# Patient Record
Sex: Female | Born: 1940
Health system: Southern US, Community
[De-identification: ages and names within clinical notes are randomized; demographics above are authoritative.]

## PROBLEM LIST (undated history)

## (undated) DIAGNOSIS — H269 Unspecified cataract: Secondary | ICD-10-CM

## (undated) DIAGNOSIS — N189 Chronic kidney disease, unspecified: Secondary | ICD-10-CM

## (undated) DIAGNOSIS — I10 Essential (primary) hypertension: Secondary | ICD-10-CM

## (undated) DIAGNOSIS — E785 Hyperlipidemia, unspecified: Secondary | ICD-10-CM

## (undated) DIAGNOSIS — T7840XA Allergy, unspecified, initial encounter: Secondary | ICD-10-CM

## (undated) DIAGNOSIS — D0590 Unspecified type of carcinoma in situ of unspecified breast: Secondary | ICD-10-CM

## (undated) DIAGNOSIS — J302 Other seasonal allergic rhinitis: Secondary | ICD-10-CM

## (undated) DIAGNOSIS — C50919 Malignant neoplasm of unspecified site of unspecified female breast: Secondary | ICD-10-CM

## (undated) DIAGNOSIS — N649 Disorder of breast, unspecified: Secondary | ICD-10-CM

## (undated) DIAGNOSIS — Z85819 Personal history of malignant neoplasm of unspecified site of lip, oral cavity, and pharynx: Secondary | ICD-10-CM

## (undated) DIAGNOSIS — Z9221 Personal history of antineoplastic chemotherapy: Secondary | ICD-10-CM

## (undated) DIAGNOSIS — Z9189 Other specified personal risk factors, not elsewhere classified: Secondary | ICD-10-CM

## (undated) DIAGNOSIS — C4490 Unspecified malignant neoplasm of skin, unspecified: Secondary | ICD-10-CM

## (undated) DIAGNOSIS — M1990 Unspecified osteoarthritis, unspecified site: Secondary | ICD-10-CM

## (undated) DIAGNOSIS — Z923 Personal history of irradiation: Secondary | ICD-10-CM

## (undated) HISTORY — PX: MOHS SURGERY: SHX181

## (undated) HISTORY — PX: TONSILLECTOMY AND ADENOIDECTOMY: SUR1326

## (undated) HISTORY — DX: Unspecified osteoarthritis, unspecified site: M19.90

## (undated) HISTORY — DX: Essential (primary) hypertension: I10

## (undated) HISTORY — DX: Personal history of malignant neoplasm of unspecified site of lip, oral cavity, and pharynx: Z85.819

## (undated) HISTORY — PX: TUBAL LIGATION: SHX77

## (undated) HISTORY — DX: Allergy, unspecified, initial encounter: T78.40XA

## (undated) HISTORY — PX: OTHER SURGICAL HISTORY: SHX169

## (undated) HISTORY — PX: BREAST EXCISIONAL BIOPSY: SUR124

## (undated) HISTORY — DX: Unspecified type of carcinoma in situ of unspecified breast: D05.90

## (undated) HISTORY — DX: Other specified personal risk factors, not elsewhere classified: Z91.89

## (undated) HISTORY — DX: Chronic kidney disease, unspecified: N18.9

## (undated) HISTORY — DX: Unspecified cataract: H26.9

## (undated) HISTORY — PX: DILATION AND CURETTAGE OF UTERUS: SHX78

## (undated) HISTORY — DX: Other seasonal allergic rhinitis: J30.2

## (undated) HISTORY — DX: Disorder of breast, unspecified: N64.9

## (undated) HISTORY — PX: BREAST SURGERY: SHX581

## (undated) HISTORY — DX: Personal history of antineoplastic chemotherapy: Z92.21

## (undated) HISTORY — DX: Unspecified malignant neoplasm of skin, unspecified: C44.90

## (undated) HISTORY — PX: ABDOMINAL HYSTERECTOMY: SHX81

---

## 1997-12-05 ENCOUNTER — Ambulatory Visit (HOSPITAL_COMMUNITY): Admission: RE | Admit: 1997-12-05 | Discharge: 1997-12-05 | Payer: Self-pay | Admitting: Obstetrics and Gynecology

## 1998-02-25 ENCOUNTER — Other Ambulatory Visit: Admission: RE | Admit: 1998-02-25 | Discharge: 1998-02-25 | Payer: Self-pay | Admitting: Obstetrics and Gynecology

## 1998-12-11 ENCOUNTER — Ambulatory Visit (HOSPITAL_COMMUNITY): Admission: RE | Admit: 1998-12-11 | Discharge: 1998-12-11 | Payer: Self-pay | Admitting: Internal Medicine

## 1998-12-17 ENCOUNTER — Encounter: Payer: Self-pay | Admitting: Internal Medicine

## 1998-12-17 ENCOUNTER — Ambulatory Visit (HOSPITAL_COMMUNITY): Admission: RE | Admit: 1998-12-17 | Discharge: 1998-12-17 | Payer: Self-pay | Admitting: Internal Medicine

## 1999-02-23 ENCOUNTER — Other Ambulatory Visit: Admission: RE | Admit: 1999-02-23 | Discharge: 1999-02-23 | Payer: Self-pay | Admitting: Obstetrics and Gynecology

## 1999-07-21 ENCOUNTER — Inpatient Hospital Stay (HOSPITAL_COMMUNITY): Admission: RE | Admit: 1999-07-21 | Discharge: 1999-07-22 | Payer: Self-pay | Admitting: Obstetrics and Gynecology

## 1999-07-21 ENCOUNTER — Encounter (INDEPENDENT_AMBULATORY_CARE_PROVIDER_SITE_OTHER): Payer: Self-pay

## 1999-12-23 ENCOUNTER — Ambulatory Visit (HOSPITAL_COMMUNITY): Admission: RE | Admit: 1999-12-23 | Discharge: 1999-12-23 | Payer: Self-pay | Admitting: Internal Medicine

## 1999-12-23 ENCOUNTER — Encounter: Payer: Self-pay | Admitting: Internal Medicine

## 2000-05-24 DIAGNOSIS — Z9221 Personal history of antineoplastic chemotherapy: Secondary | ICD-10-CM

## 2000-05-24 DIAGNOSIS — Z923 Personal history of irradiation: Secondary | ICD-10-CM

## 2000-05-24 DIAGNOSIS — D0590 Unspecified type of carcinoma in situ of unspecified breast: Secondary | ICD-10-CM

## 2000-05-24 HISTORY — PX: BREAST LUMPECTOMY: SHX2

## 2000-05-24 HISTORY — DX: Personal history of irradiation: Z92.3

## 2000-05-24 HISTORY — DX: Unspecified type of carcinoma in situ of unspecified breast: D05.90

## 2000-05-24 HISTORY — PX: BREAST BIOPSY: SHX20

## 2000-05-24 HISTORY — DX: Personal history of antineoplastic chemotherapy: Z92.21

## 2000-09-19 ENCOUNTER — Encounter: Payer: Self-pay | Admitting: Obstetrics and Gynecology

## 2000-09-19 ENCOUNTER — Encounter: Admission: RE | Admit: 2000-09-19 | Discharge: 2000-09-19 | Payer: Self-pay | Admitting: Obstetrics and Gynecology

## 2000-09-19 ENCOUNTER — Other Ambulatory Visit: Admission: RE | Admit: 2000-09-19 | Discharge: 2000-09-19 | Payer: Self-pay | Admitting: Obstetrics and Gynecology

## 2000-09-19 ENCOUNTER — Encounter (INDEPENDENT_AMBULATORY_CARE_PROVIDER_SITE_OTHER): Payer: Self-pay | Admitting: *Deleted

## 2000-09-23 ENCOUNTER — Encounter: Admission: RE | Admit: 2000-09-23 | Discharge: 2000-09-23 | Payer: Self-pay | Admitting: General Surgery

## 2000-09-23 ENCOUNTER — Encounter: Payer: Self-pay | Admitting: General Surgery

## 2000-09-26 ENCOUNTER — Encounter: Payer: Self-pay | Admitting: General Surgery

## 2000-09-26 ENCOUNTER — Ambulatory Visit (HOSPITAL_BASED_OUTPATIENT_CLINIC_OR_DEPARTMENT_OTHER): Admission: RE | Admit: 2000-09-26 | Discharge: 2000-09-26 | Payer: Self-pay | Admitting: General Surgery

## 2000-09-26 ENCOUNTER — Encounter (INDEPENDENT_AMBULATORY_CARE_PROVIDER_SITE_OTHER): Payer: Self-pay | Admitting: *Deleted

## 2000-10-05 ENCOUNTER — Ambulatory Visit: Admission: RE | Admit: 2000-10-05 | Discharge: 2000-12-21 | Payer: Self-pay | Admitting: Radiation Oncology

## 2000-11-02 ENCOUNTER — Encounter: Payer: Self-pay | Admitting: Oncology

## 2000-11-02 ENCOUNTER — Ambulatory Visit (HOSPITAL_COMMUNITY): Admission: RE | Admit: 2000-11-02 | Discharge: 2000-11-02 | Payer: Self-pay | Admitting: Oncology

## 2000-12-22 ENCOUNTER — Ambulatory Visit: Admission: RE | Admit: 2000-12-22 | Discharge: 2001-03-22 | Payer: Self-pay | Admitting: Radiation Oncology

## 2001-02-15 ENCOUNTER — Encounter: Admission: RE | Admit: 2001-02-15 | Discharge: 2001-02-15 | Payer: Self-pay | Admitting: Radiation Oncology

## 2001-02-16 ENCOUNTER — Encounter: Admission: RE | Admit: 2001-02-16 | Discharge: 2001-02-16 | Payer: Self-pay | Admitting: Radiation Oncology

## 2001-03-23 ENCOUNTER — Ambulatory Visit: Admission: RE | Admit: 2001-03-23 | Discharge: 2001-06-21 | Payer: Self-pay | Admitting: Radiation Oncology

## 2001-04-28 ENCOUNTER — Ambulatory Visit (HOSPITAL_COMMUNITY): Admission: RE | Admit: 2001-04-28 | Discharge: 2001-04-28 | Payer: Self-pay | Admitting: Oncology

## 2001-04-28 ENCOUNTER — Encounter: Payer: Self-pay | Admitting: Oncology

## 2001-10-19 LAB — HM COLONOSCOPY: HM Colonoscopy: NORMAL

## 2002-02-19 ENCOUNTER — Encounter: Admission: RE | Admit: 2002-02-19 | Discharge: 2002-02-19 | Payer: Self-pay | Admitting: Oncology

## 2002-02-19 ENCOUNTER — Encounter: Payer: Self-pay | Admitting: Oncology

## 2003-02-22 ENCOUNTER — Encounter: Admission: RE | Admit: 2003-02-22 | Discharge: 2003-02-22 | Payer: Self-pay | Admitting: Oncology

## 2003-02-22 ENCOUNTER — Encounter: Payer: Self-pay | Admitting: Oncology

## 2003-04-24 ENCOUNTER — Ambulatory Visit (HOSPITAL_COMMUNITY): Admission: RE | Admit: 2003-04-24 | Discharge: 2003-04-24 | Payer: Self-pay | Admitting: Oncology

## 2004-03-06 ENCOUNTER — Encounter: Admission: RE | Admit: 2004-03-06 | Discharge: 2004-03-06 | Payer: Self-pay | Admitting: General Surgery

## 2004-04-14 ENCOUNTER — Ambulatory Visit: Payer: Self-pay | Admitting: Internal Medicine

## 2004-06-10 ENCOUNTER — Ambulatory Visit: Payer: Self-pay | Admitting: Internal Medicine

## 2004-09-03 ENCOUNTER — Ambulatory Visit: Payer: Self-pay | Admitting: Internal Medicine

## 2004-09-15 ENCOUNTER — Ambulatory Visit: Payer: Self-pay | Admitting: Internal Medicine

## 2004-11-04 ENCOUNTER — Ambulatory Visit: Payer: Self-pay | Admitting: Internal Medicine

## 2004-12-14 ENCOUNTER — Ambulatory Visit: Payer: Self-pay | Admitting: Internal Medicine

## 2004-12-16 ENCOUNTER — Ambulatory Visit: Payer: Self-pay | Admitting: Internal Medicine

## 2004-12-18 ENCOUNTER — Ambulatory Visit: Payer: Self-pay | Admitting: Internal Medicine

## 2005-03-02 ENCOUNTER — Ambulatory Visit: Payer: Self-pay | Admitting: Oncology

## 2005-03-08 ENCOUNTER — Encounter: Admission: RE | Admit: 2005-03-08 | Discharge: 2005-03-08 | Payer: Self-pay | Admitting: Oncology

## 2005-03-18 ENCOUNTER — Encounter: Admission: RE | Admit: 2005-03-18 | Discharge: 2005-03-18 | Payer: Self-pay | Admitting: Oncology

## 2005-09-13 ENCOUNTER — Ambulatory Visit: Payer: Self-pay | Admitting: Internal Medicine

## 2005-09-16 ENCOUNTER — Ambulatory Visit: Payer: Self-pay | Admitting: Internal Medicine

## 2006-03-09 ENCOUNTER — Encounter: Admission: RE | Admit: 2006-03-09 | Discharge: 2006-03-09 | Payer: Self-pay | Admitting: General Surgery

## 2006-03-10 ENCOUNTER — Ambulatory Visit: Payer: Self-pay | Admitting: Internal Medicine

## 2006-03-10 ENCOUNTER — Ambulatory Visit: Payer: Self-pay | Admitting: Oncology

## 2006-03-14 ENCOUNTER — Ambulatory Visit (HOSPITAL_COMMUNITY): Admission: RE | Admit: 2006-03-14 | Discharge: 2006-03-14 | Payer: Self-pay | Admitting: Oncology

## 2006-03-14 ENCOUNTER — Ambulatory Visit: Payer: Self-pay | Admitting: Internal Medicine

## 2006-06-03 ENCOUNTER — Ambulatory Visit: Payer: Self-pay | Admitting: Oncology

## 2006-06-08 LAB — CBC WITH DIFFERENTIAL/PLATELET
BASO%: 0.5 % (ref 0.0–2.0)
Basophils Absolute: 0 10*3/uL (ref 0.0–0.1)
EOS%: 1.6 % (ref 0.0–7.0)
Eosinophils Absolute: 0.1 10*3/uL (ref 0.0–0.5)
HCT: 36.3 % (ref 34.8–46.6)
HGB: 12.4 g/dL (ref 11.6–15.9)
LYMPH%: 28.3 % (ref 14.0–48.0)
MCH: 31.3 pg (ref 26.0–34.0)
MCHC: 34.2 g/dL (ref 32.0–36.0)
MCV: 91.5 fL (ref 81.0–101.0)
MONO#: 0.5 10*3/uL (ref 0.1–0.9)
MONO%: 6.3 % (ref 0.0–13.0)
NEUT#: 4.9 10*3/uL (ref 1.5–6.5)
NEUT%: 63.3 % (ref 39.6–76.8)
Platelets: 274 10*3/uL (ref 145–400)
RBC: 3.97 10*6/uL (ref 3.70–5.32)
RDW: 13.2 % (ref 11.3–14.5)
WBC: 7.8 10*3/uL (ref 3.9–10.0)
lymph#: 2.2 10*3/uL (ref 0.9–3.3)

## 2006-06-09 LAB — COMPREHENSIVE METABOLIC PANEL
ALT: 10 U/L (ref 0–35)
AST: 14 U/L (ref 0–37)
Albumin: 4.4 g/dL (ref 3.5–5.2)
Alkaline Phosphatase: 49 U/L (ref 39–117)
BUN: 22 mg/dL (ref 6–23)
CO2: 25 mEq/L (ref 19–32)
Calcium: 9.5 mg/dL (ref 8.4–10.5)
Chloride: 108 mEq/L (ref 96–112)
Creatinine, Ser: 1.03 mg/dL (ref 0.40–1.20)
Glucose, Bld: 91 mg/dL (ref 70–99)
Potassium: 4 mEq/L (ref 3.5–5.3)
Sodium: 144 mEq/L (ref 135–145)
Total Bilirubin: 0.3 mg/dL (ref 0.3–1.2)
Total Protein: 6.9 g/dL (ref 6.0–8.3)

## 2006-06-09 LAB — CANCER ANTIGEN 27.29: CA 27.29: 37 U/mL (ref 0–39)

## 2006-07-14 ENCOUNTER — Ambulatory Visit: Payer: Self-pay | Admitting: Internal Medicine

## 2006-10-20 ENCOUNTER — Ambulatory Visit: Payer: Self-pay | Admitting: Internal Medicine

## 2007-02-14 ENCOUNTER — Ambulatory Visit: Payer: Self-pay | Admitting: Oncology

## 2007-02-16 LAB — CBC WITH DIFFERENTIAL/PLATELET
BASO%: 0.6 % (ref 0.0–2.0)
Basophils Absolute: 0 10*3/uL (ref 0.0–0.1)
EOS%: 2.6 % (ref 0.0–7.0)
Eosinophils Absolute: 0.2 10*3/uL (ref 0.0–0.5)
HCT: 36.5 % (ref 34.8–46.6)
HGB: 12.9 g/dL (ref 11.6–15.9)
LYMPH%: 29.7 % (ref 14.0–48.0)
MCH: 31.7 pg (ref 26.0–34.0)
MCHC: 35.4 g/dL (ref 32.0–36.0)
MCV: 89.5 fL (ref 81.0–101.0)
MONO#: 0.6 10*3/uL (ref 0.1–0.9)
MONO%: 8.7 % (ref 0.0–13.0)
NEUT#: 3.8 10*3/uL (ref 1.5–6.5)
NEUT%: 58.4 % (ref 39.6–76.8)
Platelets: 256 10*3/uL (ref 145–400)
RBC: 4.07 10*6/uL (ref 3.70–5.32)
RDW: 13.3 % (ref 11.3–14.5)
WBC: 6.5 10*3/uL (ref 3.9–10.0)
lymph#: 1.9 10*3/uL (ref 0.9–3.3)

## 2007-02-16 LAB — COMPREHENSIVE METABOLIC PANEL
ALT: 9 U/L (ref 0–35)
AST: 13 U/L (ref 0–37)
Albumin: 4.1 g/dL (ref 3.5–5.2)
Alkaline Phosphatase: 59 U/L (ref 39–117)
BUN: 17 mg/dL (ref 6–23)
CO2: 25 mEq/L (ref 19–32)
Calcium: 9.6 mg/dL (ref 8.4–10.5)
Chloride: 106 mEq/L (ref 96–112)
Creatinine, Ser: 0.96 mg/dL (ref 0.40–1.20)
Glucose, Bld: 80 mg/dL (ref 70–99)
Potassium: 4.2 mEq/L (ref 3.5–5.3)
Sodium: 140 mEq/L (ref 135–145)
Total Bilirubin: 0.5 mg/dL (ref 0.3–1.2)
Total Protein: 6.7 g/dL (ref 6.0–8.3)

## 2007-02-16 LAB — CANCER ANTIGEN 27.29: CA 27.29: 31 U/mL (ref 0–39)

## 2007-02-22 ENCOUNTER — Encounter: Payer: Self-pay | Admitting: Internal Medicine

## 2007-02-22 ENCOUNTER — Ambulatory Visit: Payer: Self-pay | Admitting: Internal Medicine

## 2007-02-22 DIAGNOSIS — B3784 Candidal otitis externa: Secondary | ICD-10-CM | POA: Insufficient documentation

## 2007-03-13 ENCOUNTER — Encounter: Admission: RE | Admit: 2007-03-13 | Discharge: 2007-03-13 | Payer: Self-pay | Admitting: Oncology

## 2007-03-17 ENCOUNTER — Encounter: Admission: RE | Admit: 2007-03-17 | Discharge: 2007-03-17 | Payer: Self-pay | Admitting: Oncology

## 2007-06-05 ENCOUNTER — Encounter: Payer: Self-pay | Admitting: *Deleted

## 2007-06-05 DIAGNOSIS — N840 Polyp of corpus uteri: Secondary | ICD-10-CM | POA: Insufficient documentation

## 2007-06-05 DIAGNOSIS — D0590 Unspecified type of carcinoma in situ of unspecified breast: Secondary | ICD-10-CM | POA: Insufficient documentation

## 2007-06-05 DIAGNOSIS — Z9089 Acquired absence of other organs: Secondary | ICD-10-CM | POA: Insufficient documentation

## 2007-06-05 DIAGNOSIS — Z85819 Personal history of malignant neoplasm of unspecified site of lip, oral cavity, and pharynx: Secondary | ICD-10-CM

## 2007-06-05 DIAGNOSIS — Z9889 Other specified postprocedural states: Secondary | ICD-10-CM | POA: Insufficient documentation

## 2007-06-05 DIAGNOSIS — Z862 Personal history of diseases of the blood and blood-forming organs and certain disorders involving the immune mechanism: Secondary | ICD-10-CM | POA: Insufficient documentation

## 2007-06-05 DIAGNOSIS — N809 Endometriosis, unspecified: Secondary | ICD-10-CM | POA: Insufficient documentation

## 2007-06-05 DIAGNOSIS — N3289 Other specified disorders of bladder: Secondary | ICD-10-CM | POA: Insufficient documentation

## 2007-06-05 DIAGNOSIS — Z9189 Other specified personal risk factors, not elsewhere classified: Secondary | ICD-10-CM | POA: Insufficient documentation

## 2007-06-05 DIAGNOSIS — Z8639 Personal history of other endocrine, nutritional and metabolic disease: Secondary | ICD-10-CM

## 2007-06-05 HISTORY — DX: Personal history of malignant neoplasm of unspecified site of lip, oral cavity, and pharynx: Z85.819

## 2007-12-18 ENCOUNTER — Ambulatory Visit: Payer: Self-pay | Admitting: Internal Medicine

## 2007-12-18 DIAGNOSIS — R234 Changes in skin texture: Secondary | ICD-10-CM | POA: Insufficient documentation

## 2008-03-14 ENCOUNTER — Encounter: Admission: RE | Admit: 2008-03-14 | Discharge: 2008-03-14 | Payer: Self-pay | Admitting: Oncology

## 2008-03-19 ENCOUNTER — Ambulatory Visit: Payer: Self-pay | Admitting: Oncology

## 2008-03-21 LAB — CBC WITH DIFFERENTIAL/PLATELET
BASO%: 0.4 % (ref 0.0–2.0)
Basophils Absolute: 0 10*3/uL (ref 0.0–0.1)
EOS%: 1.8 % (ref 0.0–7.0)
Eosinophils Absolute: 0.2 10*3/uL (ref 0.0–0.5)
HCT: 37.8 % (ref 34.8–46.6)
HGB: 13.2 g/dL (ref 11.6–15.9)
LYMPH%: 21.2 % (ref 14.0–48.0)
MCH: 31.5 pg (ref 26.0–34.0)
MCHC: 34.8 g/dL (ref 32.0–36.0)
MCV: 90.7 fL (ref 81.0–101.0)
MONO#: 0.6 10*3/uL (ref 0.1–0.9)
MONO%: 7.2 % (ref 0.0–13.0)
NEUT#: 6 10*3/uL (ref 1.5–6.5)
NEUT%: 69.4 % (ref 39.6–76.8)
Platelets: 272 10*3/uL (ref 145–400)
RBC: 4.17 10*6/uL (ref 3.70–5.32)
RDW: 13.6 % (ref 11.3–14.5)
WBC: 8.7 10*3/uL (ref 3.9–10.0)
lymph#: 1.8 10*3/uL (ref 0.9–3.3)

## 2008-03-21 LAB — COMPREHENSIVE METABOLIC PANEL
ALT: 10 U/L (ref 0–35)
AST: 15 U/L (ref 0–37)
Albumin: 4.1 g/dL (ref 3.5–5.2)
Alkaline Phosphatase: 62 U/L (ref 39–117)
BUN: 17 mg/dL (ref 6–23)
CO2: 25 mEq/L (ref 19–32)
Calcium: 9.8 mg/dL (ref 8.4–10.5)
Chloride: 105 mEq/L (ref 96–112)
Creatinine, Ser: 0.89 mg/dL (ref 0.40–1.20)
Glucose, Bld: 83 mg/dL (ref 70–99)
Potassium: 4.4 mEq/L (ref 3.5–5.3)
Sodium: 139 mEq/L (ref 135–145)
Total Bilirubin: 0.4 mg/dL (ref 0.3–1.2)
Total Protein: 6.6 g/dL (ref 6.0–8.3)

## 2008-03-21 LAB — CANCER ANTIGEN 27.29: CA 27.29: 43 U/mL — ABNORMAL HIGH (ref 0–39)

## 2008-04-23 ENCOUNTER — Encounter: Payer: Self-pay | Admitting: Internal Medicine

## 2008-07-26 ENCOUNTER — Encounter: Payer: Self-pay | Admitting: Internal Medicine

## 2008-08-27 ENCOUNTER — Encounter: Payer: Self-pay | Admitting: Internal Medicine

## 2008-08-27 ENCOUNTER — Ambulatory Visit: Payer: Self-pay | Admitting: Internal Medicine

## 2008-09-16 ENCOUNTER — Ambulatory Visit: Payer: Self-pay | Admitting: Endocrinology

## 2008-09-16 DIAGNOSIS — R05 Cough: Secondary | ICD-10-CM | POA: Insufficient documentation

## 2008-12-19 ENCOUNTER — Ambulatory Visit: Payer: Self-pay | Admitting: Internal Medicine

## 2009-01-14 ENCOUNTER — Telehealth: Payer: Self-pay | Admitting: Internal Medicine

## 2009-02-06 ENCOUNTER — Ambulatory Visit: Payer: Self-pay | Admitting: Internal Medicine

## 2009-02-06 LAB — CONVERTED CEMR LAB
BUN: 19 mg/dL (ref 6–23)
CO2: 29 meq/L (ref 19–32)
Calcium: 9.5 mg/dL (ref 8.4–10.5)
Chloride: 108 meq/L (ref 96–112)
Creatinine, Ser: 1 mg/dL (ref 0.4–1.2)
GFR calc non Af Amer: 58.67 mL/min (ref >60)
Glucose, Bld: 93 mg/dL (ref 70–99)
Potassium: 4.4 meq/L (ref 3.5–5.1)
Sodium: 145 meq/L (ref 135–145)

## 2009-02-11 ENCOUNTER — Telehealth: Payer: Self-pay | Admitting: Internal Medicine

## 2009-03-17 ENCOUNTER — Encounter: Admission: RE | Admit: 2009-03-17 | Discharge: 2009-03-17 | Payer: Self-pay | Admitting: Oncology

## 2009-03-28 ENCOUNTER — Ambulatory Visit: Payer: Self-pay | Admitting: Oncology

## 2009-04-01 ENCOUNTER — Encounter: Payer: Self-pay | Admitting: Internal Medicine

## 2009-04-01 LAB — CBC WITH DIFFERENTIAL/PLATELET
BASO%: 0.5 % (ref 0.0–2.0)
Basophils Absolute: 0 10*3/uL (ref 0.0–0.1)
EOS%: 1 % (ref 0.0–7.0)
Eosinophils Absolute: 0.1 10*3/uL (ref 0.0–0.5)
HCT: 39.1 % (ref 34.8–46.6)
HGB: 13.2 g/dL (ref 11.6–15.9)
LYMPH%: 26.2 % (ref 14.0–49.7)
MCH: 31.6 pg (ref 25.1–34.0)
MCHC: 33.8 g/dL (ref 31.5–36.0)
MCV: 93.5 fL (ref 79.5–101.0)
MONO#: 0.7 10*3/uL (ref 0.1–0.9)
MONO%: 8.4 % (ref 0.0–14.0)
NEUT#: 5.2 10*3/uL (ref 1.5–6.5)
NEUT%: 63.9 % (ref 38.4–76.8)
Platelets: 300 10*3/uL (ref 145–400)
RBC: 4.18 10*6/uL (ref 3.70–5.45)
RDW: 13.6 % (ref 11.2–14.5)
WBC: 8.2 10*3/uL (ref 3.9–10.3)
lymph#: 2.1 10*3/uL (ref 0.9–3.3)

## 2009-04-01 LAB — COMPREHENSIVE METABOLIC PANEL
ALT: 9 U/L (ref 0–35)
AST: 16 U/L (ref 0–37)
Albumin: 4.5 g/dL (ref 3.5–5.2)
Alkaline Phosphatase: 57 U/L (ref 39–117)
BUN: 22 mg/dL (ref 6–23)
CO2: 26 mEq/L (ref 19–32)
Calcium: 10 mg/dL (ref 8.4–10.5)
Chloride: 106 mEq/L (ref 96–112)
Creatinine, Ser: 1.08 mg/dL (ref 0.40–1.20)
Glucose, Bld: 85 mg/dL (ref 70–99)
Potassium: 4 mEq/L (ref 3.5–5.3)
Sodium: 141 mEq/L (ref 135–145)
Total Bilirubin: 0.5 mg/dL (ref 0.3–1.2)
Total Protein: 6.8 g/dL (ref 6.0–8.3)

## 2009-04-28 ENCOUNTER — Telehealth: Payer: Self-pay | Admitting: Internal Medicine

## 2009-12-23 ENCOUNTER — Ambulatory Visit: Payer: Self-pay | Admitting: Internal Medicine

## 2009-12-23 LAB — CONVERTED CEMR LAB
ALT: 12 units/L (ref 0–35)
AST: 18 units/L (ref 0–37)
Albumin: 4 g/dL (ref 3.5–5.2)
Alkaline Phosphatase: 55 units/L (ref 39–117)
BUN: 23 mg/dL (ref 6–23)
Basophils Absolute: 0.1 10*3/uL (ref 0.0–0.1)
Basophils Relative: 0.7 % (ref 0.0–3.0)
Bilirubin, Direct: 0.1 mg/dL (ref 0.0–0.3)
CO2: 27 meq/L (ref 19–32)
Calcium: 10.2 mg/dL (ref 8.4–10.5)
Chloride: 111 meq/L (ref 96–112)
Cholesterol: 243 mg/dL — ABNORMAL HIGH (ref 0–200)
Creatinine, Ser: 0.9 mg/dL (ref 0.4–1.2)
Direct LDL: 143.5 mg/dL
Eosinophils Absolute: 0.1 10*3/uL (ref 0.0–0.7)
Eosinophils Relative: 1.2 % (ref 0.0–5.0)
GFR calc non Af Amer: 70.59 mL/min (ref >60)
Glucose, Bld: 90 mg/dL (ref 70–99)
HCT: 37.6 % (ref 36.0–46.0)
HDL: 77 mg/dL (ref >39.00)
Hemoglobin: 12.8 g/dL (ref 12.0–15.0)
Hgb A1c MFr Bld: 5.6 % (ref 4.6–6.5)
Lymphocytes Relative: 23.6 % (ref 12.0–46.0)
Lymphs Abs: 2.3 10*3/uL (ref 0.7–4.0)
MCHC: 34 g/dL (ref 30.0–36.0)
MCV: 92.1 fL (ref 78.0–100.0)
Monocytes Absolute: 0.7 10*3/uL (ref 0.1–1.0)
Monocytes Relative: 7.3 % (ref 3.0–12.0)
Neutro Abs: 6.5 10*3/uL (ref 1.4–7.7)
Neutrophils Relative %: 67.2 % (ref 43.0–77.0)
Platelets: 281 10*3/uL (ref 150.0–400.0)
Potassium: 5 meq/L (ref 3.5–5.1)
RBC: 4.08 M/uL (ref 3.87–5.11)
RDW: 13.5 % (ref 11.5–14.6)
Sodium: 145 meq/L (ref 135–145)
TSH: 2.3 microintl units/mL (ref 0.35–5.50)
Total Bilirubin: 0.4 mg/dL (ref 0.3–1.2)
Total CHOL/HDL Ratio: 3
Total Protein: 6.7 g/dL (ref 6.0–8.3)
Triglycerides: 144 mg/dL (ref 0.0–149.0)
VLDL: 28.8 mg/dL (ref 0.0–40.0)
WBC: 9.6 10*3/uL (ref 4.5–10.5)

## 2010-01-07 ENCOUNTER — Ambulatory Visit: Payer: Self-pay | Admitting: Internal Medicine

## 2010-03-17 ENCOUNTER — Encounter: Admission: RE | Admit: 2010-03-17 | Discharge: 2010-03-17 | Payer: Self-pay | Admitting: Oncology

## 2010-03-17 LAB — HM MAMMOGRAPHY: HM Mammogram: NEGATIVE

## 2010-03-18 ENCOUNTER — Telehealth: Payer: Self-pay | Admitting: Internal Medicine

## 2010-04-06 ENCOUNTER — Ambulatory Visit: Payer: Self-pay | Admitting: Oncology

## 2010-05-06 ENCOUNTER — Ambulatory Visit: Payer: Self-pay | Admitting: Oncology

## 2010-05-07 ENCOUNTER — Encounter: Payer: Self-pay | Admitting: Internal Medicine

## 2010-05-07 LAB — CBC WITH DIFFERENTIAL/PLATELET
BASO%: 0.5 % (ref 0.0–2.0)
Basophils Absolute: 0 10*3/uL (ref 0.0–0.1)
EOS%: 1.4 % (ref 0.0–7.0)
Eosinophils Absolute: 0.1 10*3/uL (ref 0.0–0.5)
HCT: 37.8 % (ref 34.8–46.6)
HGB: 12.9 g/dL (ref 11.6–15.9)
LYMPH%: 29.3 % (ref 14.0–49.7)
MCH: 31.4 pg (ref 25.1–34.0)
MCHC: 34.2 g/dL (ref 31.5–36.0)
MCV: 91.8 fL (ref 79.5–101.0)
MONO#: 0.5 10*3/uL (ref 0.1–0.9)
MONO%: 8.1 % (ref 0.0–14.0)
NEUT#: 4 10*3/uL (ref 1.5–6.5)
NEUT%: 60.7 % (ref 38.4–76.8)
Platelets: 261 10*3/uL (ref 145–400)
RBC: 4.11 10*6/uL (ref 3.70–5.45)
RDW: 13.5 % (ref 11.2–14.5)
WBC: 6.7 10*3/uL (ref 3.9–10.3)
lymph#: 1.9 10*3/uL (ref 0.9–3.3)

## 2010-05-07 LAB — COMPREHENSIVE METABOLIC PANEL
ALT: 12 U/L (ref 0–35)
AST: 19 U/L (ref 0–37)
Albumin: 4.1 g/dL (ref 3.5–5.2)
Alkaline Phosphatase: 59 U/L (ref 39–117)
BUN: 21 mg/dL (ref 6–23)
CO2: 27 mEq/L (ref 19–32)
Calcium: 9.7 mg/dL (ref 8.4–10.5)
Chloride: 107 mEq/L (ref 96–112)
Creatinine, Ser: 0.9 mg/dL (ref 0.40–1.20)
Glucose, Bld: 87 mg/dL (ref 70–99)
Potassium: 4.2 mEq/L (ref 3.5–5.3)
Sodium: 142 mEq/L (ref 135–145)
Total Bilirubin: 0.4 mg/dL (ref 0.3–1.2)
Total Protein: 6.7 g/dL (ref 6.0–8.3)

## 2010-05-07 LAB — CANCER ANTIGEN 27.29: CA 27.29: 42 U/mL — ABNORMAL HIGH (ref 0–39)

## 2010-06-23 NOTE — Progress Notes (Signed)
Summary: Diltiazem rx for mail order  Phone Note Call from Patient   Summary of Call: Pt takes Diltiazem HCL 180mg  and has been getting it locally but would now like to use [facility]. Please give pt 90 day supply with 4 refills for [facility] Mail order. Pt can be reached at [phone]. Initial call taken by: Lucious Groves CMA,  April 28, 2009 9:56 AM  Follow-up for Phone Call        OK for 90 days supply to [facility] Follow-up by: Jacques Navy MD,  April 28, 2009 1:10 PM    Prescriptions: CARTIA XT 180 MG XR24H-CAP (DILTIAZEM HCL COATED BEADS) 1 once daily  #90 x 3   Entered by:   Ami Bullins CMA   Authorized by:   Jacques Navy MD   Signed by:   Bill Salinas CMA on 04/28/2009   Method used:   Electronically to        [facility] (mail-order)             ,          Ph: [phone]       Fax: [phone]   RxID:   [id number]

## 2010-06-23 NOTE — Progress Notes (Signed)
    Immunization History:  Influenza Immunization History:    Influenza:  .5ml right deltoid mfg. sanofi pasteur lot # [id number] (03/05/2010)

## 2010-06-23 NOTE — Assessment & Plan Note (Signed)
Summary: cpx / medicare / labs after ov/cd   Vital Signs:  Patient profile:   70 year old female Height:      64 inches Weight:      153.50 pounds BMI:     26.44 O2 Sat:      97 % on Room air Temp:     97.8 degrees F oral Pulse rate:   61 / minute BP sitting:   162 / 80  (left arm) Cuff size:   regular  Vitals Entered By: Beola Cord, CMA (December 19, 2008 1:33 PM)  O2 Flow:  Room air CC: cpe Comments D/C Keflex D/C Benzonatate   Primary Care Provider:  Adreena Willits  CC:  cpe.  History of Present Illness: Patient presents for medical follow-up. I last saw her in July '09. Only seen once during the year - April '10 saw Dr. Sherrilyn Rist for URI  - with good results with antibiotic and tessalon perles.  Feels good. BP up today but chart review shows that the SBP has been 122-132 on the last two visits.   Saw Dr. Tresa Res in March '10 - normal exam, lipid panel with LDL 142, HDL 68.  She saw Dr. Darnelle Catalan in Nov '09 and had a normal exam. Normal Mammogram in [facility] '09.  Preventive Screening-Counseling & Management  Alcohol-Tobacco     Alcohol drinks/day: 0     Smoking Status: quit > 6 months     Year Quit: 1970  Caffeine-Diet-Exercise     Caffeine use/day: 2     Diet Comments: heart healthy     Diet Counseling: not indicated; diet is assessed to be healthy     Does Patient Exercise: yes     Type of exercise: walking      Exercise (avg: min/session): 30-60     Times/week: 3  Current Medications (verified): 1)  Aspirin 81 Mg  Tabs (Aspirin) .... Take One Tablet Once Daily 2)  Vesicare 5 Mg  Tabs (Solifenacin Succinate) .... Take One Tablet Once Daily 3)  Evista 60 Mg  Tabs (Raloxifene Hcl) .... Take One Tablet Once Daily 4)  Glucosamine-Chondroitin 1500-1200 Mg/61ml  Liqd (Glucosamine-Chondroitin) .... Take Two Tablet Once Daily 5)  Caltrate 600+d 600-400 Mg-Unit  Tabs (Calcium Carbonate-Vitamin D) .... Take Two Tablet Once Daily 6)  Loratadine 10 Mg  Tabs (Loratadine) .... Take  One Tablet Once Daily As Needed 7)  Vitamin D 16109 Unit  Caps (Ergocalciferol) .... Take One Tab By Mouth Every Other Week 8)  Keflex 250 Mg Caps (Cephalexin) .Marland Kitchen.. 1 Tid 9)  Benzonatate 100 Mg Caps (Benzonatate) .Marland Kitchen.. 1 Three Times A Day As Needed Cough  Allergies (verified): 1)  ! Sulfa 2)  ! * Armidex  Past History:  Past Medical History: Last updated: 12/18/2007 HYPOGLYCEMIA, HX OF (ICD-V12.2) Hx of DUCTAL CARCINOMA IN SITU, LEFT BREAST (ICD-233.0) Hx of ENDOMETRIAL POLYP (ICD-621.0) IRRITABLE BLADDER (ICD-596.8) RADIATION THERAPY, HX OF (ICD-V15.9) Hx of ENDOMETRIOSIS (ICD-617.9) CARCINOMA, SQUAMOUS CELL, HX OF (ICD-V10.9) CANDIDAL OTITIS EXTERNA (UEA-540.98)     Physician roster:                 gyn - Dr. Vida Rigger                 onc - Dr. Darnelle Catalan                 GS- Dr. Maple Hudson (retired)                 opthal - Dr.  Nile Riggs  Past Surgical History: Last updated: 06/05/2007 DILATION AND CURETTAGE, HX OF (ICD-V45.89) ARTHROSCOPY, LEFT KNEE, HX OF (ICD-V45.89) HYSTERECTOMY, HX OF AND RIGHT SALPINGOOOPHORECTOMY (ICD-V45.77) * LEFT BREAST LUMPECTOMY WITH LEFT AXILLARY SENTINAL NODE * RIGHT BREAST LUMPECTOMY TUBAL LIGATION, HX OF (ICD-V26.51) * SKIN CANCER REMOVED FROM RIGHT ARM (SQUAMOUS CELL) TONSILLECTOMY AND ADENOIDECTOMY, HX OF (ICD-V45.79)     Family History: Last updated: 12/18/2007 father- 65; macular degeneration, CAD/CABG @ 64, AODM mother- 32; CAD/CABG @ 58,  Neg- Breast or colon cancer  Social History: Last updated: 12/18/2007 HSG, [employer] community college - 2 year degree married '60 2 boys - '62, '73; 11 grandchildren; 1 great-grandchildren retired, marriage in good health.  Risk Factors: Alcohol Use: 0 (12/19/2008) Caffeine Use: 2 (12/19/2008) Diet: heart healthy (12/19/2008) Exercise: yes (12/19/2008)  Risk Factors: Smoking Status: quit > 6 months (12/19/2008)  Social History: Smoking Status:  quit > 6 months Caffeine use/day:  2   Review of Systems  The patient denies anorexia, fever, weight loss, weight gain, vision loss, decreased hearing, hoarseness, chest pain, syncope, dyspnea on exertion, peripheral edema, prolonged cough, headaches, hemoptysis, abdominal pain, melena, hematochezia, severe indigestion/heartburn, hematuria, incontinence, genital sores, muscle weakness, suspicious skin lesions, transient blindness, difficulty walking, depression, unusual weight change, enlarged lymph nodes, angioedema, and breast masses.    Physical Exam  General:  alert, well-developed, well-nourished, well-hydrated, and normal appearance.   Head:  normocephalic, atraumatic, and no abnormalities observed.   Eyes:  vision grossly intact, pupils equal, pupils round, corneas and lenses clear, no injection, and no optic disk abnormalities.   Ears:  External ear exam shows no significant lesions or deformities.  Otoscopic examination reveals clear canals, tympanic membranes are intact bilaterally without bulging, retraction, inflammation or discharge. Hearing is grossly normal bilaterally. Nose:  no external deformity and no external erythema.   Mouth:  Oral mucosa and oropharynx without lesions or exudates.  Teeth in good repair. Neck:  full ROM, no thyromegaly, and no carotid bruits.   Breasts:  deferred Lungs:  normal respiratory effort, no intercostal retractions, normal breath sounds, and no wheezes.   Heart:  normal rate, regular rhythm, no JVD, no HJR, and physiological split S2.   Abdomen:  soft, non-tender, normal bowel sounds, no masses, no rigidity, and no hepatomegaly.   Rectal:  deferred Genitalia:  deferred Msk:  normal ROM, no joint tenderness, no joint swelling, no joint deformities, and no joint instability.   Pulses:  2+ radial, 2+ DP Extremities:  No clubbing, cyanosis, edema, or deformity noted with normal full range of motion of all joints.   Neurologic:  alert & oriented X3, cranial nerves II-XII intact, strength  normal in all extremities, gait normal, and DTRs symmetrical and normal.   Skin:  turgor normal, color normal, no rashes, no suspicious lesions, no ecchymoses, no petechiae, and no ulcerations.   Cervical Nodes:  no anterior cervical adenopathy and no posterior cervical adenopathy.   Psych:  Oriented X3, memory intact for recent and remote, normally interactive, and good eye contact.     Impression & Recommendations:  Problem # 1:  Hx of DUCTAL CARCINOMA IN SITU, LEFT BREAST (ICD-233.0) Stable and doing well.   Problem # 2:  IRRITABLE BLADDER (ICD-596.8) Reports that she is doing OK. Continues on vesicare.  Problem # 3:  Preventive Health Care (ICD-V70.0) Current with gyn; current with breast exams and mammography; normal limited exam. Outside labs reviewed with no need for additional labs today.  In summary: a very nice woman  who is medically stable and doing well.  Complete Medication List: 1)  Aspirin 81 Mg Tabs (Aspirin) .... Take one tablet once daily 2)  Vesicare 5 Mg Tabs (Solifenacin succinate) .... Take one tablet once daily 3)  Evista 60 Mg Tabs (Raloxifene hcl) .... Take one tablet once daily 4)  Glucosamine-chondroitin 1500-1200 Mg/85ml Liqd (Glucosamine-chondroitin) .... Take two tablet once daily 5)  Caltrate 600+d 600-400 Mg-unit Tabs (Calcium carbonate-vitamin d) .... Take two tablet once daily 6)  Loratadine 10 Mg Tabs (Loratadine) .... Take one tablet once daily as needed 7)  Vitamin D 91478 Unit Caps (Ergocalciferol) .... Take one tab by mouth every other week 8)  Keflex 250 Mg Caps (Cephalexin) .Marland Kitchen.. 1 tid 9)  Benzonatate 100 Mg Caps (Benzonatate) .Marland Kitchen.. 1 three times a day as needed cough   Preventive Care Screening  Mammogram:    Date:  03/07/2008    Results:  normal

## 2010-06-23 NOTE — Assessment & Plan Note (Signed)
   Vital Signs:  Patient Profile:   70 Years Old Female Height:     63 inches Weight:      155 pounds Temp:     99.3 degrees F oral Pulse rate:   76 / minute BP sitting:   117 / 77  (left arm)                 PCP:  Aza Dantes   History of Present Illness: pt is well known to me, a cancer survivor.  She reports that her right ear is itching and uncomfortable. She has tried normal home remedies, but continues to have discomfort. No fever, chills, no loss of hearing , no tinnitus.  Current Allergies: ! SULFA ! * ARMIDEX      Physical Exam  General:     Well-developed,well-nourished,in no acute distress; alert,appropriate and cooperative throughout examination Ears:     EAC's and TM's normal  bilaterally. In the right eac just inside the ear the skin is irritated with a small amount of scale    Impression & Recommendations:  Problem # 1:  CANDIDAL OTITIS EXTERNA (ICD-112.82) no evidence of severe infection. The skin gives the appearance of a minor fungal infection.  Plan: lotrisone cream to the affected area three times a day.   Complete Medication List: 1)  Bufferin Low Dose 81 Mg Tbec (Aspirin) .... Once daily 2)  Vesicare 5 Mg Tabs (Solifenacin succinate) .... Once daily 3)  Evista 60 Mg Tabs (Raloxifene hcl) .... Once daily 4)  Glucosamine-chondroitin 1500-1200 Mg/36ml Liqd (Glucosamine-chondroitin) .... Once daily 5)  Caltrate 600+d 600-400 Mg-unit Tabs (Calcium carbonate-vitamin d) .... Once daily 6)  Loratadine 10 Mg Tabs (Loratadine) .... Once daily as needed     ]

## 2010-06-23 NOTE — Progress Notes (Signed)
Summary: Enalapril side effect  Phone Note Call from Patient Call back at Work Phone [phone]   Summary of Call: Pt has been on enalapril and has 2 pills left. She has developed a hacking cough. Her pharmacist told pt that this was a side effect of the medication. She wanted to know what Dr Debby Bud thinks about this.  Initial call taken by: Lamar Sprinkles, CMA,  February 11, 2009 10:22 AM  Follow-up for Phone Call        Yep, will try ditia XT 180 mg by mouth once daily # 30, refill as needed. Follow-up by: Jacques Navy MD,  February 11, 2009 3:34 PM  Additional Follow-up for Phone Call Additional follow up Details #1::        Pt informed  Additional Follow-up by: Lamar Sprinkles, CMA,  February 11, 2009 3:52 PM    New/Updated Medications: CARTIA XT 180 MG XR24H-CAP (DILTIAZEM HCL COATED BEADS) 1 once daily Prescriptions: CARTIA XT 180 MG XR24H-CAP (DILTIAZEM HCL COATED BEADS) 1 once daily  #30 x 12   Entered by:   Lamar Sprinkles, CMA   Authorized by:   Jacques Navy MD   Signed by:   Lamar Sprinkles, CMA on 02/11/2009   Method used:   Electronically to        [facility]* (retail)       [address]       [place], [place]  [address]       Ph: [phone] or [phone]       Fax: [phone]   RxID:   [id number]

## 2010-06-23 NOTE — Assessment & Plan Note (Signed)
Summary: Jessica Woodward /[facility]   Vital Signs:  Patient profile:   70 year old female Height:      64 inches Weight:      154 pounds BMI:     26.53 Temp:     97.7 degrees F oral Pulse rate:   88 / minute BP sitting:   132 / 78  (right arm) Cuff size:   regular  Vitals Entered By: Bill Salinas CMA (September 16, 2008 10:16 AM) CC: Woodward c/o coughing, runny nose and sore throat x 2 weeks with no fever/ab   Primary Provider:  Norins  CC:  Woodward c/o coughing and runny nose and sore throat x 2 weeks with no fever/ab.  History of Present Illness: 1 month of nasal congestion, and associated slight pain at the right side of the nose.  also myalgias, painful dry cough, and earache.  Current Medications (verified): 1)  Aspirin 81 Mg  Tabs (Aspirin) .... Take One Tablet Once Daily 2)  Vesicare 5 Mg  Tabs (Solifenacin Succinate) .... Take One Tablet Once Daily 3)  Evista 60 Mg  Tabs (Raloxifene Hcl) .... Take One Tablet Once Daily 4)  Glucosamine-Chondroitin 1500-1200 Mg/37ml  Liqd (Glucosamine-Chondroitin) .... Take Two Tablet Once Daily 5)  Caltrate 600+d 600-400 Mg-Unit  Tabs (Calcium Carbonate-Vitamin D) .... Take Two Tablet Once Daily 6)  Loratadine 10 Mg  Tabs (Loratadine) .... Take One Tablet Once Daily As Needed 7)  Vitamin D 04540 Unit  Caps (Ergocalciferol) .... Take One Tab By Mouth Every Other Week  Allergies (verified): 1)  ! Sulfa 2)  ! * Armidex  Past History:  Past Medical History:    HYPOGLYCEMIA, HX OF (ICD-V12.2)    Hx of DUCTAL CARCINOMA IN SITU, LEFT BREAST (ICD-233.0)    Hx of ENDOMETRIAL POLYP (ICD-621.0)    IRRITABLE BLADDER (ICD-596.8)    RADIATION THERAPY, HX OF (ICD-V15.9)    Hx of ENDOMETRIOSIS (ICD-617.9)    CARCINOMA, SQUAMOUS CELL, HX OF (ICD-V10.9)    CANDIDAL OTITIS EXTERNA (JWJ-191.47)         Physician roster:                    gyn - Dr. Vida Rigger                    onc - Dr. Darnelle Catalan                    GS- Dr. Maple Hudson (retired)                    opthal -  Dr. Nile Riggs (12/18/2007)  Review of Systems  The patient denies fever and dyspnea on exertion.    Physical Exam  General:  normal appearance.   Head:  head: no deformity eyes: no periorbital swelling, no proptosis external nose and ears are normal mouth: no lesion seen  Nose:  slight tendernessa t the left side of the nose, but no erythema or swelling Neck:  Supple without thyroid enlargement or tenderness. No cervical lymphadenopathy, neck masses or tracheal deviation.  Lungs:  Clear to auscultation bilaterally. Normal respiratory effort.  Additional Exam:  cxr: nad dexa: normal   Impression & Recommendations:  Problem # 1:  COUGH (ICD-786.2) prob due to #3, or uri, or both  Problem # 2:  menopause but no evidence of osteoporosis  Problem # 3:  allergic rhinitis  Medications Added to Medication List This Visit: 1)  Keflex 250 Mg Caps (  Cephalexin) .Marland Kitchen.. 1 tid 2)  Benzonatate 100 Mg Caps (Benzonatate) .Marland Kitchen.. 1 three times a day as needed cough  Other Orders: T-2 View CXR, Same Day (71020.5TC) Est. Patient Level IV (16109)  Patient Instructions: 1)  i told Woodward her bone density is normal 2)  continue the claritin 3)  cephalexin 250 mg two times a day 4)  benzonatate 100 mg three times a day as needed cough 5)  nasonex 2 sprays each side once daily Prescriptions: BENZONATATE 100 MG CAPS (BENZONATATE) 1 three times a day as needed cough  #21 x 0   Entered and Authorized by:   Minus Breeding MD   Signed by:   Minus Breeding MD on 09/16/2008   Method used:   Electronically to        [facility] (retail)       [address]       [place], [place]  [address]       Ph: [phone] or [phone]       Fax: [phone]   RxID:   [id number] KEFLEX 250 MG CAPS (CEPHALEXIN) 1 tid  #21 x 0   Entered and Authorized by:   Minus Breeding MD   Signed by:   Minus Breeding MD on 09/16/2008   Method used:   Electronically to        [facility]* (retail)       [address]       [place], [place]  [address]       Ph: [phone] or [phone]       Fax: [phone]   RxID:   [id number]

## 2010-06-23 NOTE — Assessment & Plan Note (Signed)
Summary: tetanus booster/peumonia shot-lb  Nurse Visit   Allergies: 1)  ! Sulfa 2)  ! * Armidex  Immunizations Administered:  Tetanus Vaccine:    Vaccine Type: Td    Site: left deltoid    Mfr: Sanofi Pasteur    Dose: 0.5 ml    Route: IM    Given by: Lamar Sprinkles, CMA    Exp. Date: 06/25/2011    Lot #: [id number]    VIS given: 04/11/07 version given January 07, 2010.  Pneumonia Vaccine:    Vaccine Type: Pneumovax    Site: right deltoid    Mfr: Merck    Dose: 0.5 ml    Route: IM    Given by: Lamar Sprinkles, CMA    Exp. Date: 06/14/2011    Lot #: [id number]    VIS given: 12/20/95 version given January 07, 2010.  Orders Added: 1)  TD Toxoids IM 7 YR + [90714] 2)  Pneumococcal Vaccine [90732] 3)  Admin 1st Vaccine [90471] 4)  Admin of Any Addtl Vaccine [11914]

## 2010-06-23 NOTE — Miscellaneous (Signed)
Summary: BONE DENSITY  Clinical Lists Changes  Orders: Added new Test order of T-Bone Densitometry (77080) - Signed Added new Test order of T-Lumbar Vertebral Assessment (77082) - Signed 

## 2010-06-23 NOTE — Assessment & Plan Note (Signed)
Summary: YEARLY FU/ MEDICARE/ LABS LATER/NWS   Vital Signs:  Patient profile:   70 year old female Height:      64 inches Weight:      158 pounds BMI:     27.22 O2 Sat:      96 % on Room air Temp:     97.8 degrees F oral Pulse rate:   67 / minute BP sitting:   128 / 82  (left arm) Cuff size:   regular  Vitals Entered By: Bill Salinas CMA 01-11-2010 1:17 PM)  O2 Flow:  Room air CC: cpx/ab Is Patient Diabetic? No Comments pt not taking Vesicare or Vit D/ ab  Vision Screening:      Vision Comments: Pt's last eye exam was Sept 2010 with Dr Nile Riggs. Normal eye exam   Primary Care Provider:  Cahlil Sattar  CC:  cpx/ab.  History of Present Illness: Presents for routine follow-up. She has had a good year with no new medical problems. She has stopped Vesicare due to dryness. She stopped high dose Vit D. She is feeling well. She is participating in an exercise challenge and doing well.  Preventive Screening-Counseling & Management  Alcohol-Tobacco     Alcohol drinks/day: 0     Smoking Status: never  Caffeine-Diet-Exercise     Diet Comments: healthy diet     Diet Counseling: no indicated     Does Patient Exercise: yes     Type of exercise: walking, exercise     Exercise (avg: min/session): 30-60     Times/week: 5     Exercise Counseling: not indicated; exercise is adequate     [facility] Depression Score: no evidence of depression  Current Medications (verified): 1)  Aspirin 81 Mg  Tabs (Aspirin) .... Take One Tablet Once Daily 2)  Vesicare 5 Mg  Tabs (Solifenacin Succinate) .... Take One Tablet Once Daily 3)  Evista 60 Mg  Tabs (Raloxifene Hcl) .... Take One Tablet Once Daily 4)  Glucosamine-Chondroitin 1500-1200 Mg/39ml  Liqd (Glucosamine-Chondroitin) .... Take Two Tablet Once Daily 5)  Caltrate 600+d 600-400 Mg-Unit  Tabs (Calcium Carbonate-Vitamin D) .... Take Two Tablet Once Daily 6)  Loratadine 10 Mg  Tabs (Loratadine) .... Take One Tablet Once Daily As Needed 7)  Vitamin  D 50000 Unit  Caps (Ergocalciferol) .... Take One Tab By Mouth Every Other Week 8)  Keflex 250 Mg Caps (Cephalexin) .Marland Kitchen.. 1 Tid 9)  Benzonatate 100 Mg Caps (Benzonatate) .Marland Kitchen.. 1 Three Times A Day As Needed Cough 10)  Cartia Xt 180 Mg Xr24h-Cap (Diltiazem Hcl Coated Beads) .Marland Kitchen.. 1 Once Daily  Allergies (verified): 1)  ! Sulfa 2)  ! 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  Risk Factors: Alcohol Use: 0 (2010-01-11) Caffeine Use: 2 (12/19/2008) Diet: healthy diet (11-Jan-2010) Exercise: yes (01-11-10)  Risk Factors: Smoking Status: never (11-Jan-2010)  Past Medical History: HYPOGLYCEMIA, HX OF (ICD-V12.2) Hx of DUCTAL CARCINOMA IN SITU, LEFT BREAST (ICD-233.0) Hx of ENDOMETRIAL POLYP (ICD-621.0) IRRITABLE BLADDER (ICD-596.8) RADIATION THERAPY, HX OF (ICD-V15.9) Hx of ENDOMETRIOSIS (ICD-617.9) CARCINOMA, SQUAMOUS CELL, HX OF (ICD-V10.9)      Physician roster:  gyn - Dr. Vida Rigger                 onc - Dr. Darnelle Catalan                 GS- Dr. Maple Hudson (retired)                 opthal - Dr. Nile Riggs  Past Surgical History: DILATION AND CURETTAGE, HX OF (ICD-V45.89) ARTHROSCOPY, LEFT KNEE, HX OF (ICD-V45.89) HYSTERECTOMY, HX OF AND RIGHT SALPINGOOOPHORECTOMY (ICD-V45.77) * LEFT BREAST LUMPECTOMY WITH LEFT AXILLARY SENTINAL NODE * RIGHT BREAST LUMPECTOMY-rremote TUBAL LIGATION, HX OF (ICD-V26.51) * SKIN CANCER REMOVED FROM RIGHT ARM (SQUAMOUS CELL) TONSILLECTOMY AND ADENOIDECTOMY, HX OF (ICD-V45.79)     Family History: father- deceased @ 60; macular degeneration, CAD/CABG @ 11, AODM mother- 51; CAD/CABG @ 53,  Neg-  Breast or colon cancer  Social History: HSG, [employer] community [employer] - 2 year degree married '60 2 boys - '62, '73; 11 grandchildren; 3 great-grandchildren retired, marriage in good health. Smoking Status:  never  Review of Systems       The patient complains of weight gain.  The patient denies anorexia, fever, weight loss, vision loss, decreased hearing, hoarseness, chest pain, syncope, dyspnea on exertion, peripheral edema, headaches, abdominal pain, severe indigestion/heartburn, incontinence, muscle weakness, transient blindness, difficulty walking, abnormal bleeding, enlarged lymph nodes, and angioedema.         no bladder porlbems off vesicare. She is exercising at least 5/7 days.   Physical Exam  General:  WNWD white female in no distress who appears healthy Head:  Normocephalic and atraumatic without obvious abnormalities. No apparent alopecia or balding. Eyes:  vision grossly intact, pupils equal, pupils round, corneas and lenses clear, and no injection.   Ears:  External ear exam shows no significant lesions or deformities.  Otoscopic examination reveals clear canals, tympanic membranes are intact bilaterally without bulging, retraction, inflammation or discharge. Hearing is grossly normal bilaterally. Nose:  no external deformity and no external erythema.   Mouth:  Oral mucosa and oropharynx without lesions or exudates.  Teeth in good repair. Neck:  supple, full ROM, no thyromegaly, and no carotid bruits.   Chest Wall:  No deformities, masses, or tenderness noted. Breasts:  deferred Lungs:  Normal respiratory effort, chest expands symmetrically. Lungs are clear to auscultation, no crackles or wheezes. Heart:  Normal rate and regular rhythm. S1 and S2 normal without gallop, murmur, click, rub or other extra sounds. Abdomen:  soft, non-tender, normal bowel sounds, no distention, no guarding, and no hepatomegaly.   Genitalia:  deferred to Gyn Msk:  normal ROM, no  joint tenderness, no joint swelling, no joint warmth, and no redness over joints.   Pulses:  2+ radial and DP Extremities:  No clubbing, cyanosis, edema, or deformity noted with normal full range of motion of all joints.   Neurologic:  alert & oriented X3, cranial nerves II-XII intact, strength normal in all extremities, gait normal, and DTRs symmetrical and normal.   Skin:  turgor normal, color normal, no rashes, no petechiae, and no ulcerations.   Cervical Nodes:  no anterior cervical adenopathy and no posterior cervical adenopathy.   Psych:  Oriented X3, memory intact for recent and remote, normally interactive, and good eye contact.     Impression & Recommendations:  Problem # 1:  Hx of DUCTAL CARCINOMA IN SITU, LEFT BREAST (ICD-233.0) Stable and doing very well. Oncology notes reviewed. Last mammogram reviewed.  Plan - routine mammogram in October  Orders: TLB-Lipid Panel (80061-LIPID) TLB-Hepatic/Liver Function Pnl (80076-HEPATIC) TLB-TSH (Thyroid Stimulating Hormone) (84443-TSH)  Problem # 2:  IRRITABLE BLADDER (ICD-596.8) Patient has stopped medication due to "dryness." She is getting along OK without any medication.  Problem # 3:  Preventive Health Care (ICD-V70.0) Uneventful recent history. Exam is normal. Lab work is normal, including normal A1C. HDL cholesterol is excellent; LDL is above goal of 130 but below NCEP recommended treatment threshold. Her LDL/HDL ratio is protective. She is current with colorectal cancer screening. Her immunizations are up to date.  Patient is active and engaged with no signs of depression. She is fully independent in all activities of daily living. She has no fall or injury risk. She has a robust exercise program.  In summary - a delightful woman who is medically stable. She will return as needed or 1 year.   Complete Medication List: 1)  Aspirin 81 Mg Tabs (Aspirin) .... Take one tablet once daily 2)  Vesicare 5 Mg Tabs (Solifenacin  succinate) .... Take one tablet once daily 3)  Evista 60 Mg Tabs (Raloxifene hcl) .... Take one tablet once daily 4)  Glucosamine-chondroitin 1500-1200 Mg/34ml Liqd (Glucosamine-chondroitin) .... Take two tablet once daily 5)  Caltrate 600+d 600-400 Mg-unit Tabs (Calcium carbonate-vitamin d) .... Take two tablet once daily 6)  Loratadine 10 Mg Tabs (Loratadine) .... Take one tablet once daily as needed 7)  Vitamin D 64332 Unit Caps (Ergocalciferol) .... Take one tab by mouth every other week 8)  Keflex 250 Mg Caps (Cephalexin) .Marland Kitchen.. 1 tid 9)  Benzonatate 100 Mg Caps (Benzonatate) .Marland Kitchen.. 1 three times a day as needed cough 10)  Cartia Xt 180 Mg Xr24h-cap (Diltiazem hcl coated beads) .Marland Kitchen.. 1 once daily  Other Orders: TLB-BMP (Basic Metabolic Panel-BMET) (80048-METABOL) TLB-A1C / Hgb A1C (Glycohemoglobin) (83036-A1C) TLB-CBC Platelet - w/Differential (85025-CBCD)   t: Keelan Dyar Note: All result statuses are Final unless otherwise noted.  Tests: (1) BMP (METABOL)   Sodium                    145 mEq/L                   135-145   Potassium                 5.0 mEq/L                   3.5-5.1   Chloride                  111 mEq/L                   96-112   Carbon Dioxide            27 mEq/L                    19-32   Glucose                   90 mg/dL                    95-18   BUN                       23 mg/dL                    8-41   Creatinine  0.9 mg/dL                   4.0-9.8   Calcium                   10.2 mg/dL                  1.1-91.4   GFR                       70.59 mL/min                >60  Tests: (2) Hemoglobin A1C (A1C)   Hemoglobin A1C            5.6 %                       4.6-6.5     Glycemic Control Guidelines for People with Diabetes:     Non Diabetic:  <6%     Goal of Therapy: <7%     Additional Action Suggested:  >8%   Tests: (3) Lipid Panel (LIPID)   Cholesterol          [H]  243 mg/dL                   7-829     ATP III Classification             Desirable:  < 200 mg/dL                    Borderline High:  200 - 239 mg/dL               High:  > = 240 mg/dL   Triglycerides             144.0 mg/dL                 5.6-213.0     Normal:  <150 mg/dL     Borderline High:  865 - 199 mg/dL   HDL                       78.46 mg/dL                 >96.29   VLDL Cholesterol          28.8 mg/dL                  5.2-84.1  CHO/HDL Ratio:  CHD Risk                             3                    Men          Women     1/2 Average Risk     3.4          3.3     Average Risk          5.0          4.4     2X Average Risk          9.6          7.1     3X Average Risk          15.0          11.0  Tests: (4) Hepatic/Liver Function Panel (HEPATIC)   Total Bilirubin           0.4 mg/dL                   1.6-1.0   Direct Bilirubin          0.1 mg/dL                   9.6-0.4   Alkaline Phosphatase      55 U/L                      39-117   AST                       18 U/L                      0-37   ALT                       12 U/L                      0-35   Total Protein             6.7 g/dL                    5.4-0.9   Albumin                   4.0 g/dL                    8.1-1.9  Tests: (5) CBC Platelet w/Diff (CBCD)   White Cell Count          9.6 K/uL                    4.5-10.5   Red Cell Count            4.08 Mil/uL                 3.87-5.11   Hemoglobin                12.8 g/dL                   14.7-82.9   Hematocrit                37.6 %                      36.0-46.0   MCV                       92.1 fl                     78.0-100.0   MCHC                      34.0 g/dL                   56.2-13.0   RDW                       13.5 %                      11.5-14.6   Platelet Count  281.0 K/uL                  150.0-400.0   Neutrophil %              67.2 %                      43.0-77.0   Lymphocyte %              23.6 %                      12.0-46.0   Monocyte %                7.3 %                        3.0-12.0   Eosinophils%              1.2 %                       0.0-5.0   Basophils %               0.7 %                       0.0-3.0   Neutrophill Absolute      6.5 K/uL                    1.4-7.7   Lymphocyte Absolute       2.3 K/uL                    0.7-4.0   Monocyte Absolute         0.7 K/uL                    0.1-1.0  Eosinophils, Absolute                             0.1 K/uL                    0.0-0.7   Basophils Absolute        0.1 K/uL                    0.0-0.1  Tests: (6) TSH (TSH)   FastTSH                   2.30 uIU/mL                 0.35-5.50  Tests: (7) Cholesterol LDL - Direct (DIRLDL)  Cholesterol LDL - Direct                             143.5 mg/dL     Optimal:  <161 mg/dL     Near or Above Optimal:  100-129 mg/dL     Borderline High:  096-045 mg/dL     High:  409-811 mg/dL     Very High:  >914 mg/dL  Preventive Care Screening  Last Flu Shot:    Date:  03/17/2009    Results:  given   Bone Density:    Date:  09/16/2008    Results:  normal std dev  Last Pneumovax:    Date:  08/23/2004    Results:  given  Last Tetanus Booster:    Date:  07/08/1998    Results:  Historical      Prevention & Chronic Care Immunizations   Influenza vaccine: given  (03/17/2009)   Influenza vaccine due: 01/22/2010    Tetanus booster: 07/08/1998: Historical    Pneumococcal vaccine: given  (08/23/2004)    H. zoster vaccine: 03/14/2006: Zostavax  Colorectal Screening   Hemoccult: Not documented    Colonoscopy: Normal  (10/19/2001)   Colonoscopy due: 10/20/2011  Other Screening   Pap smear: Not documented    Mammogram: ASSESSMENT: Negative - BI-RADS 1^MM DIGITAL SCREENING  (03/17/2009)    DXA bone density scan: normal  (09/16/2008)   Smoking status: never  (12/23/2009)

## 2010-06-23 NOTE — Letter (Signed)
Summary: [facility]  [facility]   Imported By: Lester Orogrande 04/22/2009 08:27:16  _____________________________________________________________________  External Attachment:    Type:   Image     Comment:   External Document

## 2010-06-23 NOTE — Assessment & Plan Note (Signed)
Summary: YEARLY FU/LABS AFTER/AWARE OF FEE/NML   Vital Signs:  Patient Profile:   70 Years Old Female Height:     63 inches Weight:      157 pounds Temp:     98.5 degrees F oral Pulse rate:   64 / minute BP sitting:   122 / 82  (right arm) Cuff size:   regular  Vitals Entered By: Zackery Barefoot CMA (December 18, 2007 1:24 PM)                 PCP:  Jettie Lazare  Chief Complaint:  Annual visit for disease management.  History of Present Illness: For routine followp-up. She has been followed By Dr. Maple Hudson, now retired, and Dr. Darnelle Catalan with last visit in Nov '08. She is on an annual schedule at this time.  Weird spots on the arm- in the axilla.      Updated Prior Medication List: ASPIRIN 81 MG  TABS (ASPIRIN) Take one tablet once daily VESICARE 5 MG  TABS (SOLIFENACIN SUCCINATE) Take one tablet once daily EVISTA 60 MG  TABS (RALOXIFENE HCL) Take one tablet once daily GLUCOSAMINE-CHONDROITIN 1500-1200 MG/30ML  LIQD (GLUCOSAMINE-CHONDROITIN) Take two tablet once daily CALTRATE 600+D 600-400 MG-UNIT  TABS (CALCIUM CARBONATE-VITAMIN D) Take two tablet once daily LORATADINE 10 MG  TABS (LORATADINE) Take one tablet once daily as needed VITAMIN D 16109 UNIT  CAPS (ERGOCALCIFEROL) Take one tab by mouth every other week  Current Allergies: ! SULFA ! * ARMIDEX  Past Medical History:    Reviewed history from 06/05/2007 and no changes required:       HYPOGLYCEMIA, HX OF (ICD-V12.2)       Hx of DUCTAL CARCINOMA IN SITU, LEFT BREAST (ICD-233.0)       Hx of ENDOMETRIAL POLYP (ICD-621.0)       IRRITABLE BLADDER (ICD-596.8)       RADIATION THERAPY, HX OF (ICD-V15.9)       Hx of ENDOMETRIOSIS (ICD-617.9)       CARCINOMA, SQUAMOUS CELL, HX OF (ICD-V10.9)       CANDIDAL OTITIS EXTERNA (UEA-540.98)                             Physician roster:                       gyn - Dr. Vida Rigger                       onc - Dr. Darnelle Catalan                       GS- Dr. Maple Hudson (retired)    opthal - Dr. 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   Risk Factors:  HIV high-risk behavior:  no Alcohol use:  yes    Type:  very rarely    Counseled to quit/cut down alcohol use:  no Exercise:  yes    Times per week:  7    Type:  low impact aerobics, walkking Seatbelt use:  100 %  Mammogram History:     Date of Last Mammogram:  03/13/2007    Results:  Normal Bilateral   Colonoscopy History:     Date of Last Colonoscopy:  10/19/2001    Results:  Normal    Review of Systems  The patient denies anorexia, fever, weight loss, weight gain, vision loss, decreased hearing, hoarseness, chest pain, syncope, dyspnea on exertion, peripheral edema, prolonged cough, headaches, hemoptysis, abdominal pain, melena, hematochezia, severe indigestion/heartburn, hematuria, incontinence, genital sores, muscle weakness, suspicious skin lesions, transient blindness, difficulty walking, depression, unusual weight change, abnormal bleeding, enlarged lymph nodes, angioedema, and breast masses.     Physical Exam  General:     Well-developed,well-nourished,in no acute distress; alert,appropriate and cooperative throughout examination Head:     Normocephalic and atraumatic without obvious abnormalities. No apparent alopecia or balding. Eyes:     No corneal or conjunctival inflammation noted. EOMI. Perrla. Funduscopic exam benign, without hemorrhages, exudates or papilledema. Vision grossly normal. Ears:     External ear exam shows no significant lesions or deformities.  Otoscopic examination reveals clear canals, tympanic membranes are intact bilaterally without bulging, retraction, inflammation or  discharge. Hearing is grossly normal bilaterally. Nose:     no external deformity.   Mouth:     Oral mucosa and oropharynx without lesions or exudates.  Teeth in good repair. Neck:     No deformities, masses, or tenderness noted. Breasts:     deferred Lungs:     normal respiratory effort, normal breath sounds, and no wheezes.   Heart:     normal rate, regular rhythm, and no murmur.   Abdomen:     soft, non-tender, and normal bowel sounds.   Rectal:     deferred to gyn Genitalia:     deferred to gyn Msk:     normal ROM, no joint tenderness, no joint swelling, no joint warmth, no redness over joints, and no crepitation.   Pulses:     R and L carotid,radial,femoral,dorsalis pedis and posterior tibial pulses are full and equal bilaterally Neurologic:     alert & oriented X3, cranial nerves II-XII intact, strength normal in all extremities, gait normal, and DTRs symmetrical and normal.   Skin:       turgor normal.  Change in pigmentation in both axillar without appearance of bruising, No itch or discomfort.  Cervical Nodes:     No lymphadenopathy noted Axillary Nodes:     No palpable lymphadenopathy Psych:     memory intact for recent and remote, normally interactive, good eye contact, and not anxious appearing.      Impression & Recommendations:  Problem # 1:  Hx of DUCTAL CARCINOMA IN SITU, LEFT BREAST (ICD-233.0) stable and doing well. She has had breast exam by Dr. Tresa Res and Dr. Darnelle Catalan. She has had mammo and breast MRI. No evidence of recurrent disease.  Problem # 2:  IRRITABLE BLADDER (ICD-596.8) excellent response to Vesicare. She has mild xerostomia.  Plan: continue present meds.  Problem # 3:  CHANGES IN SKIN TEXTURE (ICD-782.8) patient with increase skin pigmentation in both axilla-right greater than left. The coloration is brown. There is a smooth edge.   Plan: watchful waiting: patient to notifiy me if there is progressive change. she is also to bring this to  the attention of her dermatologist at her next visit.  Problem # 4:  Preventive Health Care (ICD-V70.0) Current for Gyn and GI screening.  She was found by Dr. Tresa Res to be Vit D insufficient and had  12(?) weeks of Vit D at 50,000 units wkly and is now taking 50,000 units biweekly for maintenance with follow-up scheduled in about 6 months. She also takes calcium with vit D.  In summary  a lovely woman who appears healthy. Her labs from '07 were reviewed: LDL was 124. No need for repeat lab at this time.  She will return as needed or 1 year.  Complete Medication List: 1)  Aspirin 81 Mg Tabs (Aspirin) .... Take one tablet once daily 2)  Vesicare 5 Mg Tabs (Solifenacin succinate) .... Take one tablet once daily 3)  Evista 60 Mg Tabs (Raloxifene hcl) .... Take one tablet once daily 4)  Glucosamine-chondroitin 1500-1200 Mg/72ml Liqd (Glucosamine-chondroitin) .... Take two tablet once daily 5)  Caltrate 600+d 600-400 Mg-unit Tabs (Calcium carbonate-vitamin d) .... Take two tablet once daily 6)  Loratadine 10 Mg Tabs (Loratadine) .... Take one tablet once daily as needed 7)  Vitamin D 14782 Unit Caps (Ergocalciferol) .... Take one tab by mouth every other week    Prescriptions: VESICARE 5 MG  TABS (SOLIFENACIN SUCCINATE) Take one tablet once daily  #90 x 3   Entered and Authorized by:   Jacques Navy MD   Signed by:   Jacques Navy MD on 12/18/2007   Method used:   Faxed to ...       [facility]             ,          Ph:        Fax: [phone]   RxID:   [id number]  ]  Appended Document: YEARLY FU/LABS AFTER/AWARE OF FEE/NML Mailed to pt per Dr. Debby Bud.

## 2010-06-23 NOTE — Progress Notes (Signed)
Summary: BP  Phone Note Call from Patient   Summary of Call: At last office visit pt's bp was elevated. After checking her bp over the last 3 wks she feels that it is still elevated. Patient is requesting to know if dr would prescribe med or if she needs to come in for office visit?  Initial call taken by: Lamar Sprinkles, CMA,  January 14, 2009 12:07 PM  Follow-up for Phone Call        Rx for enalapril 5mg  once a day sent to her pharmacy. She will need to have metabolic panel 995.20/401.9 in 3 weeks. Follow-up by: Jacques Navy MD,  January 15, 2009 6:49 AM  Additional Follow-up for Phone Call Additional follow up Details #1::        called pt and made aware of info and pt states will come in to lab sept 16 or 17th... info in idx Additional Follow-up by: Windell Norfolk,  January 15, 2009 11:19 AM    New/Updated Medications: ENALAPRIL MALEATE 5 MG TABS (ENALAPRIL MALEATE) 1 by mouth once daily for BP Prescriptions: ENALAPRIL MALEATE 5 MG TABS (ENALAPRIL MALEATE) 1 by mouth once daily for BP  #30 x 12   Entered and Authorized by:   Jacques Navy MD   Signed by:   Jacques Navy MD on 01/15/2009   Method used:   Electronically to        [facility] (retail)       [address]       [place], [place]  [address]       Ph: [phone] or [phone]       Fax: [phone]   RxID:   [id number]

## 2010-06-23 NOTE — Letter (Signed)
Summary: [facility]  [facility]   Imported By: Esmeralda Links D'jimraou 05/16/2008 14:42:05  _____________________________________________________________________  External Attachment:    Type:   Image     Comment:   External Document

## 2010-06-25 NOTE — Letter (Signed)
Summary: [facility]  [facility]   Imported By: Sherian Rein 05/19/2010 15:33:13  _____________________________________________________________________  External Attachment:    Type:   Image     Comment:   External Document

## 2010-10-06 NOTE — Assessment & Plan Note (Signed)
[facility]                           PRIMARY CARE OFFICE NOTE   Jessica Woodward                       MRN:          [id number]  DATE:10/20/2006                            DOB:          Jun 12, 1940    HISTORY OF PRESENT ILLNESS:  Jessica Woodward is a delightful 70 year old woman  well known to me followed for multiple medical problems.  She is a  breast cancer survivor and doing well.  She has been followed by Dr.  Darnelle Catalan and presently has been taken off of Tamoxifen, is on Evista  only.   The patient was last seen in the office September 16, 2005, and in the  interval has done well with no new medical complaints of problems.   PAST MEDICAL HISTORY:  Well documented in my note of September 15, 2004,  with no significant changes or additions to her medical history.   CURRENT MEDICATIONS:  1. Aspirin 81 mg daily.  2. Calcium daily.  3. Multivitamin daily.  4. Vesicare 5 mg daily.  5. Glucosamine daily.  6. Evista 60 mg daily.   REVIEW OF SYSTEMS:  Negative for any constitutional, cardiovascular,  respiratory, GI or GU problems.  Her last eye exam was 18 months ago.   INTERVAL/SOCIAL HISTORY:  The patient had a son who got married, and  also she had a grandson who got married.  Her family is doing well.  She  does get to spend time with her local grandchild, and she goes out to  [place] to see her son and grandchildren and keep up with their  various activities including several of whom are in college.   PHYSICAL EXAMINATION:  VITAL SIGNS:  Temperature 98, blood pressure  147/93, pulse 65, weight 155.  GENERAL APPEARANCE:  This is a well-developed, well-nourished, well-  groomed woman who looks younger than her stated chronologic age in no  acute distress.  HEENT:  Normocephalic, atraumatic.  EACs and TMs were normal.  Oropharynx with native dentition in good repair.  No buccal or palatal  lesions were noted.  Posterior pharynx was clear.   Conjunctivae and  sclerae were clear.  PERRLA.  EOMI.  Funduscopic exam was unremarkable.  NECK:  Supple without thyromegaly.  NODES:  No adenopathy was noted in the cervical, supraclavicular  regions.  CHEST:  No CVA tenderness or deformity.  LUNGS:  Clear with no rales, wheezes or rhonchi.  BREAST:  Deferred to Gynecology/Oncology.  CARDIOVASCULAR:  Radial pulse 2+.  No JVD or carotid bruits.  She had a  quiet precordium with regular rate and rhythm without murmurs, rubs or  gallops.  ABDOMEN:  Soft.  No guarding or rebound.  No organosplenomegaly was  noted.  GENITALIA/RECTAL:  Deferred.  EXTREMITIES:  Without cyanosis, clubbing, edema or deformity.  NEUROLOGICAL:  Normal.   CHART REVIEW:  The patient's last colonoscopy was May 2003.  She is due  for follow up in 2013.  Last bone density study was July 14, 2006,  with a T score at the PA spine of -0.57, hip was normal at 0.287.  Left  femoral neck mildly negative at 0.41.   Last mammogram March 09, 2006, showed appropriate changes from  lumpectomy, but otherwise, unremarkable.  MRI was recommended in 2008.   LABORATORY DATA:  Last laboratory performed September 13, 2005, revealed a  normal CBC.  Cholesterol was normal at total of 194 and HDL 60.7 and LDL  of 121.  General chemistries were normal with a serum glucose of 99.  Liver functions were normal.  Renal function was normal.  TSH was  normal.  Given the patient's excellent health, no change in her medical  condition or diet, regular exercise, no indication for repeat laboratory  at this time.  She does have other laboratory follow up on a regular  basis by Dr. Darnelle Catalan.   ASSESSMENT/PLAN:  1. Oncology.  The patient is a breast cancer survivor and doing well.      She will continue on Evista and routine follow up with Dr.      Darnelle Catalan.  2. Irritable bladder.  The patient is doing very well with Vesicare,      and she will continue the same.  3. Health maintenance.   The patient is current, stable with no need      for further evaluation at this time.  Her surgeon, Francina Ames, is      retiring.  I have advised her that with Dr. Darnelle Catalan seeing her      routinely and Dr.  Tresa Res seeing her routinely, I doubt that she      needs to have regular surgical evaluation.   SUMMARY:  A wonderful woman who seems to be doing well at this time.  She will return to see me on an as needed basis.     Rosalyn Gess Norins, MD  Electronically Signed    MEN/MedQ  DD: 10/21/2006  DT: 10/21/2006  Job #: [id number]   cc:   Hinda Lenis P. Romine, M.D.  Valentino Hue. Magrinat, M.D.

## 2010-10-09 NOTE — Op Note (Signed)
[facility]  Patient:    Jessica Woodward, Jessica Woodward                 MRN: [id number] Proc. Date: 09/26/00 Adm. Date:  [id number] Attending:  Janalyn Rouse CC:         Rosalyn Gess. Norins, M.D. [facility]  Laqueta Linden, M.D.   Operative Report  PREOPERATIVE DIAGNOSIS:  Carcinoma of the left breast.  POSTOPERATIVE DIAGNOSIS:  Carcinoma of the left breast.  OPERATION: 1. Blue dye injection. 2. Left axillary sentinel lymph node biopsy. 3. Left partial mastectomy.  SURGEON:  Rose Phi. Maple Hudson, M.D.  ASSISTANT:  Andrey Spearman, M.D.  ANESTHESIA:  General.  DESCRIPTION OF PROCEDURE:  This patient had presented with a palpable mass at the 4 oclock position of the left breast.  Needle core biopsy had shown invasive ductal carcinoma.  Prior to coming to the operating room, the patient had 1 millicurie of technetium sulfur colloid injected intradermally in the periareolar area.  After suitable general anesthesia was induced, the patient was placed in the supine position with the left arm extended on the arm board; 5 cc of Lymphazurin blue was injected in the subareolar tissue and the breast massaged for 5 minutes.  The breast, shoulder, and axilla were then prepped and draped in the usual fashion.  Scanning with the NeoProbe revealed one hot spot in the left axilla. A short transverse left axillary incision was made with dissection through the subcutaneous tissue to the clavipectoral fascia.  Self-retaining retractors were inserted.  One could then see the blue dye underneath the clavipectoral fascia with blue afferent and efferent lymphatics.  This was mobilized out, and a fairly large lymph node that was both blue and hot was removed by clipping the lymphatics and cauterizing other tissue.  Upon removing it, there were no other hot or blue nodes and no palpable nodes.  Node was submitted to the pathologist for evaluation of touch preps.  While that  was being done, a curvilinear incision over the palpable mass at the 4 oclock position was made including an ellipse of skin.  We then widely excised the palpable tumor using the cautery.  Specimen was then oriented for the pathologist.  Grossly, it appeared to be completely rounded, although I felt like it was a little close in the 3 to 5 oclock position.  We then excised some more tissue there.  While that was being evaluated, we closed the incision with 4-0 Monocryl and Steri-Strips.  Axillary incision was also closed with 3-0 Vicryl and 4-0 Monocryl and Steri-Strips.  Touch prep of the lymph node was negative.  Touch prep of the margins was clean.  Dressings were applied and the patient transferred to the recovery room in satisfactory condition having tolerated the procedure well. DD:  09/26/00 TD:  09/26/00 Job: [id number]

## 2010-10-14 ENCOUNTER — Encounter: Payer: Self-pay | Admitting: Internal Medicine

## 2010-10-14 ENCOUNTER — Ambulatory Visit (INDEPENDENT_AMBULATORY_CARE_PROVIDER_SITE_OTHER)
Admission: RE | Admit: 2010-10-14 | Discharge: 2010-10-14 | Disposition: A | Discharge status: Home / Self Care | Payer: Medicare Other | Source: Ambulatory Visit | Attending: Internal Medicine | Admitting: Internal Medicine

## 2010-10-14 ENCOUNTER — Ambulatory Visit (INDEPENDENT_AMBULATORY_CARE_PROVIDER_SITE_OTHER): Payer: Medicare Other | Admitting: Internal Medicine

## 2010-10-14 VITALS — BP 132/80 | HR 70 | Temp 98.6°F | Ht 64.0 in | Wt 159.8 lb

## 2010-10-14 DIAGNOSIS — M25572 Pain in left ankle and joints of left foot: Secondary | ICD-10-CM

## 2010-10-14 DIAGNOSIS — M7752 Other enthesopathy of left foot: Secondary | ICD-10-CM

## 2010-10-14 DIAGNOSIS — M25579 Pain in unspecified ankle and joints of unspecified foot: Secondary | ICD-10-CM

## 2010-10-14 DIAGNOSIS — M659 Synovitis and tenosynovitis, unspecified: Secondary | ICD-10-CM

## 2010-10-14 MED ORDER — DICLOFENAC SODIUM 75 MG PO TBEC: 75.0000 mg | DELAYED_RELEASE_TABLET | Freq: Two times a day (BID) | ORAL | Status: DC

## 2010-10-14 NOTE — Progress Notes (Signed)
  Subjective:    Patient ID: Jessica Woodward, female    DOB: 17-Aug-1940, 70 y.o.   MRN: [id number]  HPI  Pt of my partner MEN, seeing me today for LLE swelling Describes as localized swelling of left ankle over lateral side Onset 1 week ago Precipitated by ?mild trauma playing freezbe Mild throbbing of distal LLE below knee but no selling or pain in calf Does not affect gait or ability to wear shoes No SOB, CP or hx clots  Past Medical History  Diagnosis Date  . DUCTAL CARCINOMA IN SITU, LEFT BREAST 2002    s/p left lumpectomy, xrt and tamoxifen  . CARCINOMA, SQUAMOUS CELL, HX OF 06/05/2007  . RADIATION THERAPY, HX OF      Review of Systems  Constitutional: Negative for fever.  Respiratory: Negative for cough and shortness of breath.   Cardiovascular: Negative for chest pain and palpitations.       Objective:   Physical Exam BP 132/80  Pulse 70  Temp(Src) 98.6 F (37 C) (Oral)  Ht 5\' 4"  (1.626 m)  Wt 159 lb 12.8 oz (72.485 kg)  BMI 27.43 kg/m2  SpO2 97% Physical Exam  Constitutional: She is oriented to person, place, and time. She appears well-developed and well-nourished. No distress. nontoxic Cardiovascular: Normal rate, regular rhythm and normal heart sounds.  No murmur heard. No BLE edema. Pulmonary/Chest: Effort normal and breath sounds normal. No respiratory distress. She has no wheezes.  Musculoskeletal: soft tissue swelling over lateral malleolus left ankle. Tender to palpation over ATF. Normal range of motion, no joint effusions. Skin: Skin is warm and dry. No rash noted. No erythema or bruising over ankle.  Psychiatric: She has a normal mood and affect. Her behavior is normal. Judgment and thought content normal.   Lab Results  Component Value Date   WBC 9.6 12/23/2009   HGB 12.9 05/07/2010   HCT 37.8 05/07/2010   PLT 261 05/07/2010   CHOL 243* 12/23/2009   TRIG 144.0 12/23/2009   HDL 77.00 12/23/2009   LDLDIRECT 143.5 12/23/2009   ALT 12 05/07/2010   AST 19  05/07/2010   NA 142 05/07/2010   K 4.2 05/07/2010   CL 107 05/07/2010   CREATININE 0.90 05/07/2010   BUN 21 05/07/2010   CO2 27 05/07/2010   TSH 2.30 12/23/2009   HGBA1C 5.6 12/23/2009        Assessment & Plan:  L ankle swelling, precipitated by overuse and possible injury/strain - suspect tendonitis vs sprain but check xray rule out bony abn. DDx could include DVT but no swelling of calf, pain or warmth - localized to ankle swelling only. tx NSAIDS bid x 7 days, ice and elevation for tendonitis/mild strain -call if unimproved, sooner if worse - pt agrees to same

## 2010-10-14 NOTE — Patient Instructions (Signed)
It was good to see you today. Xray test(s) ordered today. Your results will be called to you after review (within 48-72hours after test completion). If any changes need to be made, you will be notified at that time. Will use diclofenac for antiinflammatory and pain relief to treat your ankle tendonitis (causing the ache and swelling) - Your prescription(s) have been submitted to your pharmacy. Please take as directed and contact our office if you believe you are having problem(s) with the medication(s). Use ice and elevate ankle as much as possible next 7-10days or until symptoms improved, wear supportive shoes as discussed If swelling/pain worse, or unimproved with treatment as described here, call for other evaluation/treatment as needed

## 2010-10-15 ENCOUNTER — Encounter: Payer: Self-pay | Admitting: Internal Medicine

## 2010-12-17 ENCOUNTER — Other Ambulatory Visit: Payer: Self-pay | Admitting: Internal Medicine

## 2010-12-28 ENCOUNTER — Other Ambulatory Visit (INDEPENDENT_AMBULATORY_CARE_PROVIDER_SITE_OTHER): Payer: Medicare Other

## 2010-12-28 ENCOUNTER — Ambulatory Visit (INDEPENDENT_AMBULATORY_CARE_PROVIDER_SITE_OTHER): Payer: Medicare Other | Admitting: Internal Medicine

## 2010-12-28 ENCOUNTER — Encounter: Payer: Self-pay | Admitting: Internal Medicine

## 2010-12-28 VITALS — BP 120/80 | HR 66 | Temp 98.2°F | Wt 153.0 lb

## 2010-12-28 DIAGNOSIS — Z0000 Encounter for general adult medical examination without abnormal findings: Secondary | ICD-10-CM | POA: Insufficient documentation

## 2010-12-28 DIAGNOSIS — D0590 Unspecified type of carcinoma in situ of unspecified breast: Secondary | ICD-10-CM

## 2010-12-28 DIAGNOSIS — E559 Vitamin D deficiency, unspecified: Secondary | ICD-10-CM

## 2010-12-28 DIAGNOSIS — Z136 Encounter for screening for cardiovascular disorders: Secondary | ICD-10-CM

## 2010-12-28 DIAGNOSIS — Z9889 Other specified postprocedural states: Secondary | ICD-10-CM

## 2010-12-28 DIAGNOSIS — R7309 Other abnormal glucose: Secondary | ICD-10-CM

## 2010-12-28 DIAGNOSIS — Z862 Personal history of diseases of the blood and blood-forming organs and certain disorders involving the immune mechanism: Secondary | ICD-10-CM

## 2010-12-28 DIAGNOSIS — Z8639 Personal history of other endocrine, nutritional and metabolic disease: Secondary | ICD-10-CM

## 2010-12-28 LAB — COMPREHENSIVE METABOLIC PANEL
ALT: 12 U/L (ref 0–35)
AST: 17 U/L (ref 0–37)
Albumin: 4.3 g/dL (ref 3.5–5.2)
Alkaline Phosphatase: 54 U/L (ref 39–117)
BUN: 19 mg/dL (ref 6–23)
CO2: 25 mEq/L (ref 19–32)
Calcium: 9.3 mg/dL (ref 8.4–10.5)
Chloride: 104 mEq/L (ref 96–112)
Creatinine, Ser: 0.8 mg/dL (ref 0.4–1.2)
GFR: 74.4 mL/min (ref >60.00)
Glucose, Bld: 87 mg/dL (ref 70–99)
Potassium: 4 mEq/L (ref 3.5–5.1)
Sodium: 140 mEq/L (ref 135–145)
Total Bilirubin: 0.6 mg/dL (ref 0.3–1.2)
Total Protein: 7 g/dL (ref 6.0–8.3)

## 2010-12-28 LAB — HEMOGLOBIN A1C: Hgb A1c MFr Bld: 5.5 % (ref 4.6–6.5)

## 2010-12-28 LAB — TSH: TSH: 1.93 u[IU]/mL (ref 0.35–5.50)

## 2010-12-28 NOTE — Assessment & Plan Note (Signed)
Doing very well with no limitations in her activities. She walks daily. She has no c/o significant knee pain or decreased  Range of motion.

## 2010-12-28 NOTE — Assessment & Plan Note (Addendum)
Interval history is unremarkable. Physical exam, sans breast or pelvic exam, is normal. Lab results are within normal limits except for elevated LDL cholesterol at 143 with a goal of less than 130. Per NCEP ATP III guidelines this is below the treatment threshold of 160 or higher. Recommend contnued dietary vigilance with a low fat diet and continued exercise. Recommend follow-up lipid panel in 6 months. She is current with colorectal cancer screening with last study May '03 with follow-up study due May '13. Immunizations: tetanus August '11; pneumonia vaccine August '11; Singles vaccine October '10. 12 lead EKG is negative for ischemia or signs of injury. Patient was on high dose Vitamin D replacement but has been on routine replacement doses. Vit D level pending at this time.   In summary - a very nice woman who appears medically stable. She is counseled to follow a low fat diet and to continue to exercise in order to treat her mildly elevated LDL cholesterol. She is to return for lab in 6 months. She will otherwise return as needed or in one year.

## 2010-12-28 NOTE — Assessment & Plan Note (Signed)
NO episodes of hypoglycemia. A1C at 5.5% does not suggest any sustained episodes of low blood sugar. No further evaluation required at this time.

## 2010-12-28 NOTE — Progress Notes (Deleted)
  Subjective:    Patient ID: Jessica Woodward, female    DOB: 23-Jan-1941, 70 y.o.   MRN: [id number]  HPI    Review of Systems     Objective:   Physical Exam        Assessment & Plan:

## 2010-12-28 NOTE — Assessment & Plan Note (Addendum)
Very stable - now 10 years out. She continues to see oncology. She is current with mammography with no evidence of recurrence. Presently on Evista and she will continue the same.

## 2010-12-28 NOTE — Progress Notes (Signed)
Subjective:    Patient ID: Jessica Woodward, female    DOB: 07/01/1940, 70 y.o.   MRN: [id number]  HPI  The patient is here for annual Medicare wellness examination and management of other chronic and acute problems. No problems in the last year. Feeling well. She did see Dr. Felicity Coyer for tendonitis which has resolved.    The risk factors are reflected in the social history.  The roster of all physicians providing medical care to patient - is listed in the Snapshot section of the chart.  Activities of daily living:  The patient is 100% inedpendent in all ADLs: dressing, toileting, feeding as well as independent mobility  Home safety : The patient has smoke detectors in the home. They wear seatbelts. Firearms are present in the home, kept in a safe fashion. There is no violence in the home.   There is no risks for hepatitis, STDs or HIV. There is no history of blood transfusion. They have no travel history to infectious disease endemic areas of the world.  The patient has seen their dentist in the last six month. They have seen their eye doctor in the last year. They deny any hearing difficulty and have not had audiologic testing in the last year.  They do not  have excessive sun exposure. Discussed the need for sun protection: hats, long sleeves and use of sunscreen if there is significant sun exposure.   Diet: the importance of a healthy diet is discussed. They do have a healthy diet.  The patient has a regular exercise program: walk , 30-40 min duration, 3-5 times per week.  The benefits of regular aerobic exercise were discussed.  Depression screen: there are no signs or vegative symptoms of depression- irritability, change in appetite, anhedonia, sadness/tearfullness.  Cognitive assessment: the patient manages all their financial and personal affairs and is actively engaged. They could relate day,date,year and events; recalled 3/3 objects at 3 minutes; performed clock-face test  normally.  The following portions of the patient's history were reviewed and updated as appropriate: allergies, current medications, past family history, past medical history,  past surgical history, past social history  and problem list.  Vision, hearing, body mass index were assessed and reviewed.   During the course of the visit the patient was educated and counseled about appropriate screening and preventive services including : fall prevention , diabetes screening, nutrition counseling, colorectal cancer screening, and recommended immunizations.  Past Medical History  Diagnosis Date  . DUCTAL CARCINOMA IN SITU, LEFT BREAST 2002    s/p left lumpectomy, xrt and tamoxifen  . CARCINOMA, SQUAMOUS CELL, HX OF 06/05/2007  . RADIATION THERAPY, HX OF    Past Surgical History  Procedure Date  . Dilation and curettage of uterus   . Left knee surgery   . Abdominal hysterectomy   . Breast surgery     Left breast lumpectomy with axillary sential node  . Right breast lupectomy   . Tubal ligation   . Mohs surgery     (R) arm  . Tonsillectomy and adenoidectomy    Family History  Problem Relation Age of Onset  . Coronary artery disease Mother   . Macular degeneration Father   . Coronary artery disease Father    History   Social History  . Marital Status: Married    Spouse Name: N/A    Number of Children: N/A  . Years of Education: N/A   Occupational History  . Not on file.   Social History Main  Topics  . Smoking status: Never Smoker   . Smokeless tobacco: Not on file  . Alcohol Use: No  . Drug Use: No  . Sexually Active:    Other Topics Concern  . Not on file   Social History Narrative  . No narrative on file      Review of Systems Review of Systems  Constitutional:  Negative for fever, chills, activity change and unexpected weight change.  HEENT:  Negative for hearing loss, ear pain, congestion, neck stiffness and postnasal drip. Negative for sore throat or  swallowing problems. Negative for dental complaints.   Eyes: Negative for vision loss or change in visual acuity.  Respiratory: Negative for chest tightness and wheezing.   Cardiovascular: Negative for chest pain and palpitation. No decreased exercise tolerance Gastrointestinal: No change in bowel habit. No bloating or gas. No reflux or indigestion Genitourinary: Negative for urgency, frequency, flank pain and difficulty urinating.  Musculoskeletal: Negative for myalgias, back pain, arthralgias and gait problem. Heel pain with initial weight bearing. Neurological: Negative for dizziness, tremors, weakness and headaches.  Hematological: Negative for adenopathy.  Psychiatric/Behavioral: Negative for behavioral problems and dysphoric mood.       Objective:   Physical Exam Vitals reviewed. Gen'l: well nourished, well developed white woman in no distress HEENT - [place]/AT, EACs/TMs normal, oropharynx with native dentition in good condition, no buccal or palatal lesions, posterior pharynx clear, mucous membranes moist. C&S clear, PERRLA, fundi - normal Neck - supple, no thyromegaly Nodes- negative submental, cervical, supraclavicular regions Chest - no deformity, no CVAT Lungs - cleat without rales, wheezes. No increased work of breathing Breast - deferred to oncology and surgery Cardiovascular - regular rate and rhythm, quiet precordium, no murmurs, rubs or gallops, 2+ radial, DP and PT pulses Abdomen - BS+ x 4, no HSM, no guarding or rebound or tenderness Pelvic - deferred to gyn Rectal - deferred to gyn Extremities - no clubbing, cyanosis, edema or deformity.  Neuro - A&O x 3, CN II-XII normal, motor strength normal and equal, DTRs 2+ and symmetrical biceps, radial, and patellar tendons. Cerebellar - no tremor, no rigidity, fluid movement and normal gait. Derm - Head, neck, back, abdomen and extremities without suspicious lesions  Lab Results  Component Value Date   WBC 9.6 12/23/2009   HGB  12.9 05/07/2010   HCT 37.8 05/07/2010   PLT 261 05/07/2010   CHOL 243* 12/23/2009   TRIG 144.0 12/23/2009   HDL 77.00 12/23/2009   LDLDIRECT 143.5 12/23/2009   ALT 12 12/28/2010   AST 17 12/28/2010   NA 140 12/28/2010   K 4.0 12/28/2010   CL 104 12/28/2010   CREATININE 0.8 12/28/2010   BUN 19 12/28/2010   CO2 25 12/28/2010   TSH 1.93 12/28/2010   HGBA1C 5.5 12/28/2010           Assessment & Plan:

## 2010-12-31 LAB — VITAMIN D 1,25 DIHYDROXY
Vitamin D 1, 25 (OH)2 Total: 52 pg/mL (ref 18–72)
Vitamin D2 1, 25 (OH)2: 10 pg/mL
Vitamin D3 1, 25 (OH)2: 42 pg/mL

## 2011-01-15 ENCOUNTER — Other Ambulatory Visit: Payer: Self-pay | Admitting: Dermatology

## 2011-02-09 ENCOUNTER — Other Ambulatory Visit: Payer: Self-pay | Admitting: Oncology

## 2011-02-09 DIAGNOSIS — Z1231 Encounter for screening mammogram for malignant neoplasm of breast: Secondary | ICD-10-CM

## 2011-03-17 ENCOUNTER — Telehealth: Payer: Self-pay | Admitting: *Deleted

## 2011-03-19 NOTE — Telephone Encounter (Signed)
Refill encounter ?

## 2011-03-22 ENCOUNTER — Ambulatory Visit
Admission: RE | Admit: 2011-03-22 | Discharge: 2011-03-22 | Disposition: A | Discharge status: Home / Self Care | Payer: Medicare Other | Source: Ambulatory Visit | Attending: Oncology | Admitting: Oncology

## 2011-03-22 DIAGNOSIS — Z1231 Encounter for screening mammogram for malignant neoplasm of breast: Secondary | ICD-10-CM

## 2011-04-01 ENCOUNTER — Other Ambulatory Visit: Payer: Self-pay | Admitting: Physician Assistant

## 2011-04-01 ENCOUNTER — Telehealth: Payer: Self-pay | Admitting: *Deleted

## 2011-04-01 DIAGNOSIS — C50919 Malignant neoplasm of unspecified site of unspecified female breast: Secondary | ICD-10-CM

## 2011-04-01 NOTE — Telephone Encounter (Signed)
Per request from the nurse made patient appointment for 04-23-2011 to see dr.magrinat

## 2011-04-02 ENCOUNTER — Other Ambulatory Visit: Payer: Self-pay | Admitting: Oncology

## 2011-04-02 ENCOUNTER — Other Ambulatory Visit (HOSPITAL_BASED_OUTPATIENT_CLINIC_OR_DEPARTMENT_OTHER): Payer: Medicare Other | Admitting: Lab

## 2011-04-02 ENCOUNTER — Telehealth: Payer: Self-pay | Admitting: *Deleted

## 2011-04-02 DIAGNOSIS — C50519 Malignant neoplasm of lower-outer quadrant of unspecified female breast: Secondary | ICD-10-CM

## 2011-04-02 DIAGNOSIS — Z170 Estrogen receptor positive status [ER+]: Secondary | ICD-10-CM

## 2011-04-02 DIAGNOSIS — C50919 Malignant neoplasm of unspecified site of unspecified female breast: Secondary | ICD-10-CM

## 2011-04-02 LAB — CBC WITH DIFFERENTIAL/PLATELET
BASO%: 0.6 % (ref 0.0–2.0)
Basophils Absolute: 0 10*3/uL (ref 0.0–0.1)
EOS%: 2.5 % (ref 0.0–7.0)
Eosinophils Absolute: 0.1 10*3/uL (ref 0.0–0.5)
HCT: 38.5 % (ref 34.8–46.6)
HGB: 13.1 g/dL (ref 11.6–15.9)
LYMPH%: 31.3 % (ref 14.0–49.7)
MCH: 31.1 pg (ref 25.1–34.0)
MCHC: 34 g/dL (ref 31.5–36.0)
MCV: 91.4 fL (ref 79.5–101.0)
MONO#: 0.6 10*3/uL (ref 0.1–0.9)
MONO%: 10.2 % (ref 0.0–14.0)
NEUT#: 3.1 10*3/uL (ref 1.5–6.5)
NEUT%: 55.4 % (ref 38.4–76.8)
Platelets: 268 10*3/uL (ref 145–400)
RBC: 4.21 10*6/uL (ref 3.70–5.45)
RDW: 13.7 % (ref 11.2–14.5)
WBC: 5.6 10*3/uL (ref 3.9–10.3)
lymph#: 1.7 10*3/uL (ref 0.9–3.3)

## 2011-04-02 LAB — COMPREHENSIVE METABOLIC PANEL
ALT: 10 U/L (ref 0–35)
AST: 16 U/L (ref 0–37)
Albumin: 3.7 g/dL (ref 3.5–5.2)
Alkaline Phosphatase: 72 U/L (ref 39–117)
BUN: 18 mg/dL (ref 6–23)
CO2: 27 mEq/L (ref 19–32)
Calcium: 10 mg/dL (ref 8.4–10.5)
Chloride: 105 mEq/L (ref 96–112)
Creatinine, Ser: 0.79 mg/dL (ref 0.50–1.10)
Glucose, Bld: 85 mg/dL (ref 70–99)
Potassium: 3.9 mEq/L (ref 3.5–5.3)
Sodium: 141 mEq/L (ref 135–145)
Total Bilirubin: 0.2 mg/dL — ABNORMAL LOW (ref 0.3–1.2)
Total Protein: 6.9 g/dL (ref 6.0–8.3)

## 2011-04-02 LAB — CANCER ANTIGEN 27.29: CA 27.29: 46 U/mL — ABNORMAL HIGH (ref 0–39)

## 2011-04-02 NOTE — Telephone Encounter (Signed)
Notified pt that she has an appt with MD on Nov 30th at 1230

## 2011-04-07 ENCOUNTER — Ambulatory Visit (INDEPENDENT_AMBULATORY_CARE_PROVIDER_SITE_OTHER): Payer: Medicare Other | Admitting: Internal Medicine

## 2011-04-07 ENCOUNTER — Encounter: Payer: Self-pay | Admitting: Internal Medicine

## 2011-04-07 VITALS — BP 142/72 | HR 75 | Temp 98.9°F | Wt 153.9 lb

## 2011-04-07 DIAGNOSIS — M25569 Pain in unspecified knee: Secondary | ICD-10-CM

## 2011-04-07 DIAGNOSIS — M25561 Pain in right knee: Secondary | ICD-10-CM

## 2011-04-07 MED ORDER — DICLOFENAC SODIUM 75 MG PO TBEC: 75.0000 mg | DELAYED_RELEASE_TABLET | Freq: Two times a day (BID) | ORAL | Status: DC

## 2011-04-07 NOTE — Progress Notes (Signed)
  Subjective:    Patient ID: Jessica Woodward, female    DOB: 02-10-1941, 70 y.o.   MRN: [id number]  HPI  Complains of right knee pain Onset of symptoms 2 days ago Denies precipitating injury, specifically no twisting trauma or falls History of similar symptoms in left knee associated with meniscal tear Not associated with swelling or fever Pain symptoms worse upon flexion of knee  Past Medical History  Diagnosis Date  . DUCTAL CARCINOMA IN SITU, LEFT BREAST 2002    s/p left lumpectomy, xrt and tamoxifen  . CARCINOMA, SQUAMOUS CELL, HX OF 06/05/2007  . RADIATION THERAPY, HX OF     Review of Systems  Constitutional: Negative for chills and fatigue.  Musculoskeletal: Negative for myalgias, back pain and gait problem.       Objective:   Physical Exam BP 142/72  Pulse 75  Temp(Src) 98.9 F (37.2 C) (Oral)  Wt 153 lb 14.4 oz (69.809 kg)  SpO2 98%  Gen.: No acute distress, nontoxic Mskel - right knee with minimal effusion on medial side consistent with synovitis. No obvious deformity. Pop on flexion motion. Minimally tender over medial side of knee to palpation. No abnormal redness, no bruising. Ligamentous function intact, full range of motion intact with pain upon flexion. Neurovascular intact distally     Assessment & Plan:  Right knee pain, possible meniscal tear. History of same on left side requiring surgery. Ligamentous function intact, no history of osteoarthritis. Advised conservative care with neoprene sleeve, NSAIDs twice a day x one week and ice. Patient to avoid high impact activity or strain, call if symptoms worse or unimproved with conservative care

## 2011-04-07 NOTE — Patient Instructions (Signed)
It was good to see you today. Voltaren twice a day x1 week with food - Your prescription(s) have been submitted to your pharmacy. Please take as directed and contact our office if you believe you are having problem(s) with the medication(s). Ice to knee 3 times a day as discussed, avoid aerobic activity and wear a neoprene sleeve as discussed If symptoms worse or unimproved, call for further testing as discussed

## 2011-04-23 ENCOUNTER — Ambulatory Visit (HOSPITAL_BASED_OUTPATIENT_CLINIC_OR_DEPARTMENT_OTHER): Payer: Medicare Other | Admitting: Oncology

## 2011-04-23 VITALS — BP 130/83 | HR 79 | Temp 98.2°F | Ht 63.0 in | Wt 152.3 lb

## 2011-04-23 DIAGNOSIS — C50519 Malignant neoplasm of lower-outer quadrant of unspecified female breast: Secondary | ICD-10-CM

## 2011-04-23 DIAGNOSIS — C50919 Malignant neoplasm of unspecified site of unspecified female breast: Secondary | ICD-10-CM

## 2011-04-23 DIAGNOSIS — Z170 Estrogen receptor positive status [ER+]: Secondary | ICD-10-CM

## 2011-04-23 NOTE — Progress Notes (Signed)
ID: April Holding   Interval History: Jessica Woodward returns today for followup of her breast cancer. The interval history is generally unremarkable. Her son who used to be stationed abroad is now in [place] which is the only in the [place] but relatively close. She now has 3 great-grandchildren. Family in general is thriving and she continues to exercise regularly.  ROS:  She developed an unusual pain in the lateral aspect of the right calf only way up to the knee. She saw Jessica Woodward for this and was started on Voltaren. After week the pain got a lot better and while it's not completely resolved it's well on the day. She is certainly walking normally now. Aside from this a detailed review of systems was noncontributory.  Medications: I have reviewed the patient's current medications.  Current Outpatient Prescriptions  Medication Sig Dispense Refill  . aspirin 81 MG tablet Take 81 mg by mouth daily.        . Calcium Carbonate-Vitamin D (CALTRATE 600+D) 600-400 MG-UNIT per tablet Take 1 tablet by mouth 2 (two) times daily.        . diclofenac (VOLTAREN) 75 MG EC tablet Take 1 tablet (75 mg total) by mouth 2 (two) times daily with a meal. X 1 week, then as needed  20 tablet  0  . diltiazem (CARDIZEM CD) 180 MG 24 hr capsule TAKE 1 CAPSULE DAILY  90 capsule  1  . Glucosamine-Chondroit-Vit C-Mn (GLUCOSAMINE-CHONDROITIN) TABS Take by mouth daily.        Marland Kitchen loratadine (CLARITIN) 10 MG tablet Take 10 mg by mouth daily as needed.        . raloxifene (EVISTA) 60 MG tablet Take 60 mg by mouth daily.           Objective: Vital signs in last 24 hours: BP 130/83  Pulse 79  Temp(Src) 98.2 F (36.8 C) (Oral)  Ht 5\' 3"  (1.6 m)  Wt 152 lb 4.8 oz (69.083 kg)  BMI 26.98 kg/m2   Physical Exam:    Sclerae unicteric  Oropharynx clear  No peripheral adenopathy  Lungs clear -- no rales or rhonchi  Heart regular rate and rhythm  Abdomen benign  MSK no focal spinal tenderness, no peripheral  edema  Neuro nonfocal  Breast exam: Right breast no suspicious findings. Left breast no evidence of local recurrence.  Lab Results: The CA 27-29 rose from 31 to 43 in 2009, was 42 Dec 2011 and is 46 now. There is no trend.  CMP      Component Value Date/Time   NA 141 04/02/2011 0905   NA 141 04/02/2011 0905   K 3.9 04/02/2011 0905   K 3.9 04/02/2011 0905   CL 105 04/02/2011 0905   CL 105 04/02/2011 0905   CO2 27 04/02/2011 0905   CO2 27 04/02/2011 0905   GLUCOSE 85 04/02/2011 0905   GLUCOSE 85 04/02/2011 0905   BUN 18 04/02/2011 0905   BUN 18 04/02/2011 0905   CREATININE 0.79 04/02/2011 0905   CREATININE 0.79 04/02/2011 0905   CALCIUM 10.0 04/02/2011 0905   CALCIUM 10.0 04/02/2011 0905   PROT 6.9 04/02/2011 0905   PROT 6.9 04/02/2011 0905   ALBUMIN 3.7 04/02/2011 0905   ALBUMIN 3.7 04/02/2011 0905   AST 16 04/02/2011 0905   AST 16 04/02/2011 0905   ALT 10 04/02/2011 0905   ALT 10 04/02/2011 0905   ALKPHOS 72 04/02/2011 0905   ALKPHOS 72 04/02/2011 0905   BILITOT 0.2* 04/02/2011 4098  BILITOT 0.2* 04/02/2011 0905   GFRNONAA 70.59 12/23/2009 1342    CBC Lab Results  Component Value Date   WBC 5.6 04/02/2011   HGB 13.1 04/02/2011   HCT 38.5 04/02/2011   MCV 91.4 04/02/2011   PLT 268 04/02/2011    Studies/Results: Diagnostic mammography 03/23/2011 was entirely negative.   Assessment: 70 year old [name] woman status post left lumpectomy and sentinel lymph node biopsy May of 2002 for a T2 N0, Stage II, invasive ductal carcinoma, grade 1, estrogen and progesterone receptor positive, HER-2 not amplified, treated adjuvantly with cyclophosphamide and doxorubicin x4, then briefly on anastrozole with very poor tolerance, on tamoxifen for 5 years completed February of 2008 at which point she started raloxifene (Evista).   Plan:  At this point uncomfortable releasing her back to her primary care physician. She had questions regarding whether or not to continue either step. She is rarely taking it now  more for bone concerns then for breast cancer prevention. We do not have data that after 5 years of tamoxifen taking raloxifene is helpful or unhelpful as far as breast cancer recurrence is concerned. On the other hand we don't have a terminus at which we must discontinue the raloxifene is she is taking it because of concerns regarding bone density.  As it happens her bone densities have been fine. After much discussion was she decided to do is continue on the Evista another year and I went ahead and refill her prescription.  I have not made any further appointments for Jessica Woodward here but she knows that we'll be glad to see her at her or her physician's discretion as needed in the future.  Jessica Woodward C 04/23/2011

## 2011-05-09 ENCOUNTER — Other Ambulatory Visit: Payer: Self-pay | Admitting: Physician Assistant

## 2011-05-09 DIAGNOSIS — C50919 Malignant neoplasm of unspecified site of unspecified female breast: Secondary | ICD-10-CM

## 2011-05-24 ENCOUNTER — Telehealth: Payer: Self-pay | Admitting: *Deleted

## 2011-05-24 MED ORDER — DILTIAZEM HCL ER COATED BEADS 180 MG PO CP24: 180.0000 mg | ORAL_CAPSULE | Freq: Every day | ORAL | Status: DC

## 2011-05-24 NOTE — Telephone Encounter (Signed)
Diltiazem request. Done.

## 2011-05-25 HISTORY — PX: OTHER SURGICAL HISTORY: SHX169

## 2011-08-04 ENCOUNTER — Encounter: Payer: Self-pay | Admitting: Internal Medicine

## 2011-08-09 ENCOUNTER — Encounter: Payer: Self-pay | Admitting: Internal Medicine

## 2011-08-10 DIAGNOSIS — H2510 Age-related nuclear cataract, unspecified eye: Secondary | ICD-10-CM | POA: Diagnosis not present

## 2011-09-01 DIAGNOSIS — M25569 Pain in unspecified knee: Secondary | ICD-10-CM | POA: Diagnosis not present

## 2011-09-22 ENCOUNTER — Ambulatory Visit (AMBULATORY_SURGERY_CENTER): Payer: Medicare Other | Admitting: *Deleted

## 2011-09-22 VITALS — Ht 63.0 in | Wt 151.4 lb

## 2011-09-22 DIAGNOSIS — Z1211 Encounter for screening for malignant neoplasm of colon: Secondary | ICD-10-CM

## 2011-09-22 MED ORDER — MOVIPREP 100 G PO SOLR: ORAL | Status: DC

## 2011-09-28 DIAGNOSIS — M25569 Pain in unspecified knee: Secondary | ICD-10-CM | POA: Diagnosis not present

## 2011-10-06 ENCOUNTER — Ambulatory Visit (AMBULATORY_SURGERY_CENTER): Payer: Medicare Other | Admitting: Internal Medicine

## 2011-10-06 ENCOUNTER — Encounter: Payer: Self-pay | Admitting: Internal Medicine

## 2011-10-06 ENCOUNTER — Telehealth: Payer: Self-pay | Admitting: *Deleted

## 2011-10-06 ENCOUNTER — Ambulatory Visit (HOSPITAL_COMMUNITY)
Admission: RE | Admit: 2011-10-06 | Discharge: 2011-10-06 | Disposition: A | Discharge status: Home / Self Care | Payer: Medicare Other | Source: Ambulatory Visit | Attending: Internal Medicine | Admitting: Internal Medicine

## 2011-10-06 VITALS — BP 167/108 | HR 73 | Temp 97.7°F | Resp 16 | Ht 63.0 in | Wt 151.0 lb

## 2011-10-06 DIAGNOSIS — Q438 Other specified congenital malformations of intestine: Secondary | ICD-10-CM | POA: Diagnosis not present

## 2011-10-06 DIAGNOSIS — Z538 Procedure and treatment not carried out for other reasons: Secondary | ICD-10-CM | POA: Diagnosis not present

## 2011-10-06 DIAGNOSIS — Z1211 Encounter for screening for malignant neoplasm of colon: Secondary | ICD-10-CM

## 2011-10-06 DIAGNOSIS — R948 Abnormal results of function studies of other organs and systems: Secondary | ICD-10-CM | POA: Diagnosis not present

## 2011-10-06 MED ORDER — SODIUM CHLORIDE 0.9 % IV SOLN: 500.0000 mL | INTRAVENOUS | Status: DC

## 2011-10-06 NOTE — Op Note (Signed)
[facility] [address] [place], [place]  [address]  COLONOSCOPY PROCEDURE REPORT  PATIENT:  Jessica, Woodward  MR#:  [id number] BIRTHDATE:  1940-08-28, 71 yrs. old  GENDER:  female ENDOSCOPIST:  Hedwig Morton. Juanda Chance, MD REF. BY: PROCEDURE DATE:  10/06/2011 PROCEDURE:  Average-risk screening colonoscopy G0121 ASA CLASS:  Class I INDICATIONS:  Screening last colon 2003 was normal, hx of breast cancer MEDICATIONS:   MAC sedation, administered by CRNA, propofol (Diprivan) 460 mg  DESCRIPTION OF PROCEDURE:   After the risks and benefits and of the procedure were explained, informed consent was obtained. Digital rectal exam was performed and revealed no rectal masses. The LB PCF-H180AL [id number] and LB CF-H180AL [id number] endoscope was introduced through the anus and advanced to the hepatic flexure. The quality of the prep was Moviprep fair.  The instrument was then slowly withdrawn as the colon was fully examined. <<PROCEDUREIMAGES>>  FINDINGS:  incomplete exam (see image1 and image2). long redundant hypotonic colon, unable to advance to the cecum,despite of switching scopes and repositioning the patient   Retroflexed views in the rectum revealed no abnormalities.    The scope was then withdrawn from the patient and the procedure completed.  COMPLICATIONS:  None ENDOSCOPIC IMPRESSION: 1) Incomplete exam long redundant colon, normal to hepatic flexure RECOMMENDATIONS: Barium enema today if possible while she is still prepped, to vis the rest of the colon  REPEAT EXAM:  In 10 year(s) for.  may need virtual colon in 10 years if BE normal  ______________________________ Hedwig Morton. Juanda Chance, MD  CC:  n. eSIGNED:   Hedwig Morton. Joseth Weigel at 10/06/2011 10:31 AM  Marc Morgans, [phone]

## 2011-10-06 NOTE — Progress Notes (Signed)
Patient did not experience any of the following events: a burn prior to discharge; a fall within the facility; wrong site/side/patient/procedure/implant event; or a hospital transfer or hospital admission upon discharge from the facility. (G8907) Patient did not have preoperative order for IV antibiotic SSI prophylaxis. (G8918)  

## 2011-10-06 NOTE — Patient Instructions (Signed)
YOU ARE FOR BARIUM ENEMA TODAY AT 12:45 PM, [facility]. NOTHING BY MOUTH UNTIL AFTER THIS IS COMPLETED.    REPEAT COLONOSCOPY IN 10 YEARS.   YOU HAD AN ENDOSCOPIC PROCEDURE TODAY AT THE [facility]: Refer to the procedure report that was given to you for any specific questions about what was found during the examination.  If the procedure report does not answer your questions, please call your gastroenterologist to clarify.  If you requested that your care partner not be given the details of your procedure findings, then the procedure report has been included in a sealed envelope for you to review at your convenience later.  YOU SHOULD EXPECT: Some feelings of bloating in the abdomen. Passage of more gas than usual.  Walking can help get rid of the air that was put into your GI tract during the procedure and reduce the bloating. If you had a lower endoscopy (such as a colonoscopy or flexible sigmoidoscopy) you may notice spotting of blood in your stool or on the toilet paper. If you underwent a bowel prep for your procedure, then you may not have a normal bowel movement for a few days.  DIET: Your first meal following the procedure should be a light meal and then it is ok to progress to your normal diet.  A half-sandwich or bowl of soup is an example of a good first meal.  Heavy or fried foods are harder to digest and may make you feel nauseous or bloated.  Likewise meals heavy in dairy and vegetables can cause extra gas to form and this can also increase the bloating.  Drink plenty of fluids but you should avoid alcoholic beverages for 24 hours.  ACTIVITY: Your care partner should take you home directly after the procedure.  You should plan to take it easy, moving slowly for the rest of the day.  You can resume normal activity the day after the procedure however you should NOT DRIVE or use heavy machinery for 24 hours (because of the sedation medicines used during the test).      SYMPTOMS TO REPORT IMMEDIATELY: A gastroenterologist can be reached at any hour.  During normal business hours, 8:30 AM to 5:00 PM Monday through Friday, call [phone].  After hours and on weekends, please call the GI answering service at [phone] who will take a message and have the physician on call contact you.   Following lower endoscopy (colonoscopy or flexible sigmoidoscopy):  Excessive amounts of blood in the stool  Significant tenderness or worsening of abdominal pains  Swelling of the abdomen that is new, acute  Fever of 100F or higher  FOLLOW UP: If any biopsies were taken you will be contacted by phone or by letter within the next 1-3 weeks.  Call your gastroenterologist if you have not heard about the biopsies in 3 weeks.  Our staff will call the home number listed on your records the next business day following your procedure to check on you and address any questions or concerns that you may have at that time regarding the information given to you following your procedure. This is a courtesy call and so if there is no answer at the home number and we have not heard from you through the emergency physician on call, we will assume that you have returned to your regular daily activities without incident.  SIGNATURES/CONFIDENTIALITY: You and/or your care partner have signed paperwork which will be entered into your electronic medical  record.  These signatures attest to the fact that that the information above on your After Visit Summary has been reviewed and is understood.  Full responsibility of the confidentiality of this discharge information lies with you and/or your care-partner.

## 2011-10-06 NOTE — Progress Notes (Addendum)
Difficulty reaching cecum with 1st scope. Scope withdrawn and restarted colonoscopy with stiffer scope per  Dr. Juanda Chance. 17.58 time for attempt at 1st insertion.

## 2011-10-06 NOTE — Progress Notes (Addendum)
Pt states she does get nauseated with certain anesthesia's and wants to make [place] aware of this. ewm  Pt very hard iv stick.  1st and 2nd attempt by j edwards, rn to rt hand and rt arm and unsuccessful.  E Helen Cuff rn attempted rt hand and right ac and unsuccessful. j geyer rn attempted rt hand and unsuccessful. Pt tolerated all sticks well and appears very understanding with all attempts. ewm

## 2011-10-06 NOTE — Progress Notes (Signed)
Pressure applied to a abdomen to reach cecum

## 2011-10-06 NOTE — Telephone Encounter (Signed)
Per Dr. Juanda Chance, patient needs Barium enema without air. No biopsies taken. Redundant colon. Scheduled at [facility] radiology at 12:45/1:00PM. Recovery in endo notified and will tell patient/family.

## 2011-10-07 ENCOUNTER — Telehealth: Payer: Self-pay | Admitting: *Deleted

## 2011-10-07 NOTE — Telephone Encounter (Signed)
  Follow up Call-  Call back number 10/06/2011  Post procedure Call Back phone  # [phone]  Permission to leave phone message Yes     Patient questions:  Do you have a fever, pain , or abdominal swelling? no Pain Score  0 *  Have you tolerated food without any problems? no  Have you been able to return to your normal activities? no  Do you have any questions about your discharge instructions: Diet   no Medications  no Follow up visit  no  Do you have questions or concerns about your Care? no  Actions: * If pain score is 4 or above: No action needed, pain <4.

## 2011-10-11 DIAGNOSIS — Z124 Encounter for screening for malignant neoplasm of cervix: Secondary | ICD-10-CM | POA: Diagnosis not present

## 2011-10-11 DIAGNOSIS — Z01419 Encounter for gynecological examination (general) (routine) without abnormal findings: Secondary | ICD-10-CM | POA: Diagnosis not present

## 2011-10-16 DIAGNOSIS — M25569 Pain in unspecified knee: Secondary | ICD-10-CM | POA: Diagnosis not present

## 2011-10-28 ENCOUNTER — Other Ambulatory Visit: Payer: Self-pay | Admitting: Internal Medicine

## 2011-11-01 DIAGNOSIS — M25569 Pain in unspecified knee: Secondary | ICD-10-CM | POA: Diagnosis not present

## 2011-11-11 DIAGNOSIS — M25569 Pain in unspecified knee: Secondary | ICD-10-CM | POA: Diagnosis not present

## 2011-11-30 DIAGNOSIS — M23305 Other meniscus derangements, unspecified medial meniscus, unspecified knee: Secondary | ICD-10-CM | POA: Diagnosis not present

## 2011-11-30 DIAGNOSIS — X58XXXA Exposure to other specified factors, initial encounter: Secondary | ICD-10-CM | POA: Diagnosis not present

## 2011-11-30 DIAGNOSIS — IMO0002 Reserved for concepts with insufficient information to code with codable children: Secondary | ICD-10-CM | POA: Diagnosis not present

## 2011-11-30 DIAGNOSIS — M2340 Loose body in knee, unspecified knee: Secondary | ICD-10-CM | POA: Diagnosis not present

## 2011-11-30 DIAGNOSIS — M6740 Ganglion, unspecified site: Secondary | ICD-10-CM | POA: Diagnosis not present

## 2011-11-30 DIAGNOSIS — M2240 Chondromalacia patellae, unspecified knee: Secondary | ICD-10-CM | POA: Diagnosis not present

## 2011-11-30 DIAGNOSIS — Y929 Unspecified place or not applicable: Secondary | ICD-10-CM | POA: Diagnosis not present

## 2011-11-30 DIAGNOSIS — M794 Hypertrophy of (infrapatellar) fat pad: Secondary | ICD-10-CM | POA: Diagnosis not present

## 2011-12-08 DIAGNOSIS — M23305 Other meniscus derangements, unspecified medial meniscus, unspecified knee: Secondary | ICD-10-CM | POA: Diagnosis not present

## 2011-12-10 ENCOUNTER — Ambulatory Visit (HOSPITAL_COMMUNITY)
Admission: RE | Admit: 2011-12-10 | Discharge: 2011-12-10 | Disposition: A | Discharge status: Home / Self Care | Payer: Medicare Other | Source: Ambulatory Visit | Attending: Specialist | Admitting: Specialist

## 2011-12-10 DIAGNOSIS — IMO0001 Reserved for inherently not codable concepts without codable children: Secondary | ICD-10-CM | POA: Diagnosis not present

## 2011-12-10 DIAGNOSIS — M25569 Pain in unspecified knee: Secondary | ICD-10-CM | POA: Diagnosis not present

## 2011-12-10 DIAGNOSIS — R262 Difficulty in walking, not elsewhere classified: Secondary | ICD-10-CM | POA: Diagnosis not present

## 2011-12-10 DIAGNOSIS — Z87828 Personal history of other (healed) physical injury and trauma: Secondary | ICD-10-CM | POA: Insufficient documentation

## 2011-12-10 NOTE — Evaluation (Signed)
Physical Therapy Evaluation  Patient Details  Name: Jessica Woodward MRN: [id number] Date of Birth: Jul 03, 1940  Today's Date: 12/10/2011 Time: 1018-1105 PT Time Calculation (min): 47 min  Visit#: 1  of 4   Re-eval: 12/24/11 Assessment Diagnosis: R knee scope Surgical Date: 11/30/11 Next MD Visit: Dr. Thomasena Edis Prior Therapy: 1 evaluation by [facility]  Authorization: MEDICARE  Authorization Time Period:    Authorization Visit#: 1  of 4    Past Medical History:  Past Medical History  Diagnosis Date  . DUCTAL CARCINOMA IN SITU, LEFT BREAST 2002    s/p left lumpectomy, xrt and tamoxifen  . CARCINOMA, SQUAMOUS CELL, HX OF 06/05/2007  . RADIATION THERAPY, HX OF   . Seasonal allergies    Past Surgical History:  Past Surgical History  Procedure Date  . Dilation and curettage of uterus   . Left knee surgery   . Abdominal hysterectomy   . Breast surgery     Left breast lumpectomy with axillary sential node  . Right breast lupectomy   . Tubal ligation   . Mohs surgery     (R) arm  . Tonsillectomy and adenoidectomy     Subjective Symptoms/Limitations Symptoms: PMH: R knee scope, breast cancer, HTN Pertinent History: Pt is referred to PT S/P R knee scope. She has recieved an I.E. from [facility] on Wednesday and recieved an inital evaluation from [place].  She reports that she does not want another evaluation as she is worried about her insurance company paying.  Talked to [facility] and they report that they tell their patients that they will recieve another eval if they go elsewhere.  Pt reports today that she has been compliant with her HEP given by intial PT.  her c/co's are inability to walk 2 miles and bike for 20 minutes on her stationary bike.  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   Mobility/Balance  Ambulation/Gait Ambulation/Gait: Yes Gait Pattern: Antalgic   Exercise/Treatments Aerobic Stationary Bike: 6' 2.0 for activity tolerance Tread Mill: 5', 1.8 MPH to reach goals Standing Heel Raises: 10 reps;Limitations Terminal Knee Extension: Right;10 reps;Limitations;Theraband Theraband Level (Terminal Knee Extension): Level 4 (Blue) Terminal Knee Extension Limitations: 5 sec hold Lateral Step Up: Right;10 reps;Step Height: 6" Forward Step Up: Right;10 reps;Step Height: 6" SLS: 3x30 seconds Other Standing Knee Exercises: Heel walking 1 RT Supine Bridges: 10 reps Straight Leg Raises: 10 reps Sidelying Hip ABduction: 10 reps Hip ADduction: 10 reps Prone  Hamstring Curl: 10 reps Hip Extension: 10 reps    Physical Therapy Assessment and Plan PT Assessment and Plan Clinical Impression Statement: Ms. Kaman has referred to PT s/p R knee scope on 11/30/11.  She had an IE completed by [facility] and was then d/c from their facility on the same day.  S Pt will benefit from skilled therapeutic intervention in order to improve on the following deficits: Decreased strength;Decreased range of motion;Decreased activity tolerance Rehab Potential: Excellent PT Frequency: Min 2X/week PT Duration: Other (comment) (2 weeks) PT Treatment/Interventions: Gait training;Stair training;Functional mobility training;Therapeutic activities;Therapeutic exercise;Balance  training;Neuromuscular re-education;Patient/family education;Manual techniques;Modalities PT Plan: Continue with general strengthening.  Continue to educate patient on proper return  to lesiure activities.     Goals Home Exercise Program Pt will Perform Home Exercise Program: Independently PT Goal: Perform Home Exercise Program - Progress: Goal set today PT Short Term Goals Time to Complete Short Term Goals: 2 weeks PT Short Term Goal 1: Pt will improve LE strength to [facility]. PT Short Term Goal 2: Pt will improve her R knee AROM: 0-125 PT Short Term Goal 3: Pt will report ability to ambulate for 30 minutes in outdoor environment in order to return to lesuire activities.  PT Short Term Goal 4: Pt will score a   Problem List Patient Active Problem List  Diagnosis  . DUCTAL CARCINOMA IN SITU, LEFT BREAST  . Other specified disorders of bladder  . ENDOMETRIOSIS  . ENDOMETRIAL POLYP  . CARCINOMA, SQUAMOUS CELL, HX OF  . HYPOGLYCEMIA, HX OF  . RADIATION THERAPY, HX OF  . TONSILLECTOMY AND ADENOIDECTOMY, HX OF  . Other Postprocedural Status    PT - End of Session Activity Tolerance: Patient tolerated treatment well PT Plan of Care PT Home Exercise Plan: see scanned report  PT Patient Instructions: Discussed POC.  attempted to call medicare about billing concerns.  Consulted and Agree with Plan of Care: Patient  GP Functional Assessment Tool Used: LEFS (44/68: 34% disability), self report, ROM and MMT Functional Limitation: Mobility: Walking and moving around Mobility: Walking and Moving Around Current Status (Z6109): At least 20 percent but less than 40 percent impaired, limited or restricted Mobility: Walking and Moving Around Goal Status [phone]): At least 1 percent but less than 20 percent impaired, limited or restricted  Johnie Makki, PT 12/10/2011, 2:14 PM  Physician Documentation Your signature is required to indicate approval of the treatment plan as stated above.  Please sign and either send electronically or make a copy of this report for your files and return this physician signed original.   Please mark one 1.__approve of plan  2. ___approve of plan  with the following conditions.   ______________________________                                                          _____________________ Physician Signature                                                                                                             Date

## 2011-12-10 NOTE — Evaluation (Signed)
Physical Therapy Evaluation  Patient Details  Name: Jessica Woodward MRN: [id number] Date of Birth: [birth year]  Today's Date: 12/10/2011 Time: 1018-1105 PT Time Calculation (min): 47 min  Visit#: 1  of 4   Re-eval: 12/24/11 Assessment Diagnosis: R knee scope Surgical Date: 11/30/11 Next MD Visit: Dr. Thomasena Edis Prior Therapy: 1 evaluation by [facility] orth[facility]ics  Authorization: MEDICARE  Authorization Time Period:    Authorization Visit#: 1  of 4    Past Medical History:  Past Medical History  Diagnosis Date  . DUCTAL CARCINOMA IN SITU, LEFT BREAST 2002    s/p left lumpectomy, xrt and tamoxifen  . CARCINOMA, SQUAMOUS CELL, HX OF 06/05/2007  . RADIATION THERAPY, HX OF   . Seasonal allergies    Past Surgical History:  Past Surgical History  Procedure Date  . Dilation and curettage of uterus   . Left knee surgery   . Abdominal hysterectomy   . Breast surgery     Left breast lumpectomy with axillary sential node  . Right breast lupectomy   . Tubal ligation   . Mohs surgery     (R) arm  . Tonsillectomy and adenoidectomy     Subjective Symptoms/Limitations Symptoms: PMH: R knee scope, breast cancer, HTN Pertinent History: Pt is referred to PT S/P R knee scope. She has recieved an I.E. from [facility] on Wednesday and recieved an inital evaluation from [place].  She reports that she does not want another evaluation as she is worried about her insurance company paying.  Talked to [facility] and they report that they tell their patients that they will recieve another eval if they go elsewhere.  Pt reports today that she has been compliant with her HEP given by intial PT.  her c/co's are inability to walk 2 miles and bike for 20 minutes on her stationary bike.  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  Assessment RLE AROM (degrees) RLE Overall AROM Comments: R knee flexion: 3-123 RLE Strength RLE Overall Strength Comments: ALL WNL except:  Right Hip ABduction: 4/5 Right Knee Flexion: 4/5 Right Knee Extension: 4/5 Palpation Palpation: unable to palpate significant pain and tenderness to knee joint.   Exercise/Treatments Mobility/Balance  Ambulation/Gait Ambulation/Gait: Yes Gait Pattern: Antalgic   Stretches   Aerobic Stationary Bike: 6' 2.0 for activity tolerance Tread Mill: 5', 1.8 MPH to reach goals Machines for Strengthening   Plyometrics   Standing Heel Raises: 10 reps;Limitations Terminal Knee Extension: Right;10 reps;Limitations;Theraband Theraband Level (Terminal Knee Extension): Level 4 (Blue) Terminal Knee Extension Limitations: 5 sec hold Lateral Step Up: Right;10 reps;Step Height: 6" Forward Step Up: Right;10 reps;Step Height: 6" SLS: 3x30 seconds Other Standing Knee Exercises: Heel walking 1 RT Seated   Supine Bridges: 10 reps Straight Leg Raises: 10 reps Sidelying Hip ABduction: 10 reps Hip ADduction: 10 reps Prone  Hamstring Curl: 10 reps Hip Extension: 10 reps      Physical Therapy Assessment and Plan PT Assessment and Plan Clinical Impression Statement: Ms. Gasser has referred to PT s/p R knee scope on 11/30/11.  She had an IE completed by [facility] and  was then d/c from their facility on the same day.  S Pt will benefit from skilled therapeutic intervention in order to improve on the following deficits: Decreased strength;Decreased range of motion;Decreased activity tolerance Rehab Potential: Excellent PT Frequency: Min 2X/week PT Duration: Other  (comment) (2 weeks) PT Treatment/Interventions: Gait training;Stair training;Functional mobility training;Therapeutic activities;Therapeutic exercise;Balance training;Neuromuscular re-education;Patient/family education;Manual techniques;Modalities PT Plan: Continue with general strengthening.  Continue to educate patient on proper return to lesiure activities.     Goals Home Exercise Program Pt will Perform Home Exercise Program: Independently PT Goal: Perform Home Exercise Program - Progress: Goal set today PT Short Term Goals Time to Complete Short Term Goals: 2 weeks PT Short Term Goal 1: Pt will improve LE strength to [facility]. PT Short Term Goal 2: Pt will improve her R knee AROM: 0-125 PT Short Term Goal 3: Pt will report ability to ambulate for 30 minutes in outdoor environment in order to return to lesuire activities.  PT Short Term Goal 4: Pt will score a   Problem List Patient Active Problem List  Diagnosis  . DUCTAL CARCINOMA IN SITU, LEFT BREAST  . Other specified disorders of bladder  . ENDOMETRIOSIS  . ENDOMETRIAL POLYP  . CARCINOMA, SQUAMOUS CELL, HX OF  . HYPOGLYCEMIA, HX OF  . RADIATION THERAPY, HX OF  . TONSILLECTOMY AND ADENOIDECTOMY, HX OF  . Other Postprocedural Status  . Pain in joint, lower leg    PT - End of Session Activity Tolerance: Patient tolerated treatment well PT Plan of Care PT Home Exercise Plan: see scanned report  PT Patient Instructions: Discussed POC.  attempted to call medicare about billing concerns.  Consulted and Agree with Plan of Care: Patient  GP Functional Assessment Tool Used: LEFS (44/68: 34% disability), self report, ROM and MMT Functional Limitation: Mobility: Walking and moving around Mobility: Walking and Moving Around Current Status (F6213): At least 20 percent but less than 40 percent impaired, limited or restricted Mobility: Walking and Moving Around Goal Status [phone]): At least 1 percent but less than 20 percent impaired,  limited or restricted  Shakyra Mattera 12/10/2011, 3:22 PM  Physician Documentation Your signature is required to indicate approval of the treatment plan as stated above.  Please sign and either send electronically or make a copy of this report for your files and return this physician signed original.   Please mark one 1.__approve of plan  2. ___approve of plan with the following conditions.   ______________________________                                                          _____________________ Physician Signature                                                                                                             Date

## 2011-12-16 ENCOUNTER — Ambulatory Visit (HOSPITAL_COMMUNITY)
Admission: RE | Admit: 2011-12-16 | Discharge: 2011-12-16 | Disposition: A | Discharge status: Home / Self Care | Payer: Medicare Other | Source: Ambulatory Visit | Attending: Internal Medicine | Admitting: Internal Medicine

## 2011-12-16 NOTE — Progress Notes (Signed)
Physical Therapy Treatment Patient Details  Name: Jessica Woodward MRN: [id number] Date of Birth: 02-07-1941  Today's Date: 12/16/2011 Time: 4098-1191 PT Time Calculation (min): 45 min 43" therex  Visit#: 2  of 4   Re-eval: 12/24/11    Authorization:    Authorization Time Period:    Authorization Visit#:   of     Subjective: Symptoms/Limitations Symptoms: Patient reports feeling great this morning, states that she is now able to use the stairs without pain. She is still experiencing medial knee soreness, but not pain. She hope to discontinue PT as soon as possible.  Precautions/Restrictions     Exercise/Treatments Aerobic Stationary Bike: 6' 2.0 for activity tolerance Tread Mill: 5', 2.0 MPH to reach goals Machines for Strengthening   Standing Heel Raises: 10 reps;Limitations (Toe raises) Terminal Knee Extension: 15 reps Theraband Level (Terminal Knee Extension): Level 4 (Blue) Lateral Step Up: 15 reps;Step Height: 6";Hand Hold: 0;Right Forward Step Up: Right;Step Height: 6";Hand Hold: 0 Step Down: Right;Step Height: 4";10 reps;Hand Hold: 1 SLS: 3x30 w/foam (20" longest no HH);Right Other Standing Knee Exercises: Heel/toe-retro-tandem 1 RT each Seated   Supine Bridges: 10 reps Straight Leg Raises: 10 reps Sidelying Hip ABduction: 10 reps Hip ADduction: 10 reps Prone  Hamstring Curl: 10 reps Hip Extension: 10 reps    Physical Therapy Assessment and Plan PT Assessment and Plan Clinical Impression Statement: Ms. Anchondo is doing very well with all current and added exercises today. ROM is now 2-140 AROM. Patient eager to finish with with PT. Continue with POC. Add weight to mat exercies next treatment    Goals    Problem List Patient Active Problem List  Diagnosis  . DUCTAL CARCINOMA IN SITU, LEFT BREAST  . Other specified disorders of bladder  . ENDOMETRIOSIS  . ENDOMETRIAL POLYP  . CARCINOMA, SQUAMOUS CELL, HX OF  . HYPOGLYCEMIA, HX OF  . RADIATION  THERAPY, HX OF  . TONSILLECTOMY AND ADENOIDECTOMY, HX OF  . Other Postprocedural Status  . Pain in joint, lower leg    PT - End of Session Activity Tolerance: Patient tolerated treatment well General Behavior During Session: [facility] for tasks performed Cognition: [facility] for tasks performed  GP    Leetta Hendriks ATKINSO 12/16/2011, 9:48 AM

## 2011-12-17 ENCOUNTER — Ambulatory Visit (HOSPITAL_COMMUNITY)
Admission: RE | Admit: 2011-12-17 | Discharge: 2011-12-17 | Disposition: A | Discharge status: Home / Self Care | Payer: Medicare Other | Source: Ambulatory Visit | Attending: Internal Medicine | Admitting: Internal Medicine

## 2011-12-17 NOTE — Progress Notes (Addendum)
Physical Therapy Discharge/ G-Codes  Patient Details  Name: Jessica Woodward MRN: [id number] Date of Birth: 1940-09-19  Today's Date: 12/17/2011 Time: 4098-1191 PT Time Calculation (min): 49 min  Visit#: 3  of 4   Re-eval: 12/24/11 Assessment Diagnosis: R knee scope Surgical Date: 11/30/11 Next MD Visit: Dr. Thomasena Edis   Subjective Symptoms/Limitations Symptoms: Pt. states she is not having any pain today.  States she feels great. Pain Assessment Currently in Pain?: No/denies  Sensation/Coordination/Flexibility/Functional Tests Functional Tests Functional Tests: LEFS: 69/80 (was 44/80; pt is a 71 year old female and is not expected to run and hop, did not count these in the test).  Assessment RLE AROM (degrees) AROM: 2-142 (was 3-123)  RLE PROM (degrees) Right Knee Extension: 0  Right Knee Flexion: 145   RLE Strength Right Hip ABduction: 5/5 (was 4/5) Right Knee Flexion: 5/5 (was 4/5) Right Knee Extension: 5/5 (was 4/5) All others were 5/5 at initial evaluation  Exercise/Treatments Stretches Active Hamstring Stretch: Limitations Active Hamstring Stretch Limitations: HEP Gastroc Stretch: 3 reps;30 seconds Aerobic Stationary Bike: 6' 3.0 for activity tolerance Tread Mill: 7', 2.2-2.6 MPH to reach goals Standing Terminal Knee Extension: 15 reps Theraband Level (Terminal Knee Extension): Level 4 (Blue) Terminal Knee Extension Limitations: 5 sec hold Lateral Step Up: 15 reps;Step Height: 6";Hand Hold: 0;Right Forward Step Up: 15 reps;Step Height: 6";Hand Hold: 0;Right Step Down: 10 reps;Step Height: 6";Hand Hold: Museum/gallery conservator: 2 minutes SLS: 3x30 w/foam (20" longest no HH);Right Other Standing Knee Exercises: Heel/toe-retro-tandem 2 RT each Supine Bridges: Limitations Bridges Limitations: HEP Straight Leg Raises: Limitations Straight Leg Raises Limitations: HEP   Physical Therapy Assessment and Plan PT Assessment and Plan Clinical Impression Statement:  Pt. has met all goals and feels she is ready for discharge.  Pt. is compliant with HEP and has returned to all ADL's/leisure activities. PT Plan: Discharge to HEP per pt. request.    Goals Home Exercise Program Pt will Perform Home Exercise Program: Independently PT Goal: Perform Home Exercise Program - Progress: Met PT Short Term Goals Time to Complete Short Term Goals: 2 weeks PT Short Term Goal 1: Pt will improve LE strength to [facility]. PT Short Term Goal 1 - Progress: Met PT Short Term Goal 2: Pt will improve her R knee AROM: 0-125 PT Short Term Goal 2 - Progress: Met PT Short Term Goal 3: Pt will report ability to ambulate for 30 minutes in outdoor environment in order to return to lesuire activities.  PT Short Term Goal 4: Pt will score a   Problem List Patient Active Problem List  Diagnosis  . DUCTAL CARCINOMA IN SITU, LEFT BREAST  . Other specified disorders of bladder  . ENDOMETRIOSIS  . ENDOMETRIAL POLYP  . CARCINOMA, SQUAMOUS CELL, HX OF  . HYPOGLYCEMIA, HX OF  . RADIATION THERAPY, HX OF  . TONSILLECTOMY AND ADENOIDECTOMY, HX OF  . Other Postprocedural Status  . Pain in joint, lower leg    PT - End of Session Activity Tolerance: Patient tolerated treatment well General Behavior During Session: [facility] for tasks performed Cognition: [facility] for tasks performed  GP Functional Limitation: Mobility: Walking and moving around Mobility: Walking and Moving Around Goal Status [phone]): At least 1 percent but less than 20 percent impaired, limited or restricted Mobility: Walking and Moving Around Discharge Status [phone]): 0 percent impaired, limited or restricted  Lurena Nida, PTA/CLT 12/17/2011, 9:00 AM  Physician Documentation Your signature is required to indicate approval of the treatment plan as stated above.  Please sign and either send electronically or make a copy of this report for your files and return this physician signed original.   Please mark one 1.__approve of  plan  2. ___approve of plan with the following conditions.   ______________________________                                                          _____________________ Physician Signature                                                                                                             Date

## 2011-12-21 ENCOUNTER — Ambulatory Visit (HOSPITAL_COMMUNITY): Payer: Medicare Other | Admitting: Physical Therapy

## 2011-12-23 ENCOUNTER — Ambulatory Visit (HOSPITAL_COMMUNITY): Payer: Medicare Other | Admitting: Physical Therapy

## 2012-01-17 ENCOUNTER — Other Ambulatory Visit (INDEPENDENT_AMBULATORY_CARE_PROVIDER_SITE_OTHER): Payer: Medicare Other

## 2012-01-17 ENCOUNTER — Encounter: Payer: Self-pay | Admitting: Internal Medicine

## 2012-01-17 ENCOUNTER — Ambulatory Visit (INDEPENDENT_AMBULATORY_CARE_PROVIDER_SITE_OTHER): Payer: Medicare Other | Admitting: Internal Medicine

## 2012-01-17 VITALS — BP 122/88 | HR 75 | Temp 97.9°F | Resp 16 | Wt 149.0 lb

## 2012-01-17 DIAGNOSIS — Z8739 Personal history of other diseases of the musculoskeletal system and connective tissue: Secondary | ICD-10-CM

## 2012-01-17 DIAGNOSIS — E785 Hyperlipidemia, unspecified: Secondary | ICD-10-CM | POA: Diagnosis not present

## 2012-01-17 DIAGNOSIS — D0590 Unspecified type of carcinoma in situ of unspecified breast: Secondary | ICD-10-CM

## 2012-01-17 DIAGNOSIS — Z8639 Personal history of other endocrine, nutritional and metabolic disease: Secondary | ICD-10-CM

## 2012-01-17 DIAGNOSIS — Z862 Personal history of diseases of the blood and blood-forming organs and certain disorders involving the immune mechanism: Secondary | ICD-10-CM

## 2012-01-17 DIAGNOSIS — Z0000 Encounter for general adult medical examination without abnormal findings: Secondary | ICD-10-CM

## 2012-01-17 DIAGNOSIS — Z87828 Personal history of other (healed) physical injury and trauma: Secondary | ICD-10-CM

## 2012-01-17 LAB — COMPREHENSIVE METABOLIC PANEL
ALT: 15 U/L (ref 0–35)
AST: 17 U/L (ref 0–37)
Albumin: 4.2 g/dL (ref 3.5–5.2)
Alkaline Phosphatase: 69 U/L (ref 39–117)
BUN: 21 mg/dL (ref 6–23)
CO2: 25 mEq/L (ref 19–32)
Calcium: 9.8 mg/dL (ref 8.4–10.5)
Chloride: 105 mEq/L (ref 96–112)
Creatinine, Ser: 0.9 mg/dL (ref 0.4–1.2)
GFR: 68.3 mL/min (ref >60.00)
Glucose, Bld: 86 mg/dL (ref 70–99)
Potassium: 4.4 mEq/L (ref 3.5–5.1)
Sodium: 139 mEq/L (ref 135–145)
Total Bilirubin: 0.4 mg/dL (ref 0.3–1.2)
Total Protein: 7.1 g/dL (ref 6.0–8.3)

## 2012-01-17 LAB — LDL CHOLESTEROL, DIRECT: Direct LDL: 150 mg/dL

## 2012-01-17 LAB — LIPID PANEL
Cholesterol: 253 mg/dL — ABNORMAL HIGH (ref 0–200)
HDL: 86.3 mg/dL (ref >39.00)
Total CHOL/HDL Ratio: 3
Triglycerides: 104 mg/dL (ref 0.0–149.0)
VLDL: 20.8 mg/dL (ref 0.0–40.0)

## 2012-01-17 LAB — HEMOGLOBIN A1C: Hgb A1c MFr Bld: 5.4 % (ref 4.6–6.5)

## 2012-01-17 MED ORDER — DILTIAZEM HCL ER COATED BEADS 180 MG PO CP24: 180.0000 mg | ORAL_CAPSULE | Freq: Every day | ORAL | Status: DC

## 2012-01-17 NOTE — Progress Notes (Signed)
Subjective:    Patient ID: Jessica Woodward, female    DOB: 1940/10/18, 71 y.o.   MRN: [id number]  HPI The patient is here for annual Medicare wellness examination and management of other chronic and acute problems.  Interval: arthroscopy right knee for repair of torn meniscus followed by outpatient rehab. Recently saw Dr. Thomasena Edis and was released.   She has been released by Dr Darnelle Catalan and she has also stopped taking Evista.    The risk factors are reflected in the social history.  The roster of all physicians providing medical care to patient - is listed in the Snapshot section of the chart.  Activities of daily living:  The patient is 100% inedpendent in all ADLs: dressing, toileting, feeding as well as independent mobility  Home safety : The patient has smoke detectors in the home. Fal safety - recommended home inventory.  They wear seatbelts.  firearms are present in the home, kept in a safe fashion). There is no violence in the home.   There is no risks for hepatitis, STDs or HIV. There is no   history of blood transfusion. They have no travel history to infectious disease endemic areas of the world.  The patient has seen their dentist in the last six month. They have seen their eye doctor in the last year. They deny any hearing difficulty and have not had audiologic testing in the last year.   They do not  have excessive sun exposure. Discussed the need for sun protection: hats, long sleeves and use of sunscreen if there is significant sun exposure.   Diet: the importance of a healthy diet is discussed. They do have a healthy diet.  The patient has no regular exercise program: walking , 30 min duration, 3-5 per week.  The benefits of regular aerobic exercise were discussed.  Depression screen: there are no signs or vegative symptoms of depression- irritability, change in appetite, anhedonia, sadness/tearfullness.  Cognitive assessment: the patient manages all their financial and  personal affairs and is actively engaged.   The following portions of the patient's history were reviewed and updated as appropriate: allergies, current medications, past family history, past medical history,  past surgical history, past social history  and problem list.  Vision, hearing, body mass index were assessed and reviewed.   During the course of the visit the patient was educated and counseled about appropriate screening and preventive services including : fall prevention , diabetes screening, nutrition counseling, colorectal cancer screening, and recommended immunizations.  Past Medical History  Diagnosis Date  . DUCTAL CARCINOMA IN SITU, LEFT BREAST 2002    s/p left lumpectomy, xrt and tamoxifen  . CARCINOMA, SQUAMOUS CELL, HX OF 06/05/2007  . RADIATION THERAPY, HX OF   . Seasonal allergies    Past Surgical History  Procedure Date  . Dilation and curettage of uterus   . Left knee surgery   . Abdominal hysterectomy   . Breast surgery     Left breast lumpectomy with axillary sential node  . Right breast lupectomy   . Tubal ligation   . Mohs surgery     (R) arm  . Tonsillectomy and adenoidectomy   . Athroscopy right knee 2013    torn miniscus   Family History  Problem Relation Age of Onset  . Coronary artery disease Mother   . Macular degeneration Father   . Coronary artery disease Father    History   Social History  . Marital Status: Married    Spouse  Name: N/A    Number of Children: N/A  . Years of Education: N/A   Occupational History  . Not on file.   Social History Main Topics  . Smoking status: Former Smoker    Quit date: 12/28/1963  . Smokeless tobacco: Never Used  . Alcohol Use: Yes     rare glass of wine  . Drug Use: No  . Sexually Active: Yes -- Female partner(s)   Other Topics Concern  . Not on file   Social History Narrative   HS[employer], [employer] college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 married '60. 2 boys - '62, '73; 11 grandchildren; 3  great-grandchildren. retired, marriage in good health.   Current Outpatient Prescriptions on File Prior to Visit  Medication Sig Dispense Refill  . aspirin 81 MG tablet Take 81 mg by mouth daily.        . Calcium Carbonate-Vitamin D (CALTRATE 600+D) 600-400 MG-UNIT per tablet Take 1 tablet by mouth 2 (two) times daily.        . Glucosamine-Chondroit-Vit C-Mn (GLUCOSAMINE-CHONDROITIN) TABS Take by mouth daily.        Marland Kitchen loratadine (CLARITIN) 10 MG tablet Take 10 mg by mouth daily as needed.        . Multiple Vitamins-Minerals (ICAPS MV) TABS Take 1 tablet by mouth daily.      . Multiple Vitamins-Minerals (MULTIVITAMIN PO) Take 1 tablet by mouth daily.      Marland Kitchen diltiazem (CARDIZEM CD) 180 MG 24 hr capsule TAKE 1 CAPSULE DAILY  90 capsule  3             Review of Systems Constitutional:  Negative for fever, chills, activity change and unexpected weight change.  HEENT:  Negative for hearing loss, ear pain, congestion, neck stiffness and postnasal drip. Negative for sore throat or swallowing problems. Negative for dental complaints.   Eyes: Negative for vision loss or change in visual acuity.  Respiratory: Negative for chest tightness and wheezing. Negative for DOE.   Cardiovascular: Negative for chest pain or palpitations. No decreased exercise tolerance Gastrointestinal: No change in bowel habit. No bloating or gas. No reflux or indigestion Genitourinary: Negative for urgency, frequency, flank pain and difficulty urinating.  Musculoskeletal: Negative for myalgias, back pain, arthralgias and gait problem.  Neurological: Negative for dizziness, tremors, weakness and headaches.  Hematological: Negative for adenopathy.  Psychiatric/Behavioral: Negative for behavioral problems and dysphoric mood.       Objective:   Physical Exam Filed Vitals:   01/17/12 1345  BP: 122/88  Pulse: 75  Temp: 97.9 F (36.6 C)  Resp: 16   Wt Readings from Last 3 Encounters:  01/17/12 149 lb (67.586 kg)    10/06/11 151 lb (68.493 kg)  09/22/11 151 lb 6.4 oz (68.675 kg)   Gen'l: well nourished, well developed white Woman in no distress HEENT - [place]/AT, EACs/TMs normal, oropharynx with native dentition in good condition, no buccal or palatal lesions, posterior pharynx clear, mucous membranes moist. C&S clear, PERRLA, fundi - normal Neck - supple, no thyromegaly Nodes- negative submental, cervical, supraclavicular regions Chest - no deformity, no CVAT Lungs - cleat without rales, wheezes. No increased work of breathing Breast - deferred to gyn Cardiovascular - regular rate and rhythm, quiet precordium, no murmurs, rubs or gallops, 2+ radial, DP and PT pulses Abdomen - BS+ x 4, no HSM, no guarding or rebound or tenderness Pelvic - deferred to gyn Rectal - deferred to gyn Extremities - no clubbing, cyanosis, edema or deformity.  Neuro -  A&O x 3, CN II-XII normal, motor strength normal and equal, DTRs 2+ and symmetrical biceps, radial, and patellar tendons. Cerebellar - no tremor, no rigidity, fluid movement and normal gait. Derm - Head, neck, back, abdomen and extremities without suspicious lesions. Full exam deferred to Dr. Nicholas Lose  Lab Results  Component Value Date   WBC 5.6 04/02/2011   HGB 13.1 04/02/2011   HCT 38.5 04/02/2011   PLT 268 04/02/2011   GLUCOSE 86 01/17/2012   CHOL 253* 01/17/2012   TRIG 104.0 01/17/2012   HDL 86.30 01/17/2012   LDLDIRECT 150.0 01/17/2012   ALT 15 01/17/2012   AST 17 01/17/2012   NA 139 01/17/2012   K 4.4 01/17/2012   CL 105 01/17/2012   CREATININE 0.9 01/17/2012   BUN 21 01/17/2012   CO2 25 01/17/2012   TSH 1.93 12/28/2010   HGBA1C 5.4 01/17/2012         Assessment & Plan:

## 2012-01-18 DIAGNOSIS — Z0000 Encounter for general adult medical examination without abnormal findings: Secondary | ICD-10-CM | POA: Insufficient documentation

## 2012-01-18 NOTE — Assessment & Plan Note (Signed)
She has done well. Dr. Darnelle Catalan has released her. She reports that she has stopped tamoxifen. She is current with follow up mammography - no problems.

## 2012-01-18 NOTE — Assessment & Plan Note (Signed)
Had knee pain related to meniscal tear right. Has done well with surgical repair. Completed Rehab in July '13.  Plan - will resume walking for exercise as she continues to recover from surgery.

## 2012-01-18 NOTE — Assessment & Plan Note (Signed)
No reports of recurrent hypoglycemia.  Plan  A1C for 90 day look back.  Addendum: A1C 5.4% - normal range.

## 2012-01-18 NOTE — Assessment & Plan Note (Signed)
Interval medical history significant for repair or torn meniscus, otherwise unremarkable. Physical exam, sans breast and pelvic, is normal. Lab results are in normal limits except for elevated LDL cholesterol. She is current with colorectal cancer screening and follow-up of breast cancer. Immunizations are up to date.   In summary - a very nice woman who is medically stable. She is advised to resume regular exercise now that her knee is healing. She will return in 1 year or as needed.

## 2012-01-18 NOTE — Assessment & Plan Note (Signed)
Last lab with LDL 150, HDL 86, ratio of less than 2. Low risk profile for atherosclerotic disease. Her LDL is below the treatment threshold (NCEP-ATP III) and she prefers to not take medication.  Plan Continue prudent diet  Regular exercise.

## 2012-01-28 ENCOUNTER — Other Ambulatory Visit: Payer: Self-pay | Admitting: Dermatology

## 2012-01-28 DIAGNOSIS — Z85828 Personal history of other malignant neoplasm of skin: Secondary | ICD-10-CM | POA: Diagnosis not present

## 2012-01-28 DIAGNOSIS — D239 Other benign neoplasm of skin, unspecified: Secondary | ICD-10-CM | POA: Diagnosis not present

## 2012-01-28 DIAGNOSIS — D485 Neoplasm of uncertain behavior of skin: Secondary | ICD-10-CM | POA: Diagnosis not present

## 2012-01-28 DIAGNOSIS — L819 Disorder of pigmentation, unspecified: Secondary | ICD-10-CM | POA: Diagnosis not present

## 2012-01-28 DIAGNOSIS — D216 Benign neoplasm of connective and other soft tissue of trunk, unspecified: Secondary | ICD-10-CM | POA: Diagnosis not present

## 2012-01-28 DIAGNOSIS — L821 Other seborrheic keratosis: Secondary | ICD-10-CM | POA: Diagnosis not present

## 2012-02-15 ENCOUNTER — Other Ambulatory Visit: Payer: Self-pay | Admitting: Internal Medicine

## 2012-02-15 DIAGNOSIS — Z1231 Encounter for screening mammogram for malignant neoplasm of breast: Secondary | ICD-10-CM

## 2012-02-28 DIAGNOSIS — Z23 Encounter for immunization: Secondary | ICD-10-CM | POA: Diagnosis not present

## 2012-03-22 ENCOUNTER — Ambulatory Visit
Admission: RE | Admit: 2012-03-22 | Discharge: 2012-03-22 | Disposition: A | Discharge status: Home / Self Care | Payer: Medicare Other | Source: Ambulatory Visit | Attending: Internal Medicine | Admitting: Internal Medicine

## 2012-03-22 DIAGNOSIS — Z1231 Encounter for screening mammogram for malignant neoplasm of breast: Secondary | ICD-10-CM

## 2012-05-25 ENCOUNTER — Telehealth: Payer: Self-pay | Admitting: Internal Medicine

## 2012-05-25 MED ORDER — ALPRAZOLAM 0.25 MG PO TABS: ORAL_TABLET | ORAL | Status: DC

## 2012-05-25 NOTE — Telephone Encounter (Signed)
Ok for alprazolam 0.25 mg every 6 hours as needed for anxiety. May take two at bedtime for sleep as needed. #50, 1 refill

## 2012-05-25 NOTE — Telephone Encounter (Signed)
Please read phone note below and advise. 

## 2012-05-25 NOTE — Telephone Encounter (Signed)
Patient calling, is having issues sleeping and new anxiety.  Her 71 year old mother with dementia is living with her until they can get her in an assisted living facility.   She is asking if there is any mild medication that can be prescribed to help her through this phase? She has never taken any anxiety medications in the past.    Uses [facility] on file.

## 2012-07-14 DIAGNOSIS — L821 Other seborrheic keratosis: Secondary | ICD-10-CM | POA: Diagnosis not present

## 2012-08-29 ENCOUNTER — Telehealth: Payer: Self-pay | Admitting: *Deleted

## 2012-08-29 DIAGNOSIS — Z8639 Personal history of other endocrine, nutritional and metabolic disease: Secondary | ICD-10-CM

## 2012-08-29 DIAGNOSIS — Z0000 Encounter for general adult medical examination without abnormal findings: Secondary | ICD-10-CM

## 2012-08-29 DIAGNOSIS — Z862 Personal history of diseases of the blood and blood-forming organs and certain disorders involving the immune mechanism: Secondary | ICD-10-CM

## 2012-08-29 DIAGNOSIS — H2510 Age-related nuclear cataract, unspecified eye: Secondary | ICD-10-CM | POA: Diagnosis not present

## 2012-08-29 DIAGNOSIS — E785 Hyperlipidemia, unspecified: Secondary | ICD-10-CM

## 2012-08-29 NOTE — Telephone Encounter (Signed)
Left msg on triage requesting labs prior to cpx appt...Raechel Chute

## 2012-08-31 NOTE — Telephone Encounter (Signed)
Ok labs entered.

## 2012-08-31 NOTE — Addendum Note (Signed)
Addended by: Jacques Navy on: 08/31/2012 06:06 PM   Modules accepted: Orders

## 2012-09-01 NOTE — Telephone Encounter (Signed)
Notified pt md ok labs...lmb 

## 2012-10-11 ENCOUNTER — Ambulatory Visit (INDEPENDENT_AMBULATORY_CARE_PROVIDER_SITE_OTHER): Payer: Medicare Other | Admitting: Internal Medicine

## 2012-10-11 ENCOUNTER — Encounter: Payer: Self-pay | Admitting: Internal Medicine

## 2012-10-11 ENCOUNTER — Telehealth: Payer: Self-pay

## 2012-10-11 VITALS — BP 136/84 | HR 69 | Temp 98.5°F | Resp 16 | Wt 151.8 lb

## 2012-10-11 DIAGNOSIS — M5431 Sciatica, right side: Secondary | ICD-10-CM

## 2012-10-11 DIAGNOSIS — M5430 Sciatica, unspecified side: Secondary | ICD-10-CM | POA: Diagnosis not present

## 2012-10-11 MED ORDER — NAPROXEN-ESOMEPRAZOLE 500-20 MG PO TBEC: 1.0000 | DELAYED_RELEASE_TABLET | Freq: Two times a day (BID) | ORAL | Status: DC

## 2012-10-11 NOTE — Telephone Encounter (Signed)
Patient called [facility] stating that she was seen in office visit today and MD discuss referral to PT, however, she was contacted regarding a referral to podiatry. She would like for MD to clarify Thanks

## 2012-10-11 NOTE — Telephone Encounter (Signed)
I have corrected it

## 2012-10-11 NOTE — Addendum Note (Signed)
Addended by: Etta Grandchild on: 10/11/2012 02:57 PM   Modules accepted: Orders

## 2012-10-11 NOTE — Patient Instructions (Signed)
Sciatica °Sciatica is pain, weakness, numbness, or tingling along the path of the sciatic nerve. The nerve starts in the lower back and runs down the back of each leg. The nerve controls the muscles in the lower leg and in the back of the knee, while also providing sensation to the back of the thigh, lower leg, and the sole of your foot. Sciatica is a symptom of another medical condition. For instance, nerve damage or certain conditions, such as a herniated disk or bone spur on the spine, pinch or put pressure on the sciatic nerve. This causes the pain, weakness, or other sensations normally associated with sciatica. Generally, sciatica only affects one side of the body. °CAUSES  °· Herniated or slipped disc. °· Degenerative disk disease. °· A pain disorder involving the narrow muscle in the buttocks (piriformis syndrome). °· Pelvic injury or fracture. °· Pregnancy. °· Tumor (rare). °SYMPTOMS  °Symptoms can vary from mild to very severe. The symptoms usually travel from the low back to the buttocks and down the back of the leg. Symptoms can include: °· Mild tingling or dull aches in the lower back, leg, or hip. °· Numbness in the back of the calf or sole of the foot. °· Burning sensations in the lower back, leg, or hip. °· Sharp pains in the lower back, leg, or hip. °· Leg weakness. °· Severe back pain inhibiting movement. °These symptoms may get worse with coughing, sneezing, laughing, or prolonged sitting or standing. Also, being overweight may worsen symptoms. °DIAGNOSIS  °Your caregiver will perform a physical exam to look for common symptoms of sciatica. He or she may ask you to do certain movements or activities that would trigger sciatic nerve pain. Other tests may be performed to find the cause of the sciatica. These may include: °· Blood tests. °· X-rays. °· Imaging tests, such as an MRI or CT scan. °TREATMENT  °Treatment is directed at the cause of the sciatic pain. Sometimes, treatment is not necessary  and the pain and discomfort goes away on its own. If treatment is needed, your caregiver may suggest: °· Over-the-counter medicines to relieve pain. °· Prescription medicines, such as anti-inflammatory medicine, muscle relaxants, or narcotics. °· Applying heat or ice to the painful area. °· Steroid injections to lessen pain, irritation, and inflammation around the nerve. °· Reducing activity during periods of pain. °· Exercising and stretching to strengthen your abdomen and improve flexibility of your spine. Your caregiver may suggest losing weight if the extra weight makes the back pain worse. °· Physical therapy. °· Surgery to eliminate what is pressing or pinching the nerve, such as a bone spur or part of a herniated disk. °HOME CARE INSTRUCTIONS  °· Only take over-the-counter or prescription medicines for pain or discomfort as directed by your caregiver. °· Apply ice to the affected area for 20 minutes, 3 4 times a day for the first 48 72 hours. Then try heat in the same way. °· Exercise, stretch, or perform your usual activities if these do not aggravate your pain. °· Attend physical therapy sessions as directed by your caregiver. °· Keep all follow-up appointments as directed by your caregiver. °· Do not wear high heels or shoes that do not provide proper support. °· Check your mattress to see if it is too soft. A firm mattress may lessen your pain and discomfort. °SEEK IMMEDIATE MEDICAL CARE IF:  °· You lose control of your bowel or bladder (incontinence). °· You have increasing weakness in the lower back,   pelvis, buttocks, or legs. °· You have redness or swelling of your back. °· You have a burning sensation when you urinate. °· You have pain that gets worse when you lie down or awakens you at night. °· Your pain is worse than you have experienced in the past. °· Your pain is lasting longer than 4 weeks. °· You are suddenly losing weight without reason. °MAKE SURE YOU: °· Understand these  instructions. °· Will watch your condition. °· Will get help right away if you are not doing well or get worse. °Document Released: 05/04/2001 Document Revised: 11/09/2011 Document Reviewed: 09/19/2011 °[employer]® Patient Information ©2014 [employer]. ° °

## 2012-10-11 NOTE — Assessment & Plan Note (Signed)
She was given pt ed material about this She is willing to start PT I have asked her to try vimovo for the pain

## 2012-10-11 NOTE — Progress Notes (Signed)
Subjective:    Patient ID: Jessica Woodward, female    DOB: 1941/04/30, 72 y.o.   MRN: [id number]  HPI  New to me she complains of aching/throbbing pain that radiates from her right thigh into her right LE for several months. The pain is worsened by sitting in the car for several hours and it hurts while she is sleeping as well. The pain is not particularly worsened or relieved by exercise or activity. She has not had an injury. She has taken low doses of ibuprofen without much relief from the pain.  Review of Systems  Constitutional: Negative.  Negative for activity change.  HENT: Negative.   Eyes: Negative.   Respiratory: Negative.  Negative for shortness of breath, wheezing and stridor.   Cardiovascular: Negative.  Negative for chest pain, palpitations and leg swelling.  Gastrointestinal: Negative.  Negative for abdominal pain.  Endocrine: Negative.   Genitourinary: Negative.  Negative for frequency, hematuria and flank pain.  Musculoskeletal: Positive for arthralgias (RLE). Negative for myalgias, back pain, joint swelling and gait problem.  Allergic/Immunologic: Negative.   Neurological: Negative.  Negative for dizziness, tremors, weakness and numbness.  Hematological: Negative for adenopathy. Does not bruise/bleed easily.  Psychiatric/Behavioral: Negative.        Objective:   Physical Exam  Vitals reviewed. Constitutional: She appears well-developed and well-nourished. No distress.  HENT:  Head: Normocephalic and atraumatic.  Mouth/Throat: No oropharyngeal exudate.  Eyes: Conjunctivae are normal. Right eye exhibits no discharge. Left eye exhibits no discharge. No scleral icterus.  Neck: Normal range of motion. Neck supple. No JVD present. No tracheal deviation present. No thyromegaly present.  Cardiovascular: Normal rate, regular rhythm, normal heart sounds and intact distal pulses.  Exam reveals no gallop and no friction rub.   No murmur heard. Pulses:      Carotid pulses are  1+ on the right side, and 1+ on the left side.      Radial pulses are 1+ on the right side, and 1+ on the left side.       Femoral pulses are 1+ on the right side, and 1+ on the left side.      Popliteal pulses are 1+ on the right side, and 1+ on the left side.       Dorsalis pedis pulses are 1+ on the right side, and 1+ on the left side.       Posterior tibial pulses are 1+ on the right side, and 1+ on the left side.  Pulmonary/Chest: Effort normal and breath sounds normal. No stridor. No respiratory distress. She has no wheezes. She has no rales. She exhibits no tenderness.  Abdominal: Soft. Bowel sounds are normal. She exhibits no distension and no mass. There is no tenderness. There is no rebound and no guarding.  Musculoskeletal: Normal range of motion. She exhibits no edema and no tenderness.       Right hip: Normal. She exhibits normal range of motion, normal strength, no tenderness, no bony tenderness, no swelling, no crepitus, no deformity and no laceration.       Right knee: Normal.       Lumbar back: Normal. She exhibits normal range of motion, no tenderness, no bony tenderness, no swelling, no edema, no deformity, no laceration, no pain, no spasm and normal pulse.       Right upper leg: Normal.       Right lower leg: Normal. She exhibits no tenderness, no bony tenderness, no swelling, no edema, no deformity and no  laceration.  Lymphadenopathy:    She has no cervical adenopathy.  Neurological: She is alert. She displays normal reflexes. No cranial nerve deficit. She exhibits normal muscle tone. Coordination normal.  Reflex Scores:      Tricep reflexes are 1+ on the right side and 1+ on the left side.      Bicep reflexes are 1+ on the right side and 1+ on the left side.      Brachioradialis reflexes are 1+ on the right side and 1+ on the left side.      Patellar reflexes are 1+ on the right side and 1+ on the left side.      Achilles reflexes are 1+ on the right side and 1+ on the  left side. Neg SLR in BLE  Skin: Skin is warm and dry. No rash noted. She is not diaphoretic. No erythema. No pallor.  Psychiatric: She has a normal mood and affect. Her behavior is normal. Judgment and thought content normal.          Assessment & Plan:

## 2012-10-23 ENCOUNTER — Telehealth: Payer: Self-pay | Admitting: *Deleted

## 2012-10-23 DIAGNOSIS — M5431 Sciatica, right side: Secondary | ICD-10-CM

## 2012-10-23 NOTE — Telephone Encounter (Signed)
Ask her to see PT

## 2012-10-23 NOTE — Telephone Encounter (Signed)
Pt states she was seen recently in office for sciatica and MD said that he would give her a list of exercises she can do. Pt did not receive list at [facility] and would like copy of exercise she can do for sciatica sent to her.

## 2012-10-23 NOTE — Telephone Encounter (Signed)
Pt informed of MD's advisement, has appointment with PT scheduled next Tuesday.

## 2012-10-31 ENCOUNTER — Ambulatory Visit (HOSPITAL_COMMUNITY)
Admission: RE | Admit: 2012-10-31 | Discharge: 2012-10-31 | Disposition: A | Discharge status: Home / Self Care | Payer: Medicare Other | Source: Ambulatory Visit | Attending: Internal Medicine | Admitting: Internal Medicine

## 2012-10-31 DIAGNOSIS — IMO0001 Reserved for inherently not codable concepts without codable children: Secondary | ICD-10-CM | POA: Diagnosis not present

## 2012-10-31 DIAGNOSIS — R262 Difficulty in walking, not elsewhere classified: Secondary | ICD-10-CM | POA: Diagnosis not present

## 2012-10-31 DIAGNOSIS — M79609 Pain in unspecified limb: Secondary | ICD-10-CM | POA: Diagnosis not present

## 2012-10-31 DIAGNOSIS — M25559 Pain in unspecified hip: Secondary | ICD-10-CM | POA: Insufficient documentation

## 2012-11-01 DIAGNOSIS — M5416 Radiculopathy, lumbar region: Secondary | ICD-10-CM | POA: Insufficient documentation

## 2012-11-01 NOTE — Evaluation (Signed)
Physical Therapy Evaluation  Patient Details  Name: Jessica Woodward MRN: [id number] Date of Birth: [birth year]  Today's Date: 11/01/2012 Time: 0800-0845 PT Time Calculation (min): 45 min Charges Evaluation: 1 TE: [id number]             Visit#: 1 of 8  Re-eval: 12/01/12 Assessment Diagnosis: Radiculopathy to RLE Next MD Visit: Dr. Yetta Barre - July 2  Authorization: Medicare    Authorization Time Period:    Authorization Visit#: 1 of 10   Past Medical History:  Past Medical History  Diagnosis Date  . DUCTAL CARCINOMA IN SITU, LEFT BREAST 2002    s/p left lumpectomy, xrt and tamoxifen  . CARCINOMA, SQUAMOUS CELL, HX OF 06/05/2007  . RADIATION THERAPY, HX OF   . Seasonal allergies    Past Surgical History:  Past Surgical History  Procedure Laterality Date  . Dilation and curettage of uterus    . Left knee surgery    . Abdominal hysterectomy    . Breast surgery      Left breast lumpectomy with axillary sential node  . Right breast lupectomy    . Tubal ligation    . Mohs surgery      (R) arm  . Tonsillectomy and adenoidectomy    . Athroscopy right knee  2013    torn miniscus    Subjective Symptoms/Limitations Pertinent History: Pt is referred to PT for RLE radiculopathy which started about 3 months ago.  C/co is pain to her Rt hip down to her ankle and crosses the foot, difficulty walking far, difficulty descending stairs, difficulty sitting and traveling.  She reports she has an upcoming trip to [place] this Sunday and will be there for 10 days.  She is interested in exercises to help control her pain.  Limitations: Sitting;Standing;Walking;House hold activities;Other (comment) (exercise) How long can you walk comfortably?: 1/4 mile at most Patient Stated Goals: To elimanate pain to her RLE to participate in her activities at the [facility].  Pain Assessment Currently in Pain?: Yes Pain Score:   6 Pain Location: Leg Pain Orientation: Right Pain Type: Neuropathic pain Pain  Radiating Towards: RLE Pain Onset: 1 to 4 weeks ago Pain Frequency: Intermittent Pain Relieving Factors: ibuprofen Effect of Pain on Daily Activities: difficulty attending [employer] classes, and difficulty swimming  Precautions/Restrictions   None  Balance Screening Balance Screen Has the patient fallen in the past 6 months: No Has the patient had a decrease in activity level because of a fear of falling? : No Is the patient reluctant to leave their home because of a fear of falling? : No  Prior Functioning  Prior Function Vocation Requirements: works for a Dentist Comments: enjoys working out at [employer], traveling for [employer]  Sensation/Coordination/Flexibility/Functional Tests Coordination Gross Motor Movements are Fluid and Coordinated: Yes Coordination and Movement Description: independentl with Transverese abdominus and multiifdus activaition.   Assessment RLE Assessment RLE Assessment: Within Functional Limits LLE Assessment LLE Assessment: Within Functional Limits Lumbar Assessment Lumbar Assessment: Within Functional Limits Palpation Palpation: pain and tenderenss to Rt proximal IT band and tensor fascia lata with moderat fascial restrictions.  Denies pain to gluteal and lumbar erector spinae  Mobility/Balance  Ambulation/Gait Ambulation/Gait: Yes Gait Pattern: Antalgic Posture/Postural Control Posture/Postural Control: Postural limitations Postural Limitations: mild slouched posture   Exercise/Treatments Stretches Active Hamstring Stretch: 3 reps;30 seconds Prone on Elbows Stretch: 1 rep;60 seconds Quad Stretch: 3 reps;30 seconds Standing Other Standing Lumbar Exercises: back bends 10 reps Seated Other Seated Lumbar Exercises: Heel  roll outs 3x10 sec holds Supine Ab Set: 5 reps;Limitations AB Set Limitations: 10 sec holds Clam: 5 reps;Limitations Clam Limitations: w/ab set, alternating leg Bent Knee Raise: 5 reps;Limitations Bent  Knee Raise Limitations: w/ab set alternating leg Prone  Other Prone Lumbar Exercises: multifidus 5x10 sec holds Other Prone Lumbar Exercises: Hip IR 10 reps  Physical Therapy Assessment and Plan PT Assessment and Plan Clinical Impression Statement: Pt is a 72 year old active female referred to PT for lumbar radiculopathy to RLE with following impairnments listed below.  At this time she is independent with core coordination and is weaker to Rt multifdus compared to Lt multifidus.  Has moderate fascial restrictions to RLE which may be causing local entrapment of the nerve. Pt reports decreased pain and improved symptoms after exercises.  Pt will benefit from skilled therapeutic intervention in order to improve on the following deficits: Pain;Improper body mechanics;Impaired flexibility;Decreased strength;Increased fascial restricitons Rehab Potential: Good PT Frequency: Min 2X/week PT Duration: 6 weeks PT Treatment/Interventions: Therapeutic exercise;Therapeutic activities;Balance training;Neuromuscular re-education;Patient/family education;Manual techniques PT Plan: 6 weeks requested as pt will be traveling for 10 days. Continue with prone activities to decrease fascial restrictions, check SI alignment, continue with core stabalization exercises.     Goals Home Exercise Program Pt will Perform Home Exercise Program: Independently PT Goal: Perform Home Exercise Program - Progress: Goal set today PT Short Term Goals Time to Complete Short Term Goals: 3 weeks PT Short Term Goal 1: Pt will report pain less than 3/10 for 75% of her day for improved QOL.  PT Short Term Goal 2: Pt will improve her Rt multifidus coordination to equal of Lt to decrease radicular symptoms.  PT Long Term Goals Time to Complete Long Term Goals:  (6 weeks) PT Long Term Goal 1: Pt will report pain less than 3/10 for 75% of her day for improved QOL. PT Long Term Goal 2: Pt will present with minimal fascial restritions to  RLE and lumbar region for decreased pain.  Long Term Goal 3: Pt will improve core strength in order to tolerate sitting for greater than 2 hours to continue with comfort with road trips and continue with exercise activities.   Problem List Patient Active Problem List   Diagnosis Date Noted  . Lumbar radiculopathy 11/01/2012  . Sciatica neuralgia 10/11/2012  . Routine health maintenance 01/18/2012  . Hyperlipidemia 01/17/2012  . History of meniscal tear 12/10/2011  . DUCTAL CARCINOMA IN SITU, LEFT BREAST 06/05/2007  . HYPOGLYCEMIA, HX OF 06/05/2007    General Behavior During Therapy: [facility] for tasks assessed/performed Cognition: [facility] for tasks performed PT Plan of Care PT Home Exercise Plan: see scanned report PT Patient Instructions: encouraged using pool, Discussed:  importance of extension activities, proper sitting posture and compensations in the car to decrease radicular pain, HEP and answered questions about diagnosis Consulted and Agree with Plan of Care: Patient  GP Functional Assessment Tool Used: clinical observation Functional Limitation: Self care Self Care Current Status (W0981): At least 20 percent but less than 40 percent impaired, limited or restricted Self Care Goal Status (X9147): At least 1 percent but less than 20 percent impaired, limited or restricted  Bracken Moffa, MPT ATC 11/01/2012, 8:45 AM  Physician Documentation Your signature is required to indicate approval of the treatment plan as stated above.  Please sign and either send electronically or make a copy of this report for your files and return this physician signed original.   Please mark one 1.__approve of plan  2.  ___approve of plan with the following conditions.   ______________________________                                                          _____________________ Physician Signature                                                                                                              Date

## 2012-11-22 ENCOUNTER — Encounter: Payer: Self-pay | Admitting: Internal Medicine

## 2012-11-22 ENCOUNTER — Ambulatory Visit (INDEPENDENT_AMBULATORY_CARE_PROVIDER_SITE_OTHER): Payer: Medicare Other | Admitting: Internal Medicine

## 2012-11-22 VITALS — BP 128/90 | HR 67 | Temp 97.4°F | Resp 12 | Ht 64.0 in | Wt 151.0 lb

## 2012-11-22 DIAGNOSIS — M5431 Sciatica, right side: Secondary | ICD-10-CM

## 2012-11-22 DIAGNOSIS — M5430 Sciatica, unspecified side: Secondary | ICD-10-CM

## 2012-11-22 NOTE — Progress Notes (Signed)
  Subjective:    Patient ID: Jessica Woodward, female    DOB: July 26, 1940, 72 y.o.   MRN: [id number]  HPI Jessica Woodward returns for follow up of sciatica - she was seen by Dr. Yetta Barre - note reviewed. No imaging studies done. She did have one PT session and since has done well doing PT on her own. Her symptoms are 90% resolved. She does still take low dose ibuprofen - did not tolerate vimovo.  PMH, FamHx and SocHx reviewed for any changes and relevance. Current Outpatient Prescriptions on File Prior to Visit  Medication Sig Dispense Refill  . aspirin 81 MG tablet Take 81 mg by mouth daily.        Marland Kitchen diltiazem (CARDIZEM CD) 180 MG 24 hr capsule Take 1 capsule (180 mg total) by mouth daily.  90 capsule  3  . Glucosamine-Chondroit-Vit C-Mn (GLUCOSAMINE-CHONDROITIN) TABS Take by mouth daily.        Marland Kitchen loratadine (CLARITIN) 10 MG tablet Take 10 mg by mouth daily as needed.        . Multiple Vitamins-Minerals (ICAPS MV) TABS Take 1 tablet by mouth daily.      . Multiple Vitamins-Minerals (MULTIVITAMIN PO) Take 1 tablet by mouth daily.       No current facility-administered medications on file prior to visit.      Review of Systems System review is negative for any constitutional, cardiac, pulmonary, GI or neuro symptoms or complaints other than as described in the HPI.     Objective:   Physical Exam Filed Vitals:   11/22/12 0922  BP: 128/90  Pulse: 67  Temp: 97.4 F (36.3 C)  Resp: 12   gen'l- WNWD white woman in no distress Cor - RRR PUlm - normal MSK - Back exam: normal stand; normal flex to greater than 100 degrees; normal gait; normal toe/heel walk; normal step up to exam table; normal SLR sitting; normal DTRs at the patellar tendons; normal sensation to light touch, pin-prick and deep vibratory stimulus; no  CVA tenderness; able to move supine to sitting witout assistance.        Assessment & Plan:

## 2012-11-22 NOTE — Assessment & Plan Note (Signed)
sympotms of sciatica on the right have pretty much resolved. Exam today without radiculopathy.  Plan Continue exercise  Continue ibuprofen  ROV prn

## 2012-11-22 NOTE — Patient Instructions (Addendum)
Your sciatica seems to have mostly resolved and your exam today is without any sign of a pinched nerve.  The vimovo contains naproxen (aleve) and esomeprazole (nexium) and the later probably made you feel bad  Plan Continue the exercises  Ibuprofen as needed - watch for gastric irritation  Call for recurrent

## 2012-12-19 ENCOUNTER — Telehealth: Payer: Self-pay | Admitting: *Deleted

## 2012-12-19 NOTE — Telephone Encounter (Signed)
Haven't seen yet. Will complete ASAP once recieved

## 2012-12-19 NOTE — Telephone Encounter (Signed)
Pt called requesting status of Concealed Carry Permit certification form.  Further states you should have been received correspondence from [facility].  Please advise.

## 2012-12-19 NOTE — Telephone Encounter (Signed)
Pt advised.

## 2012-12-26 ENCOUNTER — Telehealth: Payer: Self-pay

## 2012-12-26 ENCOUNTER — Encounter: Payer: Self-pay | Admitting: Internal Medicine

## 2012-12-26 NOTE — Telephone Encounter (Signed)
Phone call from patient stating she now needs a verification letter faxed to [facility] dept stating she is not under treatment for mental illness to get her concealed carry permit renewed.

## 2013-01-01 NOTE — Telephone Encounter (Signed)
Letter has been faxed to [facility] [employer] [phone]

## 2013-01-31 DIAGNOSIS — L819 Disorder of pigmentation, unspecified: Secondary | ICD-10-CM | POA: Diagnosis not present

## 2013-01-31 DIAGNOSIS — D235 Other benign neoplasm of skin of trunk: Secondary | ICD-10-CM | POA: Diagnosis not present

## 2013-01-31 DIAGNOSIS — D1801 Hemangioma of skin and subcutaneous tissue: Secondary | ICD-10-CM | POA: Diagnosis not present

## 2013-01-31 DIAGNOSIS — Z85828 Personal history of other malignant neoplasm of skin: Secondary | ICD-10-CM | POA: Diagnosis not present

## 2013-01-31 DIAGNOSIS — D239 Other benign neoplasm of skin, unspecified: Secondary | ICD-10-CM | POA: Diagnosis not present

## 2013-01-31 DIAGNOSIS — L821 Other seborrheic keratosis: Secondary | ICD-10-CM | POA: Diagnosis not present

## 2013-02-12 ENCOUNTER — Other Ambulatory Visit: Payer: Self-pay

## 2013-02-12 DIAGNOSIS — Z1231 Encounter for screening mammogram for malignant neoplasm of breast: Secondary | ICD-10-CM

## 2013-02-27 ENCOUNTER — Other Ambulatory Visit (INDEPENDENT_AMBULATORY_CARE_PROVIDER_SITE_OTHER): Payer: Medicare Other

## 2013-02-27 ENCOUNTER — Ambulatory Visit (INDEPENDENT_AMBULATORY_CARE_PROVIDER_SITE_OTHER): Payer: Medicare Other | Admitting: Internal Medicine

## 2013-02-27 ENCOUNTER — Encounter: Payer: Self-pay | Admitting: Internal Medicine

## 2013-02-27 VITALS — BP 130/80 | HR 68 | Temp 97.8°F | Ht 64.0 in | Wt 149.0 lb

## 2013-02-27 DIAGNOSIS — M5431 Sciatica, right side: Secondary | ICD-10-CM

## 2013-02-27 DIAGNOSIS — E785 Hyperlipidemia, unspecified: Secondary | ICD-10-CM

## 2013-02-27 DIAGNOSIS — Z862 Personal history of diseases of the blood and blood-forming organs and certain disorders involving the immune mechanism: Secondary | ICD-10-CM

## 2013-02-27 DIAGNOSIS — Z87828 Personal history of other (healed) physical injury and trauma: Secondary | ICD-10-CM

## 2013-02-27 DIAGNOSIS — D0590 Unspecified type of carcinoma in situ of unspecified breast: Secondary | ICD-10-CM | POA: Diagnosis not present

## 2013-02-27 DIAGNOSIS — Z23 Encounter for immunization: Secondary | ICD-10-CM

## 2013-02-27 DIAGNOSIS — D0592 Unspecified type of carcinoma in situ of left breast: Secondary | ICD-10-CM

## 2013-02-27 DIAGNOSIS — Z0000 Encounter for general adult medical examination without abnormal findings: Secondary | ICD-10-CM

## 2013-02-27 LAB — COMPREHENSIVE METABOLIC PANEL
ALT: 12 U/L (ref 0–35)
AST: 15 U/L (ref 0–37)
Albumin: 4.1 g/dL (ref 3.5–5.2)
Alkaline Phosphatase: 74 U/L (ref 39–117)
BUN: 21 mg/dL (ref 6–23)
CO2: 29 mEq/L (ref 19–32)
Calcium: 9.8 mg/dL (ref 8.4–10.5)
Chloride: 108 mEq/L (ref 96–112)
Creatinine, Ser: 0.9 mg/dL (ref 0.4–1.2)
GFR: 66.32 mL/min (ref >60.00)
Glucose, Bld: 90 mg/dL (ref 70–99)
Potassium: 4.5 mEq/L (ref 3.5–5.1)
Sodium: 142 mEq/L (ref 135–145)
Total Bilirubin: 0.4 mg/dL (ref 0.3–1.2)
Total Protein: 7 g/dL (ref 6.0–8.3)

## 2013-02-27 LAB — LDL CHOLESTEROL, DIRECT: Direct LDL: 171.5 mg/dL

## 2013-02-27 LAB — LIPID PANEL
Cholesterol: 265 mg/dL — ABNORMAL HIGH (ref 0–200)
HDL: 70.2 mg/dL (ref >39.00)
Total CHOL/HDL Ratio: 4
Triglycerides: 158 mg/dL — ABNORMAL HIGH (ref 0.0–149.0)
VLDL: 31.6 mg/dL (ref 0.0–40.0)

## 2013-02-27 MED ORDER — DILTIAZEM HCL ER COATED BEADS 180 MG PO CP24: 180.0000 mg | ORAL_CAPSULE | Freq: Every day | ORAL | Status: DC

## 2013-02-27 NOTE — Patient Instructions (Addendum)
Good to see you and thank you for the warm afgan.  Your exam is normal.  Labs today with results posted to MyChart  You are current with all immunizations except for a new pneumonia vaccine that is recommended: Prevnar 13 - you can have this in a couple of weeks. Call for a nurse visit.   It is great to see you doing so well.

## 2013-02-28 LAB — CANCER ANTIGEN 27.29: CA 27.29: 42 U/mL — ABNORMAL HIGH (ref 0–39)

## 2013-02-28 NOTE — Assessment & Plan Note (Signed)
No report of any hypoglycemic events. Serum glucose is normal at 90.  Plan No further testing except for routine labs.

## 2013-02-28 NOTE — Assessment & Plan Note (Signed)
By her report no problem with pain or limitation in activity.

## 2013-02-28 NOTE — Assessment & Plan Note (Signed)
Rise in LDL to above 160: in 2013 ZOX=096, now 171.5! She admits to decreased exercise and dietary vigilance. HDL remains robust. Framingham Cardiac risk calculation - 4% risk cardiovascular next 10 years  Plan Will discuss options with patient via MyChart: 6 month trial diet and exercise vs starting statin medication.

## 2013-02-28 NOTE — Assessment & Plan Note (Signed)
Jessica Woodward reports resolution of "sciatic" pain.

## 2013-02-28 NOTE — Progress Notes (Signed)
Patient ID: Jessica Woodward, female DOB: April 06, 1941, 72 y.o. MRN: [id number]  HPI  The patient is here for annual Medicare wellness examination and management of other chronic and acute problems.  Interval history - medically stable w/o major illness, no surgery and no injury. No pain from sciatica.  The risk factors are reflected in the social history.  The roster of all physicians providing medical care to patient - is listed in the Snapshot section of the chart.  Activities of daily living: The patient is 100% inedpendent in all ADLs: dressing, toileting, feeding as well as independent mobility  Home safety : The patient has smoke detectors in the home. Falls - none, home is fall safe. They wear seatbelts. firearms are present in the home, kept in a safe fashion.  There is no risks for hepatitis, STDs or HIV. There is no history of blood transfusion. They have no travel history to infectious disease endemic areas of the world.  The patient has seen their dentist in the last six month. They have seen their eye doctor in the last year. They deny any hearing difficulty and have not had audiologic testing in the last year.  They do not have excessive sun exposure. Discussed the need for sun protection: hats, long sleeves and use of sunscreen if there is significant sun exposure.  Diet: the importance of a healthy diet is discussed. They do have a healthy diet.  The patient has a regular exercise program: walking/low impact aerobics/light weight training , 45 min duration, 3 per week. The benefits of regular aerobic exercise were discussed.  Depression screen: there are no signs or vegative symptoms of depression- irritability, change in appetite, anhedonia, sadness/tearfullness.  Cognitive assessment: the patient manages all their financial and personal affairs and is actively engaged.  The following portions of the patient's history were reviewed and updated as appropriate: allergies, current  medications, past family history, past medical history, past surgical history, past social history and problem list.  Vision, hearing, body mass index were assessed and reviewed.  During the course of the visit the patient was educated and counseled about appropriate screening and preventive services including : fall prevention , diabetes screening, nutrition counseling, colorectal cancer screening, and recommended immunizations.  Past Medical History   Diagnosis  Date   .  DUCTAL CARCINOMA IN SITU, LEFT BREAST  2002     s/p left lumpectomy, xrt and tamoxifen   .  CARCINOMA, SQUAMOUS CELL, HX OF  06/05/2007   .  RADIATION THERAPY, HX OF    .  Seasonal allergies     Past Surgical History   Procedure  Laterality  Date   .  Dilation and curettage of uterus     .  Left knee surgery     .  Abdominal hysterectomy     .  Breast surgery       Left breast lumpectomy with axillary sential node   .  Right breast lupectomy     .  Tubal ligation     .  Mohs surgery       (R) arm   .  Tonsillectomy and adenoidectomy     .  Athroscopy right knee   2013     torn miniscus    Family History   Problem  Relation  Age of Onset   .  Coronary artery disease  Mother    .  Macular degeneration  Father    .  Coronary artery disease  Father  History    Social History   .  Marital Status:  Married     Spouse Name:  N/A     Number of Children:  N/A   .  Years of Education:  N/A    Occupational History   .  Not on file.    Social History Main Topics   .  Smoking status:  Former Smoker     Quit date:  12/28/1963   .  Smokeless tobacco:  Never Used   .  Alcohol Use:  No      Comment: rare glass of wine   .  Drug Use:  No   .  Sexual Activity:  Yes     Partners:  Male    Other Topics  Concern   .  Not on file    Social History Narrative    [employer], [employer] college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 married '60. 2 boys - '62, '73; 11 grandchildren; 3 great-grandchildren. retired, marriage in good  health.    Current Outpatient Prescriptions on File Prior to Visit   Medication  Sig  Dispense  Refill   .  aspirin 81 MG tablet  Take 81 mg by mouth daily.     Marland Kitchen  diltiazem (CARDIZEM CD) 180 MG 24 hr capsule  Take 1 capsule (180 mg total) by mouth daily.  90 capsule  3   .  Glucosamine-Chondroit-Vit C-Mn (GLUCOSAMINE-CHONDROITIN) TABS  Take by mouth daily.     Marland Kitchen  loratadine (CLARITIN) 10 MG tablet  Take 10 mg by mouth daily as needed.     .  Multiple Vitamins-Minerals (ICAPS MV) TABS  Take 1 tablet by mouth daily.     .  Multiple Vitamins-Minerals (MULTIVITAMIN PO)  Take 1 tablet by mouth daily.      No current facility-administered medications on file prior to visit.    Review of Systems  Constitutional: Negative for fever, chills, activity change and unexpected weight change.  HEENT: Negative for hearing loss, ear pain, congestion, neck stiffness and postnasal drip. Negative for sore throat or swallowing problems. Negative for dental complaints.  Eyes: Negative for vision loss or change in visual acuity.  Respiratory: Negative for chest tightness and wheezing. Negative for DOE.  Cardiovascular: Negative for chest pain or palpitations. No decreased exercise tolerance  Gastrointestinal: No change in bowel habit. No bloating or gas. No reflux or indigestion  Genitourinary: Negative for urgency, frequency, flank pain and difficulty urinating.  Musculoskeletal: Negative for myalgias, back pain, arthralgias and gait problem.  Neurological: Negative for dizziness, tremors, weakness and headaches.  Hematological: Negative for adenopathy.  Psychiatric/Behavioral: Negative for behavioral problems and dysphoric mood.   Objective:   Physical Exam  Filed Vitals:    02/27/13 0858   BP:  130/80   Pulse:  68   Temp:  97.8 F (36.6 C)    Wt Readings from Last 3 Encounters:   02/27/13  149 lb (67.586 kg)   11/22/12  151 lb (68.493 kg)   10/11/12  151 lb 12 oz (68.833 kg)    Gen'l: well  nourished, well developed Woman in no distress  HEENT - /AT, EACs/TMs normal, oropharynx with native dentition in good condition, no buccal or palatal lesions, posterior pharynx clear, mucous membranes moist. C&S clear, PERRLA, fundi - normal  Neck - supple, no thyromegaly  Nodes- negative submental, cervical, supraclavicular regions  Chest - no deformity, no CVAT  Lungs - clear without rales, wheezes. No increased work of breathing  Breast - - Skin normal, well healed scar left breast, nipples w/o discharge, no fixed mass or lesion, no axillary adenopathy.  Cardiovascular - regular rate and rhythm, quiet precordium, no murmurs, rubs or gallops, 2+ radial, DP and PT pulses  Abdomen - BS+ x 4, no HSM, no guarding or rebound or tenderness  Pelvic - deferred to gyn  Rectal - deferred to gyn  Extremities - no clubbing, cyanosis, edema or deformity.  Neuro - A&O x 3, CN II-XII normal, motor strength normal and equal, DTRs 2+ and symmetrical biceps, radial, and patellar tendons. Cerebellar - no tremor, no rigidity, fluid movement and normal gait.  Derm - Head, neck, back, abdomen and extremities without suspicious lesions  Recent Results (from the past 2160 hour(s))   CANCER ANTIGEN 27.29 Status: Abnormal    Collection Time    02/27/13 9:59 AM   Result  Value  Range    CA 27.29  42 (*)  0 - 39 U/mL   COMPREHENSIVE METABOLIC PANEL Status: None    Collection Time    02/27/13 9:59 AM   Result  Value  Range    Sodium  142  135 - 145 mEq/L    Potassium  4.5  3.5 - 5.1 mEq/L    Chloride  108  96 - 112 mEq/L    CO2  29  19 - 32 mEq/L    Glucose, Bld  90  70 - 99 mg/dL    BUN  21  6 - 23 mg/dL    Creatinine, Ser  0.9  0.4 - 1.2 mg/dL    Total Bilirubin  0.4  0.3 - 1.2 mg/dL    Alkaline Phosphatase  74  39 - 117 U/L    AST  15  0 - 37 U/L    ALT  12  0 - 35 U/L    Total Protein  7.0  6.0 - 8.3 g/dL    Albumin  4.1  3.5 - 5.2 g/dL    Calcium  9.8  8.4 - 10.5 mg/dL    GFR  16.10  >96.04 mL/min    LIPID PANEL Status: Abnormal    Collection Time    02/27/13 9:59 AM   Result  Value  Range    Cholesterol  265 (*)  0 - 200 mg/dL    Comment:  ATP III Classification Desirable: < 200 mg/dL Borderline High: 540 - 239 mg/dL High: > = 981 mg/dL    Triglycerides  191.4 (*)  0.0 - 149.0 mg/dL    Comment:  Normal: <782 mg/dLBorderline High: 150 - 199 mg/dL    HDL  95.62  >13.08 mg/dL    VLDL  65.7  0.0 - 84.6 mg/dL    Total CHOL/HDL Ratio  4     Comment:  Men Women1/2 Average Risk 3.4 3.3Average Risk 5.0 4.42X Average Risk 9.6 7.13X Average Risk 15.0 11.0   LDL CHOLESTEROL, DIRECT Status: None    Collection Time    02/27/13 9:59 AM   Result  Value  Range    Direct LDL  171.5     Comment:  Optimal: <100 mg/dLNear or Above Optimal: 100-129 mg/dLBorderline High: 130-159 mg/dLHigh: 160-189 mg/dLVery High: >190 mg/dL

## 2013-02-28 NOTE — Assessment & Plan Note (Signed)
Normal breast exam. Mammogram pending. CA 27.29 w/o change from last assay.  Plan Continued surveillance

## 2013-02-28 NOTE — Assessment & Plan Note (Signed)
Interval history w'/o major illness, surgery or injury. Physical exam is unremarkable. Lab results notable for elevated LDL otherwise fine. She is current with colorectal cancer screening. Breast cancer surveillance -  Stable. Mammogram coming up soon. Immunizations are current - she will return in several weeks fro Prevnar 13.  In summary - a very nice woman who is medically stable except for rising LDL cholesterol.

## 2013-03-01 ENCOUNTER — Encounter: Payer: Self-pay | Admitting: Internal Medicine

## 2013-03-16 ENCOUNTER — Encounter: Payer: Self-pay | Admitting: Internal Medicine

## 2013-03-16 DIAGNOSIS — E785 Hyperlipidemia, unspecified: Secondary | ICD-10-CM

## 2013-03-19 MED ORDER — ATORVASTATIN CALCIUM 10 MG PO TABS: 10.0000 mg | ORAL_TABLET | Freq: Every day | ORAL | Status: DC

## 2013-03-23 ENCOUNTER — Other Ambulatory Visit (INDEPENDENT_AMBULATORY_CARE_PROVIDER_SITE_OTHER): Payer: Medicare Other

## 2013-03-23 ENCOUNTER — Ambulatory Visit
Admission: RE | Admit: 2013-03-23 | Discharge: 2013-03-23 | Disposition: A | Discharge status: Home / Self Care | Payer: Medicare Other | Source: Ambulatory Visit

## 2013-03-23 DIAGNOSIS — E785 Hyperlipidemia, unspecified: Secondary | ICD-10-CM

## 2013-03-23 DIAGNOSIS — Z1231 Encounter for screening mammogram for malignant neoplasm of breast: Secondary | ICD-10-CM | POA: Diagnosis not present

## 2013-03-23 LAB — HEPATIC FUNCTION PANEL
ALT: 13 U/L (ref 0–35)
AST: 19 U/L (ref 0–37)
Albumin: 4.3 g/dL (ref 3.5–5.2)
Alkaline Phosphatase: 76 U/L (ref 39–117)
Bilirubin, Direct: 0 mg/dL (ref 0.0–0.3)
Total Bilirubin: 0.7 mg/dL (ref 0.3–1.2)
Total Protein: 7.3 g/dL (ref 6.0–8.3)

## 2013-03-23 LAB — LIPID PANEL
Cholesterol: 255 mg/dL — ABNORMAL HIGH (ref 0–200)
HDL: 75.5 mg/dL (ref >39.00)
Total CHOL/HDL Ratio: 3
Triglycerides: 100 mg/dL (ref 0.0–149.0)
VLDL: 20 mg/dL (ref 0.0–40.0)

## 2013-03-23 LAB — LDL CHOLESTEROL, DIRECT: Direct LDL: 166.9 mg/dL

## 2013-03-26 ENCOUNTER — Other Ambulatory Visit: Payer: Self-pay | Admitting: Internal Medicine

## 2013-03-26 DIAGNOSIS — E785 Hyperlipidemia, unspecified: Secondary | ICD-10-CM

## 2013-03-26 MED ORDER — ATORVASTATIN CALCIUM 40 MG PO TABS: 40.0000 mg | ORAL_TABLET | Freq: Every day | ORAL | Status: DC

## 2013-03-28 ENCOUNTER — Ambulatory Visit (INDEPENDENT_AMBULATORY_CARE_PROVIDER_SITE_OTHER): Payer: Medicare Other

## 2013-03-28 DIAGNOSIS — Z23 Encounter for immunization: Secondary | ICD-10-CM

## 2013-06-06 ENCOUNTER — Encounter: Payer: Self-pay | Admitting: Internal Medicine

## 2013-06-06 ENCOUNTER — Telehealth: Payer: Self-pay | Admitting: *Deleted

## 2013-06-06 NOTE — Telephone Encounter (Signed)
Pt phoned stating she is "blocked out" of her mychart account & that it further tells her that her "account is disabled".  Advised her to contact the customer service help desk for online access & she states she was instructed to contact her PCP.  Please advise.    CB# [phone]    Last OV with PCP 03/09/13

## 2013-06-18 ENCOUNTER — Other Ambulatory Visit (INDEPENDENT_AMBULATORY_CARE_PROVIDER_SITE_OTHER): Payer: Medicare Other

## 2013-06-18 DIAGNOSIS — E785 Hyperlipidemia, unspecified: Secondary | ICD-10-CM | POA: Diagnosis not present

## 2013-06-18 LAB — LIPID PANEL
Cholesterol: 149 mg/dL (ref 0–200)
HDL: 62.6 mg/dL (ref >39.00)
LDL Cholesterol: 71 mg/dL (ref 0–99)
Total CHOL/HDL Ratio: 2
Triglycerides: 79 mg/dL (ref 0.0–149.0)
VLDL: 15.8 mg/dL (ref 0.0–40.0)

## 2013-06-18 LAB — HEPATIC FUNCTION PANEL
ALT: 13 U/L (ref 0–35)
AST: 17 U/L (ref 0–37)
Albumin: 3.8 g/dL (ref 3.5–5.2)
Alkaline Phosphatase: 72 U/L (ref 39–117)
Bilirubin, Direct: 0.1 mg/dL (ref 0.0–0.3)
Total Bilirubin: 0.5 mg/dL (ref 0.3–1.2)
Total Protein: 6.6 g/dL (ref 6.0–8.3)

## 2013-06-27 ENCOUNTER — Encounter: Payer: Self-pay | Admitting: Adult Health

## 2013-06-27 ENCOUNTER — Ambulatory Visit (INDEPENDENT_AMBULATORY_CARE_PROVIDER_SITE_OTHER): Payer: Medicare Other | Admitting: Adult Health

## 2013-06-27 VITALS — BP 162/78 | Ht 64.0 in | Wt 150.0 lb

## 2013-06-27 DIAGNOSIS — Z853 Personal history of malignant neoplasm of breast: Secondary | ICD-10-CM | POA: Diagnosis not present

## 2013-06-27 NOTE — Patient Instructions (Signed)
Follow up in 4 months for physical Will request records

## 2013-06-27 NOTE — Progress Notes (Signed)
Subjective:     Patient ID: Jessica Woodward, female   DOB: [birth year], 73 y.o.   MRN: [id number]  HPI Jessica Woodward is a 73 year old white female, married, new to this practice to discuss gyn care options, she has a personnel history of breast cancer, and she is sp hysterectomy(had polyps).  Review of Systems See HPI Reviewed past medical,surgical, social and family history. Reviewed medications and allergies. Past Medical History  Diagnosis Date  . DUCTAL CARCINOMA IN SITU, LEFT BREAST 2002    s/p left lumpectomy, xrt and tamoxifen  . CARCINOMA, SQUAMOUS CELL, HX OF 06/05/2007  . RADIATION THERAPY, HX OF   . Seasonal allergies   . History of chemotherapy   . Hypertension   . Skin cancer    Past Surgical History  Procedure Laterality Date  . Dilation and curettage of uterus    . Left knee surgery    . Abdominal hysterectomy    . Breast surgery      Left breast lumpectomy with axillary sential node  . Right breast lupectomy    . Tubal ligation    . Mohs surgery      (R) arm  . Tonsillectomy and adenoidectomy    . Athroscopy right knee  2013    torn miniscus  . Breast lumpectomy Left 2002    had chemo and radiation  Current outpatient prescriptions:aspirin 81 MG tablet, Take 81 mg by mouth daily.  , Disp: , Rfl: ;  atorvastatin (LIPITOR) 40 MG tablet, Take 1 tablet (40 mg total) by mouth daily., Disp: 30 tablet, Rfl: 5;  Biotin 5000 MCG CAPS, Take 1 capsule by mouth daily., Disp: , Rfl: ;  diltiazem (CARDIZEM CD) 180 MG 24 hr capsule, Take 1 capsule (180 mg total) by mouth daily., Disp: 90 capsule, Rfl: 3 Glucosamine-Chondroit-Vit C-Mn (GLUCOSAMINE-CHONDROITIN) TABS, Take by mouth daily.  , Disp: , Rfl: ;  loratadine (CLARITIN) 10 MG tablet, Take 10 mg by mouth daily as needed.  , Disp: , Rfl: ;  OVER THE COUNTER MEDICATION, Take by mouth daily. Fiber supplement, Disp: , Rfl:     Objective:   Physical Exam BP 162/78  Ht 5\' 4"  (1.626 m)  Wt 150 lb (68.04 kg)  BMI 25.73 kg/m2  Discussed getting physical every 2 years for now, and will request records of hysterectomy, she had breast cancer in 2002 and got chemo and radiation.Had colonoscopy in 2013 and mammogram in 2014.She sees Dr Linda Hedges in [place] for over 20 years as PCP.  Assessment:     Personnel history of breast cancer    Plan:     Return in 4 months for physical Request hystertectomy records Call prn problems

## 2013-07-28 ENCOUNTER — Encounter: Payer: Self-pay | Admitting: Internal Medicine

## 2013-07-30 ENCOUNTER — Encounter: Payer: Self-pay | Admitting: Internal Medicine

## 2013-08-28 ENCOUNTER — Encounter: Payer: Self-pay | Admitting: Internal Medicine

## 2013-08-29 ENCOUNTER — Telehealth: Payer: Self-pay | Admitting: Internal Medicine

## 2013-08-29 NOTE — Telephone Encounter (Signed)
Dr. Linda Hedges put Ms. Jessica Woodward on Lipitor.  She was told to come in April for lipid labs.  She is on the list to est. With Dr. Doug Sou in the fall.  Can the labs be entered for her to come to the lab?

## 2013-08-29 NOTE — Telephone Encounter (Signed)
Due to medicare restrictions we can't do labs without a visit so get her worked in with me sooner

## 2013-08-30 NOTE — Telephone Encounter (Signed)
Pt is aware and scheduled for April 21.

## 2013-09-11 ENCOUNTER — Ambulatory Visit (INDEPENDENT_AMBULATORY_CARE_PROVIDER_SITE_OTHER): Payer: Medicare Other | Admitting: Internal Medicine

## 2013-09-11 ENCOUNTER — Encounter: Payer: Self-pay | Admitting: Internal Medicine

## 2013-09-11 ENCOUNTER — Ambulatory Visit (INDEPENDENT_AMBULATORY_CARE_PROVIDER_SITE_OTHER): Payer: Medicare Other

## 2013-09-11 VITALS — BP 168/100 | HR 76 | Temp 97.1°F | Resp 16 | Ht 64.0 in | Wt 151.8 lb

## 2013-09-11 DIAGNOSIS — E785 Hyperlipidemia, unspecified: Secondary | ICD-10-CM | POA: Diagnosis not present

## 2013-09-11 DIAGNOSIS — I10 Essential (primary) hypertension: Secondary | ICD-10-CM | POA: Diagnosis not present

## 2013-09-11 LAB — LIPID PANEL
Cholesterol: 165 mg/dL (ref 0–200)
HDL: 76.5 mg/dL (ref >39.00)
LDL Cholesterol: 76 mg/dL (ref 0–99)
Total CHOL/HDL Ratio: 2
Triglycerides: 62 mg/dL (ref 0.0–149.0)
VLDL: 12.4 mg/dL (ref 0.0–40.0)

## 2013-09-11 LAB — CBC WITH DIFFERENTIAL/PLATELET
Basophils Absolute: 0 10*3/uL (ref 0.0–0.1)
Basophils Relative: 0.6 % (ref 0.0–3.0)
Eosinophils Absolute: 0.1 10*3/uL (ref 0.0–0.7)
Eosinophils Relative: 2.1 % (ref 0.0–5.0)
HCT: 41.8 % (ref 36.0–46.0)
Hemoglobin: 14 g/dL (ref 12.0–15.0)
Lymphocytes Relative: 28.3 % (ref 12.0–46.0)
Lymphs Abs: 1.8 10*3/uL (ref 0.7–4.0)
MCHC: 33.5 g/dL (ref 30.0–36.0)
MCV: 91.5 fl (ref 78.0–100.0)
Monocytes Absolute: 0.6 10*3/uL (ref 0.1–1.0)
Monocytes Relative: 9.8 % (ref 3.0–12.0)
Neutro Abs: 3.8 10*3/uL (ref 1.4–7.7)
Neutrophils Relative %: 59.2 % (ref 43.0–77.0)
Platelets: 269 10*3/uL (ref 150.0–400.0)
RBC: 4.56 Mil/uL (ref 3.87–5.11)
RDW: 13.4 % (ref 11.5–14.6)
WBC: 6.4 10*3/uL (ref 4.5–10.5)

## 2013-09-11 LAB — BASIC METABOLIC PANEL
BUN: 21 mg/dL (ref 6–23)
CO2: 27 mEq/L (ref 19–32)
Calcium: 9.8 mg/dL (ref 8.4–10.5)
Chloride: 108 mEq/L (ref 96–112)
Creatinine, Ser: 0.7 mg/dL (ref 0.4–1.2)
GFR: 87.37 mL/min (ref >60.00)
Glucose, Bld: 87 mg/dL (ref 70–99)
Potassium: 4.4 mEq/L (ref 3.5–5.1)
Sodium: 143 mEq/L (ref 135–145)

## 2013-09-11 LAB — TSH: TSH: 2.47 u[IU]/mL (ref 0.35–5.50)

## 2013-09-11 MED ORDER — IRBESARTAN-HYDROCHLOROTHIAZIDE 150-12.5 MG PO TABS: 1.0000 | ORAL_TABLET | Freq: Every day | ORAL | Status: DC

## 2013-09-11 NOTE — Assessment & Plan Note (Signed)
Her BP is not well controlled on the CCB so will stop that I think and ARB/HCTZ is a better option for her BP control

## 2013-09-11 NOTE — Assessment & Plan Note (Signed)
She is doing well on lipitor 

## 2013-09-11 NOTE — Progress Notes (Signed)
   Subjective:    Patient ID: Jessica Woodward, female    DOB: 1940/08/04, 73 y.o.   MRN: [id number]  Hypertension This is a chronic problem. The current episode started more than 1 year ago. The problem has been gradually worsening since onset. The problem is uncontrolled. Pertinent negatives include no anxiety, blurred vision, chest pain, headaches, malaise/fatigue, neck pain, orthopnea, palpitations, peripheral edema, PND, shortness of breath or sweats. Agents associated with hypertension include NSAIDs. Past treatments include calcium channel blockers. There are no compliance problems.       Review of Systems  Constitutional: Negative.  Negative for fever, chills, malaise/fatigue, diaphoresis, appetite change and fatigue.  HENT: Negative.   Eyes: Negative.  Negative for blurred vision.  Respiratory: Negative.  Negative for cough, choking, chest tightness, shortness of breath, wheezing and stridor.   Cardiovascular: Negative.  Negative for chest pain, palpitations, orthopnea, leg swelling and PND.  Gastrointestinal: Negative.  Negative for nausea, vomiting, abdominal pain, diarrhea, constipation and blood in stool.  Endocrine: Negative.   Genitourinary: Negative.   Musculoskeletal: Negative.  Negative for arthralgias, back pain, joint swelling, myalgias and neck pain.  Skin: Negative.   Allergic/Immunologic: Negative.   Neurological: Negative.  Negative for dizziness, tremors, weakness, light-headedness and headaches.  Hematological: Negative.  Negative for adenopathy. Does not bruise/bleed easily.  Psychiatric/Behavioral: Negative.        Objective:   Physical Exam  Vitals reviewed. Constitutional: She is oriented to person, place, and time. She appears well-developed and well-nourished. No distress.  HENT:  Head: Normocephalic and atraumatic.  Mouth/Throat: Oropharynx is clear and moist. No oropharyngeal exudate.  Eyes: Conjunctivae are normal. Right eye exhibits no discharge.  Left eye exhibits no discharge. No scleral icterus.  Neck: Normal range of motion. Neck supple. No JVD present. No tracheal deviation present. No thyromegaly present.  Cardiovascular: Normal rate, regular rhythm, normal heart sounds and intact distal pulses.  Exam reveals no gallop and no friction rub.   No murmur heard. Pulmonary/Chest: Effort normal and breath sounds normal. No stridor. No respiratory distress. She has no wheezes. She has no rales. She exhibits no tenderness.  Abdominal: Soft. Bowel sounds are normal. She exhibits no distension and no mass. There is no tenderness. There is no rebound and no guarding.  Musculoskeletal: Normal range of motion. She exhibits no edema and no tenderness.  Lymphadenopathy:    She has no cervical adenopathy.  Neurological: She is oriented to person, place, and time.  Skin: Skin is warm and dry. No rash noted. She is not diaphoretic. No erythema. No pallor.  Psychiatric: She has a normal mood and affect. Her behavior is normal. Judgment and thought content normal.    Lab Results  Component Value Date   WBC 5.6 04/02/2011   HGB 13.1 04/02/2011   HCT 38.5 04/02/2011   PLT 268 04/02/2011   GLUCOSE 90 02/27/2013   CHOL 149 06/18/2013   TRIG 79.0 06/18/2013   HDL 62.60 06/18/2013   LDLDIRECT 166.9 03/23/2013   LDLCALC 71 06/18/2013   ALT 13 06/18/2013   AST 17 06/18/2013   NA 142 02/27/2013   K 4.5 02/27/2013   CL 108 02/27/2013   CREATININE 0.9 02/27/2013   BUN 21 02/27/2013   CO2 29 02/27/2013   TSH 1.93 12/28/2010   HGBA1C 5.4 01/17/2012        Assessment & Plan:

## 2013-09-11 NOTE — Patient Instructions (Signed)

## 2013-09-11 NOTE — Progress Notes (Signed)
Pre visit review using our clinic review tool, if applicable. No additional management support is needed unless otherwise documented below in the visit note. 

## 2013-09-12 ENCOUNTER — Other Ambulatory Visit: Payer: Medicare Other

## 2013-09-12 ENCOUNTER — Encounter: Payer: Self-pay | Admitting: Internal Medicine

## 2013-09-12 LAB — URINALYSIS, ROUTINE W REFLEX MICROSCOPIC
Bilirubin Urine: NEGATIVE
Hgb urine dipstick: NEGATIVE
Ketones, ur: NEGATIVE
Leukocytes, UA: NEGATIVE
Nitrite: NEGATIVE
Specific Gravity, Urine: 1.015 (ref 1.000–1.030)
Total Protein, Urine: NEGATIVE
Urine Glucose: NEGATIVE
Urobilinogen, UA: 0.2 (ref 0.0–1.0)
pH: 6 (ref 5.0–8.0)

## 2013-09-17 ENCOUNTER — Other Ambulatory Visit: Payer: Self-pay | Admitting: *Deleted

## 2013-09-17 MED ORDER — ATORVASTATIN CALCIUM 40 MG PO TABS: 40.0000 mg | ORAL_TABLET | Freq: Every day | ORAL | Status: DC

## 2013-10-16 ENCOUNTER — Ambulatory Visit (INDEPENDENT_AMBULATORY_CARE_PROVIDER_SITE_OTHER): Payer: Medicare Other | Admitting: Adult Health

## 2013-10-16 ENCOUNTER — Encounter: Payer: Self-pay | Admitting: Adult Health

## 2013-10-16 VITALS — BP 110/70 | HR 74 | Ht 64.0 in | Wt 153.0 lb

## 2013-10-16 DIAGNOSIS — Z1212 Encounter for screening for malignant neoplasm of rectum: Secondary | ICD-10-CM

## 2013-10-16 DIAGNOSIS — Z853 Personal history of malignant neoplasm of breast: Secondary | ICD-10-CM | POA: Diagnosis not present

## 2013-10-16 DIAGNOSIS — H2510 Age-related nuclear cataract, unspecified eye: Secondary | ICD-10-CM | POA: Diagnosis not present

## 2013-10-16 DIAGNOSIS — Z01419 Encounter for gynecological examination (general) (routine) without abnormal findings: Secondary | ICD-10-CM

## 2013-10-16 LAB — HEMOCCULT GUIAC POC 1CARD (OFFICE): Fecal Occult Blood, POC: NEGATIVE

## 2013-10-16 NOTE — Patient Instructions (Signed)
Physical in 2 years Mammogram yearly  Colonoscopy 2 years ago  Labs with PCP

## 2013-10-16 NOTE — Progress Notes (Signed)
Patient ID: Jessica Woodward, female   DOB: 11-Jan-1941, 73 y.o.   MRN: [id number] History of Present Illness: Jessica Woodward is a 73 year old white female, married in for a physical. No complaints.Did have elevated BP when had labs and had BP meds changed and BP has been good.  Current Medications, Allergies, Past Medical History, Past Surgical History, Family History and Social History were reviewed in [employer] record.     Review of Systems: Patient denies any headaches, blurred vision, shortness of breath, chest pain, abdominal pain, problems with bowel movements, urination, or intercourse. No joint swelling or mood swings.    Physical Exam:BP 110/70  Pulse 74  Ht 5\' 4"  (1.626 m)  Wt 153 lb (69.4 kg)  BMI 26.25 kg/m2 General:  Well developed, well nourished, no acute distress Skin:  Warm and dry Neck:  Midline trachea, normal thyroid, no carotid bruits heard. Lungs; Clear to auscultation bilaterally Breast:  No dominant palpable mass, retraction, or nipple discharge, has scar left breast from lumpectomy. Cardiovascular: Regular rate and rhythm Abdomen:  Soft, non tender, no hepatosplenomegaly Pelvic:  External genitalia is normal in appearance for age.  The vagina is atrophic. The cervix and uterus are absent.No adnexal masses or tenderness noted. Rectal: Good sphincter tone, no polyps, or hemorrhoids felt.  Hemoccult negative. Extremities:  No swelling or varicosities noted Psych:  No mood changes, alert and cooperative, seems happy   Impression: Yearly gyn exam no pap History of breast cancer   Plan: Physical in 2 year Mammogram yearly Labs with PCP Colonoscopy per GI

## 2013-10-23 ENCOUNTER — Ambulatory Visit (INDEPENDENT_AMBULATORY_CARE_PROVIDER_SITE_OTHER): Payer: Medicare Other | Admitting: Internal Medicine

## 2013-10-23 ENCOUNTER — Encounter: Payer: Self-pay | Admitting: Internal Medicine

## 2013-10-23 VITALS — BP 130/80 | HR 79 | Temp 98.3°F | Resp 16 | Ht 64.0 in | Wt 151.5 lb

## 2013-10-23 DIAGNOSIS — E785 Hyperlipidemia, unspecified: Secondary | ICD-10-CM | POA: Diagnosis not present

## 2013-10-23 DIAGNOSIS — I10 Essential (primary) hypertension: Secondary | ICD-10-CM | POA: Diagnosis not present

## 2013-10-23 MED ORDER — IRBESARTAN-HYDROCHLOROTHIAZIDE 150-12.5 MG PO TABS: 1.0000 | ORAL_TABLET | Freq: Every day | ORAL | Status: DC

## 2013-10-23 MED ORDER — ATORVASTATIN CALCIUM 40 MG PO TABS: 40.0000 mg | ORAL_TABLET | Freq: Every day | ORAL | Status: DC

## 2013-10-23 NOTE — Patient Instructions (Signed)

## 2013-10-23 NOTE — Progress Notes (Signed)
   Subjective:    Patient ID: Jessica Woodward, female    DOB: Feb 01, 1941, 73 y.o.   MRN: [id number]  Hypertension This is a chronic problem. The current episode started more than 1 year ago. The problem has been gradually improving since onset. The problem is controlled. Pertinent negatives include no anxiety, blurred vision, chest pain, headaches, malaise/fatigue, neck pain, orthopnea, palpitations, peripheral edema, PND, shortness of breath or sweats. Agents associated with hypertension include NSAIDs. Past treatments include angiotensin blockers and diuretics. The current treatment provides significant improvement. There are no compliance problems.       Review of Systems  Constitutional: Negative.  Negative for fever, chills, malaise/fatigue, diaphoresis, appetite change and fatigue.  HENT: Negative.   Eyes: Negative.  Negative for blurred vision.  Respiratory: Negative.  Negative for apnea, cough, choking, chest tightness, shortness of breath, wheezing and stridor.   Cardiovascular: Negative.  Negative for chest pain, palpitations, orthopnea, leg swelling and PND.  Gastrointestinal: Negative.  Negative for nausea, vomiting, abdominal pain, diarrhea, constipation and blood in stool.  Endocrine: Negative.   Genitourinary: Negative.   Musculoskeletal: Negative.  Negative for arthralgias, back pain, myalgias, neck pain and neck stiffness.  Skin: Negative.   Allergic/Immunologic: Negative.   Neurological: Negative for dizziness, tremors, weakness, light-headedness, numbness and headaches.  Hematological: Negative.  Negative for adenopathy. Does not bruise/bleed easily.  Psychiatric/Behavioral: Negative.        Objective:   Physical Exam  Vitals reviewed. Constitutional: She is oriented to person, place, and time. She appears well-developed and well-nourished. No distress.  HENT:  Head: Normocephalic and atraumatic.  Mouth/Throat: Oropharynx is clear and moist. No oropharyngeal  exudate.  Eyes: Conjunctivae are normal. Right eye exhibits no discharge. Left eye exhibits no discharge. No scleral icterus.  Neck: Normal range of motion. Neck supple. No JVD present. No tracheal deviation present. No thyromegaly present.  Cardiovascular: Normal rate, regular rhythm, normal heart sounds and intact distal pulses.  Exam reveals no gallop and no friction rub.   No murmur heard. Pulmonary/Chest: Effort normal and breath sounds normal. No stridor. No respiratory distress. She has no wheezes. She has no rales. She exhibits no tenderness.  Abdominal: Soft. Bowel sounds are normal. She exhibits no distension and no mass. There is no tenderness. There is no rebound and no guarding.  Musculoskeletal: Normal range of motion. She exhibits no edema and no tenderness.  Lymphadenopathy:    She has no cervical adenopathy.  Neurological: She is oriented to person, place, and time.  Skin: Skin is warm and dry. No rash noted. She is not diaphoretic. No erythema. No pallor.     Lab Results  Component Value Date   WBC 6.4 09/11/2013   HGB 14.0 09/11/2013   HCT 41.8 09/11/2013   PLT 269.0 09/11/2013   GLUCOSE 87 09/11/2013   CHOL 165 09/11/2013   TRIG 62.0 09/11/2013   HDL 76.50 09/11/2013   LDLDIRECT 166.9 03/23/2013   LDLCALC 76 09/11/2013   ALT 13 06/18/2013   AST 17 06/18/2013   NA 143 09/11/2013   K 4.4 09/11/2013   CL 108 09/11/2013   CREATININE 0.7 09/11/2013   BUN 21 09/11/2013   CO2 27 09/11/2013   TSH 2.47 09/11/2013   HGBA1C 5.4 01/17/2012       Assessment & Plan:

## 2013-10-23 NOTE — Assessment & Plan Note (Signed)
She is doing well in lipitor

## 2013-10-23 NOTE — Assessment & Plan Note (Signed)
Her BP is well controlled 

## 2013-10-23 NOTE — Progress Notes (Signed)
Pre visit review using our clinic review tool, if applicable. No additional management support is needed unless otherwise documented below in the visit note. 

## 2013-10-24 ENCOUNTER — Telehealth: Payer: Self-pay | Admitting: Internal Medicine

## 2013-10-24 NOTE — Telephone Encounter (Signed)
Relevant patient education assigned to patient using Emmi. ° °

## 2013-12-27 DIAGNOSIS — M1710 Unilateral primary osteoarthritis, unspecified knee: Secondary | ICD-10-CM | POA: Diagnosis not present

## 2014-02-06 DIAGNOSIS — D692 Other nonthrombocytopenic purpura: Secondary | ICD-10-CM | POA: Diagnosis not present

## 2014-02-06 DIAGNOSIS — Z85828 Personal history of other malignant neoplasm of skin: Secondary | ICD-10-CM | POA: Diagnosis not present

## 2014-02-28 ENCOUNTER — Other Ambulatory Visit: Payer: Self-pay

## 2014-02-28 DIAGNOSIS — Z1231 Encounter for screening mammogram for malignant neoplasm of breast: Secondary | ICD-10-CM

## 2014-02-28 DIAGNOSIS — Z1239 Encounter for other screening for malignant neoplasm of breast: Secondary | ICD-10-CM

## 2014-03-08 ENCOUNTER — Other Ambulatory Visit: Payer: Self-pay

## 2014-03-13 DIAGNOSIS — D225 Melanocytic nevi of trunk: Secondary | ICD-10-CM | POA: Diagnosis not present

## 2014-03-13 DIAGNOSIS — D1801 Hemangioma of skin and subcutaneous tissue: Secondary | ICD-10-CM | POA: Diagnosis not present

## 2014-03-13 DIAGNOSIS — Z85828 Personal history of other malignant neoplasm of skin: Secondary | ICD-10-CM | POA: Diagnosis not present

## 2014-03-13 DIAGNOSIS — L84 Corns and callosities: Secondary | ICD-10-CM | POA: Diagnosis not present

## 2014-03-13 DIAGNOSIS — L814 Other melanin hyperpigmentation: Secondary | ICD-10-CM | POA: Diagnosis not present

## 2014-03-13 DIAGNOSIS — L821 Other seborrheic keratosis: Secondary | ICD-10-CM | POA: Diagnosis not present

## 2014-03-20 ENCOUNTER — Other Ambulatory Visit (INDEPENDENT_AMBULATORY_CARE_PROVIDER_SITE_OTHER): Payer: Medicare Other

## 2014-03-20 ENCOUNTER — Ambulatory Visit (INDEPENDENT_AMBULATORY_CARE_PROVIDER_SITE_OTHER): Payer: Medicare Other | Admitting: Internal Medicine

## 2014-03-20 ENCOUNTER — Encounter: Payer: Self-pay | Admitting: Internal Medicine

## 2014-03-20 VITALS — BP 128/82 | HR 68 | Temp 97.8°F | Resp 16 | Ht 64.0 in | Wt 151.0 lb

## 2014-03-20 DIAGNOSIS — Z23 Encounter for immunization: Secondary | ICD-10-CM | POA: Diagnosis not present

## 2014-03-20 DIAGNOSIS — E785 Hyperlipidemia, unspecified: Secondary | ICD-10-CM

## 2014-03-20 DIAGNOSIS — I10 Essential (primary) hypertension: Secondary | ICD-10-CM

## 2014-03-20 DIAGNOSIS — D0592 Unspecified type of carcinoma in situ of left breast: Secondary | ICD-10-CM

## 2014-03-20 DIAGNOSIS — Z0000 Encounter for general adult medical examination without abnormal findings: Secondary | ICD-10-CM

## 2014-03-20 LAB — TSH: TSH: 3 u[IU]/mL (ref 0.35–4.50)

## 2014-03-20 LAB — COMPREHENSIVE METABOLIC PANEL
ALT: 15 U/L (ref 0–35)
AST: 18 U/L (ref 0–37)
Albumin: 3.7 g/dL (ref 3.5–5.2)
Alkaline Phosphatase: 69 U/L (ref 39–117)
BUN: 22 mg/dL (ref 6–23)
CO2: 26 mEq/L (ref 19–32)
Calcium: 10.1 mg/dL (ref 8.4–10.5)
Chloride: 107 mEq/L (ref 96–112)
Creatinine, Ser: 0.8 mg/dL (ref 0.4–1.2)
GFR: 73.72 mL/min (ref >60.00)
Glucose, Bld: 90 mg/dL (ref 70–99)
Potassium: 4.2 mEq/L (ref 3.5–5.1)
Sodium: 139 mEq/L (ref 135–145)
Total Bilirubin: 0.7 mg/dL (ref 0.2–1.2)
Total Protein: 7.1 g/dL (ref 6.0–8.3)

## 2014-03-20 LAB — CBC WITH DIFFERENTIAL/PLATELET
Basophils Absolute: 0 10*3/uL (ref 0.0–0.1)
Basophils Relative: 0.5 % (ref 0.0–3.0)
Eosinophils Absolute: 0.2 10*3/uL (ref 0.0–0.7)
Eosinophils Relative: 1.9 % (ref 0.0–5.0)
HCT: 39.9 % (ref 36.0–46.0)
Hemoglobin: 13.5 g/dL (ref 12.0–15.0)
Lymphocytes Relative: 28.6 % (ref 12.0–46.0)
Lymphs Abs: 2.3 10*3/uL (ref 0.7–4.0)
MCHC: 33.8 g/dL (ref 30.0–36.0)
MCV: 91.2 fl (ref 78.0–100.0)
Monocytes Absolute: 0.6 10*3/uL (ref 0.1–1.0)
Monocytes Relative: 7.6 % (ref 3.0–12.0)
Neutro Abs: 5 10*3/uL (ref 1.4–7.7)
Neutrophils Relative %: 61.4 % (ref 43.0–77.0)
Platelets: 298 10*3/uL (ref 150.0–400.0)
RBC: 4.37 Mil/uL (ref 3.87–5.11)
RDW: 13.5 % (ref 11.5–15.5)
WBC: 8.1 10*3/uL (ref 4.0–10.5)

## 2014-03-20 LAB — LIPID PANEL
Cholesterol: 186 mg/dL (ref 0–200)
HDL: 71.1 mg/dL (ref >39.00)
LDL Cholesterol: 92 mg/dL (ref 0–99)
NonHDL: 114.9
Total CHOL/HDL Ratio: 3
Triglycerides: 114 mg/dL (ref 0.0–149.0)
VLDL: 22.8 mg/dL (ref 0.0–40.0)

## 2014-03-20 MED ORDER — ATORVASTATIN CALCIUM 40 MG PO TABS: 40.0000 mg | ORAL_TABLET | Freq: Every day | ORAL | Status: DC

## 2014-03-20 MED ORDER — IRBESARTAN-HYDROCHLOROTHIAZIDE 150-12.5 MG PO TABS: 1.0000 | ORAL_TABLET | Freq: Every day | ORAL | Status: DC

## 2014-03-20 NOTE — Assessment & Plan Note (Addendum)

## 2014-03-20 NOTE — Assessment & Plan Note (Signed)
She is doing well on lipitor Will recheck her FLP CMP and TSH

## 2014-03-20 NOTE — Progress Notes (Signed)
Pre visit review using our clinic review tool, if applicable. No additional management support is needed unless otherwise documented below in the visit note. 

## 2014-03-20 NOTE — Progress Notes (Signed)
Subjective:    Patient ID: Jessica Woodward, female    DOB: 01/19/1941, 73 y.o.   MRN: [id number]  Hypertension This is a chronic problem. The current episode started yesterday. The problem has been gradually improving since onset. The problem is controlled. Pertinent negatives include no anxiety, blurred vision, chest pain, headaches, malaise/fatigue, neck pain, orthopnea, palpitations, peripheral edema, PND, shortness of breath or sweats. Agents associated with hypertension include NSAIDs. Past treatments include angiotensin blockers and diuretics. The current treatment provides significant improvement. There are no compliance problems.       Review of Systems  Constitutional: Negative.  Negative for fever, chills, malaise/fatigue, diaphoresis, appetite change and fatigue.  HENT: Negative.   Eyes: Negative.  Negative for blurred vision.  Respiratory: Negative.  Negative for cough, choking, chest tightness, shortness of breath and stridor.   Cardiovascular: Negative.  Negative for chest pain, palpitations, orthopnea, leg swelling and PND.  Gastrointestinal: Negative.  Negative for nausea, vomiting, abdominal pain, diarrhea, constipation and blood in stool.  Endocrine: Negative.   Genitourinary: Negative.  Negative for dysuria, urgency, frequency, decreased urine volume and difficulty urinating.  Musculoskeletal: Negative.  Negative for arthralgias, back pain, gait problem, joint swelling, myalgias, neck pain and neck stiffness.  Skin: Negative.  Negative for rash.  Allergic/Immunologic: Negative.   Neurological: Negative.  Negative for dizziness, tremors, syncope, light-headedness, numbness and headaches.  Hematological: Negative.  Negative for adenopathy. Does not bruise/bleed easily.  Psychiatric/Behavioral: Negative.        Objective:   Physical Exam  Vitals reviewed. Constitutional: She is oriented to person, place, and time. She appears well-developed and well-nourished. No  distress.  HENT:  Head: Normocephalic and atraumatic.  Mouth/Throat: Oropharynx is clear and moist. No oropharyngeal exudate.  Eyes: Conjunctivae are normal. Right eye exhibits no discharge. Left eye exhibits no discharge. No scleral icterus.  Neck: Normal range of motion. Neck supple. No JVD present. No tracheal deviation present. No thyromegaly present.  Cardiovascular: Normal rate, regular rhythm, normal heart sounds and intact distal pulses.  Exam reveals no gallop and no friction rub.   No murmur heard. Pulmonary/Chest: Effort normal and breath sounds normal. No accessory muscle usage or stridor. Not tachypneic. No respiratory distress. She has no wheezes. She has no rales. Chest wall is not dull to percussion. She exhibits no mass, no tenderness, no bony tenderness, no laceration, no crepitus, no edema, no deformity, no swelling and no retraction. Right breast exhibits no inverted nipple, no mass, no nipple discharge, no skin change and no tenderness. Left breast exhibits no inverted nipple, no mass, no nipple discharge, no skin change and no tenderness. Breasts are symmetrical.  Abdominal: Soft. Bowel sounds are normal. She exhibits no distension and no mass. There is no tenderness. There is no rebound and no guarding. Hernia confirmed negative in the right inguinal area and confirmed negative in the left inguinal area.  Genitourinary: Rectum normal and vagina normal. Rectal exam shows no external hemorrhoid, no internal hemorrhoid, no fissure, no mass, no tenderness and anal tone normal. Guaiac negative stool. No breast swelling, tenderness, discharge or bleeding. Pelvic exam was performed with patient supine. No labial fusion. There is no rash, tenderness, lesion or injury on the right labia. There is no tenderness, lesion or injury on the left labia. Cervix exhibits no discharge. Right adnexum displays no mass, no tenderness and no fullness. Left adnexum displays no mass, no tenderness and no  fullness. No erythema, tenderness or bleeding around the vagina. No foreign body  around the vagina. No signs of injury around the vagina. No vaginal discharge found.  Musculoskeletal: Normal range of motion. She exhibits no edema and no tenderness.  Lymphadenopathy:    She has no cervical adenopathy.       Right: No inguinal adenopathy present.       Left: No inguinal adenopathy present.  Neurological: She is oriented to person, place, and time.  Skin: Skin is warm and dry. No rash noted. She is not diaphoretic. No erythema. No pallor.  Psychiatric: She has a normal mood and affect. Her behavior is normal. Judgment and thought content normal.     Lab Results  Component Value Date   WBC 6.4 09/11/2013   HGB 14.0 09/11/2013   HCT 41.8 09/11/2013   PLT 269.0 09/11/2013   GLUCOSE 87 09/11/2013   CHOL 165 09/11/2013   TRIG 62.0 09/11/2013   HDL 76.50 09/11/2013   LDLDIRECT 166.9 03/23/2013   LDLCALC 76 09/11/2013   ALT 13 06/18/2013   AST 17 06/18/2013   NA 143 09/11/2013   K 4.4 09/11/2013   CL 108 09/11/2013   CREATININE 0.7 09/11/2013   BUN 21 09/11/2013   CO2 27 09/11/2013   TSH 2.47 09/11/2013   HGBA1C 5.4 01/17/2012       Assessment & Plan:

## 2014-03-20 NOTE — Assessment & Plan Note (Signed)
Her BP is well controlled I will monitor her lytes and renal function 

## 2014-03-20 NOTE — Patient Instructions (Signed)
Hypertension Hypertension, commonly called high blood pressure, is when the force of blood pumping through your arteries is too strong. Your arteries are the blood vessels that carry blood from your heart throughout your body. A blood pressure reading consists of a higher number over a lower number, such as 110/72. The higher number (systolic) is the pressure inside your arteries when your heart pumps. The lower number (diastolic) is the pressure inside your arteries when your heart relaxes. Ideally you want your blood pressure below 120/80. Hypertension forces your heart to work harder to pump blood. Your arteries may become narrow or stiff. Having hypertension puts you at risk for heart disease, stroke, and other problems.  RISK FACTORS Some risk factors for high blood pressure are controllable. Others are not.  Risk factors you cannot control include:   Race. You may be at higher risk if you are African American.  Age. Risk increases with age.  Gender. Men are at higher risk than women before age 45 years. After age 65, women are at higher risk than men. Risk factors you can control include:  Not getting enough exercise or physical activity.  Being overweight.  Getting too much fat, sugar, calories, or salt in your diet.  Drinking too much alcohol. SIGNS AND SYMPTOMS Hypertension does not usually cause signs or symptoms. Extremely high blood pressure (hypertensive crisis) may cause headache, anxiety, shortness of breath, and nosebleed. DIAGNOSIS  To check if you have hypertension, your health care provider will measure your blood pressure while you are seated, with your arm held at the level of your heart. It should be measured at least twice using the same arm. Certain conditions can cause a difference in blood pressure between your right and left arms. A blood pressure reading that is higher than normal on one occasion does not mean that you need treatment. If one blood pressure reading  is high, ask your health care provider about having it checked again. TREATMENT  Treating high blood pressure includes making lifestyle changes and possibly taking medicine. Living a healthy lifestyle can help lower high blood pressure. You may need to change some of your habits. Lifestyle changes may include:  Following the DASH diet. This diet is high in fruits, vegetables, and whole grains. It is low in salt, red meat, and added sugars.  Getting at least 2 hours of brisk physical activity every week.  Losing weight if necessary.  Not smoking.  Limiting alcoholic beverages.  Learning ways to reduce stress. If lifestyle changes are not enough to get your blood pressure under control, your health care provider may prescribe medicine. You may need to take more than one. Work closely with your health care provider to understand the risks and benefits. HOME CARE INSTRUCTIONS  Have your blood pressure rechecked as directed by your health care provider.   Take medicines only as directed by your health care provider. Follow the directions carefully. Blood pressure medicines must be taken as prescribed. The medicine does not work as well when you skip doses. Skipping doses also puts you at risk for problems.   Do not smoke.   Monitor your blood pressure at home as directed by your health care provider. SEEK MEDICAL CARE IF:   You think you are having a reaction to medicines taken.  You have recurrent headaches or feel dizzy.  You have swelling in your ankles.  You have trouble with your vision. SEEK IMMEDIATE MEDICAL CARE IF:  You develop a severe headache or confusion.    You have unusual weakness, numbness, or feel faint.  You have severe chest or abdominal pain.  You vomit repeatedly.  You have trouble breathing. MAKE SURE YOU:   Understand these instructions.  Will watch your condition.  Will get help right away if you are not doing well or get worse. Document  Released: 05/10/2005 Document Revised: 09/24/2013 Document Reviewed: 03/02/2013 [facility] Patient Information 2015 [place], [place]. This information is not intended to replace advice given to you by your health care provider. Make sure you discuss any questions you have with your health care provider. Preventive Care for Adults A healthy lifestyle and preventive care can promote health and wellness. Preventive health guidelines for women include the following key practices.  A routine yearly physical is a good way to check with your health care provider about your health and preventive screening. It is a chance to share any concerns and updates on your health and to receive a thorough exam.  Visit your dentist for a routine exam and preventive care every 6 months. Brush your teeth twice a day and floss once a day. Good oral hygiene prevents tooth decay and gum disease.  The frequency of eye exams is based on your age, health, family medical history, use of contact lenses, and other factors. Follow your health care provider's recommendations for frequency of eye exams.  Eat a healthy diet. Foods like vegetables, fruits, whole grains, low-fat dairy products, and lean protein foods contain the nutrients you need without too many calories. Decrease your intake of foods high in solid fats, added sugars, and salt. Eat the right amount of calories for you.Get information about a proper diet from your health care provider, if necessary.  Regular physical exercise is one of the most important things you can do for your health. Most adults should get at least 150 minutes of moderate-intensity exercise (any activity that increases your heart rate and causes you to sweat) each week. In addition, most adults need muscle-strengthening exercises on 2 or more days a week.  Maintain a healthy weight. The body mass index (BMI) is a screening tool to identify possible weight problems. It provides an estimate of body fat  based on height and weight. Your health care provider can find your BMI and can help you achieve or maintain a healthy weight.For adults 20 years and older:  A BMI below 18.5 is considered underweight.  A BMI of 18.5 to 24.9 is normal.  A BMI of 25 to 29.9 is considered overweight.  A BMI of 30 and above is considered obese.  Maintain normal blood lipids and cholesterol levels by exercising and minimizing your intake of saturated fat. Eat a balanced diet with plenty of fruit and vegetables. Blood tests for lipids and cholesterol should begin at age 43 and be repeated every 5 years. If your lipid or cholesterol levels are high, you are over 50, or you are at high risk for heart disease, you may need your cholesterol levels checked more frequently.Ongoing high lipid and cholesterol levels should be treated with medicines if diet and exercise are not working.  If you smoke, find out from your health care provider how to quit. If you do not use tobacco, do not start.  Lung cancer screening is recommended for adults aged 21-80 years who are at high risk for developing lung cancer because of a history of smoking. A yearly low-dose CT scan of the lungs is recommended for people who have at least a 30-pack-year history of smoking  and are a current smoker or have quit within the past 15 years. A pack year of smoking is smoking an average of 1 pack of cigarettes a day for 1 year (for example: 1 pack a day for 30 years or 2 packs a day for 15 years). Yearly screening should continue until the smoker has stopped smoking for at least 15 years. Yearly screening should be stopped for people who develop a health problem that would prevent them from having lung cancer treatment.  If you are pregnant, do not drink alcohol. If you are breastfeeding, be very cautious about drinking alcohol. If you are not pregnant and choose to drink alcohol, do not have more than 1 drink per day. One drink is considered to be 12  ounces (355 mL) of beer, 5 ounces (148 mL) of wine, or 1.5 ounces (44 mL) of liquor.  Avoid use of street drugs. Do not share needles with anyone. Ask for help if you need support or instructions about stopping the use of drugs.  High blood pressure causes heart disease and increases the risk of stroke. Your blood pressure should be checked at least every 1 to 2 years. Ongoing high blood pressure should be treated with medicines if weight loss and exercise do not work.  If you are 54-39 years old, ask your health care provider if you should take aspirin to prevent strokes.  Diabetes screening involves taking a blood sample to check your fasting blood sugar level. This should be done once every 3 years, after age 66, if you are within normal weight and without risk factors for diabetes. Testing should be considered at a younger age or be carried out more frequently if you are overweight and have at least 1 risk factor for diabetes.  Breast cancer screening is essential preventive care for women. You should practice "breast self-awareness." This means understanding the normal appearance and feel of your breasts and may include breast self-examination. Any changes detected, no matter how small, should be reported to a health care provider. Women in their 71s and 30s should have a clinical breast exam (CBE) by a health care provider as part of a regular health exam every 1 to 3 years. After age 42, women should have a CBE every year. Starting at age 47, women should consider having a mammogram (breast X-ray test) every year. Women who have a family history of breast cancer should talk to their health care provider about genetic screening. Women at a high risk of breast cancer should talk to their health care providers about having an MRI and a mammogram every year.  Breast cancer gene (BRCA)-related cancer risk assessment is recommended for women who have family members with BRCA-related cancers.  BRCA-related cancers include breast, ovarian, tubal, and peritoneal cancers. Having family members with these cancers may be associated with an increased risk for harmful changes (mutations) in the breast cancer genes BRCA1 and BRCA2. Results of the assessment will determine the need for genetic counseling and BRCA1 and BRCA2 testing.  Routine pelvic exams to screen for cancer are no longer recommended for nonpregnant women who are considered low risk for cancer of the pelvic organs (ovaries, uterus, and vagina) and who do not have symptoms. Ask your health care provider if a screening pelvic exam is right for you.  If you have had past treatment for cervical cancer or a condition that could lead to cancer, you need Pap tests and screening for cancer for at least 20 years after  your treatment. If Pap tests have been discontinued, your risk factors (such as having a new sexual partner) need to be reassessed to determine if screening should be resumed. Some women have medical problems that increase the chance of getting cervical cancer. In these cases, your health care provider may recommend more frequent screening and Pap tests.  The HPV test is an additional test that may be used for cervical cancer screening. The HPV test looks for the virus that can cause the cell changes on the cervix. The cells collected during the Pap test can be tested for HPV. The HPV test could be used to screen women aged 71 years and older, and should be used in women of any age who have unclear Pap test results. After the age of 78, women should have HPV testing at the same frequency as a Pap test.  Colorectal cancer can be detected and often prevented. Most routine colorectal cancer screening begins at the age of 62 years and continues through age 65 years. However, your health care provider may recommend screening at an earlier age if you have risk factors for colon cancer. On a yearly basis, your health care provider may  provide home test kits to check for hidden blood in the stool. Use of a small camera at the end of a tube, to directly examine the colon (sigmoidoscopy or colonoscopy), can detect the earliest forms of colorectal cancer. Talk to your health care provider about this at age 66, when routine screening begins. Direct exam of the colon should be repeated every 5-10 years through age 5 years, unless early forms of pre-cancerous polyps or small growths are found.  People who are at an increased risk for hepatitis B should be screened for this virus. You are considered at high risk for hepatitis B if:  You were born in a country where hepatitis B occurs often. Talk with your health care provider about which countries are considered high risk.  Your parents were born in a high-risk country and you have not received a shot to protect against hepatitis B (hepatitis B vaccine).  You have HIV or AIDS.  You use needles to inject street drugs.  You live with, or have sex with, someone who has hepatitis B.  You get hemodialysis treatment.  You take certain medicines for conditions like cancer, organ transplantation, and autoimmune conditions.  Hepatitis C blood testing is recommended for all people born from [birth year] through 1964 and any individual with known risks for hepatitis C.  Practice safe sex. Use condoms and avoid high-risk sexual practices to reduce the spread of sexually transmitted infections (STIs). STIs include gonorrhea, chlamydia, syphilis, trichomonas, herpes, HPV, and human immunodeficiency virus (HIV). Herpes, HIV, and HPV are viral illnesses that have no cure. They can result in disability, cancer, and death.  You should be screened for sexually transmitted illnesses (STIs) including gonorrhea and chlamydia if:  You are sexually active and are younger than 24 years.  You are older than 24 years and your health care provider tells you that you are at risk for this type of  infection.  Your sexual activity has changed since you were last screened and you are at an increased risk for chlamydia or gonorrhea. Ask your health care provider if you are at risk.  If you are at risk of being infected with HIV, it is recommended that you take a prescription medicine daily to prevent HIV infection. This is called preexposure prophylaxis (PrEP). You are considered  at risk if:  You are a heterosexual woman, are sexually active, and are at increased risk for HIV infection.  You take drugs by injection.  You are sexually active with a partner who has HIV.  Talk with your health care provider about whether you are at high risk of being infected with HIV. If you choose to begin PrEP, you should first be tested for HIV. You should then be tested every 3 months for as long as you are taking PrEP.  Osteoporosis is a disease in which the bones lose minerals and strength with aging. This can result in serious bone fractures or breaks. The risk of osteoporosis can be identified using a bone density scan. Women ages 16 years and over and women at risk for fractures or osteoporosis should discuss screening with their health care providers. Ask your health care provider whether you should take a calcium supplement or vitamin D to reduce the rate of osteoporosis.  Menopause can be associated with physical symptoms and risks. Hormone replacement therapy is available to decrease symptoms and risks. You should talk to your health care provider about whether hormone replacement therapy is right for you.  Use sunscreen. Apply sunscreen liberally and repeatedly throughout the day. You should seek shade when your shadow is shorter than you. Protect yourself by wearing long sleeves, pants, a wide-brimmed hat, and sunglasses year round, whenever you are outdoors.  Once a month, do a whole body skin exam, using a mirror to look at the skin on your back. Tell your health care provider of new moles,  moles that have irregular borders, moles that are larger than a pencil eraser, or moles that have changed in shape or color.  Stay current with required vaccines (immunizations).  Influenza vaccine. All adults should be immunized every year.  Tetanus, diphtheria, and acellular pertussis (Td, Tdap) vaccine. Pregnant women should receive 1 dose of Tdap vaccine during each pregnancy. The dose should be obtained regardless of the length of time since the last dose. Immunization is preferred during the 27th-36th week of gestation. An adult who has not previously received Tdap or who does not know her vaccine status should receive 1 dose of Tdap. This initial dose should be followed by tetanus and diphtheria toxoids (Td) booster doses every 10 years. Adults with an unknown or incomplete history of completing a 3-dose immunization series with Td-containing vaccines should begin or complete a primary immunization series including a Tdap dose. Adults should receive a Td booster every 10 years.  Varicella vaccine. An adult without evidence of immunity to varicella should receive 2 doses or a second dose if she has previously received 1 dose. Pregnant females who do not have evidence of immunity should receive the first dose after pregnancy. This first dose should be obtained before leaving the health care facility. The second dose should be obtained 4-8 weeks after the first dose.  Human papillomavirus (HPV) vaccine. Females aged 13-26 years who have not received the vaccine previously should obtain the 3-dose series. The vaccine is not recommended for use in pregnant females. However, pregnancy testing is not needed before receiving a dose. If a female is found to be pregnant after receiving a dose, no treatment is needed. In that case, the remaining doses should be delayed until after the pregnancy. Immunization is recommended for any person with an immunocompromised condition through the age of 95 years if she  did not get any or all doses earlier. During the 3-dose series, the second  dose should be obtained 4-8 weeks after the first dose. The third dose should be obtained 24 weeks after the first dose and 16 weeks after the second dose.  Zoster vaccine. One dose is recommended for adults aged 86 years or older unless certain conditions are present.  Measles, mumps, and rubella (MMR) vaccine. Adults born before [birth year] generally are considered immune to measles and mumps. Adults born in [birth year] or later should have 1 or more doses of MMR vaccine unless there is a contraindication to the vaccine or there is laboratory evidence of immunity to each of the three diseases. A routine second dose of MMR vaccine should be obtained at least 28 days after the first dose for students attending postsecondary schools, health care workers, or international travelers. People who received inactivated measles vaccine or an unknown type of measles vaccine during 1963-1967 should receive 2 doses of MMR vaccine. People who received inactivated mumps vaccine or an unknown type of mumps vaccine before 1979 and are at high risk for mumps infection should consider immunization with 2 doses of MMR vaccine. For females of childbearing age, rubella immunity should be determined. If there is no evidence of immunity, females who are not pregnant should be vaccinated. If there is no evidence of immunity, females who are pregnant should delay immunization until after pregnancy. Unvaccinated health care workers born before [birth year] who lack laboratory evidence of measles, mumps, or rubella immunity or laboratory confirmation of disease should consider measles and mumps immunization with 2 doses of MMR vaccine or rubella immunization with 1 dose of MMR vaccine.  Pneumococcal 13-valent conjugate (PCV13) vaccine. When indicated, a person who is uncertain of her immunization history and has no record of immunization should receive the PCV13 vaccine. An adult  aged 74 years or older who has certain medical conditions and has not been previously immunized should receive 1 dose of PCV13 vaccine. This PCV13 should be followed with a dose of pneumococcal polysaccharide (PPSV23) vaccine. The PPSV23 vaccine dose should be obtained at least 8 weeks after the dose of PCV13 vaccine. An adult aged 53 years or older who has certain medical conditions and previously received 1 or more doses of PPSV23 vaccine should receive 1 dose of PCV13. The PCV13 vaccine dose should be obtained 1 or more years after the last PPSV23 vaccine dose.  Pneumococcal polysaccharide (PPSV23) vaccine. When PCV13 is also indicated, PCV13 should be obtained first. All adults aged 25 years and older should be immunized. An adult younger than age 79 years who has certain medical conditions should be immunized. Any person who resides in a nursing home or long-term care facility should be immunized. An adult smoker should be immunized. People with an immunocompromised condition and certain other conditions should receive both PCV13 and PPSV23 vaccines. People with human immunodeficiency virus (HIV) infection should be immunized as soon as possible after diagnosis. Immunization during chemotherapy or radiation therapy should be avoided. Routine use of PPSV23 vaccine is not recommended for American Indians, [place] Natives, or people younger than 65 years unless there are medical conditions that require PPSV23 vaccine. When indicated, people who have unknown immunization and have no record of immunization should receive PPSV23 vaccine. One-time revaccination 5 years after the first dose of PPSV23 is recommended for people aged 19-64 years who have chronic kidney failure, nephrotic syndrome, asplenia, or immunocompromised conditions. People who received 1-2 doses of PPSV23 before age 61 years should receive another dose of PPSV23 vaccine at age 31 years or later if  at least 5 years have passed since the previous  dose. Doses of PPSV23 are not needed for people immunized with PPSV23 at or after age 46 years.  Meningococcal vaccine. Adults with asplenia or persistent complement component deficiencies should receive 2 doses of quadrivalent meningococcal conjugate (MenACWY-D) vaccine. The doses should be obtained at least 2 months apart. Microbiologists working with certain meningococcal bacteria, [place] recruits, people at risk during an outbreak, and people who travel to or live in countries with a high rate of meningitis should be immunized. A first-year college student up through age 76 years who is living in a residence hall should receive a dose if she did not receive a dose on or after her 16th birthday. Adults who have certain high-risk conditions should receive one or more doses of vaccine.  Hepatitis A vaccine. Adults who wish to be protected from this disease, have certain high-risk conditions, work with hepatitis A-infected animals, work in hepatitis A research labs, or travel to or work in countries with a high rate of hepatitis A should be immunized. Adults who were previously unvaccinated and who anticipate close contact with an international adoptee during the first 60 days after arrival in the [place] from a country with a high rate of hepatitis A should be immunized.  Hepatitis B vaccine. Adults who wish to be protected from this disease, have certain high-risk conditions, may be exposed to blood or other infectious body fluids, are household contacts or sex partners of hepatitis B positive people, are clients or workers in certain care facilities, or travel to or work in countries with a high rate of hepatitis B should be immunized.  Haemophilus influenzae type b (Hib) vaccine. A previously unvaccinated person with asplenia or sickle cell disease or having a scheduled splenectomy should receive 1 dose of Hib vaccine. Regardless of previous immunization, a recipient of a hematopoietic stem cell  transplant should receive a 3-dose series 6-12 months after her successful transplant. Hib vaccine is not recommended for adults with HIV infection. Preventive Services / Frequency Ages 67 to 70 years  Blood pressure check.** / Every 1 to 2 years.  Lipid and cholesterol check.** / Every 5 years beginning at age 80.  Clinical breast exam.** / Every 3 years for women in their 66s and 71s.  BRCA-related cancer risk assessment.** / For women who have family members with a BRCA-related cancer (breast, ovarian, tubal, or peritoneal cancers).  Pap test.** / Every 2 years from ages 77 through 25. Every 3 years starting at age 58 through age 46 or 74 with a history of 3 consecutive normal Pap tests.  HPV screening.** / Every 3 years from ages 67 through ages 9 to 54 with a history of 3 consecutive normal Pap tests.  Hepatitis C blood test.** / For any individual with known risks for hepatitis C.  Skin self-exam. / Monthly.  Influenza vaccine. / Every year.  Tetanus, diphtheria, and acellular pertussis (Tdap, Td) vaccine.** / Consult your health care provider. Pregnant women should receive 1 dose of Tdap vaccine during each pregnancy. 1 dose of Td every 10 years.  Varicella vaccine.** / Consult your health care provider. Pregnant females who do not have evidence of immunity should receive the first dose after pregnancy.  HPV vaccine. / 3 doses over 6 months, if 57 and younger. The vaccine is not recommended for use in pregnant females. However, pregnancy testing is not needed before receiving a dose.  Measles, mumps, rubella (MMR) vaccine.** / You need  at least 1 dose of MMR if you were born in 1957 or later. You may also need a 2nd dose. For females of childbearing age, rubella immunity should be determined. If there is no evidence of immunity, females who are not pregnant should be vaccinated. If there is no evidence of immunity, females who are pregnant should delay immunization until after  pregnancy.  Pneumococcal 13-valent conjugate (PCV13) vaccine.** / Consult your health care provider.  Pneumococcal polysaccharide (PPSV23) vaccine.** / 1 to 2 doses if you smoke cigarettes or if you have certain conditions.  Meningococcal vaccine.** / 1 dose if you are age 27 to 78 years and a Market researcher living in a residence hall, or have one of several medical conditions, you need to get vaccinated against meningococcal disease. You may also need additional booster doses.  Hepatitis A vaccine.** / Consult your health care provider.  Hepatitis B vaccine.** / Consult your health care provider.  Haemophilus influenzae type b (Hib) vaccine.** / Consult your health care provider. Ages 86 to 75 years  Blood pressure check.** / Every 1 to 2 years.  Lipid and cholesterol check.** / Every 5 years beginning at age 69 years.  Lung cancer screening. / Every year if you are aged 28-80 years and have a 30-pack-year history of smoking and currently smoke or have quit within the past 15 years. Yearly screening is stopped once you have quit smoking for at least 15 years or develop a health problem that would prevent you from having lung cancer treatment.  Clinical breast exam.** / Every year after age 45 years.  BRCA-related cancer risk assessment.** / For women who have family members with a BRCA-related cancer (breast, ovarian, tubal, or peritoneal cancers).  Mammogram.** / Every year beginning at age 71 years and continuing for as long as you are in good health. Consult with your health care provider.  Pap test.** / Every 3 years starting at age 67 years through age 4 or 11 years with a history of 3 consecutive normal Pap tests.  HPV screening.** / Every 3 years from ages 8 years through ages 8 to 47 years with a history of 3 consecutive normal Pap tests.  Fecal occult blood test (FOBT) of stool. / Every year beginning at age 26 years and continuing until age 72 years. You may  not need to do this test if you get a colonoscopy every 10 years.  Flexible sigmoidoscopy or colonoscopy.** / Every 5 years for a flexible sigmoidoscopy or every 10 years for a colonoscopy beginning at age 38 years and continuing until age 12 years.  Hepatitis C blood test.** / For all people born from [birth year] through 1964 and any individual with known risks for hepatitis C.  Skin self-exam. / Monthly.  Influenza vaccine. / Every year.  Tetanus, diphtheria, and acellular pertussis (Tdap/Td) vaccine.** / Consult your health care provider. Pregnant women should receive 1 dose of Tdap vaccine during each pregnancy. 1 dose of Td every 10 years.  Varicella vaccine.** / Consult your health care provider. Pregnant females who do not have evidence of immunity should receive the first dose after pregnancy.  Zoster vaccine.** / 1 dose for adults aged 80 years or older.  Measles, mumps, rubella (MMR) vaccine.** / You need at least 1 dose of MMR if you were born in 1957 or later. You may also need a 2nd dose. For females of childbearing age, rubella immunity should be determined. If there is no evidence of immunity, females who  are not pregnant should be vaccinated. If there is no evidence of immunity, females who are pregnant should delay immunization until after pregnancy.  Pneumococcal 13-valent conjugate (PCV13) vaccine.** / Consult your health care provider.  Pneumococcal polysaccharide (PPSV23) vaccine.** / 1 to 2 doses if you smoke cigarettes or if you have certain conditions.  Meningococcal vaccine.** / Consult your health care provider.  Hepatitis A vaccine.** / Consult your health care provider.  Hepatitis B vaccine.** / Consult your health care provider.  Haemophilus influenzae type b (Hib) vaccine.** / Consult your health care provider. Ages 11 years and over  Blood pressure check.** / Every 1 to 2 years.  Lipid and cholesterol check.** / Every 5 years beginning at age 36 years.  Lung  cancer screening. / Every year if you are aged 73-80 years and have a 30-pack-year history of smoking and currently smoke or have quit within the past 15 years. Yearly screening is stopped once you have quit smoking for at least 15 years or develop a health problem that would prevent you from having lung cancer treatment.  Clinical breast exam.** / Every year after age 29 years.  BRCA-related cancer risk assessment.** / For women who have family members with a BRCA-related cancer (breast, ovarian, tubal, or peritoneal cancers).  Mammogram.** / Every year beginning at age 18 years and continuing for as long as you are in good health. Consult with your health care provider.  Pap test.** / Every 3 years starting at age 7 years through age 32 or 58 years with 3 consecutive normal Pap tests. Testing can be stopped between 65 and 70 years with 3 consecutive normal Pap tests and no abnormal Pap or HPV tests in the past 10 years.  HPV screening.** / Every 3 years from ages 33 years through ages 61 or 77 years with a history of 3 consecutive normal Pap tests. Testing can be stopped between 65 and 70 years with 3 consecutive normal Pap tests and no abnormal Pap or HPV tests in the past 10 years.  Fecal occult blood test (FOBT) of stool. / Every year beginning at age 27 years and continuing until age 34 years. You may not need to do this test if you get a colonoscopy every 10 years.  Flexible sigmoidoscopy or colonoscopy.** / Every 5 years for a flexible sigmoidoscopy or every 10 years for a colonoscopy beginning at age 60 years and continuing until age 59 years.  Hepatitis C blood test.** / For all people born from [birth year] through 1964 and any individual with known risks for hepatitis C.  Osteoporosis screening.** / A one-time screening for women ages 53 years and over and women at risk for fractures or osteoporosis.  Skin self-exam. / Monthly.  Influenza vaccine. / Every year.  Tetanus, diphtheria, and  acellular pertussis (Tdap/Td) vaccine.** / 1 dose of Td every 10 years.  Varicella vaccine.** / Consult your health care provider.  Zoster vaccine.** / 1 dose for adults aged 38 years or older.  Pneumococcal 13-valent conjugate (PCV13) vaccine.** / Consult your health care provider.  Pneumococcal polysaccharide (PPSV23) vaccine.** / 1 dose for all adults aged 8 years and older.  Meningococcal vaccine.** / Consult your health care provider.  Hepatitis A vaccine.** / Consult your health care provider.  Hepatitis B vaccine.** / Consult your health care provider.  Haemophilus influenzae type b (Hib) vaccine.** / Consult your health care provider. ** Family history and personal history of risk and conditions may change your health care provider's  recommendations. Document Released: 07/06/2001 Document Revised: 09/24/2013 Document Reviewed: 10/05/2010 [facility] Patient Information 2015 [place], [place]. This information is not intended to replace advice given to you by your health care provider. Make sure you discuss any questions you have with your health care provider.

## 2014-03-25 ENCOUNTER — Other Ambulatory Visit: Payer: Self-pay | Admitting: Internal Medicine

## 2014-03-25 ENCOUNTER — Ambulatory Visit
Admission: RE | Admit: 2014-03-25 | Discharge: 2014-03-25 | Disposition: A | Discharge status: Home / Self Care | Payer: Medicare Other | Source: Ambulatory Visit

## 2014-03-25 ENCOUNTER — Encounter: Payer: Self-pay | Admitting: Internal Medicine

## 2014-03-25 DIAGNOSIS — D0590 Unspecified type of carcinoma in situ of unspecified breast: Secondary | ICD-10-CM

## 2014-03-25 DIAGNOSIS — Z1231 Encounter for screening mammogram for malignant neoplasm of breast: Secondary | ICD-10-CM

## 2014-03-25 NOTE — Telephone Encounter (Signed)
Pt  Called in [facility] a [facility]   Address: [address] [place], [place], [place] [address]  [phone]

## 2014-03-27 ENCOUNTER — Other Ambulatory Visit: Payer: Self-pay | Admitting: Internal Medicine

## 2014-03-27 DIAGNOSIS — N644 Mastodynia: Secondary | ICD-10-CM

## 2014-03-27 DIAGNOSIS — Z1231 Encounter for screening mammogram for malignant neoplasm of breast: Secondary | ICD-10-CM

## 2014-04-15 ENCOUNTER — Ambulatory Visit
Admission: RE | Admit: 2014-04-15 | Discharge: 2014-04-15 | Disposition: A | Discharge status: Home / Self Care | Payer: Medicare Other | Source: Ambulatory Visit | Attending: Internal Medicine | Admitting: Internal Medicine

## 2014-04-15 DIAGNOSIS — N644 Mastodynia: Secondary | ICD-10-CM

## 2014-04-15 DIAGNOSIS — R920 Mammographic microcalcification found on diagnostic imaging of breast: Secondary | ICD-10-CM | POA: Diagnosis not present

## 2014-04-15 LAB — HM MAMMOGRAPHY: HM Mammogram: NORMAL

## 2014-04-17 ENCOUNTER — Encounter: Payer: Self-pay | Admitting: Internal Medicine

## 2014-04-17 LAB — HM MAMMOGRAPHY: HM Mammogram: NORMAL

## 2014-10-17 DIAGNOSIS — H02831 Dermatochalasis of right upper eyelid: Secondary | ICD-10-CM | POA: Diagnosis not present

## 2014-10-17 DIAGNOSIS — H02834 Dermatochalasis of left upper eyelid: Secondary | ICD-10-CM | POA: Diagnosis not present

## 2014-10-17 DIAGNOSIS — H2513 Age-related nuclear cataract, bilateral: Secondary | ICD-10-CM | POA: Diagnosis not present

## 2014-12-03 ENCOUNTER — Ambulatory Visit (INDEPENDENT_AMBULATORY_CARE_PROVIDER_SITE_OTHER)
Admission: RE | Admit: 2014-12-03 | Discharge: 2014-12-03 | Disposition: A | Discharge status: Home / Self Care | Payer: Medicare Other | Source: Ambulatory Visit | Attending: Internal Medicine | Admitting: Internal Medicine

## 2014-12-03 ENCOUNTER — Ambulatory Visit (INDEPENDENT_AMBULATORY_CARE_PROVIDER_SITE_OTHER): Payer: Medicare Other | Admitting: Internal Medicine

## 2014-12-03 ENCOUNTER — Encounter: Payer: Self-pay | Admitting: Internal Medicine

## 2014-12-03 VITALS — BP 120/82 | HR 73 | Temp 98.0°F | Resp 16 | Ht 64.0 in | Wt 153.2 lb

## 2014-12-03 DIAGNOSIS — R0789 Other chest pain: Secondary | ICD-10-CM

## 2014-12-03 DIAGNOSIS — M940 Chondrocostal junction syndrome [Tietze]: Secondary | ICD-10-CM | POA: Insufficient documentation

## 2014-12-03 NOTE — Progress Notes (Signed)
Pre visit review using our clinic review tool, if applicable. No additional management support is needed unless otherwise documented below in the visit note. 

## 2014-12-03 NOTE — Progress Notes (Signed)
Subjective:  Patient ID: Jessica Woodward, female    DOB: [birth year]  Age: 74 y.o. MRN: [id number]  CC: Chest Pain   HPI Jessica Woodward presents for the complaint of pain over her left lower rib cage for about 3-4 weeks. She describes the pain as an achy sensation that becomes sharp whenever she presses on the area or moves her rib cage. She denies any trauma or injury. She takes an anti-inflammatory and gets symptom relief. She has not experienced any cough, dyspnea on exertion, shortness of breath, hemoptysis, rash, dizziness, diaphoresis or edema.  Outpatient Prescriptions Prior to Visit  Medication Sig Dispense Refill  . aspirin 81 MG tablet Take 81 mg by mouth daily.      Marland Kitchen atorvastatin (LIPITOR) 40 MG tablet Take 1 tablet (40 mg total) by mouth daily. 90 tablet 3  . Biotin 5000 MCG CAPS Take 2 capsules by mouth daily.     . irbesartan-hydrochlorothiazide (AVALIDE) 150-12.5 MG per tablet Take 1 tablet by mouth daily. 90 tablet 3  . loratadine (CLARITIN) 10 MG tablet Take 10 mg by mouth daily as needed.      . Multiple Vitamins-Minerals (ICAPS PO) Take 1 capsule by mouth daily.    . naproxen sodium (ANAPROX) 220 MG tablet Take 220 mg by mouth 2 (two) times daily with a meal.    . OVER THE COUNTER MEDICATION Take by mouth daily. Fiber supplement    . Glucosamine-Chondroit-Vit C-Mn (GLUCOSAMINE-CHONDROITIN) TABS Take by mouth daily.       No facility-administered medications prior to visit.    ROS Review of Systems  Constitutional: Negative.  Negative for fever, chills, diaphoresis, activity change, appetite change, fatigue and unexpected weight change.  HENT: Negative.  Negative for sinus pressure and trouble swallowing.   Eyes: Negative.  Negative for photophobia and visual disturbance.  Respiratory: Negative.  Negative for apnea, cough, choking, chest tightness, shortness of breath, wheezing and stridor.   Cardiovascular: Positive for chest pain. Negative for palpitations and leg  swelling.  Gastrointestinal: Negative.  Negative for nausea, vomiting, abdominal pain, diarrhea, constipation and blood in stool.  Endocrine: Negative.   Genitourinary: Negative.  Negative for dysuria, frequency, hematuria, flank pain and difficulty urinating.  Musculoskeletal: Negative.   Skin: Negative.   Allergic/Immunologic: Negative.   Neurological: Negative.  Negative for dizziness, syncope, speech difficulty, light-headedness and headaches.  Hematological: Negative.   Psychiatric/Behavioral: Negative.     Objective:  BP 120/82 mmHg  Pulse 73  Temp(Src) 98 F (36.7 C) (Oral)  Ht 5\' 4"  (1.626 m)  Wt 153 lb 4 oz (69.514 kg)  BMI 26.29 kg/m2  SpO2 94%  BP Readings from Last 3 Encounters:  12/03/14 120/82  03/20/14 128/82  10/23/13 130/80    Wt Readings from Last 3 Encounters:  12/03/14 153 lb 4 oz (69.514 kg)  03/20/14 151 lb (68.493 kg)  10/23/13 151 lb 8 oz (68.72 kg)    Physical Exam  Constitutional: She is oriented to person, place, and time. She appears well-developed and well-nourished.  Non-toxic appearance. She does not have a sickly appearance. She does not appear ill. No distress.  HENT:  Mouth/Throat: Oropharynx is clear and moist. No oropharyngeal exudate.  Eyes: Conjunctivae are normal. Right eye exhibits no discharge. Left eye exhibits no discharge. No scleral icterus.  Neck: Normal range of motion. Neck supple. No JVD present. No tracheal deviation present. No thyromegaly present.  Cardiovascular: Normal rate, regular rhythm, normal heart sounds and intact distal pulses.  Exam  reveals no gallop and no friction rub.   No murmur heard. Pulmonary/Chest: Effort normal and breath sounds normal. No stridor. No respiratory distress. She has no decreased breath sounds. She has no wheezes. She has no rhonchi. She has no rales. Chest wall is not dull to percussion. She exhibits tenderness. She exhibits no mass, no bony tenderness, no laceration, no crepitus, no  edema, no deformity, no swelling and no retraction. Right breast exhibits no inverted nipple, no mass, no nipple discharge, no skin change and no tenderness. Left breast exhibits no inverted nipple, no mass, no nipple discharge, no skin change and no tenderness. Breasts are symmetrical.    Abdominal: Soft. Bowel sounds are normal. She exhibits no distension and no mass. There is no tenderness. There is no rebound and no guarding.  Musculoskeletal: Normal range of motion. She exhibits no edema or tenderness.  Lymphadenopathy:    She has no cervical adenopathy.  Neurological: She is oriented to person, place, and time.  Skin: Skin is warm and dry. No rash noted. She is not diaphoretic. No erythema. No pallor.  Psychiatric: She has a normal mood and affect. Her behavior is normal. Judgment and thought content normal.  Vitals reviewed.   Lab Results  Component Value Date   WBC 8.1 03/20/2014   HGB 13.5 03/20/2014   HCT 39.9 03/20/2014   PLT 298.0 03/20/2014   GLUCOSE 90 03/20/2014   CHOL 186 03/20/2014   TRIG 114.0 03/20/2014   HDL 71.10 03/20/2014   LDLDIRECT 166.9 03/23/2013   LDLCALC 92 03/20/2014   ALT 15 03/20/2014   AST 18 03/20/2014   NA 139 03/20/2014   K 4.2 03/20/2014   CL 107 03/20/2014   CREATININE 0.8 03/20/2014   BUN 22 03/20/2014   CO2 26 03/20/2014   TSH 3.00 03/20/2014   HGBA1C 5.4 01/17/2012    US Breast [place]  04/17/2014   ADDENDUM REPORT: 04/17/2014 12:15  ADDENDUM: On physical exam, I do not palpate a mass in the area of focal pain in the upper-outer quadrant of the left breast.  Sonographic evaluation area of focal pain in the upper-outer quadrant of the left breast shows normal tissue. No solid or cystic mass, abnormal shadowing or distortion visualized.  Bilateral diagnostic mammogram in 1 year is recommended.  2: Benign.   Electronically Signed   By: Lillia Mountain M.D.   On: 04/17/2014 12:15   04/17/2014   CLINICAL DATA:  History of left  breast cancer status post lumpectomy and radiation therapy in 2002. Patient complains of focal pain in the upper-outer quadrant of the left breast.  EXAM: DIGITAL DIAGNOSTIC BILATERAL MAMMOGRAM WITH 3D TOMOSYNTHESIS AND CAD  COMPARISON:  With priors.  ACR Breast Density Category b: There are scattered areas of fibroglandular density.all  FINDINGS: Stable lumpectomy changes are seen in the left breast. There is no suspicious mass or malignant type microcalcifications in either breast. Spot tangential view of the area of focal pain in the left breast shows normal fibro fatty tissue.  Mammographic images were processed with CAD.  IMPRESSION: No evidence of malignancy in either breast.  RECOMMENDATION: Bilateral screening mammogram in 1 year is recommended.  I have discussed the findings and recommendations with the patient. Results were also provided in writing at the conclusion of the visit. If applicable, a reminder letter will be sent to the patient regarding the next appointment.  BI-RADS CATEGORY  2: Benign.  Electronically Signed: By: Lillia Mountain M.D. On: 04/15/2014  08:24   Mm Diag Breast Tomo Bilateral  04/17/2014   ADDENDUM REPORT: 04/17/2014 12:15  ADDENDUM: On physical exam, I do not palpate a mass in the area of focal pain in the upper-outer quadrant of the left breast.  Sonographic evaluation area of focal pain in the upper-outer quadrant of the left breast shows normal tissue. No solid or cystic mass, abnormal shadowing or distortion visualized.  Bilateral diagnostic mammogram in 1 year is recommended.  2: Benign.   Electronically Signed   By: Lillia Mountain M.D.   On: 04/17/2014 12:15   04/17/2014   CLINICAL DATA:  History of left breast cancer status post lumpectomy and radiation therapy in 2002. Patient complains of focal pain in the upper-outer quadrant of the left breast.  EXAM: DIGITAL DIAGNOSTIC BILATERAL MAMMOGRAM WITH 3D TOMOSYNTHESIS AND CAD  COMPARISON:  With priors.  ACR Breast Density  Category b: There are scattered areas of fibroglandular density.all  FINDINGS: Stable lumpectomy changes are seen in the left breast. There is no suspicious mass or malignant type microcalcifications in either breast. Spot tangential view of the area of focal pain in the left breast shows normal fibro fatty tissue.  Mammographic images were processed with CAD.  IMPRESSION: No evidence of malignancy in either breast.  RECOMMENDATION: Bilateral screening mammogram in 1 year is recommended.  I have discussed the findings and recommendations with the patient. Results were also provided in writing at the conclusion of the visit. If applicable, a reminder letter will be sent to the patient regarding the next appointment.  BI-RADS CATEGORY  2: Benign.  Electronically Signed: By: Lillia Mountain M.D. On: 04/15/2014 08:24    Assessment & Plan:   Michon was seen today for chest pain.  Diagnoses and all orders for this visit:  Left-sided chest wall pain - her symptoms and exam are consistent with musculoskeletal chest wall pain. Her chest x-ray is within normal limits showing no bony lesions. She is taking an anti-inflammatory and getting symptom relief. She does not want anything in addition to that for relief of her pain. She will apply warm compresses to the area. She will let me know if she develops any new or worsening symptoms. We'll recheck her in about 2-3 weeks. Orders: -     DG Chest 2 View; Future   I am having Ms. Friddle maintain her aspirin, loratadine, Glucosamine-Chondroitin, OVER THE COUNTER MEDICATION, Biotin, naproxen sodium, Multiple Vitamins-Minerals (ICAPS PO), irbesartan-hydrochlorothiazide, and atorvastatin.  No orders of the defined types were placed in this encounter.     Follow-up: Return in about 3 weeks (around 12/24/2014).  Scarlette Calico, MD

## 2014-12-03 NOTE — Patient Instructions (Signed)

## 2014-12-04 ENCOUNTER — Encounter: Payer: Self-pay | Admitting: Internal Medicine

## 2014-12-04 DIAGNOSIS — Z85828 Personal history of other malignant neoplasm of skin: Secondary | ICD-10-CM | POA: Diagnosis not present

## 2014-12-04 DIAGNOSIS — L821 Other seborrheic keratosis: Secondary | ICD-10-CM | POA: Diagnosis not present

## 2014-12-06 ENCOUNTER — Telehealth: Payer: Self-pay | Admitting: Internal Medicine

## 2014-12-06 DIAGNOSIS — Z13820 Encounter for screening for osteoporosis: Secondary | ICD-10-CM

## 2014-12-06 NOTE — Telephone Encounter (Signed)
Patient is requesting a dexa.  Please advise if I can schedule or if patient needs a visit with Dr. Ronnald Ramp first.  Thanks!

## 2014-12-06 NOTE — Telephone Encounter (Signed)
Got scheduled  °

## 2015-02-06 ENCOUNTER — Encounter: Payer: Self-pay | Admitting: Internal Medicine

## 2015-02-06 ENCOUNTER — Ambulatory Visit (INDEPENDENT_AMBULATORY_CARE_PROVIDER_SITE_OTHER)
Admission: RE | Admit: 2015-02-06 | Discharge: 2015-02-06 | Disposition: A | Discharge status: Home / Self Care | Payer: Medicare Other | Source: Ambulatory Visit | Attending: Internal Medicine | Admitting: Internal Medicine

## 2015-02-06 ENCOUNTER — Other Ambulatory Visit (INDEPENDENT_AMBULATORY_CARE_PROVIDER_SITE_OTHER): Payer: Medicare Other

## 2015-02-06 ENCOUNTER — Ambulatory Visit (INDEPENDENT_AMBULATORY_CARE_PROVIDER_SITE_OTHER): Payer: Medicare Other | Admitting: Internal Medicine

## 2015-02-06 ENCOUNTER — Other Ambulatory Visit: Payer: Self-pay | Admitting: Internal Medicine

## 2015-02-06 VITALS — BP 108/76 | HR 64 | Temp 97.9°F | Resp 18 | Wt 154.0 lb

## 2015-02-06 DIAGNOSIS — D0592 Unspecified type of carcinoma in situ of left breast: Secondary | ICD-10-CM | POA: Diagnosis not present

## 2015-02-06 DIAGNOSIS — R0789 Other chest pain: Secondary | ICD-10-CM

## 2015-02-06 DIAGNOSIS — R079 Chest pain, unspecified: Secondary | ICD-10-CM | POA: Diagnosis not present

## 2015-02-06 LAB — CBC WITH DIFFERENTIAL/PLATELET
Basophils Absolute: 0 10*3/uL (ref 0.0–0.1)
Basophils Relative: 0.6 % (ref 0.0–3.0)
Eosinophils Absolute: 0.1 10*3/uL (ref 0.0–0.7)
Eosinophils Relative: 2 % (ref 0.0–5.0)
HCT: 39.2 % (ref 36.0–46.0)
Hemoglobin: 13.4 g/dL (ref 12.0–15.0)
Lymphocytes Relative: 29.7 % (ref 12.0–46.0)
Lymphs Abs: 2 10*3/uL (ref 0.7–4.0)
MCHC: 34 g/dL (ref 30.0–36.0)
MCV: 90.3 fl (ref 78.0–100.0)
Monocytes Absolute: 0.7 10*3/uL (ref 0.1–1.0)
Monocytes Relative: 10.2 % (ref 3.0–12.0)
Neutro Abs: 3.8 10*3/uL (ref 1.4–7.7)
Neutrophils Relative %: 57.5 % (ref 43.0–77.0)
Platelets: 261 10*3/uL (ref 150.0–400.0)
RBC: 4.34 Mil/uL (ref 3.87–5.11)
RDW: 13.7 % (ref 11.5–15.5)
WBC: 6.6 10*3/uL (ref 4.0–10.5)

## 2015-02-06 LAB — BASIC METABOLIC PANEL
BUN: 24 mg/dL — ABNORMAL HIGH (ref 6–23)
CO2: 29 mEq/L (ref 19–32)
Calcium: 10 mg/dL (ref 8.4–10.5)
Chloride: 104 mEq/L (ref 96–112)
Creatinine, Ser: 0.8 mg/dL (ref 0.40–1.20)
GFR: 74.6 mL/min (ref >60.00)
Glucose, Bld: 86 mg/dL (ref 70–99)
Potassium: 4.6 mEq/L (ref 3.5–5.1)
Sodium: 138 mEq/L (ref 135–145)

## 2015-02-06 LAB — SEDIMENTATION RATE: Sed Rate: 14 mm/hr (ref 0–22)

## 2015-02-06 MED ORDER — TRAMADOL HCL 50 MG PO TABS: 50.0000 mg | ORAL_TABLET | Freq: Three times a day (TID) | ORAL | Status: DC | PRN

## 2015-02-06 NOTE — Patient Instructions (Signed)
Use an anti-inflammatory cream such as Aspercreme or Zostrix cream twice a day to the affected area as needed. In lieu of this warm moist compresses or  hot water bottle can be used. Do not apply ice . Consider glucosamine sulfate 1500 mg daily for chest  symptoms. Take this daily  for 3 months and then leave it off for 2 months. This will rehydrate the cartilages.

## 2015-02-06 NOTE — Progress Notes (Signed)
   Subjective:    Patient ID: Jessica Woodward, female    DOB: [birth year], 74 y.o.   MRN: [id number]  HPI   She describes constant "bruised" sensation in the left inferior rib cage since May or June of this year. She saw her primary care physician in July  and chest x-ray was negative except for an old rib fracture of the sixth right rib.  She became concerned as yesterday the pain was stabbing like "hot pins". It could be exacerbated by turning in bed or stretching. There has never been any injury or definite trigger for the pain. She has no other cardiopulmonary or GI symptoms.  She is a breast cancer survivor since 2002. She is an ex- smoker as of 1965.  There is no personal or family history of lung disease. Both parents had bypass surgery.   Review of Systems  There is no significant cough, sputum production,hemoptysis, wheezing,or  paroxysmal nocturnal dyspnea. Unexplained weight loss, abdominal pain, significant dyspepsia, dysphagia, melena, rectal bleeding, or persistently small caliber stools are not present. Dysuria, pyuria, hematuria, frequency, nocturia or polyuria are denied.    Objective:   Physical Exam Pertinent or positive findings include: There is visible asymmetry of the left inferior rib cage with point tenderness to palpation. There is accentuated curvature of the upper thoracic spine. Homans sign is negative bilaterally.  General appearance :adequately nourished; in no distress.  Eyes: No conjunctival inflammation or scleral icterus is present.  Oral exam:  Lips and gums are healthy appearing.There is no oropharyngeal erythema or exudate noted. Dental hygiene is good.  Heart:  Normal rate and regular rhythm. S1 and S2 normal without gallop, murmur, click, rub or other extra sounds    Lungs:Chest clear to auscultation; no wheezes, rhonchi,rales ,or rubs present.No increased work of breathing.   Abdomen: bowel sounds normal, soft and non-tender without masses,  organomegaly or hernias noted.  No guarding or rebound.   Vascular : all pulses equal ; no bruits present.  Skin:Warm & dry.  Intact without suspicious lesions or rashes ; no tenting or jaundice   Lymphatic: No lymphadenopathy is noted about the head, neck, axilla.   Neuro: Strength, tone & DTRs normal.     Assessment & Plan:  #1 chest wall pain with visable asymmetry of the inferior left rib cage  #2 history of breast cancer  See orders and recommendations. If chest x-ray is nondiagnostic; bone scan will be ordered

## 2015-02-06 NOTE — Progress Notes (Signed)
Pre visit review using our clinic review tool, if applicable. No additional management support is needed unless otherwise documented below in the visit note. 

## 2015-02-17 ENCOUNTER — Other Ambulatory Visit: Payer: Self-pay | Admitting: Internal Medicine

## 2015-02-17 DIAGNOSIS — Z23 Encounter for immunization: Secondary | ICD-10-CM | POA: Diagnosis not present

## 2015-02-17 DIAGNOSIS — R0789 Other chest pain: Secondary | ICD-10-CM

## 2015-02-17 DIAGNOSIS — Z853 Personal history of malignant neoplasm of breast: Secondary | ICD-10-CM

## 2015-02-18 ENCOUNTER — Encounter: Payer: Self-pay | Admitting: Internal Medicine

## 2015-02-18 ENCOUNTER — Ambulatory Visit (INDEPENDENT_AMBULATORY_CARE_PROVIDER_SITE_OTHER): Payer: Medicare Other | Admitting: Internal Medicine

## 2015-02-18 ENCOUNTER — Other Ambulatory Visit: Payer: Self-pay | Admitting: Internal Medicine

## 2015-02-18 VITALS — BP 110/78 | HR 67 | Temp 98.7°F | Resp 16 | Ht 64.0 in | Wt 153.0 lb

## 2015-02-18 DIAGNOSIS — R0789 Other chest pain: Secondary | ICD-10-CM

## 2015-02-18 DIAGNOSIS — Z853 Personal history of malignant neoplasm of breast: Secondary | ICD-10-CM

## 2015-02-18 DIAGNOSIS — I10 Essential (primary) hypertension: Secondary | ICD-10-CM | POA: Diagnosis not present

## 2015-02-18 NOTE — Patient Instructions (Signed)

## 2015-02-18 NOTE — Progress Notes (Signed)
Subjective:  Patient ID: Jessica Woodward, female    DOB: 1940-07-23  Age: 74 y.o. MRN: [id number]  CC: Chest Pain   HPI PANTERA WINTERROWD presents for f/up regarding persistent pain over her left lower anterolateral rib cage, she describes the pain as an intermittent soreness that becomes sharp with movement and palpation. She was seen by another MD last week and CXR was repeated (normal), a NM bone scan has been ordered to check for mass/lytic lesion, she was given an Rx for tramadol for the pain but she has not had it filled because she was concerned that it might be "too much medicine" for her.  Outpatient Prescriptions Prior to Visit  Medication Sig Dispense Refill  . aspirin 81 MG tablet Take 81 mg by mouth daily.      Marland Kitchen atorvastatin (LIPITOR) 40 MG tablet Take 1 tablet (40 mg total) by mouth daily. 90 tablet 3  . Biotin 5000 MCG CAPS Take 2 capsules by mouth daily.     . irbesartan-hydrochlorothiazide (AVALIDE) 150-12.5 MG per tablet Take 1 tablet by mouth daily. 90 tablet 3  . loratadine (CLARITIN) 10 MG tablet Take 10 mg by mouth daily as needed.      . naproxen sodium (ANAPROX) 220 MG tablet Take 220 mg by mouth 2 (two) times daily with a meal.    . OVER THE COUNTER MEDICATION Take by mouth daily. Fiber supplement    . traMADol (ULTRAM) 50 MG tablet Take 1 tablet (50 mg total) by mouth every 8 (eight) hours as needed. 30 tablet 0   No facility-administered medications prior to visit.    ROS Review of Systems  Constitutional: Negative.  Negative for fever, chills, diaphoresis, appetite change and fatigue.  HENT: Negative.  Negative for trouble swallowing and voice change.   Eyes: Negative.   Respiratory: Negative.  Negative for cough, choking, chest tightness, shortness of breath and stridor.   Cardiovascular: Positive for chest pain. Negative for palpitations and leg swelling.  Gastrointestinal: Negative.  Negative for nausea, vomiting, abdominal pain, diarrhea and blood in  stool.  Endocrine: Negative.   Genitourinary: Negative.   Musculoskeletal: Negative.  Negative for myalgias, back pain, arthralgias and neck pain.  Skin: Negative.  Negative for rash.  Allergic/Immunologic: Negative.   Neurological: Negative.  Negative for dizziness, tremors, syncope, light-headedness, numbness and headaches.  Hematological: Negative.  Negative for adenopathy. Does not bruise/bleed easily.  Psychiatric/Behavioral: Negative.     Objective:  BP 110/78 mmHg  Pulse 67  Temp(Src) 98.7 F (37.1 C) (Oral)  Resp 16  Ht 5\' 4"  (1.626 m)  Wt 153 lb (69.4 kg)  BMI 26.25 kg/m2  SpO2 97%  BP Readings from Last 3 Encounters:  02/18/15 110/78  02/06/15 108/76  12/03/14 120/82    Wt Readings from Last 3 Encounters:  02/18/15 153 lb (69.4 kg)  02/06/15 154 lb (69.854 kg)  12/03/14 153 lb 4 oz (69.514 kg)    Physical Exam  Constitutional: She is oriented to person, place, and time.  Non-toxic appearance. She does not have a sickly appearance. She does not appear ill. No distress.  HENT:  Mouth/Throat: Oropharynx is clear and moist. No oropharyngeal exudate.  Eyes: Conjunctivae are normal. Right eye exhibits no discharge. Left eye exhibits no discharge. No scleral icterus.  Neck: Normal range of motion. Neck supple. No JVD present. No tracheal deviation present. No thyromegaly present.  Cardiovascular: Normal rate, regular rhythm, normal heart sounds and intact distal pulses.  Exam reveals no  gallop and no friction rub.   No murmur heard. Pulmonary/Chest: Effort normal and breath sounds normal. No stridor. No respiratory distress. She has no wheezes. She has no rales. She exhibits tenderness and bony tenderness. She exhibits no mass, no crepitus, no edema, no deformity, no swelling and no retraction.    Abdominal: Soft. Bowel sounds are normal. She exhibits no distension and no mass. There is no tenderness. There is no rebound and no guarding.  Musculoskeletal: Normal range  of motion. She exhibits no edema or tenderness.  Lymphadenopathy:    She has no cervical adenopathy.  Neurological: She is oriented to person, place, and time.  Skin: Skin is warm and dry. No rash noted. She is not diaphoretic. No erythema. No pallor.  Psychiatric: She has a normal mood and affect. Her behavior is normal. Judgment and thought content normal.    Lab Results  Component Value Date   WBC 6.6 02/06/2015   HGB 13.4 02/06/2015   HCT 39.2 02/06/2015   PLT 261.0 02/06/2015   GLUCOSE 86 02/06/2015   CHOL 186 03/20/2014   TRIG 114.0 03/20/2014   HDL 71.10 03/20/2014   LDLDIRECT 166.9 03/23/2013   LDLCALC 92 03/20/2014   ALT 15 03/20/2014   AST 18 03/20/2014   NA 138 02/06/2015   K 4.6 02/06/2015   CL 104 02/06/2015   CREATININE 0.80 02/06/2015   BUN 24* 02/06/2015   CO2 29 02/06/2015   TSH 3.00 03/20/2014   HGBA1C 5.4 01/17/2012    Dg Chest 2 View  02/06/2015   CLINICAL DATA:  Left chest pain.  EXAM: CHEST  2 VIEW  COMPARISON:  12/03/2014.  FINDINGS: Mediastinum and hilar structures are normal. Lungs are clear. Heart size normal. No pleural effusion or pneumothorax. Surgical clips noted over the chest. Old healed right anterior sixth rib fracture.  IMPRESSION: No acute cardiopulmonary disease.  Chest is stable from prior exam.   Electronically Signed   By: Marcello Moores  Register   On: 02/06/2015 13:45    Assessment & Plan:   There are no diagnoses linked to this encounter. I am having Ms. Topel maintain her aspirin, loratadine, OVER THE COUNTER MEDICATION, Biotin, naproxen sodium, irbesartan-hydrochlorothiazide, atorvastatin, and traMADol.  No orders of the defined types were placed in this encounter.     Follow-up: Return in about 6 weeks (around 04/01/2015).  Scarlette Calico, MD

## 2015-02-18 NOTE — Assessment & Plan Note (Signed)
Her BP is well controlled 

## 2015-02-18 NOTE — Progress Notes (Signed)
Pre visit review using our clinic review tool, if applicable. No additional management support is needed unless otherwise documented below in the visit note. 

## 2015-02-18 NOTE — Assessment & Plan Note (Signed)
I await the results on the NM bone scan She was encouraged to try tramadol for the pain, will cont an nsaid

## 2015-02-20 ENCOUNTER — Other Ambulatory Visit: Payer: Self-pay | Admitting: Oncology

## 2015-02-20 DIAGNOSIS — Z853 Personal history of malignant neoplasm of breast: Secondary | ICD-10-CM

## 2015-02-28 ENCOUNTER — Other Ambulatory Visit: Payer: Self-pay | Admitting: Internal Medicine

## 2015-02-28 DIAGNOSIS — Z853 Personal history of malignant neoplasm of breast: Secondary | ICD-10-CM

## 2015-02-28 DIAGNOSIS — R0789 Other chest pain: Secondary | ICD-10-CM

## 2015-03-07 ENCOUNTER — Ambulatory Visit (HOSPITAL_COMMUNITY)
Admission: RE | Admit: 2015-03-07 | Discharge: 2015-03-07 | Disposition: A | Discharge status: Home / Self Care | Payer: Medicare Other | Source: Ambulatory Visit | Attending: Internal Medicine | Admitting: Internal Medicine

## 2015-03-07 ENCOUNTER — Encounter (HOSPITAL_COMMUNITY)
Admission: RE | Admit: 2015-03-07 | Discharge: 2015-03-07 | Disposition: A | Discharge status: Home / Self Care | Payer: Medicare Other | Source: Ambulatory Visit | Attending: Internal Medicine | Admitting: Internal Medicine

## 2015-03-07 DIAGNOSIS — R0789 Other chest pain: Secondary | ICD-10-CM | POA: Insufficient documentation

## 2015-03-07 DIAGNOSIS — Z853 Personal history of malignant neoplasm of breast: Secondary | ICD-10-CM | POA: Diagnosis not present

## 2015-03-07 MED ORDER — TECHNETIUM TC 99M MEDRONATE IV KIT
24.6000 | PACK | Freq: Once | INTRAVENOUS | Status: AC | PRN
  Administered 2015-03-07: 24.6 via INTRAVENOUS

## 2015-04-15 ENCOUNTER — Ambulatory Visit (INDEPENDENT_AMBULATORY_CARE_PROVIDER_SITE_OTHER)
Admission: RE | Admit: 2015-04-15 | Discharge: 2015-04-15 | Disposition: A | Discharge status: Home / Self Care | Payer: Medicare Other | Source: Ambulatory Visit | Attending: Internal Medicine | Admitting: Internal Medicine

## 2015-04-15 ENCOUNTER — Encounter: Payer: Self-pay | Admitting: Internal Medicine

## 2015-04-15 ENCOUNTER — Ambulatory Visit (INDEPENDENT_AMBULATORY_CARE_PROVIDER_SITE_OTHER): Payer: Medicare Other | Admitting: Internal Medicine

## 2015-04-15 ENCOUNTER — Other Ambulatory Visit (INDEPENDENT_AMBULATORY_CARE_PROVIDER_SITE_OTHER): Payer: Medicare Other

## 2015-04-15 ENCOUNTER — Ambulatory Visit
Admission: RE | Admit: 2015-04-15 | Discharge: 2015-04-15 | Disposition: A | Discharge status: Home / Self Care | Payer: Medicare Other | Source: Ambulatory Visit | Attending: Internal Medicine | Admitting: Internal Medicine

## 2015-04-15 VITALS — BP 118/74 | HR 64 | Temp 98.3°F | Resp 20 | Ht 64.0 in | Wt 150.0 lb

## 2015-04-15 DIAGNOSIS — I10 Essential (primary) hypertension: Secondary | ICD-10-CM | POA: Diagnosis not present

## 2015-04-15 DIAGNOSIS — E785 Hyperlipidemia, unspecified: Secondary | ICD-10-CM | POA: Diagnosis not present

## 2015-04-15 DIAGNOSIS — Z780 Asymptomatic menopausal state: Secondary | ICD-10-CM

## 2015-04-15 DIAGNOSIS — Z13820 Encounter for screening for osteoporosis: Secondary | ICD-10-CM

## 2015-04-15 DIAGNOSIS — Z23 Encounter for immunization: Secondary | ICD-10-CM

## 2015-04-15 LAB — LIPID PANEL
Cholesterol: 179 mg/dL (ref 0–200)
HDL: 60.3 mg/dL (ref >39.00)
LDL Cholesterol: 100 mg/dL — ABNORMAL HIGH (ref 0–99)
NonHDL: 118.5
Total CHOL/HDL Ratio: 3
Triglycerides: 91 mg/dL (ref 0.0–149.0)
VLDL: 18.2 mg/dL (ref 0.0–40.0)

## 2015-04-15 LAB — TSH: TSH: 3.04 u[IU]/mL (ref 0.35–4.50)

## 2015-04-15 LAB — HM DEXA SCAN: HM Dexa Scan: NORMAL

## 2015-04-15 MED ORDER — ATORVASTATIN CALCIUM 40 MG PO TABS: 40.0000 mg | ORAL_TABLET | Freq: Every day | ORAL | Status: DC

## 2015-04-15 MED ORDER — IRBESARTAN-HYDROCHLOROTHIAZIDE 150-12.5 MG PO TABS: 1.0000 | ORAL_TABLET | Freq: Every day | ORAL | Status: DC

## 2015-04-15 NOTE — Patient Instructions (Signed)
Hypertension Hypertension, commonly called high blood pressure, is when the force of blood pumping through your arteries is too strong. Your arteries are the blood vessels that carry blood from your heart throughout your body. A blood pressure reading consists of a higher number over a lower number, such as 110/72. The higher number (systolic) is the pressure inside your arteries when your heart pumps. The lower number (diastolic) is the pressure inside your arteries when your heart relaxes. Ideally you want your blood pressure below 120/80. Hypertension forces your heart to work harder to pump blood. Your arteries may become narrow or stiff. Having untreated or uncontrolled hypertension can cause heart attack, stroke, kidney disease, and other problems. RISK FACTORS Some risk factors for high blood pressure are controllable. Others are not.  Risk factors you cannot control include:   Race. You may be at higher risk if you are African American.  Age. Risk increases with age.  Gender. Men are at higher risk than women before age 45 years. After age 65, women are at higher risk than men. Risk factors you can control include:  Not getting enough exercise or physical activity.  Being overweight.  Getting too much fat, sugar, calories, or salt in your diet.  Drinking too much alcohol. SIGNS AND SYMPTOMS Hypertension does not usually cause signs or symptoms. Extremely high blood pressure (hypertensive crisis) may cause headache, anxiety, shortness of breath, and nosebleed. DIAGNOSIS To check if you have hypertension, your health care provider will measure your blood pressure while you are seated, with your arm held at the level of your heart. It should be measured at least twice using the same arm. Certain conditions can cause a difference in blood pressure between your right and left arms. A blood pressure reading that is higher than normal on one occasion does not mean that you need treatment. If  it is not clear whether you have high blood pressure, you may be asked to return on a different day to have your blood pressure checked again. Or, you may be asked to monitor your blood pressure at home for 1 or more weeks. TREATMENT Treating high blood pressure includes making lifestyle changes and possibly taking medicine. Living a healthy lifestyle can help lower high blood pressure. You may need to change some of your habits. Lifestyle changes may include:  Following the DASH diet. This diet is high in fruits, vegetables, and whole grains. It is low in salt, red meat, and added sugars.  Keep your sodium intake below 2,300 mg per day.  Getting at least 30-45 minutes of aerobic exercise at least 4 times per week.  Losing weight if necessary.  Not smoking.  Limiting alcoholic beverages.  Learning ways to reduce stress. Your health care provider may prescribe medicine if lifestyle changes are not enough to get your blood pressure under control, and if one of the following is true:  You are 18-59 years of age and your systolic blood pressure is above 140.  You are 60 years of age or older, and your systolic blood pressure is above 150.  Your diastolic blood pressure is above 90.  You have diabetes, and your systolic blood pressure is over 140 or your diastolic blood pressure is over 90.  You have kidney disease and your blood pressure is above 140/90.  You have heart disease and your blood pressure is above 140/90. Your personal target blood pressure may vary depending on your medical conditions, your age, and other factors. HOME CARE INSTRUCTIONS    Have your blood pressure rechecked as directed by your health care provider.   Take medicines only as directed by your health care provider. Follow the directions carefully. Blood pressure medicines must be taken as prescribed. The medicine does not work as well when you skip doses. Skipping doses also puts you at risk for  problems.  Do not smoke.   Monitor your blood pressure at home as directed by your health care provider. SEEK MEDICAL CARE IF:   You think you are having a reaction to medicines taken.  You have recurrent headaches or feel dizzy.  You have swelling in your ankles.  You have trouble with your vision. SEEK IMMEDIATE MEDICAL CARE IF:  You develop a severe headache or confusion.  You have unusual weakness, numbness, or feel faint.  You have severe chest or abdominal pain.  You vomit repeatedly.  You have trouble breathing. MAKE SURE YOU:   Understand these instructions.  Will watch your condition.  Will get help right away if you are not doing well or get worse.   This information is not intended to replace advice given to you by your health care provider. Make sure you discuss any questions you have with your health care provider.   Document Released: 05/10/2005 Document Revised: 09/24/2014 Document Reviewed: 03/02/2013 Elsevier Interactive Patient Education 2016 [employer]  

## 2015-04-15 NOTE — Progress Notes (Signed)
Subjective:  Patient ID: Jessica Woodward, female    DOB: 11/25/1940  Age: 74 y.o. MRN: [id number]  CC: Hyperlipidemia and Hypertension   HPI [name] presents for f/up on HTN and high cholesterol - the nagging pain over her left lower rib cage is unchanged, she takes aleve and the pain resolves.  Outpatient Prescriptions Prior to Visit  Medication Sig Dispense Refill  . aspirin 81 MG tablet Take 81 mg by mouth daily.      . Biotin 5000 MCG CAPS Take 2 capsules by mouth daily.     Marland Kitchen loratadine (CLARITIN) 10 MG tablet Take 10 mg by mouth daily as needed.      . naproxen sodium (ANAPROX) 220 MG tablet Take 220 mg by mouth 2 (two) times daily with a meal.    . OVER THE COUNTER MEDICATION Take by mouth daily. Fiber supplement    . atorvastatin (LIPITOR) 40 MG tablet Take 1 tablet (40 mg total) by mouth daily. 90 tablet 3  . irbesartan-hydrochlorothiazide (AVALIDE) 150-12.5 MG per tablet Take 1 tablet by mouth daily. 90 tablet 3  . traMADol (ULTRAM) 50 MG tablet Take 1 tablet (50 mg total) by mouth every 8 (eight) hours as needed. (Patient not taking: Reported on 04/15/2015) 30 tablet 0   No facility-administered medications prior to visit.    ROS Review of Systems  Constitutional: Negative.  Negative for fever, chills, diaphoresis, appetite change and fatigue.  HENT: Negative.   Eyes: Negative.   Respiratory: Negative.  Negative for cough, choking, chest tightness, shortness of breath, wheezing and stridor.   Cardiovascular: Positive for chest pain. Negative for palpitations and leg swelling.  Gastrointestinal: Negative.  Negative for nausea, abdominal pain, diarrhea, constipation and blood in stool.  Endocrine: Negative.   Genitourinary: Negative.   Musculoskeletal: Negative.  Negative for myalgias, back pain, joint swelling and arthralgias.  Skin: Negative.  Negative for rash.  Allergic/Immunologic: Negative.   Neurological: Negative.  Negative for dizziness, tremors,  seizures, speech difficulty, weakness, light-headedness and headaches.  Hematological: Negative.  Negative for adenopathy. Does not bruise/bleed easily.  Psychiatric/Behavioral: Negative.     Objective:  BP 118/74 mmHg  Pulse 64  Temp(Src) 98.3 F (36.8 C) (Oral)  Resp 20  Ht 5\' 4"  (1.626 m)  Wt 150 lb (68.04 kg)  BMI 25.73 kg/m2  SpO2 98%  BP Readings from Last 3 Encounters:  04/15/15 118/74  02/18/15 110/78  02/06/15 108/76    Wt Readings from Last 3 Encounters:  04/15/15 150 lb (68.04 kg)  02/18/15 153 lb (69.4 kg)  02/06/15 154 lb (69.854 kg)    Physical Exam  Constitutional: She is oriented to person, place, and time. No distress.  HENT:  Mouth/Throat: Oropharynx is clear and moist. No oropharyngeal exudate.  Eyes: Conjunctivae are normal. Right eye exhibits no discharge. Left eye exhibits no discharge. No scleral icterus.  Neck: Normal range of motion. Neck supple. No JVD present. No tracheal deviation present. No thyromegaly present.  Cardiovascular: Normal rate, regular rhythm, normal heart sounds and intact distal pulses.  Exam reveals no gallop and no friction rub.   No murmur heard. Pulmonary/Chest: Effort normal and breath sounds normal. No stridor. No respiratory distress. She has no wheezes. She has no rales. She exhibits no tenderness.  Abdominal: Soft. Bowel sounds are normal. She exhibits no distension and no mass. There is no tenderness. There is no rebound and no guarding.  Musculoskeletal: Normal range of motion. She exhibits no edema or tenderness.  Lymphadenopathy:    She has no cervical adenopathy.  Neurological: She is oriented to person, place, and time.  Skin: Skin is warm and dry. No rash noted. She is not diaphoretic. No erythema. No pallor.  Psychiatric: She has a normal mood and affect. Her behavior is normal. Judgment and thought content normal.  Vitals reviewed.   Lab Results  Component Value Date   WBC 6.6 02/06/2015   HGB 13.4  02/06/2015   HCT 39.2 02/06/2015   PLT 261.0 02/06/2015   GLUCOSE 86 02/06/2015   CHOL 179 04/15/2015   TRIG 91.0 04/15/2015   HDL 60.30 04/15/2015   LDLDIRECT 166.9 03/23/2013   LDLCALC 100* 04/15/2015   ALT 15 03/20/2014   AST 18 03/20/2014   NA 138 02/06/2015   K 4.6 02/06/2015   CL 104 02/06/2015   CREATININE 0.80 02/06/2015   BUN 24* 02/06/2015   CO2 29 02/06/2015   TSH 3.04 04/15/2015   HGBA1C 5.4 01/17/2012    Nm Bone Scan Whole Body  03/07/2015  CLINICAL DATA:  History of left-sided breast cancer. Left chest wall pain. EXAM: NUCLEAR MEDICINE WHOLE BODY BONE SCAN TECHNIQUE: Whole body anterior and posterior images were obtained approximately 3 hours after intravenous injection of radiopharmaceutical. RADIOPHARMACEUTICALS:  24.6 mCi Technetium-46m MDP IV COMPARISON:  No prior bone scans are currently available for comparison. Chest radiograph of February 06, 2015. FINDINGS: Foci of abnormal uptake are noted in both knees and shoulders consistent with degenerative change. No other areas of abnormal uptake are noted. IMPRESSION: No definite evidence of osseous metastases. Electronically Signed   By: Marijo Conception, M.D.   On: 03/07/2015 15:06    Assessment & Plan:   Conception was seen today for hyperlipidemia and hypertension.  Diagnoses and all orders for this visit:  Essential hypertension, benign- her BP is well controlled, lytes and renal function are stable -     irbesartan-hydrochlorothiazide (AVALIDE) 150-12.5 MG tablet; Take 1 tablet by mouth daily.  Hyperlipidemia- she has achieved her LDL goal and is doing well on the statin -     Lipid panel; Future -     TSH; Future -     atorvastatin (LIPITOR) 40 MG tablet; Take 1 tablet (40 mg total) by mouth daily.  Asymptomatic menopausal state- she is due for a DEXA scan -     DG Bone Density; Future  Need for 23-polyvalent pneumococcal polysaccharide vaccine -     Pneumococcal polysaccharide vaccine 23-valent greater  than or equal to 2yo subcutaneous/IM   I have discontinued Ms. Swigert's traMADol. I have also changed her irbesartan-hydrochlorothiazide. Additionally, I am having her maintain her aspirin, loratadine, OVER THE COUNTER MEDICATION, Biotin, naproxen sodium, and atorvastatin.  Meds ordered this encounter  Medications  . irbesartan-hydrochlorothiazide (AVALIDE) 150-12.5 MG tablet    Sig: Take 1 tablet by mouth daily.    Dispense:  90 tablet    Refill:  3  . atorvastatin (LIPITOR) 40 MG tablet    Sig: Take 1 tablet (40 mg total) by mouth daily.    Dispense:  90 tablet    Refill:  3     Follow-up: Return in about 6 months (around 10/13/2015).  Scarlette Calico, MD

## 2015-04-15 NOTE — Progress Notes (Signed)
Pre visit review using our clinic review tool, if applicable. No additional management support is needed unless otherwise documented below in the visit note. 

## 2015-04-18 ENCOUNTER — Encounter: Payer: Self-pay | Admitting: Internal Medicine

## 2015-04-20 ENCOUNTER — Other Ambulatory Visit: Payer: Self-pay | Admitting: Internal Medicine

## 2015-04-22 ENCOUNTER — Ambulatory Visit
Admission: RE | Admit: 2015-04-22 | Discharge: 2015-04-22 | Disposition: A | Discharge status: Home / Self Care | Payer: Medicare Other | Source: Ambulatory Visit | Attending: Oncology | Admitting: Oncology

## 2015-04-22 DIAGNOSIS — R928 Other abnormal and inconclusive findings on diagnostic imaging of breast: Secondary | ICD-10-CM | POA: Diagnosis not present

## 2015-04-22 DIAGNOSIS — Z853 Personal history of malignant neoplasm of breast: Secondary | ICD-10-CM

## 2015-04-22 LAB — HM MAMMOGRAPHY: HM Mammogram: NORMAL

## 2015-05-21 ENCOUNTER — Encounter: Payer: Self-pay | Admitting: Internal Medicine

## 2015-05-21 ENCOUNTER — Other Ambulatory Visit (INDEPENDENT_AMBULATORY_CARE_PROVIDER_SITE_OTHER): Payer: Medicare Other

## 2015-05-21 ENCOUNTER — Ambulatory Visit (INDEPENDENT_AMBULATORY_CARE_PROVIDER_SITE_OTHER): Payer: Medicare Other | Admitting: Internal Medicine

## 2015-05-21 VITALS — BP 112/78 | HR 60 | Temp 98.3°F | Resp 20 | Ht 64.0 in | Wt 153.8 lb

## 2015-05-21 DIAGNOSIS — I10 Essential (primary) hypertension: Secondary | ICD-10-CM

## 2015-05-21 DIAGNOSIS — R10812 Left upper quadrant abdominal tenderness: Secondary | ICD-10-CM

## 2015-05-21 DIAGNOSIS — R0789 Other chest pain: Secondary | ICD-10-CM | POA: Diagnosis not present

## 2015-05-21 LAB — BASIC METABOLIC PANEL
BUN: 27 mg/dL — ABNORMAL HIGH (ref 6–23)
CO2: 26 mEq/L (ref 19–32)
Calcium: 10.1 mg/dL (ref 8.4–10.5)
Chloride: 106 mEq/L (ref 96–112)
Creatinine, Ser: 0.86 mg/dL (ref 0.40–1.20)
GFR: 68.57 mL/min (ref >60.00)
Glucose, Bld: 90 mg/dL (ref 70–99)
Potassium: 4.7 mEq/L (ref 3.5–5.1)
Sodium: 140 mEq/L (ref 135–145)

## 2015-05-21 LAB — CBC WITH DIFFERENTIAL/PLATELET
Basophils Absolute: 0 10*3/uL (ref 0.0–0.1)
Basophils Relative: 0.5 % (ref 0.0–3.0)
Eosinophils Absolute: 0.1 10*3/uL (ref 0.0–0.7)
Eosinophils Relative: 1.8 % (ref 0.0–5.0)
HCT: 39.3 % (ref 36.0–46.0)
Hemoglobin: 13.1 g/dL (ref 12.0–15.0)
Lymphocytes Relative: 27.2 % (ref 12.0–46.0)
Lymphs Abs: 2.1 10*3/uL (ref 0.7–4.0)
MCHC: 33.3 g/dL (ref 30.0–36.0)
MCV: 91.2 fl (ref 78.0–100.0)
Monocytes Absolute: 0.8 10*3/uL (ref 0.1–1.0)
Monocytes Relative: 9.6 % (ref 3.0–12.0)
Neutro Abs: 4.8 10*3/uL (ref 1.4–7.7)
Neutrophils Relative %: 60.9 % (ref 43.0–77.0)
Platelets: 275 10*3/uL (ref 150.0–400.0)
RBC: 4.3 Mil/uL (ref 3.87–5.11)
RDW: 13.9 % (ref 11.5–15.5)
WBC: 7.9 10*3/uL (ref 4.0–10.5)

## 2015-05-21 NOTE — Progress Notes (Signed)
Pre visit review using our clinic review tool, if applicable. No additional management support is needed unless otherwise documented below in the visit note. 

## 2015-05-21 NOTE — Progress Notes (Signed)
Subjective:  Patient ID: Jessica Woodward, female    DOB: 02-Jul-1940  Age: 74 y.o. MRN: [id number]  CC: Abdominal Pain and Chest Pain   HPI Jessica Woodward presents for follow-up on 6 month history of left lower rib cage and left upper abdominal pain. She takes Aleve and it provides symptom relief. She previously had a plain x-ray done and a bone scan were both normal. She describes the pain as a nagging sensation that becomes sharp with movement. She feels like the left lower part of her rib cage is more prominent and appears abnormal which she compares it to the right lower rib cage.  Outpatient Prescriptions Prior to Visit  Medication Sig Dispense Refill  . aspirin 81 MG tablet Take 81 mg by mouth daily.      Marland Kitchen atorvastatin (LIPITOR) 40 MG tablet Take 1 tablet (40 mg total) by mouth daily. 90 tablet 3  . Biotin 5000 MCG CAPS Take 2 capsules by mouth daily.     . irbesartan-hydrochlorothiazide (AVALIDE) 150-12.5 MG tablet Take 1 tablet by mouth daily. 90 tablet 3  . loratadine (CLARITIN) 10 MG tablet Take 10 mg by mouth daily as needed.      . naproxen sodium (ANAPROX) 220 MG tablet Take 220 mg by mouth 2 (two) times daily with a meal.    . OVER THE COUNTER MEDICATION Take by mouth daily. Fiber supplement     No facility-administered medications prior to visit.    ROS Review of Systems  Constitutional: Negative.  Negative for fever, chills, diaphoresis, appetite change and fatigue.  HENT: Negative.   Eyes: Negative.   Respiratory: Negative.  Negative for cough, choking, chest tightness, shortness of breath and stridor.   Cardiovascular: Positive for chest pain. Negative for palpitations and leg swelling.  Gastrointestinal: Positive for abdominal pain. Negative for nausea, vomiting, diarrhea, constipation, blood in stool, abdominal distention, anal bleeding and rectal pain.  Endocrine: Negative.   Genitourinary: Negative.  Negative for dysuria, urgency, frequency, hematuria, flank  pain, decreased urine volume and difficulty urinating.  Musculoskeletal: Negative.  Negative for myalgias, back pain, joint swelling and arthralgias.  Skin: Negative.  Negative for color change and rash.  Allergic/Immunologic: Negative.   Neurological: Negative.  Negative for dizziness and light-headedness.  Hematological: Negative.  Negative for adenopathy. Does not bruise/bleed easily.  Psychiatric/Behavioral: Negative.     Objective:  BP 112/78 mmHg  Pulse 60  Temp(Src) 98.3 F (36.8 C) (Oral)  Resp 20  Ht 5\' 4"  (1.626 m)  Wt 153 lb 12 oz (69.741 kg)  BMI 26.38 kg/m2  SpO2 99%  BP Readings from Last 3 Encounters:  05/21/15 112/78  04/15/15 118/74  02/18/15 110/78    Wt Readings from Last 3 Encounters:  05/21/15 153 lb 12 oz (69.741 kg)  04/15/15 150 lb (68.04 kg)  02/18/15 153 lb (69.4 kg)    Physical Exam  Constitutional: She is oriented to person, place, and time. She appears well-developed and well-nourished.  Non-toxic appearance. She does not have a sickly appearance. She does not appear ill. No distress.  HENT:  Mouth/Throat: Oropharynx is clear and moist. No oropharyngeal exudate.  Eyes: Conjunctivae are normal. Right eye exhibits no discharge. Left eye exhibits no discharge. No scleral icterus.  Neck: Normal range of motion. Neck supple. No JVD present. No tracheal deviation present. No thyromegaly present.  Cardiovascular: Normal rate, regular rhythm and intact distal pulses.  Exam reveals no gallop and no friction rub.   No murmur heard.  Pulmonary/Chest: Effort normal and breath sounds normal. No stridor. No respiratory distress. She has no decreased breath sounds. She has no wheezes. She has no rhonchi. She has no rales. She exhibits bony tenderness and deformity. She exhibits no mass, no tenderness, no crepitus, no edema, no swelling and no retraction.    Abdominal: Soft. Normal appearance and bowel sounds are normal. She exhibits no distension and no mass.  There is no hepatosplenomegaly, splenomegaly or hepatomegaly. There is tenderness in the left upper quadrant. There is no rigidity, no rebound, no guarding, no CVA tenderness, no tenderness at McBurney's point and negative Murphy's sign. No hernia. Hernia confirmed negative in the ventral area, confirmed negative in the right inguinal area and confirmed negative in the left inguinal area.  Musculoskeletal: Normal range of motion. She exhibits no edema or tenderness.  Lymphadenopathy:    She has no cervical adenopathy.  Neurological: She is oriented to person, place, and time.  Skin: Skin is warm and dry. No rash noted. She is not diaphoretic. No erythema. No pallor.  Vitals reviewed.   Lab Results  Component Value Date   WBC 7.9 05/21/2015   HGB 13.1 05/21/2015   HCT 39.3 05/21/2015   PLT 275.0 05/21/2015   GLUCOSE 90 05/21/2015   CHOL 179 04/15/2015   TRIG 91.0 04/15/2015   HDL 60.30 04/15/2015   LDLDIRECT 166.9 03/23/2013   LDLCALC 100* 04/15/2015   ALT 15 03/20/2014   AST 18 03/20/2014   NA 140 05/21/2015   K 4.7 05/21/2015   CL 106 05/21/2015   CREATININE 0.86 05/21/2015   BUN 27* 05/21/2015   CO2 26 05/21/2015   TSH 3.04 04/15/2015   HGBA1C 5.4 01/17/2012    Mm Diag Breast Tomo Bilateral  04/22/2015  CLINICAL DATA:  History of left breast cancer in 2002 status post breast conservation therapy. No current breast problems. EXAM: DIGITAL DIAGNOSTIC BILATERAL MAMMOGRAM WITH 3D TOMOSYNTHESIS AND CAD COMPARISON:  Previous exam(s). ACR Breast Density Category b: There are scattered areas of fibroglandular density. FINDINGS: There are stable postsurgical changes within the left breast. There are no new dominant masses, suspicious calcifications or secondary signs of malignancy identified in either breast. Mammographic images were processed with CAD. IMPRESSION: No mammographic evidence of malignancy within either breast. Stable postsurgical changes within the left breast.  RECOMMENDATION: Screening mammogram in one year.(Code:[id number]-[id number]) I have discussed the findings and recommendations with the patient. Results were also provided in writing at the conclusion of the visit. If applicable, a reminder letter will be sent to the patient regarding the next appointment. BI-RADS CATEGORY  2: Benign. Electronically Signed   By: Franki Cabot M.D.   On: 04/22/2015 10:17    Assessment & Plan:   Sabryn was seen today for abdominal pain and chest pain.  Diagnoses and all orders for this visit:  Left-sided chest wall pain- I've ordered a CT scan of this area to see if there is a mass, lung lesion, or bony lesion. -     CT Chest W Contrast; Future  LUQ abdominal tenderness- she apparently has a divided spleen, will get a CT scan done of this area to see if there is a mass, splenic infarct, splenomegaly or other complication. -     CT Abdomen Pelvis W Contrast; Future -     CBC with Differential/Platelet; Future -     Basic metabolic panel; Future  Essential hypertension, benign- her blood pressure is well-controlled, electrolytes and renal function are stable. -  Basic metabolic panel; Future   I am having Ms. Habenicht maintain her aspirin, loratadine, OVER THE COUNTER MEDICATION, Biotin, naproxen sodium, irbesartan-hydrochlorothiazide, and atorvastatin.  No orders of the defined types were placed in this encounter.     Follow-up: Return in about 3 weeks (around 06/11/2015).  Scarlette Calico, MD

## 2015-05-21 NOTE — Patient Instructions (Signed)

## 2015-05-27 ENCOUNTER — Encounter: Payer: Self-pay | Admitting: Internal Medicine

## 2015-05-27 ENCOUNTER — Ambulatory Visit (INDEPENDENT_AMBULATORY_CARE_PROVIDER_SITE_OTHER)
Admission: RE | Admit: 2015-05-27 | Discharge: 2015-05-27 | Disposition: A | Discharge status: Home / Self Care | Payer: Medicare Other | Source: Ambulatory Visit | Attending: Internal Medicine | Admitting: Internal Medicine

## 2015-05-27 DIAGNOSIS — R0789 Other chest pain: Secondary | ICD-10-CM | POA: Diagnosis not present

## 2015-05-27 DIAGNOSIS — R10812 Left upper quadrant abdominal tenderness: Secondary | ICD-10-CM | POA: Diagnosis not present

## 2015-05-27 DIAGNOSIS — R1902 Left upper quadrant abdominal swelling, mass and lump: Secondary | ICD-10-CM | POA: Diagnosis not present

## 2015-05-27 DIAGNOSIS — R911 Solitary pulmonary nodule: Secondary | ICD-10-CM | POA: Diagnosis not present

## 2015-05-27 MED ORDER — IOHEXOL 300 MG/ML  SOLN
100.0000 mL | Freq: Once | INTRAMUSCULAR | Status: AC | PRN
Start: 2015-05-27 — End: 2015-05-27
  Administered 2015-05-27: 100 mL via INTRAVENOUS

## 2015-06-12 ENCOUNTER — Telehealth: Payer: Self-pay | Admitting: Internal Medicine

## 2015-06-12 ENCOUNTER — Encounter: Payer: Self-pay | Admitting: Internal Medicine

## 2015-06-12 ENCOUNTER — Ambulatory Visit (INDEPENDENT_AMBULATORY_CARE_PROVIDER_SITE_OTHER): Payer: Medicare Other | Admitting: Internal Medicine

## 2015-06-12 VITALS — BP 118/48 | HR 68 | Temp 97.5°F | Resp 16 | Ht 64.0 in | Wt 154.0 lb

## 2015-06-12 DIAGNOSIS — M940 Chondrocostal junction syndrome [Tietze]: Secondary | ICD-10-CM

## 2015-06-12 MED ORDER — NORTRIPTYLINE HCL 10 MG PO CAPS: 10.0000 mg | ORAL_CAPSULE | Freq: Every day | ORAL | Status: DC

## 2015-06-12 NOTE — Patient Instructions (Signed)

## 2015-06-12 NOTE — Telephone Encounter (Signed)
[facility] called stating the prescription for nortriptyline (PAMELOR) 10 MG capsule [id number] should be sent to them, not the mail order

## 2015-06-12 NOTE — Telephone Encounter (Signed)
Done

## 2015-06-12 NOTE — Progress Notes (Signed)
Subjective:  Patient ID: Jessica Woodward, female    DOB: 04-19-1941  Age: 75 y.o. MRN: [id number]  CC: Chest Pain   HPI SHONTA SURATT presents for a follow-up on left lower anterior chest wall pain that has been present for nearly a year now. She complains of pain with specific movements like bending, lifting her gun, and lifting. She takes Aleve for some discomfort but she does complain of discomfort at night time when she is in the bed. The pain does occasionally wake her up. She recently had a CT scan that was unremarkable but it did identify heard discomfort to the costochondral junction.  Outpatient Prescriptions Prior to Visit  Medication Sig Dispense Refill  . aspirin 81 MG tablet Take 81 mg by mouth daily.      Marland Kitchen atorvastatin (LIPITOR) 40 MG tablet Take 1 tablet (40 mg total) by mouth daily. 90 tablet 3  . Biotin 5000 MCG CAPS Take 2 capsules by mouth daily.     . irbesartan-hydrochlorothiazide (AVALIDE) 150-12.5 MG tablet Take 1 tablet by mouth daily. 90 tablet 3  . loratadine (CLARITIN) 10 MG tablet Take 10 mg by mouth daily as needed.      . naproxen sodium (ANAPROX) 220 MG tablet Take 220 mg by mouth 2 (two) times daily with a meal.    . OVER THE COUNTER MEDICATION Take by mouth daily. Fiber supplement     No facility-administered medications prior to visit.    ROS Review of Systems  Constitutional: Negative.  Negative for fever, chills, diaphoresis, appetite change and fatigue.  HENT: Negative.   Eyes: Negative.   Respiratory: Negative.  Negative for cough, choking, chest tightness, shortness of breath and stridor.   Cardiovascular: Positive for chest pain. Negative for palpitations and leg swelling.  Gastrointestinal: Negative.  Negative for vomiting, abdominal pain, diarrhea and constipation.  Endocrine: Negative.   Genitourinary: Negative.   Musculoskeletal: Negative.   Skin: Negative.   Allergic/Immunologic: Negative.   Neurological: Negative.   Hematological:  Negative.  Negative for adenopathy. Does not bruise/bleed easily.  Psychiatric/Behavioral: Negative.     Objective:  BP 118/48 mmHg  Pulse 68  Temp(Src) 97.5 F (36.4 C) (Oral)  Resp 16  Ht 5\' 4"  (1.626 m)  Wt 154 lb (69.854 kg)  BMI 26.42 kg/m2  SpO2 97%  BP Readings from Last 3 Encounters:  06/12/15 118/48  05/21/15 112/78  04/15/15 118/74    Wt Readings from Last 3 Encounters:  06/12/15 154 lb (69.854 kg)  05/21/15 153 lb 12 oz (69.741 kg)  04/15/15 150 lb (68.04 kg)    Physical Exam  Constitutional: She is oriented to person, place, and time. No distress.  HENT:  Mouth/Throat: Oropharynx is clear and moist. No oropharyngeal exudate.  Eyes: Conjunctivae are normal. Right eye exhibits no discharge. Left eye exhibits no discharge. No scleral icterus.  Neck: Normal range of motion. Neck supple. No JVD present. No tracheal deviation present. No thyromegaly present.  Cardiovascular: Normal rate, regular rhythm, normal heart sounds and intact distal pulses.  Exam reveals no gallop and no friction rub.   No murmur heard. Pulmonary/Chest: Effort normal and breath sounds normal. No stridor. No respiratory distress. She has no wheezes. She has no rales. She exhibits no mass, no tenderness, no bony tenderness, no edema, no deformity and no retraction.  Abdominal: Soft. Bowel sounds are normal. She exhibits no distension and no mass. There is no tenderness. There is no rebound and no guarding.  Musculoskeletal: Normal  range of motion. She exhibits no edema or tenderness.  Lymphadenopathy:    She has no cervical adenopathy.  Neurological: She is oriented to person, place, and time.  Skin: Skin is warm and dry. No rash noted. She is not diaphoretic. No erythema. No pallor.    Lab Results  Component Value Date   WBC 7.9 05/21/2015   HGB 13.1 05/21/2015   HCT 39.3 05/21/2015   PLT 275.0 05/21/2015   GLUCOSE 90 05/21/2015   CHOL 179 04/15/2015   TRIG 91.0 04/15/2015   HDL 60.30  04/15/2015   LDLDIRECT 166.9 03/23/2013   LDLCALC 100* 04/15/2015   ALT 15 03/20/2014   AST 18 03/20/2014   NA 140 05/21/2015   K 4.7 05/21/2015   CL 106 05/21/2015   CREATININE 0.86 05/21/2015   BUN 27* 05/21/2015   CO2 26 05/21/2015   TSH 3.04 04/15/2015   HGBA1C 5.4 01/17/2012    Ct Chest W Contrast  05/27/2015  CLINICAL DATA:  LUQ swelling and tenderness for 6 months. Bb placed on area of concern. Hx of Lt breast ca in 2002. No other symptoms. EXAM: CT CHEST, ABDOMEN, AND PELVIS WITH CONTRAST TECHNIQUE: Multidetector CT imaging of the chest, abdomen and pelvis was performed following the standard protocol during bolus administration of intravenous contrast. CONTRAST:  117mL OMNIPAQUE IOHEXOL 300 MG/ML  SOLN COMPARISON:  04/24/2003 FINDINGS: CT CHEST FINDINGS Heart: There is mild pectus excavatum with mild, stable deformity of the heart. No significant coronary artery calcifications. Small pericardial effusion is identified, focally measuring 1.0 cm. Vascular structures:  Aberrant right subclavian artery noted. Mediastinum/thyroid: The visualized portion of the thyroid gland has a normal appearance. No mediastinal, hilar, or axillary adenopathy. Lungs/Airways: There are no focal consolidations or pleural effusions. There is a small nodule at the right lung base measuring 6 mm and associated with linear atelectasis or scarring, favored to be benign based on imaging appearance. An adjacent small pleural-based nodule is 3 mm. Chest wall/osseous: A BB has been placed over the area of patient's concern which corresponds to prominent anterior costochondral junction of the seventh rib. This junction is partially calcified as are others and has no suspicious features. No mass is identified at this location. CT ABDOMEN AND PELVIS FINDINGS Upper abdomen: There are numerous low-attenuation lesions throughout the left and right hepatic lobes. These have circumscribed appearance and are consistent with benign  cysts. No suspicious liver lesions are identified. The largest is identified within the medial segment of the left hepatic lobe measuring 9 mm on image 49 of series 2. The gallbladder is present. No focal abnormality identified within the spleen, pancreas, or adrenal glands. Tiny low-attenuation lesions are identified within the kidneys bilaterally and are consistent with cysts. No hydronephrosis. Gastrointestinal tract: The stomach and small bowel loops are normal in appearance. The appendix is not well seen. Colonic loops are normal in appearance and contain a large amount of stool. Pelvis: Urinary bladder has a normal appearance. Uterus is absent. No adnexal mass. No free pelvic fluid. Retroperitoneum: No significant retroperitoneal adenopathy. There are small lymph nodes in the mesentery, particularly within the right lower quadrant. Although nonspecific, mesenteric adenitis can have this appearance. Large lymph node in the right lower quadrant is 1.3 cm. Abdominal wall: Surgical clips are identified in the lateral aspect of the left breast and left axilla. Numerous coarse calcifications are identified throughout the breasts bilaterally. No abdominal wall hernia. Osseous structures: Mild degenerative changes are identified in the lower lumbar spine. No suspicious  lytic or blastic lesions are identified. IMPRESSION: 1. Area of patient's concern corresponds to a prominent but normal appearing left anterior costochondral junction of the seventh rib. 2. Pectus excavatum, stable in appearance. 3. Trace pericardial effusion. 4. Small right lower lobe nodule, measuring 6 mm in favored to be benign. If the patient is at high risk for bronchogenic carcinoma, follow-up chest CT at 6-12 months is recommended. If the patient is at low risk for bronchogenic carcinoma, follow-up chest CT at 12 months is recommended. This recommendation follows the consensus statement: Guidelines for Management of Small Pulmonary Nodules  Detected on CT Scans: A Statement from the [employer] as published in Radiology 2005;237:395-400. 5. Hepatic cysts. 6. Moderate stool burden. 7. Small, nonspecific mesenteric lymph nodes. Electronically Signed   By: Nolon Nations M.D.   On: 05/27/2015 15:44   Ct Abdomen Pelvis W Contrast  05/27/2015  CLINICAL DATA:  LUQ swelling and tenderness for 6 months. Bb placed on area of concern. Hx of Lt breast ca in 2002. No other symptoms. EXAM: CT CHEST, ABDOMEN, AND PELVIS WITH CONTRAST TECHNIQUE: Multidetector CT imaging of the chest, abdomen and pelvis was performed following the standard protocol during bolus administration of intravenous contrast. CONTRAST:  173mL OMNIPAQUE IOHEXOL 300 MG/ML  SOLN COMPARISON:  04/24/2003 FINDINGS: CT CHEST FINDINGS Heart: There is mild pectus excavatum with mild, stable deformity of the heart. No significant coronary artery calcifications. Small pericardial effusion is identified, focally measuring 1.0 cm. Vascular structures:  Aberrant right subclavian artery noted. Mediastinum/thyroid: The visualized portion of the thyroid gland has a normal appearance. No mediastinal, hilar, or axillary adenopathy. Lungs/Airways: There are no focal consolidations or pleural effusions. There is a small nodule at the right lung base measuring 6 mm and associated with linear atelectasis or scarring, favored to be benign based on imaging appearance. An adjacent small pleural-based nodule is 3 mm. Chest wall/osseous: A BB has been placed over the area of patient's concern which corresponds to prominent anterior costochondral junction of the seventh rib. This junction is partially calcified as are others and has no suspicious features. No mass is identified at this location. CT ABDOMEN AND PELVIS FINDINGS Upper abdomen: There are numerous low-attenuation lesions throughout the left and right hepatic lobes. These have circumscribed appearance and are consistent with benign cysts. No  suspicious liver lesions are identified. The largest is identified within the medial segment of the left hepatic lobe measuring 9 mm on image 49 of series 2. The gallbladder is present. No focal abnormality identified within the spleen, pancreas, or adrenal glands. Tiny low-attenuation lesions are identified within the kidneys bilaterally and are consistent with cysts. No hydronephrosis. Gastrointestinal tract: The stomach and small bowel loops are normal in appearance. The appendix is not well seen. Colonic loops are normal in appearance and contain a large amount of stool. Pelvis: Urinary bladder has a normal appearance. Uterus is absent. No adnexal mass. No free pelvic fluid. Retroperitoneum: No significant retroperitoneal adenopathy. There are small lymph nodes in the mesentery, particularly within the right lower quadrant. Although nonspecific, mesenteric adenitis can have this appearance. Large lymph node in the right lower quadrant is 1.3 cm. Abdominal wall: Surgical clips are identified in the lateral aspect of the left breast and left axilla. Numerous coarse calcifications are identified throughout the breasts bilaterally. No abdominal wall hernia. Osseous structures: Mild degenerative changes are identified in the lower lumbar spine. No suspicious lytic or blastic lesions are identified. IMPRESSION: 1. Area of patient's concern corresponds to  a prominent but normal appearing left anterior costochondral junction of the seventh rib. 2. Pectus excavatum, stable in appearance. 3. Trace pericardial effusion. 4. Small right lower lobe nodule, measuring 6 mm in favored to be benign. If the patient is at high risk for bronchogenic carcinoma, follow-up chest CT at 6-12 months is recommended. If the patient is at low risk for bronchogenic carcinoma, follow-up chest CT at 12 months is recommended. This recommendation follows the consensus statement: Guidelines for Management of Small Pulmonary Nodules Detected on CT  Scans: A Statement from the [employer] as published in Radiology 2005;237:395-400. 5. Hepatic cysts. 6. Moderate stool burden. 7. Small, nonspecific mesenteric lymph nodes. Electronically Signed   By: Nolon Nations M.D.   On: 05/27/2015 15:44    Assessment & Plan:   Normalinda was seen today for chest pain.  Diagnoses and all orders for this visit:  Costochondritis- she will continue Aleve for the discomfort but will also try nortriptyline to see if that helps reduce her pain. She was given patient education material about costochondritis. -     Discontinue: nortriptyline (PAMELOR) 10 MG capsule; Take 1 capsule (10 mg total) by mouth at bedtime.   I am having Ms. Koors maintain her aspirin, loratadine, OVER THE COUNTER MEDICATION, Biotin, naproxen sodium, irbesartan-hydrochlorothiazide, and atorvastatin.  Meds ordered this encounter  Medications  . DISCONTD: nortriptyline (PAMELOR) 10 MG capsule    Sig: Take 1 capsule (10 mg total) by mouth at bedtime.    Dispense:  30 capsule    Refill:  11     Follow-up: Return if symptoms worsen or fail to improve.  Scarlette Calico, MD

## 2015-06-12 NOTE — Progress Notes (Signed)
Pre visit review using our clinic review tool, if applicable. No additional management support is needed unless otherwise documented below in the visit note. 

## 2015-06-19 DIAGNOSIS — D225 Melanocytic nevi of trunk: Secondary | ICD-10-CM | POA: Diagnosis not present

## 2015-06-19 DIAGNOSIS — L821 Other seborrheic keratosis: Secondary | ICD-10-CM | POA: Diagnosis not present

## 2015-06-19 DIAGNOSIS — L814 Other melanin hyperpigmentation: Secondary | ICD-10-CM | POA: Diagnosis not present

## 2015-06-19 DIAGNOSIS — D1801 Hemangioma of skin and subcutaneous tissue: Secondary | ICD-10-CM | POA: Diagnosis not present

## 2015-06-19 DIAGNOSIS — D2262 Melanocytic nevi of left upper limb, including shoulder: Secondary | ICD-10-CM | POA: Diagnosis not present

## 2015-06-19 DIAGNOSIS — Z85828 Personal history of other malignant neoplasm of skin: Secondary | ICD-10-CM | POA: Diagnosis not present

## 2015-06-19 DIAGNOSIS — D2261 Melanocytic nevi of right upper limb, including shoulder: Secondary | ICD-10-CM | POA: Diagnosis not present

## 2015-09-03 DIAGNOSIS — H2513 Age-related nuclear cataract, bilateral: Secondary | ICD-10-CM | POA: Diagnosis not present

## 2015-09-03 DIAGNOSIS — H43811 Vitreous degeneration, right eye: Secondary | ICD-10-CM | POA: Diagnosis not present

## 2015-10-21 ENCOUNTER — Encounter: Payer: Self-pay | Admitting: Adult Health

## 2015-10-21 ENCOUNTER — Ambulatory Visit (INDEPENDENT_AMBULATORY_CARE_PROVIDER_SITE_OTHER): Payer: Medicare Other | Admitting: Adult Health

## 2015-10-21 VITALS — BP 92/60 | HR 82 | Ht 62.5 in | Wt 152.0 lb

## 2015-10-21 DIAGNOSIS — Z124 Encounter for screening for malignant neoplasm of cervix: Secondary | ICD-10-CM

## 2015-10-21 DIAGNOSIS — Z1211 Encounter for screening for malignant neoplasm of colon: Secondary | ICD-10-CM

## 2015-10-21 DIAGNOSIS — Z01419 Encounter for gynecological examination (general) (routine) without abnormal findings: Secondary | ICD-10-CM | POA: Diagnosis not present

## 2015-10-21 DIAGNOSIS — Z853 Personal history of malignant neoplasm of breast: Secondary | ICD-10-CM

## 2015-10-21 LAB — HEMOCCULT GUIAC POC 1CARD (OFFICE): Fecal Occult Blood, POC: NEGATIVE

## 2015-10-21 NOTE — Patient Instructions (Signed)
Physical in 2 years Mammogram yearly Labs with PCP Colonoscopy per GI

## 2015-10-21 NOTE — Progress Notes (Signed)
Patient ID: Jessica Woodward, female   DOB: Jul 26, 1940, 75 y.o.   MRN: [id number] History of Present Illness: Jessica Woodward is a 75 year old white female, married in for a well woman gyn exam, she is sp hysterectomy, and has a history of breast cancer. PCP is Dr Scarlette Calico.  Current Medications, Allergies, Past Medical History, Past Surgical History, Family History and Social History were reviewed in [employer] record.     Review of Systems:  Patient denies any headaches, hearing loss, fatigue, blurred vision, shortness of breath, chest pain,(has arthritis in ribs) abdominal pain, problems with bowel movements, urination, or intercourse(not having sex). No joint pain or mood swings.   Physical Exam:BP 92/60 mmHg  Pulse 82  Ht 5' 2.5" (1.588 m)  Wt 152 lb (68.947 kg)  BMI 27.34 kg/m2 General:  Well developed, well nourished, no acute distress Skin:  Warm and dry, no rashes Neck:  Midline trachea, normal thyroid, good ROM, no lymphadenopathy,no carotid bruits heard Lungs; Clear to auscultation bilaterally Breast:  No dominant palpable mass, retraction, or nipple discharge, had lumpectomy on left and, well healed scar with some retraction Cardiovascular: Regular rate and rhythm Abdomen:  Soft, non tender, no hepatosplenomegaly Pelvic:  External genitalia is normal in appearance, no lesions.  The vagina is atrophic, and pale, Urethra has no lesions or masses. The cervix and uterus are absent.  No adnexal masses or tenderness noted.Bladder is non tender, no masses felt. Rectal: Good sphincter tone, no polyps, or hemorrhoids felt.  Hemoccult negative. Extremities/musculoskeletal:  No swelling or varicosities noted, no clubbing or cyanosis Psych:  No mood changes, alert and cooperative,seems happy   Impression: Well woman gyn exam no pap History of breast cancer    Plan:  Physical in 2 years Labs with PCP Mammogram yearly Colonoscopy per GI

## 2015-11-27 ENCOUNTER — Other Ambulatory Visit: Payer: Self-pay | Admitting: Internal Medicine

## 2015-12-24 NOTE — Progress Notes (Addendum)
Subjective:   Jessica Woodward is a 75 y.o. female who presents for Medicare Annual (Subsequent) preventive examination.  Review of Systems:  HRA assessment completed during this visit with Ms. Prime  The Patient was informed that the wellness visit is to identify future health risk and educate and initiate measures that can reduce risk for increased disease through the lifespan.    NO ROS; Medicare Wellness Visit Last OV:  05/2015 Labs completed: 05/21/2015 (lipids chol 179; Trig 91; hdl 60 and ldl; 100  Glucose 90  Smoker quit 12/1963  History;  Skin cancer; squamous cell 2009 and DCIS in 2002 Had LEFT  lumpectomy; chemo and radiation;  Allergic to Arimidex; (had muscle weakness and developed a rash)  Tamoxifen and Evista but now completed   Time for mammogram; Nov; Dec annually   Psychosocial per note Married; 2 boys; 11 grandchildren; 7 great-grand;   And 4 memory  One has 10 children   Medications reviewed for issues; compliance; otc meds Costochondritis - nortriptyline; did not tolerate and stopped No "acute" pain but takes Nsaid which helps when needed    BMI: 26   Diet;   Breakfast; eggs; milk Lunch; protein; salad  Supper; trying to cut back on starchy carbs BS runs low;  Not suppose to eat sugar; hypoglycemic  does not eat starchy carbs Gets a h/a with sugar;  Food with fiber;  Eats a lot of vegetables     Teeth or Denture issues? Dental care regular  Exercise;   Spouse and her walk 1.5 miles 5 days; ( 76min)  Strength building upper body; Bicep curls; tricepts;   Discussed increasing walk; options include treadmill in the afternoon Or walking with friends   HOME SAFETY reviewed for short term and long term;  Aged in place but thinking about other alternatives Spouse getting someone else to do yard Multi level home; could live on main floor Showers are separate  Moved around a lot;  Living in [place] now; Mother has dementia (24) doing well  there so would most likely stay; loves to camp and travel   Personal safety issues reviewed for risk such as safe community; smoke Secondary school teacher; 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 no Given education on "Fall Prevention in the Home" for more safety tips the patient can apply as appropriate.   Risk for Depression reviewed: Any emotional problems? Anxious, depressed, irritable, sad or blue? No  Denies feeling depressed or hopeless; voices pleasure in daily life How many social activities have you been engaged in within the last 2 weeks? no  Cognitive; no memory issues noted  Manages checkbook, medications; no failures of task Ad8 score reviewed for issues;  Issues making decisions; no  Less interest in hobbies / activities" no  Repeats questions, stories; family complaining: NO  Trouble using ordinary gadgets; microwave; computer: no  Forgets the month or year: no  Mismanaging finances: no  Missing apt: no but does write them down  Daily problems with thinking of memory NO Ad8 score is 0   Advanced Directive addressed;  This has been completed   Counseling Health Maintenance Gaps:  Colonoscopy; 09/2021/ aged out; dr. Olevia Perches stated she did not have to have another one unless [place] EKG: 12/2010 Mammogram: 03/2016 or early Dec  Mammogram due 04/21/2016; Annual mammogram  Dexa/ 03/2015 -1.0 / Dr. Ronnald Ramp ok; Diet has  calcium in it;  Out in the sun enough for Vit d  Hearing: left ear 1000  Right 4000  Has not had hearing test Dr. Claiborne Rigg; hearing test on the left    Ophthalmology exam; in May eye exam Dr. Carolynn Sayers No issues   Immunizations Due: (Vaccines reviewed and educated regarding any overdue)  TD due 12/2019  Established and updated Risk reviewed and appropriate referral made or health recommendations:  Current Care Team reviewed and updated  Cardiac  Risk Factors include: advanced age (>18men, >87 women);dyslipidemia;hypertension;family history of premature cardiovascular disease    Objective:     Vitals: BP 118/80   Pulse 80   Ht 5\' 3"  (1.6 m)   Wt 152 lb 4 oz (69.1 kg)   SpO2 96%   BMI 26.97 kg/m   Body mass index is 26.97 kg/m.   Tobacco History  Smoking Status  . Former Smoker  . Types: Cigarettes  . Quit date: 12/28/1963  Smokeless Tobacco  . Never Used     Counseling given: Yes   Past Medical History:  Diagnosis Date  . Arthritis   . CARCINOMA, SQUAMOUS CELL, HX OF 06/05/2007  . DUCTAL CARCINOMA IN SITU, LEFT BREAST 2002   s/p left lumpectomy, xrt and tamoxifen  . History of chemotherapy   . Hypertension   . RADIATION THERAPY, HX OF   . Seasonal allergies   . Skin cancer    Past Surgical History:  Procedure Laterality Date  . ABDOMINAL HYSTERECTOMY    . athroscopy right knee  2013   torn miniscus  . BREAST LUMPECTOMY Left 2002   had chemo and radiation  . BREAST SURGERY     Left breast lumpectomy with axillary sential node  . DILATION AND CURETTAGE OF UTERUS    . Left knee surgery    . MOHS SURGERY     (R) arm  . right breast lupectomy    . TONSILLECTOMY AND ADENOIDECTOMY    . TUBAL LIGATION     Family History  Problem Relation Age of Onset  . Coronary artery disease Mother   . Dementia Mother   . Macular degeneration Father   . Coronary artery disease Father   . Diabetes Father   . Cancer Brother     pancreatic  . Heart disease Maternal Grandmother   . Kidney disease Maternal Grandmother   . Heart disease Maternal Grandfather   . Heart disease Paternal Grandmother   . Diabetes Paternal Grandmother   . Heart disease Paternal Grandfather   . Hypertension Son    History  Sexual Activity  . Sexual activity: Not Currently  . Partners: Male  . Birth control/ protection: Surgical    Comment: hyst    Outpatient Encounter Prescriptions as of 12/25/2015  Medication Sig  . aspirin 81 MG  tablet Take 81 mg by mouth daily.    Marland Kitchen atorvastatin (LIPITOR) 40 MG tablet Take 1 tablet by mouth  daily  . glucosamine-chondroitin 500-400 MG tablet Take 1 tablet by mouth 2 (two) times daily.  . irbesartan-hydrochlorothiazide (AVALIDE) 150-12.5 MG tablet Take 1 tablet by mouth  daily  . loratadine (CLARITIN) 10 MG tablet Take 10 mg by mouth daily as needed.    . Multiple Vitamin (MULTIVITAMIN) tablet Take 1 tablet by mouth daily.  . naproxen sodium (ANAPROX) 220 MG tablet Take 220 mg by mouth as needed.   Marland Kitchen OVER THE COUNTER MEDICATION Take by mouth daily. Fiber supplement    No facility-administered encounter medications on file  as of 12/25/2015.     Activities of Daily Living In your present state of health, do you have any difficulty performing the following activities: 12/25/2015  Hearing? Y  Vision? N  Difficulty concentrating or making decisions? N  Walking or climbing stairs? N  Dressing or bathing? N  Doing errands, shopping? N  Preparing Food and eating ? N  Using the Toilet? N  In the past six months, have you accidently leaked urine? N  Do you have problems with loss of bowel control? N  Managing your Medications? N  Managing your Finances? N  Housekeeping or managing your Housekeeping? N  Some recent data might be hidden    Patient Care Team: Janith Lima, MD as PCP - General (Internal Medicine) Estill Dooms, NP as PCP - OBGYN (Obstetrics and Gynecology) Chauncey Cruel, MD (Hematology and Oncology) Sydnee Cabal, MD (Orthopedic Surgery) Rutherford Guys, MD (Ophthalmology) Rolm Bookbinder, MD (Dermatology)    Assessment:    Osteopenia; Calcium and Vit d with exercise  Will try to increase walking from 35 min to an add'l 15 minutes on the treadmill;   Hearing loss and spouse sues Dr. Claiborne Rigg and prefers to go there; Will call and make apt to have left hearing checked; Will check for copays   Exercise Activities and Dietary recommendations Current Exercise  Habits: Home exercise routine, Type of exercise: walking, Time (Minutes): 30, Frequency (Times/Week): 5, Weekly Exercise (Minutes/Week): 150, Intensity: Moderate  Goals    . patient          Continue to travel;  Small motor home and will take off       Fall Risk Fall Risk  12/25/2015 12/03/2014 03/20/2014 03/20/2014  Falls in the past year? No No No No   Depression Screen PHQ 2/9 Scores 12/25/2015 12/03/2014 03/20/2014 03/20/2014  PHQ - 2 Score 0 0 0 0     Cognitive Testing MMSE - Mini Mental State Exam 12/25/2015  Not completed: (No Data)   Ad8 score 0   Immunization History  Administered Date(s) Administered  . Influenza Whole 03/17/2009, 02/28/2012  . Influenza, High Dose Seasonal PF 02/17/2015  . Influenza,inj,Quad PF,36+ Mos 02/27/2013, 03/20/2014  . Influenza-Unspecified 02/17/2015  . Pneumococcal Conjugate-13 03/28/2013  . Pneumococcal Polysaccharide-23 08/23/2004, 01/07/2010, 04/15/2015  . Td 07/08/1998, 01/07/2010  . Zoster 03/14/2006   Screening Tests Health Maintenance  Topic Date Due  . INFLUENZA VACCINE  12/23/2015  . MAMMOGRAM  04/21/2017  . TETANUS/TDAP  01/08/2020  . COLONOSCOPY  10/05/2021  . DEXA SCAN  Completed  . ZOSTAVAX  Completed  . PNA vac Low Risk Adult  Completed      Plan:   Will have hearing screen for left ear; <500 hz today Right ear 4000   During the course of the visit the patient was educated and counseled about the following appropriate screening and preventive services:   Vaccines to include Pneumoccal, Influenza, Hepatitis B, Td, Zostavax, HCV  Electrocardiogram   Cardiovascular Disease 12/2010  Colorectal cancer screening due 09/2021 (completed via ba e)   Bone density screening  03/2015- ok; no fup needed   Diabetes screening/ neg  Glaucoma screening; eye exam Dr. Katy Fitch in May; neg   Mammography/03/2015/ due nov or Dec 2017   Nutrition counseling / diet is good and balanced; would like to lose a few pounds and may  try walking 15 minutes more   Patient Instructions (the written plan) was given to the patient.   Wynetta Fines, RN  12/25/2015  Medical screening examination/treatment/procedure(s) were performed by non-physician practitioner and as supervising physician I was immediately available for consultation/collaboration. I agree with above. Scarlette Calico, MD

## 2015-12-25 ENCOUNTER — Ambulatory Visit (INDEPENDENT_AMBULATORY_CARE_PROVIDER_SITE_OTHER): Payer: Medicare Other

## 2015-12-25 VITALS — BP 118/80 | HR 80 | Ht 63.0 in | Wt 152.2 lb

## 2015-12-25 DIAGNOSIS — Z0000 Encounter for general adult medical examination without abnormal findings: Secondary | ICD-10-CM | POA: Diagnosis not present

## 2015-12-25 NOTE — Patient Instructions (Addendum)
Jessica Woodward , Thank you for taking time to come for your Medicare Wellness Visit. I appreciate your ongoing commitment to your health goals. Please review the following plan we discussed and let me know if I can assist you in the future.   Will have hearing screen   [facility] - for hearing aid No reviews  [facility]  [place] [id number]  [phone]    These are the goals we discussed: Goals    . patient          Continue to travel;  Small motor home and will take off        This is a list of the screening recommended for you and due dates:  Health Maintenance  Topic Date Due  . Flu Shot  12/23/2015  . Mammogram  04/21/2017  . Tetanus Vaccine  01/08/2020  . Colon Cancer Screening  10/05/2021  . DEXA scan (bone density measurement)  Completed  . Shingles Vaccine  Completed  . Pneumonia vaccines  Completed     Fall Prevention in the Home  Falls can cause injuries. They can happen to people of all ages. There are many things you can do to make your home safe and to help prevent falls.  WHAT CAN I DO ON THE OUTSIDE OF MY HOME?  Regularly fix the edges of walkways and driveways and fix any cracks.  Remove anything that might make you trip as you walk through a door, such as a raised step or threshold.  Trim any bushes or trees on the path to your home.  Use bright outdoor lighting.  Clear any walking paths of anything that might make someone trip, such as rocks or tools.  Regularly check to see if handrails are loose or broken. Make sure that both sides of any steps have handrails.  Any raised decks and porches should have guardrails on the edges.  Have any leaves, snow, or ice cleared regularly.  Use sand or salt on walking paths during winter.  Clean up any spills in your garage right away. This includes oil or grease spills. WHAT CAN I DO IN THE BATHROOM?   Use night lights.  Install grab bars by the toilet and  in the tub and shower. Do not use towel bars as grab bars.  Use non-skid mats or decals in the tub or shower.  If you need to sit down in the shower, use a plastic, non-slip stool.  Keep the floor dry. Clean up any water that spills on the floor as soon as it happens.  Remove soap buildup in the tub or shower regularly.  Attach bath mats securely with double-sided non-slip rug tape.  Do not have throw rugs and other things on the floor that can make you trip. WHAT CAN I DO IN THE BEDROOM?  Use night lights.  Make sure that you have a light by your bed that is easy to reach.  Do not use any sheets or blankets that are too big for your bed. They should not hang down onto the floor.  Have a firm chair that has side arms. You can use this for support while you get dressed.  Do not have throw rugs and other things on the floor that can make you trip. WHAT CAN I DO IN THE KITCHEN?  Clean up any spills right away.  Avoid walking on wet floors.  Keep items that you use a lot  in easy-to-reach places.  If you need to reach something above you, use a strong step stool that has a grab bar.  Keep electrical cords out of the way.  Do not use floor polish or wax that makes floors slippery. If you must use wax, use non-skid floor wax.  Do not have throw rugs and other things on the floor that can make you trip. WHAT CAN I DO WITH MY STAIRS?  Do not leave any items on the stairs.  Make sure that there are handrails on both sides of the stairs and use them. Fix handrails that are broken or loose. Make sure that handrails are as long as the stairways.  Check any carpeting to make sure that it is firmly attached to the stairs. Fix any carpet that is loose or worn.  Avoid having throw rugs at the top or bottom of the stairs. If you do have throw rugs, attach them to the floor with carpet tape.  Make sure that you have a light switch at the top of the stairs and the bottom of the stairs.  If you do not have them, ask someone to add them for you. WHAT ELSE CAN I DO TO HELP PREVENT FALLS?  Wear shoes that:  Do not have high heels.  Have rubber bottoms.  Are comfortable and fit you well.  Are closed at the toe. Do not wear sandals.  If you use a stepladder:  Make sure that it is fully opened. Do not climb a closed stepladder.  Make sure that both sides of the stepladder are locked into place.  Ask someone to hold it for you, if possible.  Clearly mark and make sure that you can see:  Any grab bars or handrails.  First and last steps.  Where the edge of each step is.  Use tools that help you move around (mobility aids) if they are needed. These include:  Canes.  Walkers.  Scooters.  Crutches.  Turn on the lights when you go into a dark area. Replace any light bulbs as soon as they burn out.  Set up your furniture so you have a clear path. Avoid moving your furniture around.  If any of your floors are uneven, fix them.  If there are any pets around you, be aware of where they are.  Review your medicines with your doctor. Some medicines can make you feel dizzy. This can increase your chance of falling. Ask your doctor what other things that you can do to help prevent falls.   This information is not intended to replace advice given to you by your health care provider. Make sure you discuss any questions you have with your health care provider.   Document Released: 03/06/2009 Document Revised: 09/24/2014 Document Reviewed: 06/14/2014 Elsevier Interactive Patient Education 2016 [place] Maintenance, Female Adopting a healthy lifestyle and getting preventive care can go a long way to promote health and wellness. Talk with your health care provider about what schedule of regular examinations is right for you. This is a good chance for you to check in with your provider about disease prevention and staying healthy. In between checkups, there  are plenty of things you can do on your own. Experts have done a lot of research about which lifestyle changes and preventive measures are most likely to keep you healthy. Ask your health care provider for more information. WEIGHT AND DIET  Eat a healthy diet  Be sure to include plenty of  vegetables, fruits, low-fat dairy products, and lean protein.  Do not eat a lot of foods high in solid fats, added sugars, or salt.  Get regular exercise. This is one of the most important things you can do for your health.  Most adults should exercise for at least 150 minutes each week. The exercise should increase your heart rate and make you sweat (moderate-intensity exercise).  Most adults should also do strengthening exercises at least twice a week. This is in addition to the moderate-intensity exercise.  Maintain a healthy weight  Body mass index (BMI) is a measurement that can be used to identify possible weight problems. It estimates body fat based on height and weight. Your health care provider can help determine your BMI and help you achieve or maintain a healthy weight.  For females 9 years of age and older:   A BMI below 18.5 is considered underweight.  A BMI of 18.5 to 24.9 is normal.  A BMI of 25 to 29.9 is considered overweight.  A BMI of 30 and above is considered obese.  Watch levels of cholesterol and blood lipids  You should start having your blood tested for lipids and cholesterol at 74 years of age, then have this test every 5 years.  You may need to have your cholesterol levels checked more often if:  Your lipid or cholesterol levels are high.  You are older than 74 years of age.  You are at high risk for heart disease.  CANCER SCREENING   Lung Cancer  Lung cancer screening is recommended for adults 26-43 years old who are at high risk for lung cancer because of a history of smoking.  A yearly low-dose CT scan of the lungs is recommended for people  who:  Currently smoke.  Have quit within the past 15 years.  Have at least a 30-pack-year history of smoking. A pack year is smoking an average of one pack of cigarettes a day for 1 year.  Yearly screening should continue until it has been 15 years since you quit.  Yearly screening should stop if you develop a health problem that would prevent you from having lung cancer treatment.  Breast Cancer  Practice breast self-awareness. This means understanding how your breasts normally appear and feel.  It also means doing regular breast self-exams. Let your health care provider know about any changes, no matter how small.  If you are in your 20s or 30s, you should have a clinical breast exam (CBE) by a health care provider every 1-3 years as part of a regular health exam.  If you are 90 or older, have a CBE every year. Also consider having a breast X-ray (mammogram) every year.  If you have a family history of breast cancer, talk to your health care provider about genetic screening.  If you are at high risk for breast cancer, talk to your health care provider about having an MRI and a mammogram every year.  Breast cancer gene (BRCA) assessment is recommended for women who have family members with BRCA-related cancers. BRCA-related cancers include:  Breast.  Ovarian.  Tubal.  Peritoneal cancers.  Results of the assessment will determine the need for genetic counseling and BRCA1 and BRCA2 testing. Cervical Cancer Your health care provider may recommend that you be screened regularly for cancer of the pelvic organs (ovaries, uterus, and vagina). This screening involves a pelvic examination, including checking for microscopic changes to the surface of your cervix (Pap test). You may be encouraged  to have this screening done every 3 years, beginning at age 63.  For women ages 9-65, health care providers may recommend pelvic exams and Pap testing every 3 years, or they may recommend the  Pap and pelvic exam, combined with testing for human papilloma virus (HPV), every 5 years. Some types of HPV increase your risk of cervical cancer. Testing for HPV may also be done on women of any age with unclear Pap test results.  Other health care providers may not recommend any screening for nonpregnant women who are considered low risk for pelvic cancer and who do not have symptoms. Ask your health care provider if a screening pelvic exam is right for you.  If you have had past treatment for cervical cancer or a condition that could lead to cancer, you need Pap tests and screening for cancer for at least 20 years after your treatment. If Pap tests have been discontinued, your risk factors (such as having a new sexual partner) need to be reassessed to determine if screening should resume. Some women have medical problems that increase the chance of getting cervical cancer. In these cases, your health care provider may recommend more frequent screening and Pap tests. Colorectal Cancer  This type of cancer can be detected and often prevented.  Routine colorectal cancer screening usually begins at 74 years of age and continues through 74 years of age.  Your health care provider may recommend screening at an earlier age if you have risk factors for colon cancer.  Your health care provider may also recommend using home test kits to check for hidden blood in the stool.  A small camera at the end of a tube can be used to examine your colon directly (sigmoidoscopy or colonoscopy). This is done to check for the earliest forms of colorectal cancer.  Routine screening usually begins at age 79.  Direct examination of the colon should be repeated every 5-10 years through 74 years of age. However, you may need to be screened more often if early forms of precancerous polyps or small growths are found. Skin Cancer  Check your skin from head to toe regularly.  Tell your health care provider about any new  moles or changes in moles, especially if there is a change in a mole's shape or color.  Also tell your health care provider if you have a mole that is larger than the size of a pencil eraser.  Always use sunscreen. Apply sunscreen liberally and repeatedly throughout the day.  Protect yourself by wearing long sleeves, pants, a wide-brimmed hat, and sunglasses whenever you are outside. HEART DISEASE, DIABETES, AND HIGH BLOOD PRESSURE   High blood pressure causes heart disease and increases the risk of stroke. High blood pressure is more likely to develop in:  People who have blood pressure in the high end of the normal range (130-139/85-89 mm Hg).  People who are overweight or obese.  People who are African American.  If you are 50-51 years of age, have your blood pressure checked every 3-5 years. If you are 82 years of age or older, have your blood pressure checked every year. You should have your blood pressure measured twice--once when you are at a hospital or clinic, and once when you are not at a hospital or clinic. Record the average of the two measurements. To check your blood pressure when you are not at a hospital or clinic, you can use:  An automated blood pressure machine at a pharmacy.  A  home blood pressure monitor.  If you are between 35 years and 11 years old, ask your health care provider if you should take aspirin to prevent strokes.  Have regular diabetes screenings. This involves taking a blood sample to check your fasting blood sugar level.  If you are at a normal weight and have a low risk for diabetes, have this test once every three years after 74 years of age.  If you are overweight and have a high risk for diabetes, consider being tested at a younger age or more often. PREVENTING INFECTION  Hepatitis B  If you have a higher risk for hepatitis B, you should be screened for this virus. You are considered at high risk for hepatitis B if:  You were born in a  country where hepatitis B is common. Ask your health care provider which countries are considered high risk.  Your parents were born in a high-risk country, and you have not been immunized against hepatitis B (hepatitis B vaccine).  You have HIV or AIDS.  You use needles to inject street drugs.  You live with someone who has hepatitis B.  You have had sex with someone who has hepatitis B.  You get hemodialysis treatment.  You take certain medicines for conditions, including cancer, organ transplantation, and autoimmune conditions. Hepatitis C  Blood testing is recommended for:  Everyone born from [birth year] through 1964.  Anyone with known risk factors for hepatitis C. Sexually transmitted infections (STIs)  You should be screened for sexually transmitted infections (STIs) including gonorrhea and chlamydia if:  You are sexually active and are younger than 74 years of age.  You are older than 74 years of age and your health care provider tells you that you are at risk for this type of infection.  Your sexual activity has changed since you were last screened and you are at an increased risk for chlamydia or gonorrhea. Ask your health care provider if you are at risk.  If you do not have HIV, but are at risk, it may be recommended that you take a prescription medicine daily to prevent HIV infection. This is called pre-exposure prophylaxis (PrEP). You are considered at risk if:  You are sexually active and do not regularly use condoms or know the HIV status of your partner(s).  You take drugs by injection.  You are sexually active with a partner who has HIV. Talk with your health care provider about whether you are at high risk of being infected with HIV. If you choose to begin PrEP, you should first be tested for HIV. You should then be tested every 3 months for as long as you are taking PrEP.  PREGNANCY   If you are premenopausal and you may become pregnant, ask your health care  provider about preconception counseling.  If you may become pregnant, take 400 to 800 micrograms (mcg) of folic acid every day.  If you want to prevent pregnancy, talk to your health care provider about birth control (contraception). OSTEOPOROSIS AND MENOPAUSE   Osteoporosis is a disease in which the bones lose minerals and strength with aging. This can result in serious bone fractures. Your risk for osteoporosis can be identified using a bone density scan.  If you are 55 years of age or older, or if you are at risk for osteoporosis and fractures, ask your health care provider if you should be screened.  Ask your health care provider whether you should take a calcium or vitamin D supplement  to lower your risk for osteoporosis.  Menopause may have certain physical symptoms and risks.  Hormone replacement therapy may reduce some of these symptoms and risks. Talk to your health care provider about whether hormone replacement therapy is right for you.  HOME CARE INSTRUCTIONS   Schedule regular health, dental, and eye exams.  Stay current with your immunizations.   Do not use any tobacco products including cigarettes, chewing tobacco, or electronic cigarettes.  If you are pregnant, do not drink alcohol.  If you are breastfeeding, limit how much and how often you drink alcohol.  Limit alcohol intake to no more than 1 drink per day for nonpregnant women. One drink equals 12 ounces of beer, 5 ounces of wine, or 1 ounces of hard liquor.  Do not use street drugs.  Do not share needles.  Ask your health care provider for help if you need support or information about quitting drugs.  Tell your health care provider if you often feel depressed.  Tell your health care provider if you have ever been abused or do not feel safe at home.   This information is not intended to replace advice given to you by your health care provider. Make sure you discuss any questions you have with your health  care provider.   Document Released: 11/23/2010 Document Revised: 05/31/2014 Document Reviewed: 04/11/2013 Elsevier Interactive Patient Education [employer].

## 2016-01-18 ENCOUNTER — Encounter: Payer: Self-pay | Admitting: Internal Medicine

## 2016-01-19 ENCOUNTER — Other Ambulatory Visit: Payer: Self-pay | Admitting: Internal Medicine

## 2016-01-19 DIAGNOSIS — R911 Solitary pulmonary nodule: Secondary | ICD-10-CM | POA: Insufficient documentation

## 2016-01-19 DIAGNOSIS — I10 Essential (primary) hypertension: Secondary | ICD-10-CM

## 2016-01-19 NOTE — Progress Notes (Signed)
Ct Chest W Contrast  Result Date: 05/27/2015 CLINICAL DATA:  LUQ swelling and tenderness for 6 months. Bb placed on area of concern. Hx of Lt breast ca in 2002. No other symptoms. EXAM: CT CHEST, ABDOMEN, AND PELVIS WITH CONTRAST TECHNIQUE: Multidetector CT imaging of the chest, abdomen and pelvis was performed following the standard protocol during bolus administration of intravenous contrast. CONTRAST:  171mL OMNIPAQUE IOHEXOL 300 MG/ML  SOLN COMPARISON:  04/24/2003 FINDINGS: CT CHEST FINDINGS Heart: There is mild pectus excavatum with mild, stable deformity of the heart. No significant coronary artery calcifications. Small pericardial effusion is identified, focally measuring 1.0 cm. Vascular structures:  Aberrant right subclavian artery noted. Mediastinum/thyroid: The visualized portion of the thyroid gland has a normal appearance. No mediastinal, hilar, or axillary adenopathy. Lungs/Airways: There are no focal consolidations or pleural effusions. There is a small nodule at the right lung base measuring 6 mm and associated with linear atelectasis or scarring, favored to be benign based on imaging appearance. An adjacent small pleural-based nodule is 3 mm. Chest wall/osseous: A BB has been placed over the area of patient's concern which corresponds to prominent anterior costochondral junction of the seventh rib. This junction is partially calcified as are others and has no suspicious features. No mass is identified at this location. CT ABDOMEN AND PELVIS FINDINGS Upper abdomen: There are numerous low-attenuation lesions throughout the left and right hepatic lobes. These have circumscribed appearance and are consistent with benign cysts. No suspicious liver lesions are identified. The largest is identified within the medial segment of the left hepatic lobe measuring 9 mm on image 49 of series 2. The gallbladder is present. No focal abnormality identified within the spleen, pancreas, or adrenal glands. Tiny  low-attenuation lesions are identified within the kidneys bilaterally and are consistent with cysts. No hydronephrosis. Gastrointestinal tract: The stomach and small bowel loops are normal in appearance. The appendix is not well seen. Colonic loops are normal in appearance and contain a large amount of stool. Pelvis: Urinary bladder has a normal appearance. Uterus is absent. No adnexal mass. No free pelvic fluid. Retroperitoneum: No significant retroperitoneal adenopathy. There are small lymph nodes in the mesentery, particularly within the right lower quadrant. Although nonspecific, mesenteric adenitis can have this appearance. Large lymph node in the right lower quadrant is 1.3 cm. Abdominal wall: Surgical clips are identified in the lateral aspect of the left breast and left axilla. Numerous coarse calcifications are identified throughout the breasts bilaterally. No abdominal wall hernia. Osseous structures: Mild degenerative changes are identified in the lower lumbar spine. No suspicious lytic or blastic lesions are identified. IMPRESSION: 1. Area of patient's concern corresponds to a prominent but normal appearing left anterior costochondral junction of the seventh rib. 2. Pectus excavatum, stable in appearance. 3. Trace pericardial effusion. 4. Small right lower lobe nodule, measuring 6 mm in favored to be benign. If the patient is at high risk for bronchogenic carcinoma, follow-up chest CT at 6-12 months is recommended. If the patient is at low risk for bronchogenic carcinoma, follow-up chest CT at 12 months is recommended. This recommendation follows the consensus statement: Guidelines for Management of Small Pulmonary Nodules Detected on CT Scans: A Statement from the [place] as published in Radiology 2005;237:395-400. 5. Hepatic cysts. 6. Moderate stool burden. 7. Small, nonspecific mesenteric lymph nodes. Electronically Signed   By: Nolon Nations M.D.   On: 05/27/2015 15:44   Ct Abdomen  Pelvis W Contrast  Result Date: 05/27/2015 CLINICAL DATA:  LUQ swelling  and tenderness for 6 months. Bb placed on area of concern. Hx of Lt breast ca in 2002. No other symptoms. EXAM: CT CHEST, ABDOMEN, AND PELVIS WITH CONTRAST TECHNIQUE: Multidetector CT imaging of the chest, abdomen and pelvis was performed following the standard protocol during bolus administration of intravenous contrast. CONTRAST:  171mL OMNIPAQUE IOHEXOL 300 MG/ML  SOLN COMPARISON:  04/24/2003 FINDINGS: CT CHEST FINDINGS Heart: There is mild pectus excavatum with mild, stable deformity of the heart. No significant coronary artery calcifications. Small pericardial effusion is identified, focally measuring 1.0 cm. Vascular structures:  Aberrant right subclavian artery noted. Mediastinum/thyroid: The visualized portion of the thyroid gland has a normal appearance. No mediastinal, hilar, or axillary adenopathy. Lungs/Airways: There are no focal consolidations or pleural effusions. There is a small nodule at the right lung base measuring 6 mm and associated with linear atelectasis or scarring, favored to be benign based on imaging appearance. An adjacent small pleural-based nodule is 3 mm. Chest wall/osseous: A BB has been placed over the area of patient's concern which corresponds to prominent anterior costochondral junction of the seventh rib. This junction is partially calcified as are others and has no suspicious features. No mass is identified at this location. CT ABDOMEN AND PELVIS FINDINGS Upper abdomen: There are numerous low-attenuation lesions throughout the left and right hepatic lobes. These have circumscribed appearance and are consistent with benign cysts. No suspicious liver lesions are identified. The largest is identified within the medial segment of the left hepatic lobe measuring 9 mm on image 49 of series 2. The gallbladder is present. No focal abnormality identified within the spleen, pancreas, or adrenal glands. Tiny  low-attenuation lesions are identified within the kidneys bilaterally and are consistent with cysts. No hydronephrosis. Gastrointestinal tract: The stomach and small bowel loops are normal in appearance. The appendix is not well seen. Colonic loops are normal in appearance and contain a large amount of stool. Pelvis: Urinary bladder has a normal appearance. Uterus is absent. No adnexal mass. No free pelvic fluid. Retroperitoneum: No significant retroperitoneal adenopathy. There are small lymph nodes in the mesentery, particularly within the right lower quadrant. Although nonspecific, mesenteric adenitis can have this appearance. Large lymph node in the right lower quadrant is 1.3 cm. Abdominal wall: Surgical clips are identified in the lateral aspect of the left breast and left axilla. Numerous coarse calcifications are identified throughout the breasts bilaterally. No abdominal wall hernia. Osseous structures: Mild degenerative changes are identified in the lower lumbar spine. No suspicious lytic or blastic lesions are identified. IMPRESSION: 1. Area of patient's concern corresponds to a prominent but normal appearing left anterior costochondral junction of the seventh rib. 2. Pectus excavatum, stable in appearance. 3. Trace pericardial effusion. 4. Small right lower lobe nodule, measuring 6 mm in favored to be benign. If the patient is at high risk for bronchogenic carcinoma, follow-up chest CT at 6-12 months is recommended. If the patient is at low risk for bronchogenic carcinoma, follow-up chest CT at 12 months is recommended. This recommendation follows the consensus statement: Guidelines for Management of Small Pulmonary Nodules Detected on CT Scans: A Statement from the [place] as published in Radiology 2005;237:395-400. 5. Hepatic cysts. 6. Moderate stool burden. 7. Small, nonspecific mesenteric lymph nodes. Electronically Signed   By: Nolon Nations M.D.   On: 05/27/2015 15:44

## 2016-01-20 ENCOUNTER — Other Ambulatory Visit (INDEPENDENT_AMBULATORY_CARE_PROVIDER_SITE_OTHER): Payer: Medicare Other

## 2016-01-20 DIAGNOSIS — I10 Essential (primary) hypertension: Secondary | ICD-10-CM

## 2016-01-20 LAB — BASIC METABOLIC PANEL
BUN: 25 mg/dL — ABNORMAL HIGH (ref 6–23)
CO2: 28 mEq/L (ref 19–32)
Calcium: 9.6 mg/dL (ref 8.4–10.5)
Chloride: 106 mEq/L (ref 96–112)
Creatinine, Ser: 0.87 mg/dL (ref 0.40–1.20)
GFR: 67.54 mL/min (ref >60.00)
Glucose, Bld: 97 mg/dL (ref 70–99)
Potassium: 4.2 mEq/L (ref 3.5–5.1)
Sodium: 139 mEq/L (ref 135–145)

## 2016-01-21 ENCOUNTER — Encounter: Payer: Self-pay | Admitting: Internal Medicine

## 2016-01-21 ENCOUNTER — Ambulatory Visit (INDEPENDENT_AMBULATORY_CARE_PROVIDER_SITE_OTHER)
Admission: RE | Admit: 2016-01-21 | Discharge: 2016-01-21 | Disposition: A | Discharge status: Home / Self Care | Payer: Medicare Other | Source: Ambulatory Visit | Attending: Internal Medicine | Admitting: Internal Medicine

## 2016-01-21 DIAGNOSIS — R911 Solitary pulmonary nodule: Secondary | ICD-10-CM | POA: Diagnosis not present

## 2016-01-21 MED ORDER — IOPAMIDOL (ISOVUE-300) INJECTION 61%
80.0000 mL | Freq: Once | INTRAVENOUS | Status: AC | PRN
  Administered 2016-01-21: 80 mL via INTRAVENOUS

## 2016-01-22 ENCOUNTER — Other Ambulatory Visit: Payer: Self-pay

## 2016-02-12 ENCOUNTER — Ambulatory Visit (INDEPENDENT_AMBULATORY_CARE_PROVIDER_SITE_OTHER): Payer: Medicare Other

## 2016-02-12 DIAGNOSIS — Z23 Encounter for immunization: Secondary | ICD-10-CM

## 2016-03-12 ENCOUNTER — Other Ambulatory Visit: Payer: Self-pay | Admitting: Internal Medicine

## 2016-03-12 DIAGNOSIS — Z1231 Encounter for screening mammogram for malignant neoplasm of breast: Secondary | ICD-10-CM

## 2016-04-01 ENCOUNTER — Encounter: Payer: Self-pay | Admitting: Internal Medicine

## 2016-04-19 ENCOUNTER — Encounter: Payer: Self-pay | Admitting: Internal Medicine

## 2016-04-19 ENCOUNTER — Other Ambulatory Visit (INDEPENDENT_AMBULATORY_CARE_PROVIDER_SITE_OTHER): Payer: Medicare Other

## 2016-04-19 ENCOUNTER — Ambulatory Visit (INDEPENDENT_AMBULATORY_CARE_PROVIDER_SITE_OTHER): Payer: Medicare Other | Admitting: Internal Medicine

## 2016-04-19 VITALS — BP 118/72 | HR 70 | Temp 98.0°F | Resp 16 | Ht 63.0 in | Wt 153.0 lb

## 2016-04-19 DIAGNOSIS — C50011 Malignant neoplasm of nipple and areola, right female breast: Secondary | ICD-10-CM

## 2016-04-19 DIAGNOSIS — Z0000 Encounter for general adult medical examination without abnormal findings: Secondary | ICD-10-CM

## 2016-04-19 DIAGNOSIS — Z170 Estrogen receptor positive status [ER+]: Secondary | ICD-10-CM | POA: Diagnosis not present

## 2016-04-19 DIAGNOSIS — I10 Essential (primary) hypertension: Secondary | ICD-10-CM

## 2016-04-19 DIAGNOSIS — D0500 Lobular carcinoma in situ of unspecified breast: Secondary | ICD-10-CM

## 2016-04-19 DIAGNOSIS — E7800 Pure hypercholesterolemia, unspecified: Secondary | ICD-10-CM

## 2016-04-19 DIAGNOSIS — R911 Solitary pulmonary nodule: Secondary | ICD-10-CM

## 2016-04-19 LAB — CBC WITH DIFFERENTIAL/PLATELET
Basophils Absolute: 0 10*3/uL (ref 0.0–0.1)
Basophils Relative: 0.6 % (ref 0.0–3.0)
Eosinophils Absolute: 0.1 10*3/uL (ref 0.0–0.7)
Eosinophils Relative: 2.2 % (ref 0.0–5.0)
HCT: 40.2 % (ref 36.0–46.0)
Hemoglobin: 13.5 g/dL (ref 12.0–15.0)
Lymphocytes Relative: 29 % (ref 12.0–46.0)
Lymphs Abs: 1.7 10*3/uL (ref 0.7–4.0)
MCHC: 33.7 g/dL (ref 30.0–36.0)
MCV: 90.2 fl (ref 78.0–100.0)
Monocytes Absolute: 0.5 10*3/uL (ref 0.1–1.0)
Monocytes Relative: 8.3 % (ref 3.0–12.0)
Neutro Abs: 3.6 10*3/uL (ref 1.4–7.7)
Neutrophils Relative %: 59.9 % (ref 43.0–77.0)
Platelets: 253 10*3/uL (ref 150.0–400.0)
RBC: 4.46 Mil/uL (ref 3.87–5.11)
RDW: 13.6 % (ref 11.5–15.5)
WBC: 6 10*3/uL (ref 4.0–10.5)

## 2016-04-19 LAB — COMPREHENSIVE METABOLIC PANEL
ALT: 13 U/L (ref 0–35)
AST: 21 U/L (ref 0–37)
Albumin: 4.3 g/dL (ref 3.5–5.2)
Alkaline Phosphatase: 59 U/L (ref 39–117)
BUN: 19 mg/dL (ref 6–23)
CO2: 23 mEq/L (ref 19–32)
Calcium: 9.8 mg/dL (ref 8.4–10.5)
Chloride: 109 mEq/L (ref 96–112)
Creatinine, Ser: 0.86 mg/dL (ref 0.40–1.20)
GFR: 68.4 mL/min (ref >60.00)
Glucose, Bld: 93 mg/dL (ref 70–99)
Potassium: 4.5 mEq/L (ref 3.5–5.1)
Sodium: 140 mEq/L (ref 135–145)
Total Bilirubin: 0.6 mg/dL (ref 0.2–1.2)
Total Protein: 7 g/dL (ref 6.0–8.3)

## 2016-04-19 LAB — LIPID PANEL
Cholesterol: 186 mg/dL (ref 0–200)
HDL: 73.6 mg/dL (ref >39.00)
LDL Cholesterol: 98 mg/dL (ref 0–99)
NonHDL: 112.59
Total CHOL/HDL Ratio: 3
Triglycerides: 73 mg/dL (ref 0.0–149.0)
VLDL: 14.6 mg/dL (ref 0.0–40.0)

## 2016-04-19 LAB — THYROID PANEL WITH TSH
Free Thyroxine Index: 2.3 (ref 1.4–3.8)
T3 Uptake: 31 % (ref 22–35)
T4, Total: 7.4 ug/dL (ref 4.5–12.0)
TSH: 2.98 mIU/L

## 2016-04-19 MED ORDER — ATORVASTATIN CALCIUM 40 MG PO TABS: 40.0000 mg | ORAL_TABLET | Freq: Every day | ORAL | 3 refills | Status: DC

## 2016-04-19 MED ORDER — IRBESARTAN-HYDROCHLOROTHIAZIDE 150-12.5 MG PO TABS: 1.0000 | ORAL_TABLET | Freq: Every day | ORAL | 1 refills | Status: DC

## 2016-04-19 MED ORDER — RALOXIFENE HCL 60 MG PO TABS: 60.0000 mg | ORAL_TABLET | Freq: Every day | ORAL | 1 refills | Status: DC

## 2016-04-19 NOTE — Patient Instructions (Signed)
Hypertension Hypertension, commonly called high blood pressure, is when the force of blood pumping through your arteries is too strong. Your arteries are the blood vessels that carry blood from your heart throughout your body. A blood pressure reading consists of a higher number over a lower number, such as 110/72. The higher number (systolic) is the pressure inside your arteries when your heart pumps. The lower number (diastolic) is the pressure inside your arteries when your heart relaxes. Ideally you want your blood pressure below 120/80. Hypertension forces your heart to work harder to pump blood. Your arteries may become narrow or stiff. Having untreated or uncontrolled hypertension can cause heart attack, stroke, kidney disease, and other problems. What increases the risk? Some risk factors for high blood pressure are controllable. Others are not. Risk factors you cannot control include:  Race. You may be at higher risk if you are African American.  Age. Risk increases with age.  Gender. Men are at higher risk than women before age 45 years. After age 65, women are at higher risk than men. Risk factors you can control include:  Not getting enough exercise or physical activity.  Being overweight.  Getting too much fat, sugar, calories, or salt in your diet.  Drinking too much alcohol. What are the signs or symptoms? Hypertension does not usually cause signs or symptoms. Extremely high blood pressure (hypertensive crisis) may cause headache, anxiety, shortness of breath, and nosebleed. How is this diagnosed? To check if you have hypertension, your health care provider will measure your blood pressure while you are seated, with your arm held at the level of your heart. It should be measured at least twice using the same arm. Certain conditions can cause a difference in blood pressure between your right and left arms. A blood pressure reading that is higher than normal on one occasion does  not mean that you need treatment. If it is not clear whether you have high blood pressure, you may be asked to return on a different day to have your blood pressure checked again. Or, you may be asked to monitor your blood pressure at home for 1 or more weeks. How is this treated? Treating high blood pressure includes making lifestyle changes and possibly taking medicine. Living a healthy lifestyle can help lower high blood pressure. You may need to change some of your habits. Lifestyle changes may include:  Following the DASH diet. This diet is high in fruits, vegetables, and whole grains. It is low in salt, red meat, and added sugars.  Keep your sodium intake below 2,300 mg per day.  Getting at least 30-45 minutes of aerobic exercise at least 4 times per week.  Losing weight if necessary.  Not smoking.  Limiting alcoholic beverages.  Learning ways to reduce stress. Your health care provider may prescribe medicine if lifestyle changes are not enough to get your blood pressure under control, and if one of the following is true:  You are 18-59 years of age and your systolic blood pressure is above 140.  You are 60 years of age or older, and your systolic blood pressure is above 150.  Your diastolic blood pressure is above 90.  You have diabetes, and your systolic blood pressure is over 140 or your diastolic blood pressure is over 90.  You have kidney disease and your blood pressure is above 140/90.  You have heart disease and your blood pressure is above 140/90. Your personal target blood pressure may vary depending on your medical   conditions, your age, and other factors. Follow these instructions at home:  Have your blood pressure rechecked as directed by your health care provider.  Take medicines only as directed by your health care provider. Follow the directions carefully. Blood pressure medicines must be taken as prescribed. The medicine does not work as well when you skip  doses. Skipping doses also puts you at risk for problems.  Do not smoke.  Monitor your blood pressure at home as directed by your health care provider. Contact a health care provider if:  You think you are having a reaction to medicines taken.  You have recurrent headaches or feel dizzy.  You have swelling in your ankles.  You have trouble with your vision. Get help right away if:  You develop a severe headache or confusion.  You have unusual weakness, numbness, or feel faint.  You have severe chest or abdominal pain.  You vomit repeatedly.  You have trouble breathing. This information is not intended to replace advice given to you by your health care provider. Make sure you discuss any questions you have with your health care provider. Document Released: 05/10/2005 Document Revised: 10/16/2015 Document Reviewed: 03/02/2013 Elsevier Interactive Patient Education  2017 Elsevier Inc.  

## 2016-04-19 NOTE — Progress Notes (Signed)
Pre visit review using our clinic review tool, if applicable. No additional management support is needed unless otherwise documented below in the visit note. 

## 2016-04-19 NOTE — Progress Notes (Signed)
Subjective:  Patient ID: Jessica Woodward, female    DOB: [birth year]  Age: 75 y.o. MRN: [id number]  CC: Hypertension; Hyperlipidemia; and Annual Exam   HPI Jessica Woodward presents for an AWV/CPX.  She is concerned about developing a recurrence of breast cancer and wants to restart Evista. She also wants to have her CA 27.29 level checked.  She tells me her blood pressure has been well controlled on the combination of an ARB and thiazide diuretic. She denies any recent episodes of headache/blurred vision/chest pain/shortness of breath/palpitations/edema/or fatigue.  She is tolerating her statin therapy well with no muscle or joint aches.   Past Medical History:  Diagnosis Date  . Arthritis   . CARCINOMA, SQUAMOUS CELL, HX OF 06/05/2007  . DUCTAL CARCINOMA IN SITU, LEFT BREAST 2002   s/p left lumpectomy, xrt and tamoxifen  . History of chemotherapy   . Hypertension   . RADIATION THERAPY, HX OF   . Seasonal allergies   . Skin cancer    Past Surgical History:  Procedure Laterality Date  . ABDOMINAL HYSTERECTOMY    . athroscopy right knee  2013   torn miniscus  . BREAST LUMPECTOMY Left 2002   had chemo and radiation  . BREAST SURGERY     Left breast lumpectomy with axillary sential node  . DILATION AND CURETTAGE OF UTERUS    . Left knee surgery    . MOHS SURGERY     (R) arm  . right breast lupectomy    . TONSILLECTOMY AND ADENOIDECTOMY    . TUBAL LIGATION      reports that she quit smoking about 52 years ago. Her smoking use included Cigarettes. She has never used smokeless tobacco. She reports that she drinks alcohol. She reports that she does not use drugs. family history includes Cancer in her brother; Coronary artery disease in her father and mother; Dementia in her mother; Diabetes in her father and paternal grandmother; Heart disease in her maternal grandfather, maternal grandmother, paternal grandfather, and paternal grandmother; Hypertension in her son; Kidney disease  in her maternal grandmother; Macular degeneration in her father. Allergies  Allergen Reactions  . Lisinopril     cough  . Sulfonamide Derivatives Hives  . Arimidex [Anastrozole] Other (See Comments)    Causes muscle weakness    Outpatient Medications Prior to Visit  Medication Sig Dispense Refill  . aspirin 81 MG tablet Take 81 mg by mouth daily.      Marland Kitchen loratadine (CLARITIN) 10 MG tablet Take 10 mg by mouth daily as needed.      . naproxen sodium (ANAPROX) 220 MG tablet Take 220 mg by mouth as needed.     Marland Kitchen atorvastatin (LIPITOR) 40 MG tablet Take 1 tablet by mouth  daily 90 tablet 1  . glucosamine-chondroitin 500-400 MG tablet Take 1 tablet by mouth 2 (two) times daily.    . irbesartan-hydrochlorothiazide (AVALIDE) 150-12.5 MG tablet Take 1 tablet by mouth  daily 90 tablet 1  . Multiple Vitamin (MULTIVITAMIN) tablet Take 1 tablet by mouth daily.    Marland Kitchen OVER THE COUNTER MEDICATION Take by mouth daily. Fiber supplement      No facility-administered medications prior to visit.     ROS Review of Systems  Constitutional: Negative for activity change, appetite change, chills, diaphoresis, fatigue, fever and unexpected weight change.  HENT: Negative.   Eyes: Negative.  Negative for visual disturbance.  Respiratory: Negative for cough, choking, chest tightness, shortness of breath and stridor.   Cardiovascular:  Negative for chest pain, palpitations and leg swelling.  Gastrointestinal: Negative.  Negative for abdominal pain, constipation, diarrhea, nausea and vomiting.  Endocrine: Negative.  Negative for polydipsia, polyphagia and polyuria.  Genitourinary: Negative.  Negative for difficulty urinating.  Musculoskeletal: Negative.  Negative for back pain, myalgias and neck pain.  Skin: Negative.  Negative for color change and rash.  Allergic/Immunologic: Negative.   Neurological: Negative.  Negative for dizziness, weakness, light-headedness and numbness.  Hematological: Negative.  Negative  for adenopathy. Does not bruise/bleed easily.  Psychiatric/Behavioral: Negative.     Objective:  BP 118/72 (BP Location: Left Arm, Patient Position: Sitting, Cuff Size: Normal)   Pulse 70   Temp 98 F (36.7 C) (Oral)   Resp 16   Ht 5\' 3"  (1.6 m)   Wt 153 lb (69.4 kg)   SpO2 98%   BMI 27.10 kg/m   BP Readings from Last 3 Encounters:  04/19/16 118/72  12/25/15 118/80  10/21/15 92/60    Wt Readings from Last 3 Encounters:  04/19/16 153 lb (69.4 kg)  12/25/15 152 lb 4 oz (69.1 kg)  10/21/15 152 lb (68.9 kg)    Physical Exam  Constitutional: She is oriented to person, place, and time. She appears well-developed and well-nourished. No distress.  HENT:  Head: Normocephalic and atraumatic.  Mouth/Throat: Oropharynx is clear and moist. No oropharyngeal exudate.  Eyes: Conjunctivae are normal. Right eye exhibits no discharge. Left eye exhibits no discharge. No scleral icterus.  Neck: Normal range of motion. Neck supple. No JVD present. No tracheal deviation present. No thyromegaly present.  Cardiovascular: Normal rate, regular rhythm, normal heart sounds and intact distal pulses.  Exam reveals no gallop and no friction rub.   No murmur heard. Pulmonary/Chest: Effort normal and breath sounds normal. No stridor. No respiratory distress. She has no wheezes. She has no rales. She exhibits no tenderness.  Abdominal: Soft. Bowel sounds are normal. She exhibits no distension and no mass. There is no tenderness. There is no rebound and no guarding.  Musculoskeletal: Normal range of motion. She exhibits no edema, tenderness or deformity.  Lymphadenopathy:    She has no cervical adenopathy.  Neurological: She is oriented to person, place, and time.  Skin: Skin is warm and dry. No rash noted. She is not diaphoretic. No erythema. No pallor.  Vitals reviewed.   Lab Results  Component Value Date   WBC 6.0 04/19/2016   HGB 13.5 04/19/2016   HCT 40.2 04/19/2016   PLT 253.0 04/19/2016    GLUCOSE 93 04/19/2016   CHOL 186 04/19/2016   TRIG 73.0 04/19/2016   HDL 73.60 04/19/2016   LDLDIRECT 166.9 03/23/2013   LDLCALC 98 04/19/2016   ALT 13 04/19/2016   AST 21 04/19/2016   NA 140 04/19/2016   K 4.5 04/19/2016   CL 109 04/19/2016   CREATININE 0.86 04/19/2016   BUN 19 04/19/2016   CO2 23 04/19/2016   TSH 2.98 04/19/2016   HGBA1C 5.4 01/17/2012    Ct Chest W Contrast  Result Date: 01/21/2016 CLINICAL DATA:  Right lung nodule. EXAM: CT CHEST WITH CONTRAST TECHNIQUE: Multidetector CT imaging of the chest was performed during intravenous contrast administration. CONTRAST:  91mL ISOVUE-300 IOPAMIDOL (ISOVUE-300) INJECTION 61% COMPARISON:  CT of the chest/ 3/17. FINDINGS: Cardiovascular: The heart size is normal. The aortic arch and great vessels are within normal limits. An aberrant right subclavian artery is again noted. Mediastinum/Nodes: No significant mediastinal or axillary adenopathy is present. Lungs/Pleura: 2 right lower lobe pulmonary nodules are  again noted on image 110 and 111. Accounting for differences in scan technique, these images are stable measuring 7 mm and 4 mm respectively. There along the pleural surface in likely represent subpleural nodules. No other focal nodule or mass lesion is present. Upper Abdomen: The visualized upper abdomen is within normal limits. Atherosclerotic calcifications are present at the descending aorta without aneurysm. Musculoskeletal: Paraspinous musculature is within normal limits. The ribs are intact. No acute or focal osseous abnormality is present. Bilateral breast calcifications are stable. Surgical clips are present in the left breast. IMPRESSION: 1. Stable appearance of 2 right lower lobe pulmonary nodules. The largest nodule measures 7 mm. Non-contrast chest CT at 6-12 months is recommended. If the nodule is stable at time of repeat CT, then future CT at 18-24 months (from today's scan) is considered optional for low-risk patients, but  is recommended for high-risk patients. This recommendation follows the consensus statement: Guidelines for Management of Incidental Pulmonary Nodules Detected on CT Images:From the [employer] Society 2017; published online before print (10.1148/radiol.SG:5268862). Recommend additional CT of the chest without contrast at 12-18 months according to these consensus guidelines. 2. Atherosclerosis. 3. Postsurgical changes of the left breast. Electronically Signed   By: San Morelle M.D.   On: 01/21/2016 15:01    Assessment & Plan:   Arelly was seen today for hypertension, hyperlipidemia and annual exam.  Diagnoses and all orders for this visit:  Essential hypertension, benign- Her blood pressures adequately well-controlled, electrolytes and renal function are normal. -     Comprehensive metabolic panel; Future -     CBC with Differential/Platelet; Future -     irbesartan-hydrochlorothiazide (AVALIDE) 150-12.5 MG tablet; Take 1 tablet by mouth daily.  Pure hypercholesterolemia- she has achieved her LDL goal is doing well on the statin. -     Lipid panel; Future -     Thyroid Panel With TSH; Future -     atorvastatin (LIPITOR) 40 MG tablet; Take 1 tablet (40 mg total) by mouth daily.  Nodule of right lung- she has a future date follow-up CT scan regarding this.  Lobular carcinoma in situ of breast, unspecified laterality -     raloxifene (EVISTA) 60 MG tablet; Take 1 tablet (60 mg total) by mouth daily.  Malignant neoplasm involving both nipple and areola of right breast in female, estrogen receptor positive (Jessica Woodward)- her cancer antigen level is lower today than it was 3 years ago so I'm not concerned about a recurrence of breast cancer at this time. -     Cancer Antigen 27.29; Future   I have discontinued Ms. Barnaby's OVER THE COUNTER MEDICATION, glucosamine-chondroitin, and multivitamin. I have also changed her atorvastatin and irbesartan-hydrochlorothiazide. Additionally, I am having her  start on raloxifene. Lastly, I am having her maintain her aspirin, loratadine, and naproxen sodium.  Meds ordered this encounter  Medications  . atorvastatin (LIPITOR) 40 MG tablet    Sig: Take 1 tablet (40 mg total) by mouth daily.    Dispense:  90 tablet    Refill:  3  . irbesartan-hydrochlorothiazide (AVALIDE) 150-12.5 MG tablet    Sig: Take 1 tablet by mouth daily.    Dispense:  90 tablet    Refill:  1  . raloxifene (EVISTA) 60 MG tablet    Sig: Take 1 tablet (60 mg total) by mouth daily.    Dispense:  90 tablet    Refill:  1   See AVS for instructions about healthy living and anticipatory guidance.  Follow-up: Return  in about 6 months (around 10/17/2016).  Scarlette Calico, MD

## 2016-04-20 ENCOUNTER — Encounter: Payer: Self-pay | Admitting: Internal Medicine

## 2016-04-20 LAB — CANCER ANTIGEN 27.29: CA 27.29: 40 U/mL — ABNORMAL HIGH (ref <38)

## 2016-04-20 NOTE — Assessment & Plan Note (Signed)

## 2016-04-28 ENCOUNTER — Ambulatory Visit
Admission: RE | Admit: 2016-04-28 | Discharge: 2016-04-28 | Disposition: A | Discharge status: Home / Self Care | Payer: Medicare Other | Source: Ambulatory Visit | Attending: Internal Medicine | Admitting: Internal Medicine

## 2016-04-28 DIAGNOSIS — Z1231 Encounter for screening mammogram for malignant neoplasm of breast: Secondary | ICD-10-CM

## 2016-04-28 LAB — HM MAMMOGRAPHY

## 2016-06-23 ENCOUNTER — Other Ambulatory Visit: Payer: Self-pay | Admitting: Internal Medicine

## 2016-06-23 DIAGNOSIS — D0500 Lobular carcinoma in situ of unspecified breast: Secondary | ICD-10-CM

## 2016-07-12 ENCOUNTER — Other Ambulatory Visit: Payer: Self-pay | Admitting: Internal Medicine

## 2016-07-12 DIAGNOSIS — I10 Essential (primary) hypertension: Secondary | ICD-10-CM

## 2016-08-03 ENCOUNTER — Encounter: Payer: Self-pay | Admitting: Internal Medicine

## 2016-08-03 ENCOUNTER — Other Ambulatory Visit: Payer: Self-pay | Admitting: Internal Medicine

## 2016-08-03 ENCOUNTER — Telehealth: Payer: Self-pay | Admitting: Internal Medicine

## 2016-08-03 DIAGNOSIS — I10 Essential (primary) hypertension: Secondary | ICD-10-CM

## 2016-08-03 DIAGNOSIS — R911 Solitary pulmonary nodule: Secondary | ICD-10-CM

## 2016-08-03 NOTE — Telephone Encounter (Signed)
-----   Message from Janith Lima, MD sent at 01/21/2016  5:03 PM EDT ----- Regarding: CT scan Lung nodule

## 2016-08-04 ENCOUNTER — Encounter: Payer: Self-pay | Admitting: Internal Medicine

## 2016-08-04 ENCOUNTER — Other Ambulatory Visit (INDEPENDENT_AMBULATORY_CARE_PROVIDER_SITE_OTHER): Payer: Medicare Other

## 2016-08-04 DIAGNOSIS — I10 Essential (primary) hypertension: Secondary | ICD-10-CM | POA: Diagnosis not present

## 2016-08-04 LAB — BASIC METABOLIC PANEL
BUN: 26 mg/dL — ABNORMAL HIGH (ref 6–23)
CO2: 25 mEq/L (ref 19–32)
Calcium: 10.2 mg/dL (ref 8.4–10.5)
Chloride: 107 mEq/L (ref 96–112)
Creatinine, Ser: 0.95 mg/dL (ref 0.40–1.20)
GFR: 60.93 mL/min (ref >60.00)
Glucose, Bld: 88 mg/dL (ref 70–99)
Potassium: 4.2 mEq/L (ref 3.5–5.1)
Sodium: 140 mEq/L (ref 135–145)

## 2016-08-18 ENCOUNTER — Encounter: Payer: Self-pay | Admitting: Internal Medicine

## 2016-08-18 ENCOUNTER — Ambulatory Visit (INDEPENDENT_AMBULATORY_CARE_PROVIDER_SITE_OTHER)
Admission: RE | Admit: 2016-08-18 | Discharge: 2016-08-18 | Disposition: A | Discharge status: Home / Self Care | Payer: Medicare Other | Source: Ambulatory Visit | Attending: Internal Medicine | Admitting: Internal Medicine

## 2016-08-18 DIAGNOSIS — R911 Solitary pulmonary nodule: Secondary | ICD-10-CM

## 2016-09-09 DIAGNOSIS — Q833 Accessory nipple: Secondary | ICD-10-CM | POA: Diagnosis not present

## 2016-09-09 DIAGNOSIS — L821 Other seborrheic keratosis: Secondary | ICD-10-CM | POA: Diagnosis not present

## 2016-09-09 DIAGNOSIS — D1801 Hemangioma of skin and subcutaneous tissue: Secondary | ICD-10-CM | POA: Diagnosis not present

## 2016-09-09 DIAGNOSIS — L308 Other specified dermatitis: Secondary | ICD-10-CM | POA: Diagnosis not present

## 2016-09-09 DIAGNOSIS — L700 Acne vulgaris: Secondary | ICD-10-CM | POA: Diagnosis not present

## 2016-09-09 DIAGNOSIS — L814 Other melanin hyperpigmentation: Secondary | ICD-10-CM | POA: Diagnosis not present

## 2016-09-09 DIAGNOSIS — D225 Melanocytic nevi of trunk: Secondary | ICD-10-CM | POA: Diagnosis not present

## 2016-09-09 DIAGNOSIS — Z85828 Personal history of other malignant neoplasm of skin: Secondary | ICD-10-CM | POA: Diagnosis not present

## 2016-09-09 DIAGNOSIS — D2239 Melanocytic nevi of other parts of face: Secondary | ICD-10-CM | POA: Diagnosis not present

## 2016-09-21 ENCOUNTER — Ambulatory Visit (INDEPENDENT_AMBULATORY_CARE_PROVIDER_SITE_OTHER): Payer: Medicare Other | Admitting: Internal Medicine

## 2016-09-21 ENCOUNTER — Other Ambulatory Visit (INDEPENDENT_AMBULATORY_CARE_PROVIDER_SITE_OTHER): Payer: Medicare Other

## 2016-09-21 ENCOUNTER — Encounter: Payer: Self-pay | Admitting: Internal Medicine

## 2016-09-21 VITALS — BP 122/82 | HR 72 | Temp 98.1°F | Resp 16 | Ht 63.0 in | Wt 153.2 lb

## 2016-09-21 DIAGNOSIS — I10 Essential (primary) hypertension: Secondary | ICD-10-CM

## 2016-09-21 LAB — BASIC METABOLIC PANEL
BUN: 23 mg/dL (ref 6–23)
CO2: 27 mEq/L (ref 19–32)
Calcium: 10.1 mg/dL (ref 8.4–10.5)
Chloride: 107 mEq/L (ref 96–112)
Creatinine, Ser: 1.03 mg/dL (ref 0.40–1.20)
GFR: 55.48 mL/min — ABNORMAL LOW (ref >60.00)
Glucose, Bld: 103 mg/dL — ABNORMAL HIGH (ref 70–99)
Potassium: 4.4 mEq/L (ref 3.5–5.1)
Sodium: 142 mEq/L (ref 135–145)

## 2016-09-21 NOTE — Progress Notes (Signed)
Subjective:  Patient ID: Jessica Woodward, female    DOB: [birth year]  Age: 76 y.o. MRN: [id number]  CC: Hypertension   HPI LINLEE CROMIE presents for a BP check - she feels well and offers no complaints.  Outpatient Medications Prior to Visit  Medication Sig Dispense Refill  . aspirin 81 MG tablet Take 81 mg by mouth daily.      Marland Kitchen atorvastatin (LIPITOR) 40 MG tablet Take 1 tablet (40 mg total) by mouth daily. 90 tablet 3  . irbesartan-hydrochlorothiazide (AVALIDE) 150-12.5 MG tablet TAKE 1 TABLET BY MOUTH  DAILY 90 tablet 1  . loratadine (CLARITIN) 10 MG tablet Take 10 mg by mouth daily as needed.      . naproxen sodium (ANAPROX) 220 MG tablet Take 220 mg by mouth as needed.     . raloxifene (EVISTA) 60 MG tablet TAKE 1 TABLET BY MOUTH  DAILY 90 tablet 1   No facility-administered medications prior to visit.     ROS Review of Systems  Constitutional: Negative for appetite change, diaphoresis, fatigue and unexpected weight change.  HENT: Negative.   Eyes: Negative for visual disturbance.  Respiratory: Negative for cough, chest tightness, shortness of breath and wheezing.   Cardiovascular: Negative for chest pain, palpitations and leg swelling.  Gastrointestinal: Negative for abdominal pain, constipation, diarrhea, nausea and vomiting.  Genitourinary: Negative for difficulty urinating.  Musculoskeletal: Negative.  Negative for back pain and myalgias.  Skin: Negative.  Negative for color change and rash.  Neurological: Negative for dizziness, weakness, numbness and headaches.  Hematological: Negative for adenopathy. Does not bruise/bleed easily.  Psychiatric/Behavioral: Negative.     Objective:  BP 122/82 (BP Location: Left Arm, Patient Position: Sitting, Cuff Size: Normal)   Pulse 72   Temp 98.1 F (36.7 C) (Oral)   Resp 16   Ht 5\' 3"  (1.6 m)   Wt 153 lb 4 oz (69.5 kg)   SpO2 98%   BMI 27.15 kg/m   BP Readings from Last 3 Encounters:  09/21/16 122/82  04/19/16 118/72   12/25/15 118/80    Wt Readings from Last 3 Encounters:  09/21/16 153 lb 4 oz (69.5 kg)  04/19/16 153 lb (69.4 kg)  12/25/15 152 lb 4 oz (69.1 kg)    Physical Exam  Constitutional: No distress.  HENT:  Mouth/Throat: Oropharynx is clear and moist. No oropharyngeal exudate.  Eyes: Conjunctivae are normal. Right eye exhibits no discharge. Left eye exhibits no discharge. No scleral icterus.  Neck: Normal range of motion. Neck supple. No JVD present. No tracheal deviation present. No thyromegaly present.  Cardiovascular: Normal rate, regular rhythm, normal heart sounds and intact distal pulses.  Exam reveals no gallop and no friction rub.   No murmur heard. Pulmonary/Chest: Effort normal and breath sounds normal. No stridor. No respiratory distress. She has no wheezes. She has no rales. She exhibits no tenderness.  Abdominal: Soft. Bowel sounds are normal. She exhibits no distension and no mass. There is no tenderness. There is no rebound and no guarding.  Musculoskeletal: Normal range of motion. She exhibits no edema, tenderness or deformity.  Lymphadenopathy:    She has no cervical adenopathy.  Skin: Skin is warm and dry. No rash noted. She is not diaphoretic. No erythema. No pallor.  Vitals reviewed.   Lab Results  Component Value Date   WBC 6.0 04/19/2016   HGB 13.5 04/19/2016   HCT 40.2 04/19/2016   PLT 253.0 04/19/2016   GLUCOSE 88 08/04/2016   CHOL 186  04/19/2016   TRIG 73.0 04/19/2016   HDL 73.60 04/19/2016   LDLDIRECT 166.9 03/23/2013   LDLCALC 98 04/19/2016   ALT 13 04/19/2016   AST 21 04/19/2016   NA 140 08/04/2016   K 4.2 08/04/2016   CL 107 08/04/2016   CREATININE 0.95 08/04/2016   BUN 26 (H) 08/04/2016   CO2 25 08/04/2016   TSH 2.98 04/19/2016   HGBA1C 5.4 01/17/2012    Ct Chest Wo Contrast  Result Date: 08/18/2016 CLINICAL DATA:  Routine follow-up pulmonary nodule EXAM: CT CHEST WITHOUT CONTRAST TECHNIQUE: Multidetector CT imaging of the chest was  performed following the standard protocol without IV contrast. COMPARISON:  CT scan 01/21/2016 FINDINGS: Cardiovascular: Heart size within normal limits. Mild atherosclerotic calcifications of thoracic aorta again noted. Aberrant right subclavian artery with retroesophageal position again noted. Mediastinum/Nodes: No mediastinal hematoma or adenopathy. Central airways are patent. Lungs/Pleura: Images of the lung parenchyma shows no infiltrate or pulmonary edema. Again noted accessory azygos fissure/lobe. No bronchiectasis. No emphysematous changes. Stable mild pleuroparenchymal scarring bilateral apical. Axial image 113 series 3 stable 4.4 mm nodule in right lower lobe anterolaterally. Axial image 114 stable 7 mm subpleural nodule in right middle lobe posterolaterally. No new pulmonary nodules are noted. Upper Abdomen: Visualized upper abdomen shows stable 10 mm cyst in right hepatic lobe anteriorly. No adrenal gland mass is noted in visualized upper abdomen. No calcified gallstones are noted within gallbladder. Mild degenerative changes thoracic spine. Musculoskeletal: No destructive bony lesions are noted. Mild pectus excavatum is again noted. IMPRESSION: 1. Axial image 113 series 3 stable 4.4 mm nodule in right lower lobe anterolaterally. Axial image 114 stable 7 mm subpleural nodule in right middle lobe posterolaterally. Follow-up noncontrast CT scan in 6-12 months is recommended to assure stability if the patient is high risk for bronchogenic carcinoma. This is based on recommendations from initial scan dated 01/21/2016. No new pulmonary nodules are noted. 2. Stable bilateral apical pleuroparenchymal scarring. No infiltrate or pulmonary edema. No adenopathy. Patent central airways. Electronically Signed   By: Lahoma Crocker M.D.   On: 08/18/2016 09:32    Assessment & Plan:   Linnie was seen today for hypertension.  Diagnoses and all orders for this visit:  Essential hypertension, benign- her BP is well  controlled, lytes are normal, renal fxn is stable, will cont the ARB/thiazide combination for BP control  -     Basic metabolic panel; Future   I am having Ms. Aikens maintain her aspirin, loratadine, naproxen sodium, atorvastatin, raloxifene, irbesartan-hydrochlorothiazide, triamcinolone cream, and multivitamin.  Meds ordered this encounter  Medications  . triamcinolone cream (KENALOG) 0.1 %  . Multiple Vitamin (MULTIVITAMIN) tablet    Sig: Take 1 tablet by mouth daily.     Follow-up: No Follow-up on file.  Scarlette Calico, MD

## 2016-09-21 NOTE — Progress Notes (Signed)
Pre visit review using our clinic review tool, if applicable. No additional management support is needed unless otherwise documented below in the visit note. 

## 2016-09-21 NOTE — Patient Instructions (Signed)

## 2016-10-08 DIAGNOSIS — D485 Neoplasm of uncertain behavior of skin: Secondary | ICD-10-CM | POA: Diagnosis not present

## 2016-10-08 DIAGNOSIS — D2239 Melanocytic nevi of other parts of face: Secondary | ICD-10-CM | POA: Diagnosis not present

## 2016-10-08 DIAGNOSIS — L570 Actinic keratosis: Secondary | ICD-10-CM | POA: Diagnosis not present

## 2016-10-08 DIAGNOSIS — Z85828 Personal history of other malignant neoplasm of skin: Secondary | ICD-10-CM | POA: Diagnosis not present

## 2016-11-12 DIAGNOSIS — H43811 Vitreous degeneration, right eye: Secondary | ICD-10-CM | POA: Diagnosis not present

## 2016-11-12 DIAGNOSIS — H2513 Age-related nuclear cataract, bilateral: Secondary | ICD-10-CM | POA: Diagnosis not present

## 2016-12-12 ENCOUNTER — Other Ambulatory Visit: Payer: Self-pay | Admitting: Internal Medicine

## 2016-12-12 DIAGNOSIS — I10 Essential (primary) hypertension: Secondary | ICD-10-CM

## 2016-12-12 DIAGNOSIS — E7800 Pure hypercholesterolemia, unspecified: Secondary | ICD-10-CM

## 2016-12-12 DIAGNOSIS — D0500 Lobular carcinoma in situ of unspecified breast: Secondary | ICD-10-CM

## 2016-12-22 DIAGNOSIS — Z85828 Personal history of other malignant neoplasm of skin: Secondary | ICD-10-CM | POA: Diagnosis not present

## 2016-12-27 DIAGNOSIS — H903 Sensorineural hearing loss, bilateral: Secondary | ICD-10-CM | POA: Insufficient documentation

## 2017-02-01 ENCOUNTER — Ambulatory Visit (INDEPENDENT_AMBULATORY_CARE_PROVIDER_SITE_OTHER): Payer: Medicare Other | Admitting: General Practice

## 2017-02-01 DIAGNOSIS — Z23 Encounter for immunization: Secondary | ICD-10-CM | POA: Diagnosis not present

## 2017-02-14 ENCOUNTER — Encounter: Payer: Self-pay | Admitting: Internal Medicine

## 2017-02-15 ENCOUNTER — Other Ambulatory Visit: Payer: Self-pay | Admitting: Internal Medicine

## 2017-02-15 DIAGNOSIS — J301 Allergic rhinitis due to pollen: Secondary | ICD-10-CM

## 2017-02-15 MED ORDER — METHYLPREDNISOLONE 4 MG PO TBPK: ORAL_TABLET | ORAL | 0 refills | Status: DC

## 2017-02-15 MED ORDER — TRIAMCINOLONE ACETONIDE 55 MCG/ACT NA AERO: 2.0000 | INHALATION_SPRAY | Freq: Every day | NASAL | 12 refills | Status: DC

## 2017-03-14 ENCOUNTER — Other Ambulatory Visit: Payer: Self-pay | Admitting: Internal Medicine

## 2017-03-14 DIAGNOSIS — Z1231 Encounter for screening mammogram for malignant neoplasm of breast: Secondary | ICD-10-CM

## 2017-04-05 ENCOUNTER — Encounter: Payer: Self-pay | Admitting: Internal Medicine

## 2017-04-05 ENCOUNTER — Other Ambulatory Visit (INDEPENDENT_AMBULATORY_CARE_PROVIDER_SITE_OTHER): Payer: Medicare Other

## 2017-04-05 ENCOUNTER — Ambulatory Visit (INDEPENDENT_AMBULATORY_CARE_PROVIDER_SITE_OTHER): Payer: Medicare Other | Admitting: Internal Medicine

## 2017-04-05 VITALS — BP 116/70 | HR 67 | Temp 98.9°F | Resp 16 | Ht 63.0 in | Wt 143.0 lb

## 2017-04-05 DIAGNOSIS — D0500 Lobular carcinoma in situ of unspecified breast: Secondary | ICD-10-CM

## 2017-04-05 DIAGNOSIS — E7800 Pure hypercholesterolemia, unspecified: Secondary | ICD-10-CM

## 2017-04-05 DIAGNOSIS — I10 Essential (primary) hypertension: Secondary | ICD-10-CM

## 2017-04-05 LAB — COMPREHENSIVE METABOLIC PANEL
ALT: 13 U/L (ref 0–35)
AST: 18 U/L (ref 0–37)
Albumin: 4.1 g/dL (ref 3.5–5.2)
Alkaline Phosphatase: 59 U/L (ref 39–117)
BUN: 17 mg/dL (ref 6–23)
CO2: 28 mEq/L (ref 19–32)
Calcium: 9.9 mg/dL (ref 8.4–10.5)
Chloride: 107 mEq/L (ref 96–112)
Creatinine, Ser: 0.94 mg/dL (ref 0.40–1.20)
GFR: 61.57 mL/min (ref >60.00)
Glucose, Bld: 96 mg/dL (ref 70–99)
Potassium: 3.8 mEq/L (ref 3.5–5.1)
Sodium: 143 mEq/L (ref 135–145)
Total Bilirubin: 0.6 mg/dL (ref 0.2–1.2)
Total Protein: 6.7 g/dL (ref 6.0–8.3)

## 2017-04-05 LAB — CBC WITH DIFFERENTIAL/PLATELET
Basophils Absolute: 0.1 10*3/uL (ref 0.0–0.1)
Basophils Relative: 0.9 % (ref 0.0–3.0)
Eosinophils Absolute: 0.2 10*3/uL (ref 0.0–0.7)
Eosinophils Relative: 2.8 % (ref 0.0–5.0)
HCT: 40.3 % (ref 36.0–46.0)
Hemoglobin: 13.5 g/dL (ref 12.0–15.0)
Lymphocytes Relative: 28.5 % (ref 12.0–46.0)
Lymphs Abs: 1.7 10*3/uL (ref 0.7–4.0)
MCHC: 33.5 g/dL (ref 30.0–36.0)
MCV: 93.9 fl (ref 78.0–100.0)
Monocytes Absolute: 0.6 10*3/uL (ref 0.1–1.0)
Monocytes Relative: 10.3 % (ref 3.0–12.0)
Neutro Abs: 3.5 10*3/uL (ref 1.4–7.7)
Neutrophils Relative %: 57.5 % (ref 43.0–77.0)
Platelets: 268 10*3/uL (ref 150.0–400.0)
RBC: 4.29 Mil/uL (ref 3.87–5.11)
RDW: 13.7 % (ref 11.5–15.5)
WBC: 6.1 10*3/uL (ref 4.0–10.5)

## 2017-04-05 LAB — LIPID PANEL
Cholesterol: 147 mg/dL (ref 0–200)
HDL: 58.1 mg/dL (ref >39.00)
LDL Cholesterol: 77 mg/dL (ref 0–99)
NonHDL: 88.68
Total CHOL/HDL Ratio: 3
Triglycerides: 60 mg/dL (ref 0.0–149.0)
VLDL: 12 mg/dL (ref 0.0–40.0)

## 2017-04-05 LAB — TSH: TSH: 3.07 u[IU]/mL (ref 0.35–4.50)

## 2017-04-05 NOTE — Patient Instructions (Signed)

## 2017-04-05 NOTE — Progress Notes (Signed)
Subjective:  Patient ID: Jessica Woodward, female    DOB: 1940-07-15  Age: 76 y.o. MRN: [id number]  CC: Hypertension and Hyperlipidemia   HPI Jessica Woodward presents for f/up - she tells me her blood pressure has been well controlled.  She has had no recent episodes of CP, DOE, palpitations, edema, or fatigue.  She is tolerating the statin well with no muscle or joint aches.  Outpatient Medications Prior to Visit  Medication Sig Dispense Refill  . aspirin 81 MG tablet Take 81 mg by mouth daily.      Marland Kitchen loratadine (CLARITIN) 10 MG tablet Take 10 mg by mouth daily as needed.      . Multiple Vitamin (MULTIVITAMIN) tablet Take 1 tablet by mouth daily.    . naproxen sodium (ANAPROX) 220 MG tablet Take 220 mg by mouth as needed.     . triamcinolone cream (KENALOG) 0.1 %     . atorvastatin (LIPITOR) 40 MG tablet TAKE 1 TABLET BY MOUTH  DAILY 90 tablet 1  . irbesartan-hydrochlorothiazide (AVALIDE) 150-12.5 MG tablet TAKE 1 TABLET BY MOUTH  DAILY 90 tablet 1  . raloxifene (EVISTA) 60 MG tablet TAKE 1 TABLET BY MOUTH  DAILY 90 tablet 1  . triamcinolone (NASACORT) 55 MCG/ACT AERO nasal inhaler Place 2 sprays into the nose daily. 1 Inhaler 12  . methylPREDNISolone (MEDROL DOSEPAK) 4 MG TBPK tablet TAKE AS DIRECTED 21 tablet 0   No facility-administered medications prior to visit.     ROS Review of Systems  Constitutional: Negative.  Negative for diaphoresis, fatigue and unexpected weight change.  HENT: Negative.   Eyes: Negative for visual disturbance.  Respiratory: Negative.  Negative for cough, chest tightness, shortness of breath and wheezing.   Cardiovascular: Negative.  Negative for chest pain, palpitations and leg swelling.  Gastrointestinal: Negative for abdominal pain, constipation, diarrhea, nausea and vomiting.  Endocrine: Negative.   Genitourinary: Negative.  Negative for difficulty urinating and hematuria.  Musculoskeletal: Negative.  Negative for arthralgias, back pain and  myalgias.  Skin: Negative.   Allergic/Immunologic: Negative.   Neurological: Negative.  Negative for dizziness, weakness, light-headedness and headaches.  Hematological: Negative for adenopathy. Does not bruise/bleed easily.  Psychiatric/Behavioral: Negative.     Objective:  BP 116/70 (BP Location: Left Arm, Patient Position: Sitting, Cuff Size: Normal)   Pulse 67   Temp 98.9 F (37.2 C) (Oral)   Resp 16   Ht 5\' 3"  (1.6 m)   Wt 143 lb (64.9 kg)   SpO2 98%   BMI 25.33 kg/m   BP Readings from Last 3 Encounters:  04/05/17 116/70  09/21/16 122/82  04/19/16 118/72    Wt Readings from Last 3 Encounters:  04/05/17 143 lb (64.9 kg)  09/21/16 153 lb 4 oz (69.5 kg)  04/19/16 153 lb (69.4 kg)    Physical Exam  Constitutional: She is oriented to person, place, and time. No distress.  HENT:  Mouth/Throat: Oropharynx is clear and moist. No oropharyngeal exudate.  Eyes: Conjunctivae are normal. Left eye exhibits no discharge. No scleral icterus.  Neck: Neck supple. No JVD present. No thyromegaly present.  Cardiovascular: Normal rate and regular rhythm. Exam reveals no gallop.  No murmur heard. Pulmonary/Chest: Effort normal and breath sounds normal. She has no wheezes. She has no rales.  Abdominal: Soft. Bowel sounds are normal. She exhibits no distension and no mass. There is no tenderness. There is no rebound and no guarding.  Musculoskeletal: Normal range of motion. She exhibits no edema, tenderness or  deformity.  Neurological: She is alert and oriented to person, place, and time.  Skin: Skin is warm and dry. No rash noted. She is not diaphoretic. No erythema. No pallor.  Vitals reviewed.   Lab Results  Component Value Date   WBC 6.1 04/05/2017   HGB 13.5 04/05/2017   HCT 40.3 04/05/2017   PLT 268.0 04/05/2017   GLUCOSE 96 04/05/2017   CHOL 147 04/05/2017   TRIG 60.0 04/05/2017   HDL 58.10 04/05/2017   LDLDIRECT 166.9 03/23/2013   LDLCALC 77 04/05/2017   ALT 13  04/05/2017   AST 18 04/05/2017   NA 143 04/05/2017   K 3.8 04/05/2017   CL 107 04/05/2017   CREATININE 0.94 04/05/2017   BUN 17 04/05/2017   CO2 28 04/05/2017   TSH 3.07 04/05/2017   HGBA1C 5.4 01/17/2012    Ct Chest Wo Contrast  Result Date: 08/18/2016 CLINICAL DATA:  Routine follow-up pulmonary nodule EXAM: CT CHEST WITHOUT CONTRAST TECHNIQUE: Multidetector CT imaging of the chest was performed following the standard protocol without IV contrast. COMPARISON:  CT scan 01/21/2016 FINDINGS: Cardiovascular: Heart size within normal limits. Mild atherosclerotic calcifications of thoracic aorta again noted. Aberrant right subclavian artery with retroesophageal position again noted. Mediastinum/Nodes: No mediastinal hematoma or adenopathy. Central airways are patent. Lungs/Pleura: Images of the lung parenchyma shows no infiltrate or pulmonary edema. Again noted accessory azygos fissure/lobe. No bronchiectasis. No emphysematous changes. Stable mild pleuroparenchymal scarring bilateral apical. Axial image 113 series 3 stable 4.4 mm nodule in right lower lobe anterolaterally. Axial image 114 stable 7 mm subpleural nodule in right middle lobe posterolaterally. No new pulmonary nodules are noted. Upper Abdomen: Visualized upper abdomen shows stable 10 mm cyst in right hepatic lobe anteriorly. No adrenal gland mass is noted in visualized upper abdomen. No calcified gallstones are noted within gallbladder. Mild degenerative changes thoracic spine. Musculoskeletal: No destructive bony lesions are noted. Mild pectus excavatum is again noted. IMPRESSION: 1. Axial image 113 series 3 stable 4.4 mm nodule in right lower lobe anterolaterally. Axial image 114 stable 7 mm subpleural nodule in right middle lobe posterolaterally. Follow-up noncontrast CT scan in 6-12 months is recommended to assure stability if the patient is high risk for bronchogenic carcinoma. This is based on recommendations from initial scan dated  01/21/2016. No new pulmonary nodules are noted. 2. Stable bilateral apical pleuroparenchymal scarring. No infiltrate or pulmonary edema. No adenopathy. Patent central airways. Electronically Signed   By: Lahoma Crocker M.D.   On: 08/18/2016 09:32    Assessment & Plan:   Jessica Woodward was seen today for hypertension and hyperlipidemia.  Diagnoses and all orders for this visit:  Essential hypertension, benign- Her blood pressure is well controlled.  Electrolytes and renal function are normal.  Will continue the combination of an ARB and thiazide diuretic. -     CBC with Differential/Platelet; Future -     Comprehensive metabolic panel; Future -     irbesartan-hydrochlorothiazide (AVALIDE) 150-12.5 MG tablet; Take 1 tablet daily by mouth.  Pure hypercholesterolemia- She has achieved her LDL goal and is doing well on the statin. -     Lipid panel; Future -     TSH; Future -     atorvastatin (LIPITOR) 40 MG tablet; Take 1 tablet (40 mg total) daily by mouth.  Lobular carcinoma in situ (LCIS) of breast -     raloxifene (EVISTA) 60 MG tablet; Take 1 tablet (60 mg total) daily by mouth.   I have discontinued Jessica Woodward. Jessica Woodward triamcinolone and methylPREDNISolone. I have also changed her raloxifene, irbesartan-hydrochlorothiazide, and atorvastatin. Additionally, I am having her maintain her aspirin, loratadine, naproxen sodium, triamcinolone cream, and multivitamin.  Meds ordered this encounter  Medications  . raloxifene (EVISTA) 60 MG tablet    Sig: Take 1 tablet (60 mg total) daily by mouth.    Dispense:  90 tablet    Refill:  1  . irbesartan-hydrochlorothiazide (AVALIDE) 150-12.5 MG tablet    Sig: Take 1 tablet daily by mouth.    Dispense:  90 tablet    Refill:  1  . atorvastatin (LIPITOR) 40 MG tablet    Sig: Take 1 tablet (40 mg total) daily by mouth.    Dispense:  90 tablet    Refill:  1     Follow-up: Return in about 6 months (around 10/03/2017).  Scarlette Calico, MD

## 2017-04-06 MED ORDER — RALOXIFENE HCL 60 MG PO TABS: 60.0000 mg | ORAL_TABLET | Freq: Every day | ORAL | 1 refills | Status: DC

## 2017-04-06 MED ORDER — IRBESARTAN-HYDROCHLOROTHIAZIDE 150-12.5 MG PO TABS: 1.0000 | ORAL_TABLET | Freq: Every day | ORAL | 1 refills | Status: DC

## 2017-04-06 MED ORDER — ATORVASTATIN CALCIUM 40 MG PO TABS: 40.0000 mg | ORAL_TABLET | Freq: Every day | ORAL | 1 refills | Status: DC

## 2017-04-11 ENCOUNTER — Telehealth: Payer: Self-pay | Admitting: Internal Medicine

## 2017-04-11 NOTE — Telephone Encounter (Signed)
Copied from [place] [id number]. Topic: Quick Communication - See Telephone Encounter >> Apr 11, 2017 11:38 AM Gerrie Nordmann wrote: CRM for notification. See Telephone encounter for:   04/11/17.  Spoke with Mrs. Dubberly she stated she will schedule her Medicare Wellness visit along with her 6 month f/u in  2019. Patient stated she will make her appt via Mychart. SF

## 2017-05-02 ENCOUNTER — Ambulatory Visit: Payer: Medicare Other

## 2017-05-06 ENCOUNTER — Ambulatory Visit
Admission: RE | Admit: 2017-05-06 | Discharge: 2017-05-06 | Disposition: A | Discharge status: Home / Self Care | Payer: Medicare Other | Source: Ambulatory Visit | Attending: Internal Medicine | Admitting: Internal Medicine

## 2017-05-06 DIAGNOSIS — Z1231 Encounter for screening mammogram for malignant neoplasm of breast: Secondary | ICD-10-CM

## 2017-05-06 HISTORY — DX: Personal history of antineoplastic chemotherapy: Z92.21

## 2017-05-06 HISTORY — DX: Personal history of irradiation: Z92.3

## 2017-05-06 LAB — HM MAMMOGRAPHY

## 2017-06-15 ENCOUNTER — Other Ambulatory Visit: Payer: Self-pay | Admitting: Internal Medicine

## 2017-06-15 DIAGNOSIS — E7800 Pure hypercholesterolemia, unspecified: Secondary | ICD-10-CM

## 2017-06-15 DIAGNOSIS — I10 Essential (primary) hypertension: Secondary | ICD-10-CM

## 2017-07-14 ENCOUNTER — Encounter: Payer: Self-pay | Admitting: Internal Medicine

## 2017-07-15 ENCOUNTER — Ambulatory Visit (INDEPENDENT_AMBULATORY_CARE_PROVIDER_SITE_OTHER): Payer: Medicare Other | Admitting: Internal Medicine

## 2017-07-15 ENCOUNTER — Encounter: Payer: Self-pay | Admitting: Internal Medicine

## 2017-07-15 VITALS — BP 118/82 | HR 75 | Temp 99.4°F | Ht 63.0 in | Wt 145.0 lb

## 2017-07-15 DIAGNOSIS — R6889 Other general symptoms and signs: Secondary | ICD-10-CM | POA: Diagnosis not present

## 2017-07-15 DIAGNOSIS — J069 Acute upper respiratory infection, unspecified: Secondary | ICD-10-CM | POA: Diagnosis not present

## 2017-07-15 DIAGNOSIS — B9789 Other viral agents as the cause of diseases classified elsewhere: Secondary | ICD-10-CM

## 2017-07-15 LAB — POC INFLUENZA A&B (BINAX/QUICKVUE)
Influenza A, POC: NEGATIVE
Influenza B, POC: NEGATIVE

## 2017-07-15 MED ORDER — HYDROCODONE-HOMATROPINE 5-1.5 MG/5ML PO SYRP: 5.0000 mL | ORAL_SOLUTION | Freq: Every evening | ORAL | 0 refills | Status: DC | PRN

## 2017-07-15 NOTE — Patient Instructions (Addendum)
Your flu test is negative.   We will have you start taking the claritin again to help the drainage.   It is okay to take the mucinex and tylenol for the pain.   We have sent in cough medicine to use at night time for the cough.

## 2017-07-15 NOTE — Progress Notes (Signed)
   Subjective:    Patient ID: Jessica Woodward, female    DOB: [birth year], 77 y.o.   MRN: [id number]  HPI The patient is a 77 YO female coming in for cough and headaches and body aches. Denies fevers or chills. Denies known sick contacts but has been grocery shopping. She is not taking claritin now. She has tried tylenol for headaches and mucinex cough medicine and benadryl and they have helped some. She did get flu shot this year. Symptoms started Wednesday. Overall worsening since onset.   Review of Systems  Constitutional: Positive for activity change, appetite change and chills. Negative for fatigue, fever and unexpected weight change.  HENT: Positive for congestion, postnasal drip, rhinorrhea and sinus pressure. Negative for ear discharge, ear pain, sinus pain, sneezing, sore throat, tinnitus, trouble swallowing and voice change.   Eyes: Negative.   Respiratory: Positive for cough. Negative for chest tightness, shortness of breath and wheezing.   Cardiovascular: Negative.   Gastrointestinal: Negative.   Musculoskeletal: Positive for myalgias.  Neurological: Negative.       Objective:   Physical Exam  Constitutional: She is oriented to person, place, and time. She appears well-developed and well-nourished.  HENT:  Head: Normocephalic and atraumatic.  Oropharynx with redness and clear drainage, nose with swollen turbinates, TMs normal bilaterally  Eyes: EOM are normal.  Neck: Normal range of motion. No thyromegaly present.  Cardiovascular: Normal rate and regular rhythm.  Pulmonary/Chest: Effort normal and breath sounds normal. No respiratory distress. She has no wheezes. She has no rales.  Abdominal: Soft.  Musculoskeletal: She exhibits tenderness.  Lymphadenopathy:    She has no cervical adenopathy.  Neurological: She is alert and oriented to person, place, and time.  Skin: Skin is warm and dry.   Vitals:   07/15/17 1537  BP: 118/82  Pulse: 75  Temp: 99.4 F (37.4 C)    TempSrc: Oral  SpO2: 99%  Weight: 145 lb (65.8 kg)  Height: 5\' 3"  (1.6 m)      Assessment & Plan:

## 2017-07-15 NOTE — Assessment & Plan Note (Signed)
Rx for hycodan for cough. No indication for antibiotics or steroids. Flu test negative in the office so no tamiflu as well edge of limits. [place] narcotic database reviewed and no fills last 2 years.

## 2017-07-18 NOTE — Progress Notes (Signed)
This encounter was created in error - please disregard.

## 2017-08-18 ENCOUNTER — Encounter: Payer: Self-pay | Admitting: Internal Medicine

## 2017-08-18 ENCOUNTER — Ambulatory Visit (INDEPENDENT_AMBULATORY_CARE_PROVIDER_SITE_OTHER): Payer: Medicare Other | Admitting: Internal Medicine

## 2017-08-18 DIAGNOSIS — M79674 Pain in right toe(s): Secondary | ICD-10-CM | POA: Insufficient documentation

## 2017-08-18 NOTE — Progress Notes (Signed)
   Subjective:    Patient ID: Jessica Woodward, female    DOB: 1940-11-04, 77 y.o.   MRN: [id number]  HPI The patient is a 77 YO female coming in for right great toe pain and swelling and redness. Started about 1-2 months ago. Looks like the nail is bruised underneath but she does not recall a specific incident or injury. She denies swelling. Hurts worse when she is walking for a long time. Rare aleve usage for pain. Denies fevers or chills. Rates pain 1-2/10. Stable since onset.   Review of Systems  Constitutional: Negative.   Respiratory: Negative for cough, chest tightness and shortness of breath.   Cardiovascular: Negative for chest pain, palpitations and leg swelling.  Gastrointestinal: Negative for abdominal distention, abdominal pain, constipation, diarrhea, nausea and vomiting.  Musculoskeletal: Positive for myalgias.  Skin: Negative.   Neurological: Negative.   Psychiatric/Behavioral: Negative.       Objective:   Physical Exam  Constitutional: She is oriented to person, place, and time. She appears well-developed and well-nourished.  HENT:  Head: Normocephalic and atraumatic.  Eyes: EOM are normal.  Neck: Normal range of motion.  Cardiovascular: Normal rate and regular rhythm.  Pulmonary/Chest: Effort normal and breath sounds normal. No respiratory distress. She has no wheezes. She has no rales.  Abdominal: Soft.  Musculoskeletal: She exhibits no edema.  Neurological: She is alert and oriented to person, place, and time. Coordination normal.  Skin: Skin is warm and dry.  No redness appreciated on the right great toe, some bruising along the medial aspect of the right great nail with some loosening along the edge of nail, no fungal changes, no pain in the foot or toe joints.   Psychiatric: She has a normal mood and affect.   Vitals:   08/18/17 0840  BP: 110/72  Pulse: 68  Temp: 98.1 F (36.7 C)  TempSrc: Oral  SpO2: 98%  Weight: 147 lb (66.7 kg)  Height: 5\' 3"  (1.6 m)        Assessment & Plan:

## 2017-08-18 NOTE — Patient Instructions (Signed)
You can keep the toe bandaged to help reduce the pain.

## 2017-08-18 NOTE — Assessment & Plan Note (Addendum)
Suspect bruising from injury or microtrauma. No indication of fungal infection, gout, fracture. Can use aleve for pain if needed. Can bandage to help cushion while walking. Advised to wear closed toe shoes for protection at all times to prevent injury. No indication for x-ray today. Informed that it could take 1 year for toenail to grow out.

## 2017-08-31 ENCOUNTER — Other Ambulatory Visit: Payer: Self-pay | Admitting: Internal Medicine

## 2017-08-31 ENCOUNTER — Telehealth: Payer: Self-pay | Admitting: Internal Medicine

## 2017-08-31 DIAGNOSIS — R911 Solitary pulmonary nodule: Secondary | ICD-10-CM

## 2017-08-31 DIAGNOSIS — R918 Other nonspecific abnormal finding of lung field: Secondary | ICD-10-CM

## 2017-08-31 NOTE — Telephone Encounter (Signed)
-----   Message from Janith Lima, MD sent at 08/23/2016 11:23 AM EDT ----- Regarding: recheck lung nodule Repeat CT without contrast

## 2017-09-15 ENCOUNTER — Ambulatory Visit (INDEPENDENT_AMBULATORY_CARE_PROVIDER_SITE_OTHER)
Admission: RE | Admit: 2017-09-15 | Discharge: 2017-09-15 | Disposition: A | Discharge status: Home / Self Care | Payer: Medicare Other | Source: Ambulatory Visit | Attending: Internal Medicine | Admitting: Internal Medicine

## 2017-09-15 DIAGNOSIS — R918 Other nonspecific abnormal finding of lung field: Secondary | ICD-10-CM | POA: Diagnosis not present

## 2017-09-15 DIAGNOSIS — R911 Solitary pulmonary nodule: Secondary | ICD-10-CM

## 2017-09-16 ENCOUNTER — Encounter: Payer: Self-pay | Admitting: Internal Medicine

## 2017-10-04 ENCOUNTER — Ambulatory Visit: Payer: Medicare Other

## 2017-10-04 NOTE — Progress Notes (Addendum)
Subjective:   Jessica Woodward is a 77 y.o. female who presents for Medicare Annual (Subsequent) preventive examination.  Review of Systems:  No ROS.  Medicare Wellness Visit. Additional risk factors are reflected in the social history.  Cardiac Risk Factors include: advanced age (>15men, >19 women);dyslipidemia;hypertension Sleep patterns: no sleep issues, feels rested on waking, gets up 1 times nightly to void and sleeps 8-9 hours nightly.    Home Safety/Smoke Alarms: Feels safe in home. Smoke alarms in place.  Living environment; residence and Firearm Safety: 2-story house, no firearms. Lives with husband, no needs for DME, good support system Seat Belt Safety/Bike Helmet: Wears seat belt.     Objective:     Vitals: BP 138/72   Pulse 61   Resp 18   Ht 5\' 3"  (1.6 m)   Wt 143 lb (64.9 kg)   SpO2 99%   BMI 25.33 kg/m   Body mass index is 25.33 kg/m.  Advanced Directives 10/05/2017 04/20/2016  Does Patient Have a Medical Advance Directive? Yes No;Yes  Type of Paramedic of [place];Living will [place];Living will  Does patient want to make changes to medical advance directive? - Yes (Inpatient - patient requests chaplain consult to change a medical advance directive)  Copy of [place] in Chart? No - copy requested Yes    Tobacco Social History   Tobacco Use  Smoking Status Former Smoker  . Types: Cigarettes  . Last attempt to quit: 12/28/1963  . Years since quitting: 53.8  Smokeless Tobacco Never Used     Counseling given: Not Answered  Past Medical History:  Diagnosis Date  . Arthritis   . CARCINOMA, SQUAMOUS CELL, HX OF 06/05/2007  . DUCTAL CARCINOMA IN SITU, LEFT BREAST 2002   s/p left lumpectomy, xrt and tamoxifen  . History of chemotherapy   . Hypertension   . Personal history of chemotherapy 2002  . Personal history of radiation therapy 2002  . RADIATION THERAPY, HX OF   . Seasonal allergies    . Skin cancer    Past Surgical History:  Procedure Laterality Date  . ABDOMINAL HYSTERECTOMY    . athroscopy right knee  2013   torn miniscus  . BREAST BIOPSY Left 2002  . BREAST EXCISIONAL BIOPSY Right   . BREAST LUMPECTOMY Left 2002   had chemo and radiation  . BREAST SURGERY     Left breast lumpectomy with axillary sential node  . DILATION AND CURETTAGE OF UTERUS    . Left knee surgery    . MOHS SURGERY     (R) arm  . right breast lupectomy    . TONSILLECTOMY AND ADENOIDECTOMY    . TUBAL LIGATION     Family History  Problem Relation Age of Onset  . Coronary artery disease Mother   . Dementia Mother   . Macular degeneration Father   . Coronary artery disease Father   . Diabetes Father   . Cancer Brother        pancreatic  . Heart disease Maternal Grandmother   . Kidney disease Maternal Grandmother   . Heart disease Maternal Grandfather   . Heart disease Paternal Grandmother   . Diabetes Paternal Grandmother   . Heart disease Paternal Grandfather   . Hypertension Son    Social History   Socioeconomic History  . Marital status: Married    Spouse name: Not on file  . Number of children: 2  . Years of education: Not  on file  . Highest education level: Not on file  Occupational History  . Not on file  Social Needs  . Financial resource strain: Not hard at all  . Food insecurity:    Worry: Never true    Inability: Never true  . Transportation needs:    Medical: No    Non-medical: No  Tobacco Use  . Smoking status: Former Smoker    Types: Cigarettes    Last attempt to quit: 12/28/1963    Years since quitting: 53.8  . Smokeless tobacco: Never Used  Substance and Sexual Activity  . Alcohol use: Yes    Comment: rare glass of Carlson Belland  . Drug use: No  . Sexual activity: Not Currently    Partners: Male    Birth control/protection: Surgical    Comment: hyst  Lifestyle  . Physical activity:    Days per week: 4 days    Minutes per session: 60 min  . Stress:  Only a little  Relationships  . Social connections:    Talks on phone: More than three times a week    Gets together: More than three times a week    Attends religious service: More than 4 times per year    Active member of club or organization: Not on file    Attends meetings of clubs or organizations: More than 4 times per year    Relationship status: Married  Other Topics Concern  . Not on file  Social History Narrative   HSG, [employer] college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 married '60. 2 boys - '62, '73; 11 grandchildren; 3 great-grandchildren. retired, marriage in good health.    Outpatient Encounter Medications as of 10/05/2017  Medication Sig  . aspirin 81 MG tablet Take 81 mg by mouth daily.    Marland Kitchen atorvastatin (LIPITOR) 40 MG tablet Take 1 tablet (40 mg total) by mouth daily.  . irbesartan-hydrochlorothiazide (AVALIDE) 150-12.5 MG tablet Take 1 tablet by mouth daily.  Marland Kitchen loratadine (CLARITIN) 10 MG tablet Take 10 mg by mouth daily as needed.    . Multiple Vitamin (MULTIVITAMIN) tablet Take 1 tablet by mouth daily.  . naproxen sodium (ANAPROX) 220 MG tablet Take 220 mg by mouth as needed.   . raloxifene (EVISTA) 60 MG tablet Take 1 tablet (60 mg total) by mouth daily.  Marland Kitchen triamcinolone cream (KENALOG) 0.1 %   . [DISCONTINUED] atorvastatin (LIPITOR) 40 MG tablet TAKE 1 TABLET DAILY BY  MOUTH.  . [DISCONTINUED] irbesartan-hydrochlorothiazide (AVALIDE) 150-12.5 MG tablet TAKE 1 TABLET DAILY BY  MOUTH.  . [DISCONTINUED] raloxifene (EVISTA) 60 MG tablet Take 1 tablet (60 mg total) daily by mouth.  . [DISCONTINUED] HYDROcodone-homatropine (HYCODAN) 5-1.5 MG/5ML syrup Take 5-10 mLs by mouth at bedtime as needed for cough. (Patient not taking: Reported on 08/18/2017)   No facility-administered encounter medications on file as of 10/05/2017.     Activities of Daily Living In your present state of health, do you have any difficulty performing the following activities: 10/05/2017  Hearing? N    Vision? N  Difficulty concentrating or making decisions? N  Walking or climbing stairs? N  Dressing or bathing? N  Doing errands, shopping? N  Preparing Food and eating ? N  Using the Toilet? N  In the past six months, have you accidently leaked urine? N  Do you have problems with loss of bowel control? N  Managing your Medications? N  Managing your Finances? N  Housekeeping or managing your Housekeeping? N  Some recent data  might be hidden    Patient Care Team: Janith Lima, MD as PCP - General (Internal Medicine) Estill Dooms, NP as PCP - OBGYN (Obstetrics and Gynecology) Magrinat, Virgie Dad, MD (Hematology and Oncology) Sydnee Cabal, MD (Orthopedic Surgery) Rutherford Guys, MD (Ophthalmology) Rolm Bookbinder, MD (Dermatology)    Assessment:   This is a routine wellness examination for Margia. Physical assessment deferred to PCP.   Exercise Activities and Dietary recommendations Current Exercise Habits: Home exercise routine, Type of exercise: treadmill;walking;calisthenics, Time (Minutes): 50, Frequency (Times/Week): 4, Weekly Exercise (Minutes/Week): 200, Intensity: Mild, Exercise limited by: None identified  Diet (meal preparation, eat out, water intake, caffeinated beverages, dairy products, fruits and vegetables): in general, a "healthy" diet  , well balanced, eats a variety of fruits and vegetables daily, limits salt, fat/cholesterol, sugar,carbohydrates,caffeine, drinks 6-8 glasses of water daily.  Goals    . Patient Stated     Maintain current health status, enjoy life and family. Travel in our motor home as much as possible.       Fall Risk Fall Risk  10/05/2017 07/15/2017 04/20/2016 01/22/2016 12/25/2015  Falls in the past year? No No No No No  Comment - - - Emmi Telephone Survey: data to providers prior to load -    Depression Screen PHQ 2/9 Scores 10/05/2017 07/15/2017 04/20/2016 12/25/2015  PHQ - 2 Score 0 0 0 0     Cognitive Function MMSE - Mini  Mental State Exam 12/25/2015  Not completed: (No Data)        Immunization History  Administered Date(s) Administered  . Influenza Whole 03/17/2009, 02/28/2012  . Influenza, High Dose Seasonal PF 02/17/2015, 02/12/2016, 02/01/2017  . Influenza,inj,Quad PF,6+ Mos 02/27/2013, 03/20/2014  . Influenza-Unspecified 02/17/2015  . Pneumococcal Conjugate-13 03/28/2013  . Pneumococcal Polysaccharide-23 08/23/2004, 01/07/2010, 04/15/2015  . Td 07/08/1998, 01/07/2010  . Zoster 03/14/2006   Screening Tests Health Maintenance  Topic Date Due  . INFLUENZA VACCINE  12/22/2017  . TETANUS/TDAP  01/08/2020  . DEXA SCAN  Completed  . PNA vac Low Risk Adult  Completed      Plan:     Prescription refills order per patient's request.  Continue doing brain stimulating activities (puzzles, reading, adult coloring books, staying active) to keep memory sharp.   Continue to eat heart healthy diet (full of fruits, vegetables, whole grains, lean protein, water--limit salt, fat, and sugar intake) and increase physical activity as tolerated.   I have personally reviewed and noted the following in the patient's chart:   . Medical and social history . Use of alcohol, tobacco or illicit drugs  . Current medications and supplements . Functional ability and status . Nutritional status . Physical activity . Advanced directives . List of other physicians . Vitals . Screenings to include cognitive, depression, and falls . Referrals and appointments  In addition, I have reviewed and discussed with patient certain preventive protocols, quality metrics, and best practice recommendations. A written personalized care plan for preventive services as well as general preventive health recommendations were provided to patient.     Michiel Cowboy, RN  10/05/2017  Medical screening examination/treatment/procedure(s) were performed by non-physician practitioner and as supervising physician I was immediately available  for consultation/collaboration. I agree with above. Lew Dawes, MD

## 2017-10-05 ENCOUNTER — Ambulatory Visit (INDEPENDENT_AMBULATORY_CARE_PROVIDER_SITE_OTHER): Payer: Medicare Other | Admitting: *Deleted

## 2017-10-05 ENCOUNTER — Telehealth: Payer: Self-pay | Admitting: *Deleted

## 2017-10-05 VITALS — BP 138/72 | HR 61 | Resp 18 | Ht 63.0 in | Wt 143.0 lb

## 2017-10-05 DIAGNOSIS — I10 Essential (primary) hypertension: Secondary | ICD-10-CM

## 2017-10-05 DIAGNOSIS — D0500 Lobular carcinoma in situ of unspecified breast: Secondary | ICD-10-CM

## 2017-10-05 DIAGNOSIS — E7800 Pure hypercholesterolemia, unspecified: Secondary | ICD-10-CM | POA: Diagnosis not present

## 2017-10-05 DIAGNOSIS — Z0000 Encounter for general adult medical examination without abnormal findings: Secondary | ICD-10-CM

## 2017-10-05 DIAGNOSIS — E2839 Other primary ovarian failure: Secondary | ICD-10-CM

## 2017-10-05 MED ORDER — RALOXIFENE HCL 60 MG PO TABS: 60.0000 mg | ORAL_TABLET | Freq: Every day | ORAL | 1 refills | Status: DC

## 2017-10-05 MED ORDER — IRBESARTAN-HYDROCHLOROTHIAZIDE 150-12.5 MG PO TABS: 1.0000 | ORAL_TABLET | Freq: Every day | ORAL | 1 refills | Status: DC

## 2017-10-05 MED ORDER — ATORVASTATIN CALCIUM 40 MG PO TABS: 40.0000 mg | ORAL_TABLET | Freq: Every day | ORAL | 1 refills | Status: DC

## 2017-10-05 NOTE — Telephone Encounter (Signed)
During AWV, patient asked if she continue to take Avalide. She recently learned that hydrochlorothiazide can cause cancer. She explains the medicine works well for her but is skeptical due to what she learned. Patient states that she would like for PCP to respond back to her and sending her a  MyChart message will be fine.

## 2017-10-05 NOTE — Patient Instructions (Addendum)
Continue doing brain stimulating activities (puzzles, reading, adult coloring books, staying active) to keep memory sharp.   Continue to eat heart healthy diet (full of fruits, vegetables, whole grains, lean protein, water--limit salt, fat, and sugar intake) and increase physical activity as tolerated.   Jessica Woodward , Thank you for taking time to come for your Medicare Wellness Visit. I appreciate your ongoing commitment to your health goals. Please review the following plan we discussed and let me know if I can assist you in the future.   These are the goals we discussed: Goals    . Patient Stated     Maintain current health status, enjoy life and family. Travel in our motor home as much as possible.       This is a list of the screening recommended for you and due dates:  Health Maintenance  Topic Date Due  . Flu Shot  12/22/2017  . Tetanus Vaccine  01/08/2020  . DEXA scan (bone density measurement)  Completed  . Pneumonia vaccines  Completed

## 2017-10-08 NOTE — Telephone Encounter (Signed)
She should continue taking Avalide as prescribed There are no concerns about cancer with this

## 2017-10-10 NOTE — Telephone Encounter (Signed)
Called to inform patient that PCP would like for her to continue to take Avalide prescription as written, he does not have concerns about cancer with this medication. Patient verbalized understanding.

## 2017-10-20 DIAGNOSIS — L84 Corns and callosities: Secondary | ICD-10-CM | POA: Diagnosis not present

## 2017-10-20 DIAGNOSIS — L821 Other seborrheic keratosis: Secondary | ICD-10-CM | POA: Diagnosis not present

## 2017-10-20 DIAGNOSIS — L309 Dermatitis, unspecified: Secondary | ICD-10-CM | POA: Diagnosis not present

## 2017-10-20 DIAGNOSIS — D225 Melanocytic nevi of trunk: Secondary | ICD-10-CM | POA: Diagnosis not present

## 2017-10-20 DIAGNOSIS — L918 Other hypertrophic disorders of the skin: Secondary | ICD-10-CM | POA: Diagnosis not present

## 2017-10-20 DIAGNOSIS — L814 Other melanin hyperpigmentation: Secondary | ICD-10-CM | POA: Diagnosis not present

## 2017-10-20 DIAGNOSIS — Z85828 Personal history of other malignant neoplasm of skin: Secondary | ICD-10-CM | POA: Diagnosis not present

## 2017-10-20 DIAGNOSIS — D1801 Hemangioma of skin and subcutaneous tissue: Secondary | ICD-10-CM | POA: Diagnosis not present

## 2017-10-20 DIAGNOSIS — L603 Nail dystrophy: Secondary | ICD-10-CM | POA: Diagnosis not present

## 2017-11-25 DIAGNOSIS — H2513 Age-related nuclear cataract, bilateral: Secondary | ICD-10-CM | POA: Diagnosis not present

## 2017-11-25 DIAGNOSIS — H43811 Vitreous degeneration, right eye: Secondary | ICD-10-CM | POA: Diagnosis not present

## 2017-12-12 ENCOUNTER — Ambulatory Visit: Payer: Medicare Other | Admitting: Internal Medicine

## 2017-12-22 ENCOUNTER — Ambulatory Visit (INDEPENDENT_AMBULATORY_CARE_PROVIDER_SITE_OTHER): Payer: Medicare Other | Admitting: Adult Health

## 2017-12-22 ENCOUNTER — Encounter: Payer: Self-pay | Admitting: Adult Health

## 2017-12-22 VITALS — BP 103/69 | HR 86 | Ht 64.0 in | Wt 145.6 lb

## 2017-12-22 DIAGNOSIS — Z01419 Encounter for gynecological examination (general) (routine) without abnormal findings: Secondary | ICD-10-CM | POA: Diagnosis not present

## 2017-12-22 DIAGNOSIS — Z1211 Encounter for screening for malignant neoplasm of colon: Secondary | ICD-10-CM

## 2017-12-22 DIAGNOSIS — Z1212 Encounter for screening for malignant neoplasm of rectum: Secondary | ICD-10-CM

## 2017-12-22 DIAGNOSIS — Z853 Personal history of malignant neoplasm of breast: Secondary | ICD-10-CM

## 2017-12-22 LAB — HEMOCCULT GUIAC POC 1CARD (OFFICE): Fecal Occult Blood, POC: NEGATIVE

## 2017-12-22 NOTE — Progress Notes (Addendum)
  Subjective:     Patient ID: Jessica Woodward, female   DOB: Feb 01, 1941, 77 y.o.   MRN: [id number]  HPI Jessica Woodward is a 77 year old white female in for pelvic exam, she is sp hysterectomy years ago for polyps.She is sp breast cancer in 2002.Her husband has small cell lung cancer, but is doing well.  Jessica Woodward had her physical with Dr Ronnald Ramp.  PCP is Scarlette Calico, MD with [facility].  Review of Systems  Pt is not sexually active No pain in pelvic area No problems with urination or bowel movements  Reviewed past medical,surgical, social and family history. Reviewed medications and allergies.     Objective:   Physical Exam BP 103/69 (BP Location: Right Arm, Patient Position: Sitting, Cuff Size: Normal)   Pulse 86   Ht 5\' 4"  (1.626 m)   Wt 145 lb 9.6 oz (66 kg)   BMI 24.99 kg/m    Skin warm and dry.Alert, pleasant and seems happy. Pelvic: external genitalia is normal in appearance no lesions, vagina:pale with loss of moisture and rugae,urethra has no lesions or masses noted, cervix and uterus are absent, adnexa: no masses or tenderness noted. Bladder is non tender and no masses felt. On rectal exam: has good tone, no masses, and hemoccult is negative.Does have skin tag, probably hemorrhoidal in nature,stable   Assessment:     1. Visit for pelvic exam   2. Screening for colorectal cancer   3. History of breast cancer       Plan:     Pelvic in 2 years,her request Physical and labs with PCP Mammogram yearly Colonoscopy per GI

## 2018-01-06 ENCOUNTER — Other Ambulatory Visit: Payer: Self-pay | Admitting: Adult Health

## 2018-01-10 ENCOUNTER — Encounter: Payer: Self-pay | Admitting: Internal Medicine

## 2018-01-31 ENCOUNTER — Ambulatory Visit (INDEPENDENT_AMBULATORY_CARE_PROVIDER_SITE_OTHER): Payer: Medicare Other | Admitting: Internal Medicine

## 2018-01-31 ENCOUNTER — Encounter: Payer: Self-pay | Admitting: Internal Medicine

## 2018-01-31 ENCOUNTER — Other Ambulatory Visit (INDEPENDENT_AMBULATORY_CARE_PROVIDER_SITE_OTHER): Payer: Medicare Other

## 2018-01-31 ENCOUNTER — Ambulatory Visit (INDEPENDENT_AMBULATORY_CARE_PROVIDER_SITE_OTHER)
Admission: RE | Admit: 2018-01-31 | Discharge: 2018-01-31 | Disposition: A | Discharge status: Home / Self Care | Payer: Medicare Other | Source: Ambulatory Visit | Attending: Internal Medicine | Admitting: Internal Medicine

## 2018-01-31 VITALS — BP 118/80 | HR 59 | Temp 98.2°F | Ht 64.0 in | Wt 145.8 lb

## 2018-01-31 DIAGNOSIS — E2839 Other primary ovarian failure: Secondary | ICD-10-CM | POA: Diagnosis not present

## 2018-01-31 DIAGNOSIS — L600 Ingrowing nail: Secondary | ICD-10-CM

## 2018-01-31 DIAGNOSIS — I10 Essential (primary) hypertension: Secondary | ICD-10-CM

## 2018-01-31 DIAGNOSIS — Z23 Encounter for immunization: Secondary | ICD-10-CM | POA: Diagnosis not present

## 2018-01-31 LAB — BASIC METABOLIC PANEL
BUN: 20 mg/dL (ref 6–23)
CO2: 26 mEq/L (ref 19–32)
Calcium: 9.8 mg/dL (ref 8.4–10.5)
Chloride: 105 mEq/L (ref 96–112)
Creatinine, Ser: 0.85 mg/dL (ref 0.40–1.20)
GFR: 69 mL/min (ref >60.00)
Glucose, Bld: 87 mg/dL (ref 70–99)
Potassium: 4 mEq/L (ref 3.5–5.1)
Sodium: 139 mEq/L (ref 135–145)

## 2018-01-31 LAB — TSH: TSH: 3.86 u[IU]/mL (ref 0.35–4.50)

## 2018-01-31 MED ORDER — IRBESARTAN-HYDROCHLOROTHIAZIDE 150-12.5 MG PO TABS: 1.0000 | ORAL_TABLET | Freq: Every day | ORAL | 1 refills | Status: DC

## 2018-01-31 NOTE — Patient Instructions (Signed)

## 2018-01-31 NOTE — Progress Notes (Addendum)
Subjective:  Patient ID: Jessica Woodward, female    DOB: [birth year]  Age: 77 y.o. MRN: [id number]  CC: Hypertension   HPI Jessica Woodward presents for a BP check - She complains of fatigue and right great toe pain for 6 months. She tells me that her BP has been well controlled.  Outpatient Medications Prior to Visit  Medication Sig Dispense Refill  . atorvastatin (LIPITOR) 40 MG tablet Take 1 tablet (40 mg total) by mouth daily. 90 tablet 1  . Multiple Vitamin (MULTIVITAMIN) tablet Take 1 tablet by mouth daily.    . raloxifene (EVISTA) 60 MG tablet Take 1 tablet (60 mg total) by mouth daily. 90 tablet 1  . triamcinolone cream (KENALOG) 0.1 %     . aspirin 81 MG tablet Take 81 mg by mouth daily.      . irbesartan-hydrochlorothiazide (AVALIDE) 150-12.5 MG tablet Take 1 tablet by mouth daily. 90 tablet 1  . loratadine (CLARITIN) 10 MG tablet Take 10 mg by mouth daily as needed.      . naproxen sodium (ANAPROX) 220 MG tablet Take 220 mg by mouth as needed.      No facility-administered medications prior to visit.     ROS Review of Systems  Constitutional: Positive for fatigue. Negative for diaphoresis and unexpected weight change.  HENT: Negative.   Eyes: Negative for visual disturbance.  Respiratory: Negative for cough, chest tightness, shortness of breath and wheezing.   Cardiovascular: Negative for chest pain, palpitations and leg swelling.  Gastrointestinal: Negative for abdominal pain, constipation, diarrhea, nausea and vomiting.  Endocrine: Negative.   Genitourinary: Negative.  Negative for decreased urine volume, difficulty urinating, dysuria, hematuria and urgency.  Musculoskeletal: Negative.  Negative for arthralgias, back pain, myalgias and neck pain.  Skin: Negative.  Negative for color change and rash.  Neurological: Negative.  Negative for dizziness, weakness, light-headedness and headaches.  Hematological: Negative for adenopathy. Does not bruise/bleed easily.    Psychiatric/Behavioral: Negative.     Objective:  BP 118/80 (BP Location: Left Arm, Patient Position: Sitting, Cuff Size: Normal)   Pulse (!) 59   Temp 98.2 F (36.8 C) (Oral)   Ht 5\' 4"  (1.626 m)   Wt 145 lb 12 oz (66.1 kg)   SpO2 99%   BMI 25.02 kg/m   BP Readings from Last 3 Encounters:  01/31/18 118/80  12/22/17 103/69  10/05/17 138/72    Wt Readings from Last 3 Encounters:  01/31/18 145 lb 12 oz (66.1 kg)  12/22/17 145 lb 9.6 oz (66 kg)  10/05/17 143 lb (64.9 kg)    Physical Exam  Constitutional: She is oriented to person, place, and time. No distress.  HENT:  Mouth/Throat: Oropharynx is clear and moist. No oropharyngeal exudate.  Eyes: Conjunctivae are normal. No scleral icterus.  Neck: Normal range of motion. Neck supple. No JVD present. No thyromegaly present.  Cardiovascular: Normal rate, regular rhythm and normal heart sounds. Exam reveals no gallop.  No murmur heard. Pulmonary/Chest: Effort normal and breath sounds normal. No respiratory distress. She has no wheezes. She has no rales.  Abdominal: Soft. Bowel sounds are normal. She exhibits no mass. There is no hepatosplenomegaly. There is no tenderness.  Musculoskeletal: Normal range of motion. She exhibits no edema, tenderness or deformity.  Right great toenail is ingrown but there is no erythema, warmth, exudate.  Lymphadenopathy:    She has no cervical adenopathy.  Neurological: She is alert and oriented to person, place, and time.  Skin: Skin is  warm and dry. She is not diaphoretic. No pallor.    Lab Results  Component Value Date   WBC 6.1 04/05/2017   HGB 13.5 04/05/2017   HCT 40.3 04/05/2017   PLT 268.0 04/05/2017   GLUCOSE 87 01/31/2018   CHOL 147 04/05/2017   TRIG 60.0 04/05/2017   HDL 58.10 04/05/2017   LDLDIRECT 166.9 03/23/2013   LDLCALC 77 04/05/2017   ALT 13 04/05/2017   AST 18 04/05/2017   NA 139 01/31/2018   K 4.0 01/31/2018   CL 105 01/31/2018   CREATININE 0.85 01/31/2018    BUN 20 01/31/2018   CO2 26 01/31/2018   TSH 3.86 01/31/2018   HGBA1C 5.4 01/17/2012    Ct Chest Wo Contrast  Result Date: 09/16/2017 CLINICAL DATA:  Follow-up lung nodule EXAM: CT CHEST WITHOUT CONTRAST TECHNIQUE: Multidetector CT imaging of the chest was performed following the standard protocol without IV contrast. COMPARISON:  08/18/2016.  06/23/2015. FINDINGS: Cardiovascular: Retroesophageal right subclavian artery noted. Scattered coronary artery and aortic calcifications. No evidence of aortic aneurysm. Heart is normal size. Mediastinum/Nodes: No mediastinal, hilar, or axillary adenopathy. Lungs/Pleura: Biapical scarring, stable. 4 mm nodule in the lateral right lower lobe on image 112 is stable. 7 mm nodule in the adjacent right middle lobe on 114 is stable. These areas are associated with areas of scarring which are stable. No effusions. No new or enlarging pulmonary nodules. Upper Abdomen: Imaging into the upper abdomen shows no acute findings. Musculoskeletal: Chest wall soft tissues are unremarkable. No acute bony abnormality. IMPRESSION: Nodules in the peripheral right lower lobe and adjacent right middle lobe associated with areas of scarring are stable dating back to January 2017 and are compatible with benign nodules. No additional follow-up necessary. Scattered coronary artery disease, aortic atherosclerosis. Biapical scarring. Electronically Signed   By: Rolm Baptise M.D.   On: 09/16/2017 07:56    Assessment & Plan:   [name] was seen today for hypertension.  Diagnoses and all orders for this visit:  Essential hypertension, benign- Her blood pressure is adequately well controlled.  Her labs are negative for secondary causes or endorgan damage.  Her electrolytes and renal function are normal.  Will continue the ARB and the thiazide diuretic at the current doses. -     Basic Metabolic Panel (BMET); Future -     TSH; Future -     irbesartan-hydrochlorothiazide (AVALIDE) 150-12.5 MG  tablet; Take 1 tablet by mouth daily.  Need for influenza vaccination -     Flu vaccine HIGH DOSE PF (Fluzone High dose)   I have discontinued [name]'s aspirin, loratadine, and naproxen sodium. I am also having her maintain her triamcinolone cream, multivitamin, atorvastatin, raloxifene, and irbesartan-hydrochlorothiazide.  Meds ordered this encounter  Medications  . irbesartan-hydrochlorothiazide (AVALIDE) 150-12.5 MG tablet    Sig: Take 1 tablet by mouth daily.    Dispense:  90 tablet    Refill:  1     Follow-up: Return in about 6 months (around 08/01/2018).  Scarlette Calico, MD

## 2018-02-01 DIAGNOSIS — L600 Ingrowing nail: Secondary | ICD-10-CM | POA: Insufficient documentation

## 2018-02-01 NOTE — Addendum Note (Signed)
Addended by: Janith Lima on: 02/01/2018 07:09 AM   Modules accepted: Orders

## 2018-02-27 ENCOUNTER — Ambulatory Visit (INDEPENDENT_AMBULATORY_CARE_PROVIDER_SITE_OTHER): Payer: Medicare Other | Admitting: Podiatry

## 2018-02-27 ENCOUNTER — Encounter: Payer: Self-pay | Admitting: Podiatry

## 2018-02-27 VITALS — BP 120/74 | HR 68

## 2018-02-27 DIAGNOSIS — L600 Ingrowing nail: Secondary | ICD-10-CM | POA: Diagnosis not present

## 2018-02-27 NOTE — Patient Instructions (Signed)

## 2018-02-28 NOTE — Progress Notes (Signed)
   Subjective: Patient presents today for evaluation of pain to the medial border of the right hallux that began about nine months ago. Patient is concerned for possible ingrown nail. She states she was seen by her PCP and was referred here. Applying pressure to the toe and wearing shoes increases the pain. She has been soaking the toe and trimming the nail out with no significant relief. Patient presents today for further treatment and evaluation.   Past Medical History:  Diagnosis Date  . Arthritis   . Breast disorder    cancer  . CARCINOMA, SQUAMOUS CELL, HX OF 06/05/2007  . DUCTAL CARCINOMA IN SITU, LEFT BREAST 2002   s/p left lumpectomy, xrt and tamoxifen  . History of chemotherapy   . Hypertension   . Personal history of chemotherapy 2002  . Personal history of radiation therapy 2002  . RADIATION THERAPY, HX OF   . Seasonal allergies   . Skin cancer     Objective:  General: Well developed, nourished, in no acute distress, alert and oriented x3   Dermatology: Skin is warm, dry and supple bilateral. Medial border right hallux appears to be erythematous with evidence of an ingrowing nail. Pain on palpation noted to the border of the nail fold. The remaining nails appear unremarkable at this time. There are no open sores, lesions.  Vascular: Dorsalis Pedis artery and Posterior Tibial artery pedal pulses palpable. No lower extremity edema noted.   Neruologic: Grossly intact via light touch bilateral.  Musculoskeletal: Muscular strength within normal limits in all groups bilateral. Normal range of motion noted to all pedal and ankle joints.   Assesement: #1 Paronychia with ingrowing nail medial border right hallux  #2 Pain in toe #3 Incurvated nail  Plan of Care:  1. Patient evaluated.  2. Discussed treatment alternatives and plan of care. Explained nail avulsion procedure and post procedure course to patient. 3. Patient opted for temporary partial nail avulsion of the medial  border of the right hallux.  4. Prior to procedure, local anesthesia infiltration utilized using 3 ml of a 50:50 mixture of 2% plain lidocaine and 0.5% plain marcaine in a normal hallux block fashion and a betadine prep performed.  5. Light dressing applied. 6. Return to clinic as needed.   Edrick Kins, DPM [facility]  Dr. Edrick Kins, [place]                                        [place], [place] [address]                Office [phone]  Fax [phone]

## 2018-03-09 ENCOUNTER — Telehealth: Payer: Self-pay | Admitting: Podiatry

## 2018-03-09 NOTE — Telephone Encounter (Signed)
I asked pt to describe the toenail area. Pt stated the area was bubbly like a soft scab, she has been doing white vinegar soaks and polysporin cream, feels tight sometimes but doesn't have redness, streaking or fever. I told pt she may have the soft scab for up to the 3rd week, but I would transfer her to schedulers for an appt with the nurse tomorrow. Pt states she would like to wait and see, she did not realize it would take so long. I told pt she may want to switch back to the epsom salt, it would probably not be so painful this far into the healing process. I told pt she could call at anytime we would have some one that could help her.

## 2018-03-09 NOTE — Telephone Encounter (Signed)
Pt had surgery 02/27/2018 and she is concerned about her foot. Not heeling correctly/having some pain. Please give her a call

## 2018-04-05 ENCOUNTER — Other Ambulatory Visit: Payer: Self-pay | Admitting: Internal Medicine

## 2018-04-05 DIAGNOSIS — Z1231 Encounter for screening mammogram for malignant neoplasm of breast: Secondary | ICD-10-CM

## 2018-04-09 ENCOUNTER — Other Ambulatory Visit: Payer: Self-pay | Admitting: Internal Medicine

## 2018-04-09 DIAGNOSIS — D0500 Lobular carcinoma in situ of unspecified breast: Secondary | ICD-10-CM

## 2018-04-09 DIAGNOSIS — E7800 Pure hypercholesterolemia, unspecified: Secondary | ICD-10-CM

## 2018-04-10 ENCOUNTER — Encounter: Payer: Self-pay | Admitting: Internal Medicine

## 2018-04-10 ENCOUNTER — Other Ambulatory Visit: Payer: Self-pay | Admitting: Internal Medicine

## 2018-04-10 DIAGNOSIS — D0500 Lobular carcinoma in situ of unspecified breast: Secondary | ICD-10-CM

## 2018-04-10 DIAGNOSIS — E7800 Pure hypercholesterolemia, unspecified: Secondary | ICD-10-CM

## 2018-04-10 MED ORDER — ATORVASTATIN CALCIUM 40 MG PO TABS: 40.0000 mg | ORAL_TABLET | Freq: Every day | ORAL | 1 refills | Status: DC

## 2018-04-10 MED ORDER — RALOXIFENE HCL 60 MG PO TABS: 60.0000 mg | ORAL_TABLET | Freq: Every day | ORAL | 1 refills | Status: DC

## 2018-04-13 ENCOUNTER — Encounter: Payer: Self-pay | Admitting: Podiatry

## 2018-04-24 ENCOUNTER — Encounter: Payer: Self-pay | Admitting: Podiatry

## 2018-04-24 ENCOUNTER — Ambulatory Visit (INDEPENDENT_AMBULATORY_CARE_PROVIDER_SITE_OTHER): Payer: Medicare Other | Admitting: Podiatry

## 2018-04-24 DIAGNOSIS — L600 Ingrowing nail: Secondary | ICD-10-CM

## 2018-04-25 NOTE — Progress Notes (Signed)
Subjective:   Patient ID: Jessica Woodward, female   DOB: 77 y.o.   MRN: [id number]   HPI Patient presents with discoloration of the right hallux medial side of the nail for the last couple weeks.  Patient was concerned about what this means and had ingrown toenail done several months ago   ROS      Objective:  Physical Exam  Neurovascular status intact with patient found to have discoloration of the medial side of the right hallux nail local and in the proximal nail fold that is nonpainful     Assessment:  Probability for small hematoma localized in nature with no indications of infection currently     Plan:  Reviewed condition recommended watching it and soaking as needed and if any drainage were to occur or if it were to start to grow in size or become painful we will need to biopsy

## 2018-05-08 ENCOUNTER — Ambulatory Visit: Payer: Self-pay | Admitting: Podiatry

## 2018-05-30 ENCOUNTER — Ambulatory Visit
Admission: RE | Admit: 2018-05-30 | Discharge: 2018-05-30 | Disposition: A | Discharge status: Home / Self Care | Payer: Medicare Other | Source: Ambulatory Visit | Attending: Internal Medicine | Admitting: Internal Medicine

## 2018-05-30 DIAGNOSIS — Z1231 Encounter for screening mammogram for malignant neoplasm of breast: Secondary | ICD-10-CM | POA: Diagnosis not present

## 2018-05-30 LAB — HM MAMMOGRAPHY

## 2018-07-02 ENCOUNTER — Encounter: Payer: Self-pay | Admitting: Podiatry

## 2018-07-17 ENCOUNTER — Ambulatory Visit (INDEPENDENT_AMBULATORY_CARE_PROVIDER_SITE_OTHER): Payer: Medicare Other | Admitting: Podiatry

## 2018-07-17 DIAGNOSIS — L603 Nail dystrophy: Secondary | ICD-10-CM | POA: Diagnosis not present

## 2018-07-17 DIAGNOSIS — M79674 Pain in right toe(s): Secondary | ICD-10-CM | POA: Diagnosis not present

## 2018-07-19 NOTE — Progress Notes (Signed)
   HPI: 78 year old female presenting today with a chief complaint of pain underneath the right great toenail that began about one month ago. She describes the pain as tenderness and throbbing. Walking increases the pain. She has been soaking the foot in Epsom salt and taking Ibuprofen with no significant relief. Patient is here for further evaluation and treatment.   Past Medical History:  Diagnosis Date  . Arthritis   . Breast disorder    cancer  . CARCINOMA, SQUAMOUS CELL, HX OF 06/05/2007  . DUCTAL CARCINOMA IN SITU, LEFT BREAST 2002   s/p left lumpectomy, xrt and tamoxifen  . History of chemotherapy   . Hypertension   . Personal history of chemotherapy 2002  . Personal history of radiation therapy 2002  . RADIATION THERAPY, HX OF   . Seasonal allergies   . Skin cancer      Physical Exam: General: The patient is alert and oriented x3 in no acute distress.  Dermatology: Elongated right great toenail noted. Skin is warm, dry and supple bilateral lower extremities. Negative for open lesions or macerations.  Vascular: Palpable pedal pulses bilaterally. No edema or erythema noted. Capillary refill within normal limits.  Neurological: Epicritic and protective threshold grossly intact bilaterally.   Musculoskeletal Exam: Pain with palpation noted to the right great toe. Range of motion within normal limits to all pedal and ankle joints bilateral. Muscle strength 5/5 in all groups bilateral.   Assessment: 1. Elongated nail right hallux 2. Pain in right great toe   Plan of Care:  1. Patient evaluated.  2. Recommended OTC Motrin.  3. Mechanical debridement of the right great toenail performed using a nail nipper. Filed with dremel without incident.  4. Recommended good shoe gear.  5. Return to clinic as needed.      Edrick Kins, DPM [facility]  Dr. Edrick Kins, DPM    [address], [place] [address]                 Office [phone]  Fax [phone]

## 2018-08-15 ENCOUNTER — Ambulatory Visit: Payer: Medicare Other | Admitting: Internal Medicine

## 2018-08-19 ENCOUNTER — Other Ambulatory Visit: Payer: Self-pay | Admitting: Internal Medicine

## 2018-08-19 DIAGNOSIS — E7800 Pure hypercholesterolemia, unspecified: Secondary | ICD-10-CM

## 2018-08-19 DIAGNOSIS — D0500 Lobular carcinoma in situ of unspecified breast: Secondary | ICD-10-CM

## 2018-10-09 NOTE — Progress Notes (Addendum)
Subjective:   Jessica Woodward is a 78 y.o. female who presents for Medicare Annual (Subsequent) preventive examination.  Review of Systems:  No ROS.  Medicare Wellness Virtual Visit.  Visual/audio telehealth visit, UTA vital signs.   See social history for additional risk factors.  I connected with patient 10/10/18 at  9:00 AM EDT by a video/audio enabled telemedicine application and verified that I am speaking with the correct person using two identifiers. Patient stated full name and DOB. Patient gave permission to continue with virtual visit. Patient's location was at home and Nurse's location was at [place] office.   Cardiac Risk Factors include: advanced age (>64men, >75 women);dyslipidemia;hypertension Sleep patterns: no sleep issues, feels rested on waking and sleeps 8-10 hours nightly.    Home Safety/Smoke Alarms: Feels safe in home. Smoke alarms in place.  Living environment; residence and Firearm Safety: 1-story house/ trailer. Lives with husband, no needs for DME, good support system Seat Belt Safety/Bike Helmet: Wears seat belt.      Objective:     Vitals: There were no vitals taken for this visit.  There is no height or weight on file to calculate BMI.  Advanced Directives 10/10/2018 10/05/2017 04/20/2016  Does Patient Have a Medical Advance Directive? Yes Yes No;Yes  Type of Paramedic of [place];Living will [place];Living will [place];Living will  Does patient want to make changes to medical advance directive? - - Yes (Inpatient - patient requests chaplain consult to change a medical advance directive)  Copy of [place] in Chart? No - copy requested No - copy requested Yes    Tobacco Social History   Tobacco Use  Smoking Status Former Smoker  . Types: Cigarettes  . Last attempt to quit: 12/28/1963  . Years since quitting: 54.8  Smokeless Tobacco Never Used     Counseling given:  Not Answered  Past Medical History:  Diagnosis Date  . Arthritis   . Breast disorder    cancer  . CARCINOMA, SQUAMOUS CELL, HX OF 06/05/2007  . DUCTAL CARCINOMA IN SITU, LEFT BREAST 2002   s/p left lumpectomy, xrt and tamoxifen  . History of chemotherapy   . Hypertension   . Personal history of chemotherapy 2002  . Personal history of radiation therapy 2002  . RADIATION THERAPY, HX OF   . Seasonal allergies   . Skin cancer    Past Surgical History:  Procedure Laterality Date  . ABDOMINAL HYSTERECTOMY    . athroscopy right knee  2013   torn miniscus  . BREAST BIOPSY Left 2002  . BREAST EXCISIONAL BIOPSY Right   . BREAST LUMPECTOMY Left 2002   had chemo and radiation  . BREAST SURGERY     Left breast lumpectomy with axillary sential node  . DILATION AND CURETTAGE OF UTERUS    . Left knee surgery    . MOHS SURGERY     (R) arm  . right breast lupectomy    . TONSILLECTOMY AND ADENOIDECTOMY    . TUBAL LIGATION     Family History  Problem Relation Age of Onset  . Coronary artery disease Mother   . Dementia Mother   . Macular degeneration Father   . Coronary artery disease Father   . Diabetes Father   . Cancer Brother        pancreatic  . Heart disease Maternal Grandmother   . Kidney disease Maternal Grandmother   . Heart disease Maternal Grandfather   .  Heart disease Paternal Grandmother   . Diabetes Paternal Grandmother   . Heart disease Paternal Grandfather   . Hypertension Son   . Heart attack Son   . Hypertension Son    Social History   Socioeconomic History  . Marital status: Married    Spouse name: Not on file  . Number of children: 2  . Years of education: Not on file  . Highest education level: Not on file  Occupational History  . Occupation: retired  Scientific laboratory technician  . Financial resource strain: Not hard at all  . Food insecurity:    Worry: Never true    Inability: Never true  . Transportation needs:    Medical: No    Non-medical: No  Tobacco  Use  . Smoking status: Former Smoker    Types: Cigarettes    Last attempt to quit: 12/28/1963    Years since quitting: 54.8  . Smokeless tobacco: Never Used  Substance and Sexual Activity  . Alcohol use: Yes    Comment: rare glass of wine  . Drug use: No  . Sexual activity: Not Currently    Partners: Male    Birth control/protection: Surgical    Comment: hyst  Lifestyle  . Physical activity:    Days per week: 4 days    Minutes per session: 60 min  . Stress: Not at all  Relationships  . Social connections:    Talks on phone: More than three times a week    Gets together: More than three times a week    Attends religious service: More than 4 times per year    Active member of club or organization: Not on file    Attends meetings of clubs or organizations: More than 4 times per year    Relationship status: Married  Other Topics Concern  . Not on file  Social History Narrative   HSG, [employer] college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 married '60. 2 boys - '62, '73; 11 grandchildren; 3 great-grandchildren. retired, marriage in good health.    Outpatient Encounter Medications as of 10/10/2018  Medication Sig  . aspirin EC 81 MG tablet Take 81 mg by mouth daily.  Marland Kitchen atorvastatin (LIPITOR) 40 MG tablet TAKE 1 TABLET BY MOUTH  DAILY  . irbesartan-hydrochlorothiazide (AVALIDE) 150-12.5 MG tablet Take 1 tablet by mouth daily.  Marland Kitchen loratadine (CLARITIN) 10 MG tablet Take 10 mg by mouth daily.  . Multiple Vitamin (MULTIVITAMIN) tablet Take 1 tablet by mouth daily.  . raloxifene (EVISTA) 60 MG tablet TAKE 1 TABLET BY MOUTH  DAILY  . triamcinolone cream (KENALOG) 0.1 %    No facility-administered encounter medications on file as of 10/10/2018.     Activities of Daily Living In your present state of health, do you have any difficulty performing the following activities: 10/10/2018  Hearing? N  Vision? N  Difficulty concentrating or making decisions? N  Walking or climbing stairs? N  Dressing  or bathing? N  Doing errands, shopping? N  Preparing Food and eating ? N  Using the Toilet? N  In the past six months, have you accidently leaked urine? N  Do you have problems with loss of bowel control? N  Managing your Medications? N  Managing your Finances? N  Housekeeping or managing your Housekeeping? N  Some recent data might be hidden    Patient Care Team: Janith Lima, MD as PCP - General (Internal Medicine) Estill Dooms, NP as PCP - OBGYN (Obstetrics and Gynecology) Magrinat, Sarajane Jews  Loletha Grayer, MD (Hematology and Oncology) Sydnee Cabal, MD (Orthopedic Surgery) Rutherford Guys, MD (Ophthalmology) Rolm Bookbinder, MD (Dermatology)    Assessment:   This is a routine wellness examination for Kalese. Physical assessment deferred to PCP.  Exercise Activities and Dietary recommendations Current Exercise Habits: Home exercise routine, Type of exercise: treadmill(aerobics), Time (Minutes): 45, Frequency (Times/Week): 4, Weekly Exercise (Minutes/Week): 180, Intensity: Mild, Exercise limited by: None identified  Diet (meal preparation, eat out, water intake, caffeinated beverages, dairy products, fruits and vegetables): in general, a "healthy" diet  , well balanced Diet education was attached to patient's AVS.  Goals    . patient     Continue to travel;  Small motor home and will take off     . Patient Stated     Maintain current health status, enjoy life and family. Travel in our motor home as much as possible.       Fall Risk Fall Risk  10/10/2018 10/05/2017 07/15/2017 04/20/2016 01/22/2016  Falls in the past year? 0 No No No No  Comment - - - - Emmi Telephone Survey: data to providers prior to load  Number falls in past yr: 0 - - - -    Depression Screen PHQ 2/9 Scores 10/10/2018 10/05/2017 07/15/2017 04/20/2016  PHQ - 2 Score 0 0 0 0     Cognitive Function MMSE - Mini Mental State Exam 12/25/2015  Not completed: (No Data)       Ad8 score reviewed for issues:   Issues making decisions: no  Less interest in hobbies / activities: no  Repeats questions, stories (family complaining): no  Trouble using ordinary gadgets (microwave, computer, phone):no  Forgets the month or year: no  Mismanaging finances: no  Remembering appts: no  Daily problems with thinking and/or memory: no Ad8 score is= 0  Immunization History  Administered Date(s) Administered  . Influenza Whole 03/17/2009, 02/28/2012  . Influenza, High Dose Seasonal PF 02/17/2015, 02/12/2016, 02/01/2017, 01/31/2018  . Influenza,inj,Quad PF,6+ Mos 02/27/2013, 03/20/2014  . Influenza-Unspecified 02/17/2015  . Pneumococcal Conjugate-13 03/28/2013  . Pneumococcal Polysaccharide-23 08/23/2004, 01/07/2010, 04/15/2015  . Td 07/08/1998, 01/07/2010  . Zoster 03/14/2006   Screening Tests Health Maintenance  Topic Date Due  . INFLUENZA VACCINE  12/23/2018  . TETANUS/TDAP  01/08/2020  . DEXA SCAN  Completed  . PNA vac Low Risk Adult  Completed      Plan:     Reviewed health maintenance screenings with patient today and relevant education, vaccines, and/or referrals were provided.   Continue to eat heart healthy diet (full of fruits, vegetables, whole grains, lean protein, water--limit salt, fat, and sugar intake) and increase physical activity as tolerated.  Continue doing brain stimulating activities (puzzles, reading, adult coloring books, staying active) to keep memory sharp.   I have personally reviewed and noted the following in the patient's chart:   . Medical and social history . Use of alcohol, tobacco or illicit drugs  . Current medications and supplements . Functional ability and status . Nutritional status . Physical activity . Advanced directives . List of other physicians . Screenings to include cognitive, depression, and falls . Referrals and appointments  In addition, I have reviewed and discussed with patient certain preventive protocols, quality metrics, and  best practice recommendations. A written personalized care plan for preventive services as well as general preventive health recommendations were provided to patient.     Michiel Cowboy, RN  10/10/2018  Medical screening examination/treatment/procedure(s) were performed by non-physician practitioner and as supervising physician  I was immediately available for consultation/collaboration. I agree with above. Scarlette Calico, MD

## 2018-10-10 ENCOUNTER — Other Ambulatory Visit: Payer: Self-pay | Admitting: Internal Medicine

## 2018-10-10 ENCOUNTER — Telehealth: Payer: Self-pay | Admitting: *Deleted

## 2018-10-10 ENCOUNTER — Ambulatory Visit (INDEPENDENT_AMBULATORY_CARE_PROVIDER_SITE_OTHER): Payer: Medicare Other | Admitting: *Deleted

## 2018-10-10 DIAGNOSIS — R911 Solitary pulmonary nodule: Secondary | ICD-10-CM

## 2018-10-10 DIAGNOSIS — Z0000 Encounter for general adult medical examination without abnormal findings: Secondary | ICD-10-CM | POA: Diagnosis not present

## 2018-10-10 DIAGNOSIS — R918 Other nonspecific abnormal finding of lung field: Secondary | ICD-10-CM

## 2018-10-10 NOTE — Telephone Encounter (Signed)
pls let her know that I have ordered a repeat CT without contrast  TJ

## 2018-10-10 NOTE — Telephone Encounter (Signed)
During AWV, patient stated that PCP orders a yearly chest CT to monitor lung nodules and patient noted that it is about time to follow-up. However, due to covid-19 safety measures, patient would like for PCP to decide when he felt this should be done.

## 2018-10-10 NOTE — Telephone Encounter (Signed)
Called to inform patient that PCP ordered CT scan and that someone should reach out to her within the next few days to schedule the appointment. Patient verbalized understanding and was appreciative for the call-back.

## 2018-10-11 ENCOUNTER — Telehealth: Payer: Self-pay | Admitting: *Deleted

## 2018-10-11 NOTE — Telephone Encounter (Signed)
Called patient in response to a CRM that patient left for nurse. Ms. Winger shared that she called by mistake and that she figured out that someone else called her to schedule her CT scan.

## 2018-11-03 ENCOUNTER — Inpatient Hospital Stay: Admission: RE | Admit: 2018-11-03 | Payer: Medicare Other | Source: Ambulatory Visit

## 2018-11-17 ENCOUNTER — Encounter: Payer: Self-pay | Admitting: Internal Medicine

## 2018-11-18 ENCOUNTER — Other Ambulatory Visit: Payer: Self-pay | Admitting: Internal Medicine

## 2018-11-18 DIAGNOSIS — I10 Essential (primary) hypertension: Secondary | ICD-10-CM

## 2018-11-29 DIAGNOSIS — D1801 Hemangioma of skin and subcutaneous tissue: Secondary | ICD-10-CM | POA: Diagnosis not present

## 2018-11-29 DIAGNOSIS — L438 Other lichen planus: Secondary | ICD-10-CM | POA: Diagnosis not present

## 2018-11-29 DIAGNOSIS — L814 Other melanin hyperpigmentation: Secondary | ICD-10-CM | POA: Diagnosis not present

## 2018-11-29 DIAGNOSIS — L821 Other seborrheic keratosis: Secondary | ICD-10-CM | POA: Diagnosis not present

## 2018-11-29 DIAGNOSIS — L570 Actinic keratosis: Secondary | ICD-10-CM | POA: Diagnosis not present

## 2018-11-29 DIAGNOSIS — Z85828 Personal history of other malignant neoplasm of skin: Secondary | ICD-10-CM | POA: Diagnosis not present

## 2018-11-29 DIAGNOSIS — D225 Melanocytic nevi of trunk: Secondary | ICD-10-CM | POA: Diagnosis not present

## 2018-12-07 ENCOUNTER — Telehealth: Payer: Self-pay | Admitting: *Deleted

## 2018-12-07 NOTE — Telephone Encounter (Signed)

## 2018-12-08 ENCOUNTER — Other Ambulatory Visit: Payer: Self-pay

## 2018-12-08 ENCOUNTER — Other Ambulatory Visit: Payer: Self-pay | Admitting: Internal Medicine

## 2018-12-08 ENCOUNTER — Ambulatory Visit (INDEPENDENT_AMBULATORY_CARE_PROVIDER_SITE_OTHER)
Admission: RE | Admit: 2018-12-08 | Discharge: 2018-12-08 | Disposition: A | Discharge status: Home / Self Care | Payer: Medicare Other | Source: Ambulatory Visit | Attending: Internal Medicine | Admitting: Internal Medicine

## 2018-12-08 DIAGNOSIS — R918 Other nonspecific abnormal finding of lung field: Secondary | ICD-10-CM

## 2018-12-08 DIAGNOSIS — I10 Essential (primary) hypertension: Secondary | ICD-10-CM

## 2018-12-08 DIAGNOSIS — R911 Solitary pulmonary nodule: Secondary | ICD-10-CM

## 2018-12-09 ENCOUNTER — Encounter: Payer: Self-pay | Admitting: Internal Medicine

## 2018-12-11 ENCOUNTER — Encounter: Payer: Self-pay | Admitting: Internal Medicine

## 2018-12-12 ENCOUNTER — Other Ambulatory Visit (INDEPENDENT_AMBULATORY_CARE_PROVIDER_SITE_OTHER): Payer: Medicare Other

## 2018-12-12 ENCOUNTER — Encounter: Payer: Self-pay | Admitting: Internal Medicine

## 2018-12-12 ENCOUNTER — Ambulatory Visit (INDEPENDENT_AMBULATORY_CARE_PROVIDER_SITE_OTHER): Payer: Medicare Other | Admitting: Internal Medicine

## 2018-12-12 ENCOUNTER — Other Ambulatory Visit: Payer: Self-pay

## 2018-12-12 VITALS — BP 130/70 | HR 68 | Ht 64.0 in | Wt 147.0 lb

## 2018-12-12 DIAGNOSIS — E785 Hyperlipidemia, unspecified: Secondary | ICD-10-CM

## 2018-12-12 DIAGNOSIS — N183 Chronic kidney disease, stage 3 unspecified: Secondary | ICD-10-CM | POA: Insufficient documentation

## 2018-12-12 DIAGNOSIS — I10 Essential (primary) hypertension: Secondary | ICD-10-CM | POA: Diagnosis not present

## 2018-12-12 DIAGNOSIS — E7800 Pure hypercholesterolemia, unspecified: Secondary | ICD-10-CM

## 2018-12-12 LAB — BASIC METABOLIC PANEL
BUN: 22 mg/dL (ref 6–23)
CO2: 24 mEq/L (ref 19–32)
Calcium: 9.6 mg/dL (ref 8.4–10.5)
Chloride: 106 mEq/L (ref 96–112)
Creatinine, Ser: 1.16 mg/dL (ref 0.40–1.20)
GFR: 45.24 mL/min — ABNORMAL LOW (ref >60.00)
Glucose, Bld: 85 mg/dL (ref 70–99)
Potassium: 3.9 mEq/L (ref 3.5–5.1)
Sodium: 139 mEq/L (ref 135–145)

## 2018-12-12 LAB — CBC WITH DIFFERENTIAL/PLATELET
Basophils Absolute: 0 10*3/uL (ref 0.0–0.1)
Basophils Relative: 0.6 % (ref 0.0–3.0)
Eosinophils Absolute: 0.1 10*3/uL (ref 0.0–0.7)
Eosinophils Relative: 1.3 % (ref 0.0–5.0)
HCT: 39.8 % (ref 36.0–46.0)
Hemoglobin: 13.3 g/dL (ref 12.0–15.0)
Lymphocytes Relative: 27.2 % (ref 12.0–46.0)
Lymphs Abs: 2.1 10*3/uL (ref 0.7–4.0)
MCHC: 33.4 g/dL (ref 30.0–36.0)
MCV: 92.8 fl (ref 78.0–100.0)
Monocytes Absolute: 0.7 10*3/uL (ref 0.1–1.0)
Monocytes Relative: 8.7 % (ref 3.0–12.0)
Neutro Abs: 4.7 10*3/uL (ref 1.4–7.7)
Neutrophils Relative %: 62.2 % (ref 43.0–77.0)
Platelets: 293 10*3/uL (ref 150.0–400.0)
RBC: 4.28 Mil/uL (ref 3.87–5.11)
RDW: 13.5 % (ref 11.5–15.5)
WBC: 7.6 10*3/uL (ref 4.0–10.5)

## 2018-12-12 LAB — HEPATIC FUNCTION PANEL
ALT: 13 U/L (ref 0–35)
AST: 17 U/L (ref 0–37)
Albumin: 4.2 g/dL (ref 3.5–5.2)
Alkaline Phosphatase: 56 U/L (ref 39–117)
Bilirubin, Direct: 0.1 mg/dL (ref 0.0–0.3)
Total Bilirubin: 0.5 mg/dL (ref 0.2–1.2)
Total Protein: 6.6 g/dL (ref 6.0–8.3)

## 2018-12-12 LAB — LIPID PANEL
Cholesterol: 161 mg/dL (ref 0–200)
HDL: 65.2 mg/dL (ref >39.00)
LDL Cholesterol: 71 mg/dL (ref 0–99)
NonHDL: 95.89
Total CHOL/HDL Ratio: 2
Triglycerides: 123 mg/dL (ref 0.0–149.0)
VLDL: 24.6 mg/dL (ref 0.0–40.0)

## 2018-12-12 LAB — TSH: TSH: 4.13 u[IU]/mL (ref 0.35–4.50)

## 2018-12-12 NOTE — Progress Notes (Signed)
Subjective:  Patient ID: Jessica Woodward, female    DOB: 28-Jun-1940  Age: 78 y.o. MRN: [id number]  CC: Hypertension and Hyperlipidemia   HPI Jessica Woodward presents for f/up - She tells me her health husband died a month ago from congestive heart failure.  She is managing her grief adequately.  She complains of weight gain.  She occasionally takes Aleve for joint pain.  She tells me her blood pressure has been well controlled.  She denies any recent episodes of CP, DOE, palpitations, edema, or fatigue.  Outpatient Medications Prior to Visit  Medication Sig Dispense Refill   aspirin EC 81 MG tablet Take 81 mg by mouth daily.     atorvastatin (LIPITOR) 40 MG tablet TAKE 1 TABLET BY MOUTH  DAILY 90 tablet 1   loratadine (CLARITIN) 10 MG tablet Take 10 mg by mouth daily.     Multiple Vitamin (MULTIVITAMIN) tablet Take 1 tablet by mouth daily.     raloxifene (EVISTA) 60 MG tablet TAKE 1 TABLET BY MOUTH  DAILY 90 tablet 1   triamcinolone cream (KENALOG) 0.1 %      irbesartan-hydrochlorothiazide (AVALIDE) 150-12.5 MG tablet Take 1 tablet by mouth daily. 90 tablet 1   No facility-administered medications prior to visit.     ROS Review of Systems  Constitutional: Positive for unexpected weight change (wt gain). Negative for diaphoresis and fatigue.  HENT: Negative.  Negative for trouble swallowing.   Eyes: Negative.   Respiratory: Negative for cough, chest tightness, shortness of breath and wheezing.   Cardiovascular: Negative for chest pain, palpitations and leg swelling.  Gastrointestinal: Negative for abdominal pain, blood in stool, constipation, diarrhea, nausea and vomiting.  Endocrine: Negative.   Genitourinary: Negative.  Negative for difficulty urinating.  Musculoskeletal: Positive for arthralgias. Negative for myalgias and neck pain.  Skin: Negative.  Negative for color change and pallor.  Neurological: Negative.  Negative for dizziness, weakness and light-headedness.    Hematological: Negative for adenopathy. Does not bruise/bleed easily.  Psychiatric/Behavioral: Negative.     Objective:  BP 130/70 (BP Location: Left Arm, Patient Position: Sitting, Cuff Size: Normal)    Pulse 68    Ht 5\' 4"  (1.626 m)    Wt 147 lb (66.7 kg)    SpO2 98%    BMI 25.23 kg/m   BP Readings from Last 3 Encounters:  12/12/18 130/70  02/27/18 120/74  01/31/18 118/80    Wt Readings from Last 3 Encounters:  12/12/18 147 lb (66.7 kg)  01/31/18 145 lb 12 oz (66.1 kg)  12/22/17 145 lb 9.6 oz (66 kg)    Physical Exam Vitals signs reviewed.  HENT:     Nose: Nose normal.     Mouth/Throat:     Mouth: Mucous membranes are moist.     Pharynx: No oropharyngeal exudate.  Eyes:     General: No scleral icterus.    Conjunctiva/sclera: Conjunctivae normal.  Neck:     Musculoskeletal: Normal range of motion. No neck rigidity.  Cardiovascular:     Rate and Rhythm: Normal rate and regular rhythm.     Heart sounds: No murmur.  Pulmonary:     Effort: Pulmonary effort is normal.     Breath sounds: No stridor. No wheezing, rhonchi or rales.  Abdominal:     General: Abdomen is flat. Bowel sounds are normal. There is no distension.     Palpations: There is no hepatomegaly, splenomegaly or mass.     Tenderness: There is no abdominal tenderness.  Musculoskeletal: Normal range of motion.     Right lower leg: No edema.     Left lower leg: No edema.  Lymphadenopathy:     Cervical: No cervical adenopathy.  Skin:    General: Skin is warm and dry.     Coloration: Skin is not pale.  Neurological:     General: No focal deficit present.     Mental Status: Mental status is at baseline.  Psychiatric:        Mood and Affect: Mood normal.        Behavior: Behavior normal.        Thought Content: Thought content normal.        Judgment: Judgment normal.     Lab Results  Component Value Date   WBC 7.6 12/12/2018   HGB 13.3 12/12/2018   HCT 39.8 12/12/2018   PLT 293.0 12/12/2018    GLUCOSE 85 12/12/2018   CHOL 161 12/12/2018   TRIG 123.0 12/12/2018   HDL 65.20 12/12/2018   LDLDIRECT 166.9 03/23/2013   LDLCALC 71 12/12/2018   ALT 13 12/12/2018   AST 17 12/12/2018   NA 139 12/12/2018   K 3.9 12/12/2018   CL 106 12/12/2018   CREATININE 1.16 12/12/2018   BUN 22 12/12/2018   CO2 24 12/12/2018   TSH 4.13 12/12/2018   HGBA1C 5.4 01/17/2012    Ct Chest Wo Contrast  Result Date: 12/08/2018 CLINICAL DATA:  Follow-up lung nodules. EXAM: CT CHEST WITHOUT CONTRAST TECHNIQUE: Multidetector CT imaging of the chest was performed following the standard protocol without IV contrast. COMPARISON:  08/10/2016 FINDINGS: Cardiovascular: Heart is normal size. Minimal ectasias of the ascending thoracic aorta without significant change. Mild calcified plaque over the thoracic aorta. Minimal calcified plaque over the left anterior descending and right coronary arteries. Abdomen takeoff of the right subclavian artery coursing posterior to the esophagus. Remaining vascular structures are unremarkable. Mediastinum/Nodes: No mediastinal or hilar adenopathy. Remaining mediastinal structures are unremarkable. Lungs/Pleura: Lungs are adequately inflated with no focal airspace consolidation or effusion. Biapical pleural thickening unchanged. Minimal scarring over the lateral right lower lobe and adjacent the lateral aspect of the right major fissure unchanged. There is subtle nodularity along the subpleural aspect of this linear scarring which is unchanged and therefore benign. No new nodules identified. Small calcified granuloma over the left upper lobe. Azygos fissure is present. Airways are normal. Upper Abdomen: 3 small liver hypodensities unchanged likely cysts. Gallbladder somewhat contracted. Calcified plaque over the abdominal aorta. Musculoskeletal: Mild degenerative change of the spine. IMPRESSION: 1.  No acute cardiopulmonary disease. 2. 2 tiny nodular opacities over the lateral aspect of the  right middle lobe and associated with scarring of the lateral right lower lobe as these are unchanged from March 2018 and therefore benign. No new nodules. 3.  Aberrant takeoff of the right subclavian artery. 4. Aortic Atherosclerosis (ICD10-I70.0). Atherosclerotic coronary artery disease. 5.  Three small stable liver hypodensities likely cysts. Electronically Signed   By: Marin Olp M.D.   On: 12/08/2018 20:59    Assessment & Plan:   Jessica Woodward was seen today for hypertension and hyperlipidemia.  Diagnoses and all orders for this visit:  Essential hypertension, benign- Her blood pressure is adequately well controlled. -     CBC with Differential/Platelet; Future -     Basic metabolic panel; Future  Dyslipidemia, goal LDL below 130- She has achieved her LDL goal and is doing well on the statin. -     Lipid panel; Future -  TSH; Future -     Hepatic function panel; Future  Chronic renal disease, stage 3, moderately decreased glomerular filtration rate (GFR) between 30-59 mL/min/1.73 square meter (HCC)-her blood pressure is adequately well controlled.  I have asked her to avoid anti-inflammatories and other nephrotoxic agents.   I am having Rudi Knippenberg. Dipasquale "Jessica Woodward" maintain her triamcinolone cream, multivitamin, aspirin EC, raloxifene, atorvastatin, and loratadine.  No orders of the defined types were placed in this encounter.    Follow-up: Return in about 6 months (around 06/14/2019).  Jessica Calico, MD

## 2018-12-12 NOTE — Patient Instructions (Signed)

## 2018-12-13 ENCOUNTER — Encounter: Payer: Self-pay | Admitting: Internal Medicine

## 2018-12-13 DIAGNOSIS — I10 Essential (primary) hypertension: Secondary | ICD-10-CM

## 2018-12-13 MED ORDER — IRBESARTAN-HYDROCHLOROTHIAZIDE 150-12.5 MG PO TABS: 1.0000 | ORAL_TABLET | Freq: Every day | ORAL | 1 refills | Status: DC

## 2018-12-14 MED ORDER — ATORVASTATIN CALCIUM 40 MG PO TABS: 40.0000 mg | ORAL_TABLET | Freq: Every day | ORAL | 1 refills | Status: DC

## 2018-12-19 ENCOUNTER — Encounter: Payer: Self-pay | Admitting: Internal Medicine

## 2018-12-20 ENCOUNTER — Other Ambulatory Visit: Payer: Self-pay | Admitting: Internal Medicine

## 2018-12-20 DIAGNOSIS — E785 Hyperlipidemia, unspecified: Secondary | ICD-10-CM

## 2018-12-20 MED ORDER — ATORVASTATIN CALCIUM 40 MG PO TABS: 40.0000 mg | ORAL_TABLET | Freq: Every day | ORAL | 1 refills | Status: DC

## 2019-01-19 ENCOUNTER — Other Ambulatory Visit: Payer: Self-pay | Admitting: Internal Medicine

## 2019-01-19 ENCOUNTER — Encounter: Payer: Self-pay | Admitting: Internal Medicine

## 2019-01-19 DIAGNOSIS — H43811 Vitreous degeneration, right eye: Secondary | ICD-10-CM | POA: Diagnosis not present

## 2019-01-19 DIAGNOSIS — H2513 Age-related nuclear cataract, bilateral: Secondary | ICD-10-CM | POA: Diagnosis not present

## 2019-01-19 DIAGNOSIS — H04123 Dry eye syndrome of bilateral lacrimal glands: Secondary | ICD-10-CM | POA: Diagnosis not present

## 2019-01-19 DIAGNOSIS — E785 Hyperlipidemia, unspecified: Secondary | ICD-10-CM

## 2019-01-19 MED ORDER — ATORVASTATIN CALCIUM 40 MG PO TABS: 40.0000 mg | ORAL_TABLET | Freq: Every day | ORAL | 1 refills | Status: DC

## 2019-01-26 ENCOUNTER — Other Ambulatory Visit: Payer: Self-pay | Admitting: Internal Medicine

## 2019-01-26 DIAGNOSIS — D0500 Lobular carcinoma in situ of unspecified breast: Secondary | ICD-10-CM

## 2019-02-14 ENCOUNTER — Other Ambulatory Visit: Payer: Self-pay

## 2019-02-14 ENCOUNTER — Ambulatory Visit (INDEPENDENT_AMBULATORY_CARE_PROVIDER_SITE_OTHER): Payer: Medicare Other

## 2019-02-14 DIAGNOSIS — Z23 Encounter for immunization: Secondary | ICD-10-CM

## 2019-02-22 ENCOUNTER — Ambulatory Visit (INDEPENDENT_AMBULATORY_CARE_PROVIDER_SITE_OTHER): Payer: Medicare Other | Admitting: Internal Medicine

## 2019-02-22 ENCOUNTER — Encounter: Payer: Self-pay | Admitting: Internal Medicine

## 2019-02-22 ENCOUNTER — Other Ambulatory Visit (INDEPENDENT_AMBULATORY_CARE_PROVIDER_SITE_OTHER): Payer: Medicare Other

## 2019-02-22 ENCOUNTER — Other Ambulatory Visit: Payer: Self-pay

## 2019-02-22 VITALS — BP 128/80 | HR 72 | Temp 98.2°F | Resp 16 | Ht 64.0 in | Wt 149.0 lb

## 2019-02-22 DIAGNOSIS — I10 Essential (primary) hypertension: Secondary | ICD-10-CM

## 2019-02-22 DIAGNOSIS — K921 Melena: Secondary | ICD-10-CM

## 2019-02-22 LAB — CBC WITH DIFFERENTIAL/PLATELET
Basophils Absolute: 0.1 10*3/uL (ref 0.0–0.1)
Basophils Relative: 1 % (ref 0.0–3.0)
Eosinophils Absolute: 0.1 10*3/uL (ref 0.0–0.7)
Eosinophils Relative: 0.8 % (ref 0.0–5.0)
HCT: 40.9 % (ref 36.0–46.0)
Hemoglobin: 13.8 g/dL (ref 12.0–15.0)
Lymphocytes Relative: 22.6 % (ref 12.0–46.0)
Lymphs Abs: 1.9 10*3/uL (ref 0.7–4.0)
MCHC: 33.7 g/dL (ref 30.0–36.0)
MCV: 93.5 fl (ref 78.0–100.0)
Monocytes Absolute: 0.6 10*3/uL (ref 0.1–1.0)
Monocytes Relative: 7.5 % (ref 3.0–12.0)
Neutro Abs: 5.8 10*3/uL (ref 1.4–7.7)
Neutrophils Relative %: 68.1 % (ref 43.0–77.0)
Platelets: 297 10*3/uL (ref 150.0–400.0)
RBC: 4.38 Mil/uL (ref 3.87–5.11)
RDW: 13.6 % (ref 11.5–15.5)
WBC: 8.5 10*3/uL (ref 4.0–10.5)

## 2019-02-22 LAB — BASIC METABOLIC PANEL
BUN: 23 mg/dL (ref 6–23)
CO2: 27 mEq/L (ref 19–32)
Calcium: 10.1 mg/dL (ref 8.4–10.5)
Chloride: 103 mEq/L (ref 96–112)
Creatinine, Ser: 0.9 mg/dL (ref 0.40–1.20)
GFR: 60.61 mL/min (ref >60.00)
Glucose, Bld: 93 mg/dL (ref 70–99)
Potassium: 4 mEq/L (ref 3.5–5.1)
Sodium: 140 mEq/L (ref 135–145)

## 2019-02-22 NOTE — Patient Instructions (Signed)

## 2019-02-22 NOTE — Progress Notes (Signed)
Subjective:  Patient ID: Jessica Woodward, female    DOB: Apr 14, 1941  Age: 78 y.o. MRN: [id number]  CC: Hypertension   HPI Jessica Woodward presents for f/up - She thinks her stools have been darker than usual for the last 6 weeks.  She does not actually use the words black, tarry, sticky, or bloody.  She does not know what she could be eating or taking that would make her stool suddenly turned dark.  During this time she has noticed weight gain and denies abdominal pain, nausea, vomiting, early satiety, loss of appetite, diarrhea, constipation, or bright red blood per rectum.  She does not smoke, take NSAIDs, or drink alcohol.  Outpatient Medications Prior to Visit  Medication Sig Dispense Refill   atorvastatin (LIPITOR) 40 MG tablet Take 1 tablet (40 mg total) by mouth daily. 90 tablet 1   irbesartan-hydrochlorothiazide (AVALIDE) 150-12.5 MG tablet Take 1 tablet by mouth daily. 90 tablet 1   loratadine (CLARITIN) 10 MG tablet Take 10 mg by mouth daily.     Multiple Vitamin (MULTIVITAMIN) tablet Take 1 tablet by mouth daily.     raloxifene (EVISTA) 60 MG tablet TAKE 1 TABLET BY MOUTH  DAILY 90 tablet 1   triamcinolone cream (KENALOG) 0.1 %      aspirin EC 81 MG tablet Take 81 mg by mouth daily.     No facility-administered medications prior to visit.     ROS Review of Systems  Constitutional: Positive for unexpected weight change (wt gain). Negative for appetite change, diaphoresis and fatigue.  HENT: Negative.  Negative for trouble swallowing and voice change.   Respiratory: Negative for cough, chest tightness, shortness of breath and wheezing.   Cardiovascular: Negative for chest pain, palpitations and leg swelling.  Gastrointestinal: Negative for abdominal pain, blood in stool, constipation, diarrhea and nausea.  Endocrine: Negative.   Genitourinary: Negative.  Negative for decreased urine volume, difficulty urinating, dysuria, hematuria and urgency.  Musculoskeletal:  Negative.  Negative for arthralgias and myalgias.  Skin: Negative for color change and pallor.  Neurological: Negative.  Negative for dizziness, weakness, light-headedness and headaches.  Hematological: Negative for adenopathy. Does not bruise/bleed easily.  Psychiatric/Behavioral: Negative.     Objective:  BP 128/80    Pulse 72    Temp 98.2 F (36.8 C) (Oral)    Resp 16    Ht 5\' 4"  (1.626 m)    Wt 149 lb (67.6 kg)    SpO2 98%    BMI 25.58 kg/m   BP Readings from Last 3 Encounters:  02/22/19 128/80  12/12/18 130/70  02/27/18 120/74    Wt Readings from Last 3 Encounters:  02/22/19 149 lb (67.6 kg)  12/12/18 147 lb (66.7 kg)  01/31/18 145 lb 12 oz (66.1 kg)    Physical Exam Vitals signs reviewed.  Constitutional:      General: She is not in acute distress.    Appearance: Normal appearance. She is not ill-appearing, toxic-appearing or diaphoretic.  HENT:     Nose: Nose normal.     Mouth/Throat:     Mouth: Mucous membranes are moist.  Eyes:     General: No scleral icterus.    Conjunctiva/sclera: Conjunctivae normal.  Neck:     Musculoskeletal: Normal range of motion. No muscular tenderness.  Cardiovascular:     Rate and Rhythm: Normal rate and regular rhythm.     Heart sounds: No murmur.  Pulmonary:     Effort: Pulmonary effort is normal.  Breath sounds: No stridor. No wheezing, rhonchi or rales.  Abdominal:     General: Abdomen is protuberant. Bowel sounds are normal. There is no distension.     Palpations: Abdomen is soft. There is no hepatomegaly or splenomegaly.     Tenderness: There is no abdominal tenderness.  Genitourinary:    Rectum: Normal. Guaiac result negative. No tenderness, anal fissure, external hemorrhoid or internal hemorrhoid. Normal anal tone.  Musculoskeletal: Normal range of motion.     Right lower leg: No edema.     Left lower leg: No edema.  Lymphadenopathy:     Cervical: No cervical adenopathy.  Skin:    General: Skin is warm and dry.    Neurological:     General: No focal deficit present.     Mental Status: She is alert.  Psychiatric:        Mood and Affect: Mood normal.        Behavior: Behavior normal.     Lab Results  Component Value Date   WBC 8.5 02/22/2019   HGB 13.8 02/22/2019   HCT 40.9 02/22/2019   PLT 297.0 02/22/2019   GLUCOSE 93 02/22/2019   CHOL 161 12/12/2018   TRIG 123.0 12/12/2018   HDL 65.20 12/12/2018   LDLDIRECT 166.9 03/23/2013   LDLCALC 71 12/12/2018   ALT 13 12/12/2018   AST 17 12/12/2018   NA 140 02/22/2019   K 4.0 02/22/2019   CL 103 02/22/2019   CREATININE 0.90 02/22/2019   BUN 23 02/22/2019   CO2 27 02/22/2019   TSH 4.13 12/12/2018   HGBA1C 5.4 01/17/2012    Ct Chest Wo Contrast  Result Date: 12/08/2018 CLINICAL DATA:  Follow-up lung nodules. EXAM: CT CHEST WITHOUT CONTRAST TECHNIQUE: Multidetector CT imaging of the chest was performed following the standard protocol without IV contrast. COMPARISON:  08/10/2016 FINDINGS: Cardiovascular: Heart is normal size. Minimal ectasias of the ascending thoracic aorta without significant change. Mild calcified plaque over the thoracic aorta. Minimal calcified plaque over the left anterior descending and right coronary arteries. Abdomen takeoff of the right subclavian artery coursing posterior to the esophagus. Remaining vascular structures are unremarkable. Mediastinum/Nodes: No mediastinal or hilar adenopathy. Remaining mediastinal structures are unremarkable. Lungs/Pleura: Lungs are adequately inflated with no focal airspace consolidation or effusion. Biapical pleural thickening unchanged. Minimal scarring over the lateral right lower lobe and adjacent the lateral aspect of the right major fissure unchanged. There is subtle nodularity along the subpleural aspect of this linear scarring which is unchanged and therefore benign. No new nodules identified. Small calcified granuloma over the left upper lobe. Azygos fissure is present. Airways are  normal. Upper Abdomen: 3 small liver hypodensities unchanged likely cysts. Gallbladder somewhat contracted. Calcified plaque over the abdominal aorta. Musculoskeletal: Mild degenerative change of the spine. IMPRESSION: 1.  No acute cardiopulmonary disease. 2. 2 tiny nodular opacities over the lateral aspect of the right middle lobe and associated with scarring of the lateral right lower lobe as these are unchanged from March 2018 and therefore benign. No new nodules. 3.  Aberrant takeoff of the right subclavian artery. 4. Aortic Atherosclerosis (ICD10-I70.0). Atherosclerotic coronary artery disease. 5.  Three small stable liver hypodensities likely cysts. Electronically Signed   By: Marin Olp M.D.   On: 12/08/2018 20:59    Assessment & Plan:   Charles was seen today for hypertension.  Diagnoses and all orders for this visit:  Complaint of melena- Her stool is heme-negative today.  Her H&H have actually increased compared to the  previous CBC.  Her other labs are reassuring.  I do not know what is causing her dark stool but I do not think it is blood loss.  She will let me know if she develops any new or worsening symptoms. -     CBC with Differential/Platelet; Future  Essential hypertension, benign- Her blood pressure is adequately well controlled. -     CBC with Differential/Platelet; Future -     Basic metabolic panel; Future   I have discontinued Naly Ireson. [place] "Sandy"'s aspirin EC. I am also having her maintain her triamcinolone cream, multivitamin, loratadine, irbesartan-hydrochlorothiazide, atorvastatin, and raloxifene.  No orders of the defined types were placed in this encounter.    Follow-up: Return in about 3 months (around 05/25/2019).  Scarlette Calico, MD

## 2019-04-09 ENCOUNTER — Other Ambulatory Visit: Payer: Self-pay | Admitting: Internal Medicine

## 2019-04-09 DIAGNOSIS — Z1231 Encounter for screening mammogram for malignant neoplasm of breast: Secondary | ICD-10-CM

## 2019-04-15 ENCOUNTER — Other Ambulatory Visit: Payer: Self-pay | Admitting: Internal Medicine

## 2019-04-15 DIAGNOSIS — I10 Essential (primary) hypertension: Secondary | ICD-10-CM

## 2019-05-10 ENCOUNTER — Ambulatory Visit (INDEPENDENT_AMBULATORY_CARE_PROVIDER_SITE_OTHER): Payer: Medicare Other | Admitting: Internal Medicine

## 2019-05-10 ENCOUNTER — Other Ambulatory Visit: Payer: Self-pay

## 2019-05-10 ENCOUNTER — Encounter: Payer: Self-pay | Admitting: Internal Medicine

## 2019-05-10 ENCOUNTER — Ambulatory Visit (INDEPENDENT_AMBULATORY_CARE_PROVIDER_SITE_OTHER)
Admission: RE | Admit: 2019-05-10 | Discharge: 2019-05-10 | Disposition: A | Discharge status: Home / Self Care | Payer: Medicare Other | Source: Ambulatory Visit | Attending: Internal Medicine | Admitting: Internal Medicine

## 2019-05-10 VITALS — BP 120/68 | HR 68 | Temp 97.8°F | Resp 16 | Ht 64.0 in | Wt 144.0 lb

## 2019-05-10 DIAGNOSIS — M542 Cervicalgia: Secondary | ICD-10-CM | POA: Diagnosis not present

## 2019-05-10 DIAGNOSIS — M5030 Other cervical disc degeneration, unspecified cervical region: Secondary | ICD-10-CM | POA: Diagnosis not present

## 2019-05-10 DIAGNOSIS — N1831 Chronic kidney disease, stage 3a: Secondary | ICD-10-CM

## 2019-05-10 DIAGNOSIS — I10 Essential (primary) hypertension: Secondary | ICD-10-CM

## 2019-05-10 MED ORDER — MELOXICAM 7.5 MG PO TABS: 7.5000 mg | ORAL_TABLET | Freq: Every day | ORAL | 2 refills | Status: DC

## 2019-05-10 MED ORDER — CYCLOBENZAPRINE HCL 5 MG PO TABS: 5.0000 mg | ORAL_TABLET | Freq: Three times a day (TID) | ORAL | 2 refills | Status: DC | PRN

## 2019-05-10 NOTE — Patient Instructions (Signed)
Musculoskeletal Pain Musculoskeletal pain refers to aches and pains in your bones, joints, muscles, and the tissues that surround them. This pain can occur in any part of the body. It can last for a short time (acute) or a long time (chronic). A physical exam, lab tests, and imaging studies may be done to find the cause of your musculoskeletal pain. Follow these instructions at home:  Lifestyle  Try to control or lower your stress levels. Stress increases muscle tension and can worsen musculoskeletal pain. It is important to recognize when you are anxious or stressed and learn ways to manage it. This may include: ? Meditation or yoga. ? Cognitive or behavioral therapy. ? Acupuncture or massage therapy.  You may continue all activities unless the activities cause more pain. When the pain gets better, slowly resume your normal activities. Gradually increase the intensity and duration of your activities or exercise. Managing pain, stiffness, and swelling  Take over-the-counter and prescription medicines only as told by your health care provider.  When your pain is severe, bed rest may be helpful. Lie or sit in any position that is comfortable, but get out of bed and walk around at least every couple of hours.  If directed, apply heat to the affected area as often as told by your health care provider. Use the heat source that your health care provider recommends, such as a moist heat pack or a heating pad. ? Place a towel between your skin and the heat source. ? Leave the heat on for 20-30 minutes. ? Remove the heat if your skin turns bright red. This is especially important if you are unable to feel pain, heat, or cold. You may have a greater risk of getting burned.  If directed, put ice on the painful area. ? Put ice in a plastic bag. ? Place a towel between your skin and the bag. ? Leave the ice on for 20 minutes, 2-3 times a day. General instructions  Your health care provider may  recommend that you see a physical therapist. This person can help you come up with a safe exercise program. Do any exercises as told by your physical therapist.  Keep all follow-up visits, including any physical therapy visits, as told by your health care providers. This is important. Contact a health care provider if:  Your pain gets worse.  Medicines do not help ease your pain.  You cannot use the part of your body that hurts, such as your arm, leg, or neck.  You have trouble sleeping.  You have trouble doing your normal activities. Get help right away if:  You have a new injury and your pain is worse or different.  You feel numb or you have tingling in the painful area. Summary  Musculoskeletal pain refers to aches and pains in your bones, joints, muscles, and the tissues that surround them.  This pain can occur in any part of the body.  Your health care provider may recommend that you see a physical therapist. This person can help you come up with a safe exercise program. Do any exercises as told by your physical therapist.  Lower your stress level. Stress can worsen musculoskeletal pain. Ways to lower stress may include meditation, yoga, cognitive or behavioral therapy, acupuncture, and massage therapy. This information is not intended to replace advice given to you by your health care provider. Make sure you discuss any questions you have with your health care provider. Document Released: 05/10/2005 Document Revised: 04/22/2017 Document Reviewed:   06/09/2016 Elsevier Patient Education  2020 [employer] Inc[employer]  

## 2019-05-10 NOTE — Progress Notes (Addendum)
Subjective:  Patient ID: Jessica Woodward, female    DOB: 1941-01-14  Age: 78 y.o. MRN: [id number]  CC: Neck Pain and Hypertension  This visit occurred during the SARS-CoV-2 public health emergency.  Safety protocols were in place, including screening questions prior to the visit, additional usage of staff PPE, and extensive cleaning of exam room while observing appropriate contact time as indicated for disinfecting solutions.   HPI SHALAH MARIAN presents for concerns about her neck.  She complains of a 1 month history of nontraumatic neck pain (stiffness and stabbing sensation) mostly on the right side.  The pain radiates up into the back of her head and towards her right shoulder.  There is no pain that radiates into her extremities and she denies paresthesias.  She has not gotten much symptom relief with Tylenol.  Outpatient Medications Prior to Visit  Medication Sig Dispense Refill   atorvastatin (LIPITOR) 40 MG tablet Take 1 tablet (40 mg total) by mouth daily. 90 tablet 1   irbesartan-hydrochlorothiazide (AVALIDE) 150-12.5 MG tablet TAKE 1 TABLET BY MOUTH  DAILY 90 tablet 1   loratadine (CLARITIN) 10 MG tablet Take 10 mg by mouth daily.     Multiple Vitamin (MULTIVITAMIN) tablet Take 1 tablet by mouth daily.     raloxifene (EVISTA) 60 MG tablet TAKE 1 TABLET BY MOUTH  DAILY 90 tablet 1   triamcinolone cream (KENALOG) 0.1 %      No facility-administered medications prior to visit.    ROS Review of Systems  Constitutional: Negative.  Negative for chills, fatigue and fever.  HENT: Negative.  Negative for trouble swallowing.   Respiratory: Negative.  Negative for cough, chest tightness, shortness of breath and wheezing.   Cardiovascular: Negative for chest pain, palpitations and leg swelling.  Gastrointestinal: Negative for abdominal pain, blood in stool, constipation, diarrhea, nausea and vomiting.       Her complaint of melena resolved after she stopped consuming dairy  products  Endocrine: Negative.   Genitourinary: Negative.  Negative for difficulty urinating.  Musculoskeletal: Positive for neck pain. Negative for arthralgias and back pain.  Skin: Negative.  Negative for color change.  Neurological: Negative.  Negative for dizziness, weakness, numbness and headaches.  Hematological: Negative for adenopathy. Does not bruise/bleed easily.  Psychiatric/Behavioral: Negative.     Objective:  BP 120/68 (BP Location: Left Arm, Patient Position: Sitting, Cuff Size: Normal)    Pulse 68    Temp 97.8 F (36.6 C) (Oral)    Resp 16    Ht 5\' 4"  (1.626 m)    Wt 144 lb (65.3 kg)    SpO2 99%    BMI 24.72 kg/m   BP Readings from Last 3 Encounters:  05/10/19 120/68  02/22/19 128/80  12/12/18 130/70    Wt Readings from Last 3 Encounters:  05/10/19 144 lb (65.3 kg)  02/22/19 149 lb (67.6 kg)  12/12/18 147 lb (66.7 kg)    Physical Exam Vitals reviewed.  Constitutional:      Appearance: Normal appearance.  HENT:     Nose: Nose normal.  Eyes:     General: No scleral icterus.    Conjunctiva/sclera: Conjunctivae normal.  Cardiovascular:     Rate and Rhythm: Normal rate and regular rhythm.     Heart sounds: No murmur.  Pulmonary:     Effort: Pulmonary effort is normal.     Breath sounds: No stridor. No wheezing, rhonchi or rales.  Abdominal:     General: Abdomen is flat.  Palpations: There is no mass.     Tenderness: There is no abdominal tenderness.  Musculoskeletal:        General: Normal range of motion.     Cervical back: Spasms and bony tenderness present. No swelling, edema, deformity, rigidity, tenderness or crepitus. Pain with movement present.     Right lower leg: No edema.     Left lower leg: No edema.  Skin:    General: Skin is dry.  Neurological:     General: No focal deficit present.     Mental Status: She is alert.     Sensory: Sensation is intact.     Motor: Motor function is intact. No weakness.     Coordination: Coordination is  intact. Romberg sign negative. Coordination normal.     Deep Tendon Reflexes: Reflexes normal.     Reflex Scores:      Tricep reflexes are 1+ on the right side and 1+ on the left side.      Bicep reflexes are 1+ on the right side and 1+ on the left side.      Brachioradialis reflexes are 1+ on the right side and 1+ on the left side.      Patellar reflexes are 1+ on the right side and 1+ on the left side.      Achilles reflexes are 0 on the right side and 0 on the left side.    Lab Results  Component Value Date   WBC 8.5 02/22/2019   HGB 13.8 02/22/2019   HCT 40.9 02/22/2019   PLT 297.0 02/22/2019   GLUCOSE 93 02/22/2019   CHOL 161 12/12/2018   TRIG 123.0 12/12/2018   HDL 65.20 12/12/2018   LDLDIRECT 166.9 03/23/2013   LDLCALC 71 12/12/2018   ALT 13 12/12/2018   AST 17 12/12/2018   NA 140 02/22/2019   K 4.0 02/22/2019   CL 103 02/22/2019   CREATININE 0.90 02/22/2019   BUN 23 02/22/2019   CO2 27 02/22/2019   TSH 4.13 12/12/2018   HGBA1C 5.4 01/17/2012    CT Chest Wo Contrast  Result Date: 12/08/2018 CLINICAL DATA:  Follow-up lung nodules. EXAM: CT CHEST WITHOUT CONTRAST TECHNIQUE: Multidetector CT imaging of the chest was performed following the standard protocol without IV contrast. COMPARISON:  08/10/2016 FINDINGS: Cardiovascular: Heart is normal size. Minimal ectasias of the ascending thoracic aorta without significant change. Mild calcified plaque over the thoracic aorta. Minimal calcified plaque over the left anterior descending and right coronary arteries. Abdomen takeoff of the right subclavian artery coursing posterior to the esophagus. Remaining vascular structures are unremarkable. Mediastinum/Nodes: No mediastinal or hilar adenopathy. Remaining mediastinal structures are unremarkable. Lungs/Pleura: Lungs are adequately inflated with no focal airspace consolidation or effusion. Biapical pleural thickening unchanged. Minimal scarring over the lateral right lower lobe and  adjacent the lateral aspect of the right major fissure unchanged. There is subtle nodularity along the subpleural aspect of this linear scarring which is unchanged and therefore benign. No new nodules identified. Small calcified granuloma over the left upper lobe. Azygos fissure is present. Airways are normal. Upper Abdomen: 3 small liver hypodensities unchanged likely cysts. Gallbladder somewhat contracted. Calcified plaque over the abdominal aorta. Musculoskeletal: Mild degenerative change of the spine. IMPRESSION: 1.  No acute cardiopulmonary disease. 2. 2 tiny nodular opacities over the lateral aspect of the right middle lobe and associated with scarring of the lateral right lower lobe as these are unchanged from March 2018 and therefore benign. No new nodules. 3.  Aberrant  takeoff of the right subclavian artery. 4. Aortic Atherosclerosis (ICD10-I70.0). Atherosclerotic coronary artery disease. 5.  Three small stable liver hypodensities likely cysts. Electronically Signed   By: Marin Olp M.D.   On: 12/08/2018 20:59    DG Cervical Spine Complete  Result Date: 05/10/2019 CLINICAL DATA:  Right neck pain for several weeks. No reported injury. EXAM: CERVICAL SPINE - COMPLETE 4+ VIEW COMPARISON:  None. FINDINGS: On the lateral view the cervical spine is visualized to the level of C7-T1. Straightening of the cervical spine. Pre-vertebral soft tissues are within normal limits. No fracture is detected in the cervical spine. Dens is well positioned between the lateral masses of C1. Marked degenerative disc disease at C6-7. Otherwise mild-to-moderate multilevel degenerative disc disease in the mid to lower cervical spine. Minimal 2 mm anterolisthesis at C3-4, C4-5 and C5-6. Mild bilateral facet arthropathy. No significant degenerative foraminal stenosis. No aggressive-appearing focal osseous lesions. IMPRESSION: 1. Straightening of the cervical spine, usually due to positioning and/or muscle spasm. 2. Overall  moderate multilevel degenerative disc disease in the mid to lower cervical spine, most prominent at C6-7. 3. Mild cervical facet arthropathy with minimal multilevel cervical spondylolisthesis as detailed. Electronically Signed   By: Ilona Sorrel M.D.   On: 05/10/2019 13:46    Assessment & Plan:   Mlissa was seen today for neck pain and hypertension.  Diagnoses and all orders for this visit:  Essential hypertension, benign- Her blood pressure is adequately well controlled.  Stage 3a chronic kidney disease  Neck pain on right side- She has chronic neck pain with no signs of radiculopathy.  Plain films are positive for degenerative changes. -     DG Cervical Spine Complete; Future -     cyclobenzaprine (FLEXERIL) 5 MG tablet; Take 1 tablet (5 mg total) by mouth 3 (three) times daily as needed for muscle spasms.  DDD (degenerative disc disease), cervical -     cyclobenzaprine (FLEXERIL) 5 MG tablet; Take 1 tablet (5 mg total) by mouth 3 (three) times daily as needed for muscle spasms. -     meloxicam (MOBIC) 7.5 MG tablet; Take 1 tablet (7.5 mg total) by mouth daily.   I am having Jamani Crane. [place] "Sandy" start on cyclobenzaprine and meloxicam. I am also having her maintain her triamcinolone cream, multivitamin, loratadine, atorvastatin, raloxifene, and irbesartan-hydrochlorothiazide.  Meds ordered this encounter  Medications   cyclobenzaprine (FLEXERIL) 5 MG tablet    Sig: Take 1 tablet (5 mg total) by mouth 3 (three) times daily as needed for muscle spasms.    Dispense:  90 tablet    Refill:  2   meloxicam (MOBIC) 7.5 MG tablet    Sig: Take 1 tablet (7.5 mg total) by mouth daily.    Dispense:  30 tablet    Refill:  2     Follow-up: Return if symptoms worsen or fail to improve.  Scarlette Calico, MD

## 2019-05-11 ENCOUNTER — Encounter: Payer: Self-pay | Admitting: Internal Medicine

## 2019-05-14 ENCOUNTER — Telehealth: Payer: Self-pay

## 2019-05-14 DIAGNOSIS — N1831 Chronic kidney disease, stage 3a: Secondary | ICD-10-CM

## 2019-05-14 NOTE — Telephone Encounter (Signed)
Copied from [place] [id number]. Topic: General - Other >> May 14, 2019 11:56 AM Jessica Woodward wrote: Reason for CRM: Pt stated she had labs done and she would like to speak with Dr. Ronnald Ramp or his assistant about her kidney function. Pt requests call back. Cb# [phone]

## 2019-05-14 NOTE — Telephone Encounter (Signed)
Pt contacted and she is concerned about the foods that may be helping or hurting her kidney function. Referral has been entered for pt for dietician therapy.

## 2019-05-23 ENCOUNTER — Encounter: Payer: Self-pay | Admitting: Internal Medicine

## 2019-06-04 ENCOUNTER — Ambulatory Visit: Payer: Medicare Other

## 2019-06-07 ENCOUNTER — Ambulatory Visit: Payer: Medicare Other | Attending: Internal Medicine

## 2019-06-07 DIAGNOSIS — Z23 Encounter for immunization: Secondary | ICD-10-CM | POA: Diagnosis not present

## 2019-06-07 NOTE — Progress Notes (Signed)
   Covid-19 Vaccination [facility]  Name:  Jessica Woodward    MRN: [id number] DOB: August 20, 1940  06/07/2019   Pt was administered vaccine as stated below but left before discharge. RN not sure if patient walked out during observation or right after vaccination  Immunizations Administered    Name Date Dose VIS Date Route   Pfizer COVID-19 Vaccine 06/07/2019  8:26 AM 0.3 mL 05/04/2019 Intramuscular   Manufacturer: [place]   Lot: [id number]   [place]: [id number]

## 2019-06-11 ENCOUNTER — Other Ambulatory Visit: Payer: Self-pay

## 2019-06-11 ENCOUNTER — Ambulatory Visit (INDEPENDENT_AMBULATORY_CARE_PROVIDER_SITE_OTHER): Payer: Medicare Other | Admitting: Internal Medicine

## 2019-06-11 ENCOUNTER — Encounter: Payer: Self-pay | Admitting: Internal Medicine

## 2019-06-11 VITALS — BP 110/70 | HR 77 | Temp 97.8°F | Resp 16 | Ht 64.0 in | Wt 146.4 lb

## 2019-06-11 DIAGNOSIS — N1831 Chronic kidney disease, stage 3a: Secondary | ICD-10-CM | POA: Diagnosis not present

## 2019-06-11 DIAGNOSIS — I10 Essential (primary) hypertension: Secondary | ICD-10-CM | POA: Diagnosis not present

## 2019-06-11 DIAGNOSIS — M5030 Other cervical disc degeneration, unspecified cervical region: Secondary | ICD-10-CM | POA: Diagnosis not present

## 2019-06-11 LAB — BASIC METABOLIC PANEL
BUN: 18 mg/dL (ref 6–23)
CO2: 26 mEq/L (ref 19–32)
Calcium: 9.9 mg/dL (ref 8.4–10.5)
Chloride: 99 mEq/L (ref 96–112)
Creatinine, Ser: 0.81 mg/dL (ref 0.40–1.20)
GFR: 68.39 mL/min (ref >60.00)
Glucose, Bld: 97 mg/dL (ref 70–99)
Potassium: 3.5 mEq/L (ref 3.5–5.1)
Sodium: 134 mEq/L — ABNORMAL LOW (ref 135–145)

## 2019-06-11 MED ORDER — IRBESARTAN 150 MG PO TABS: 150.0000 mg | ORAL_TABLET | Freq: Every day | ORAL | 1 refills | Status: DC

## 2019-06-11 MED ORDER — IRBESARTAN 150 MG PO TABS: 150.0000 mg | ORAL_TABLET | Freq: Every day | ORAL | 0 refills | Status: DC

## 2019-06-11 MED ORDER — MELOXICAM 7.5 MG PO TABS: 7.5000 mg | ORAL_TABLET | Freq: Every day | ORAL | 0 refills | Status: DC

## 2019-06-11 NOTE — Progress Notes (Signed)
Subjective:  Patient ID: Jessica Woodward, female    DOB: 1940/12/14  Age: 79 y.o. MRN: [id number]  CC: Hypertension   This visit occurred during the SARS-CoV-2 public health emergency.  Safety protocols were in place, including screening questions prior to the visit, additional usage of staff PPE, and extensive cleaning of exam room while observing appropriate contact time as indicated for disinfecting solutions.    HPI Jessica Woodward presents for f/up - She continues to work on her lifestyle modifications with exercise and weight loss.  She complains of recent episodes of dizziness.  She denies headache, lightheadedness, chest pain, shortness of breath, palpitations, near syncope, edema, or fatigue.  Outpatient Medications Prior to Visit  Medication Sig Dispense Refill  . atorvastatin (LIPITOR) 40 MG tablet Take 1 tablet (40 mg total) by mouth daily. 90 tablet 1  . loratadine (CLARITIN) 10 MG tablet Take 10 mg by mouth daily.    . Multiple Vitamin (MULTIVITAMIN) tablet Take 1 tablet by mouth daily.    . raloxifene (EVISTA) 60 MG tablet TAKE 1 TABLET BY MOUTH  DAILY 90 tablet 1  . triamcinolone cream (KENALOG) 0.1 %     . irbesartan-hydrochlorothiazide (AVALIDE) 150-12.5 MG tablet TAKE 1 TABLET BY MOUTH  DAILY 90 tablet 1  . meloxicam (MOBIC) 7.5 MG tablet Take 1 tablet (7.5 mg total) by mouth daily. 30 tablet 2  . cyclobenzaprine (FLEXERIL) 5 MG tablet Take 1 tablet (5 mg total) by mouth 3 (three) times daily as needed for muscle spasms. (Patient not taking: Reported on 06/11/2019) 90 tablet 2   No facility-administered medications prior to visit.    ROS Review of Systems  Constitutional: Negative for diaphoresis, fatigue and unexpected weight change.  HENT: Negative.   Eyes: Negative for visual disturbance.  Respiratory: Negative for cough, chest tightness, shortness of breath and wheezing.   Cardiovascular: Negative for chest pain, palpitations and leg swelling.  Gastrointestinal:  Negative for abdominal pain, constipation, diarrhea, nausea and vomiting.  Genitourinary: Negative.  Negative for difficulty urinating, frequency and hematuria.  Musculoskeletal: Positive for arthralgias and neck pain. Negative for myalgias.  Skin: Negative.  Negative for color change.  Neurological: Positive for dizziness. Negative for weakness, light-headedness and numbness.  Hematological: Negative for adenopathy. Does not bruise/bleed easily.  Psychiatric/Behavioral: Negative.     Objective:  BP 110/70 (BP Location: Left Arm, Patient Position: Sitting, Cuff Size: Normal)   Pulse 77   Temp 97.8 F (36.6 C) (Oral)   Resp 16   Ht 5\' 4"  (1.626 m)   Wt 146 lb 6 oz (66.4 kg)   SpO2 96%   BMI 25.13 kg/m   BP Readings from Last 3 Encounters:  06/11/19 110/70  05/10/19 120/68  02/22/19 128/80    Wt Readings from Last 3 Encounters:  06/11/19 146 lb 6 oz (66.4 kg)  05/10/19 144 lb (65.3 kg)  02/22/19 149 lb (67.6 kg)    Physical Exam Vitals reviewed.  Constitutional:      Appearance: Normal appearance.  HENT:     Nose: Nose normal.     Mouth/Throat:     Mouth: Mucous membranes are moist.  Eyes:     General: No scleral icterus.    Conjunctiva/sclera: Conjunctivae normal.  Cardiovascular:     Rate and Rhythm: Normal rate and regular rhythm.     Heart sounds: No murmur.  Pulmonary:     Effort: Pulmonary effort is normal.     Breath sounds: No stridor. No wheezing, rhonchi or  rales.  Abdominal:     General: Abdomen is flat. Bowel sounds are normal.     Palpations: Abdomen is soft. There is no hepatomegaly, splenomegaly or mass.     Tenderness: There is no abdominal tenderness.  Musculoskeletal:        General: Normal range of motion.     Cervical back: Neck supple.     Right lower leg: No edema.     Left lower leg: No edema.  Lymphadenopathy:     Cervical: No cervical adenopathy.  Skin:    General: Skin is warm and dry.     Coloration: Skin is not pale.    Neurological:     General: No focal deficit present.     Mental Status: She is alert.     Lab Results  Component Value Date   WBC 8.5 02/22/2019   HGB 13.8 02/22/2019   HCT 40.9 02/22/2019   PLT 297.0 02/22/2019   GLUCOSE 97 06/11/2019   CHOL 161 12/12/2018   TRIG 123.0 12/12/2018   HDL 65.20 12/12/2018   LDLDIRECT 166.9 03/23/2013   LDLCALC 71 12/12/2018   ALT 13 12/12/2018   AST 17 12/12/2018   NA 134 (L) 06/11/2019   K 3.5 06/11/2019   CL 99 06/11/2019   CREATININE 0.81 06/11/2019   BUN 18 06/11/2019   CO2 26 06/11/2019   TSH 4.13 12/12/2018   HGBA1C 5.4 01/17/2012    DG Cervical Spine Complete  Result Date: 05/10/2019 CLINICAL DATA:  Right neck pain for several weeks. No reported injury. EXAM: CERVICAL SPINE - COMPLETE 4+ VIEW COMPARISON:  None. FINDINGS: On the lateral view the cervical spine is visualized to the level of C7-T1. Straightening of the cervical spine. Pre-vertebral soft tissues are within normal limits. No fracture is detected in the cervical spine. Dens is well positioned between the lateral masses of C1. Marked degenerative disc disease at C6-7. Otherwise mild-to-moderate multilevel degenerative disc disease in the mid to lower cervical spine. Minimal 2 mm anterolisthesis at C3-4, C4-5 and C5-6. Mild bilateral facet arthropathy. No significant degenerative foraminal stenosis. No aggressive-appearing focal osseous lesions. IMPRESSION: 1. Straightening of the cervical spine, usually due to positioning and/or muscle spasm. 2. Overall moderate multilevel degenerative disc disease in the mid to lower cervical spine, most prominent at C6-7. 3. Mild cervical facet arthropathy with minimal multilevel cervical spondylolisthesis as detailed. Electronically Signed   By: Jessica Woodward M.D.   On: 05/10/2019 13:46    Assessment & Plan:   Jessica Woodward was seen today for hypertension.  Diagnoses and all orders for this visit:  Essential hypertension, benign- Her blood  pressure is over controlled and she is symptomatic.  I recommended that she stop taking the thiazide diuretic and to stay on the current dose of the ARB. -     Discontinue: irbesartan (AVAPRO) 150 MG tablet; Take 1 tablet (150 mg total) by mouth daily. -     Basic metabolic panel -     irbesartan (AVAPRO) 150 MG tablet; Take 1 tablet (150 mg total) by mouth daily.  Stage 3a chronic kidney disease- Her renal function is stable.  She is not willing to stop taking meloxicam. -     Basic metabolic panel  DDD (degenerative disc disease), cervical -     Discontinue: meloxicam (MOBIC) 7.5 MG tablet; Take 1 tablet (7.5 mg total) by mouth daily. -     meloxicam (MOBIC) 7.5 MG tablet; Take 1 tablet (7.5 mg total) by mouth daily.  I have discontinued Denyce Addicks. Garron "Sandy"'s irbesartan-hydrochlorothiazide and cyclobenzaprine. I am also having her maintain her triamcinolone cream, multivitamin, loratadine, atorvastatin, raloxifene, irbesartan, and meloxicam.  Meds ordered this encounter  Medications  . DISCONTD: meloxicam (MOBIC) 7.5 MG tablet    Sig: Take 1 tablet (7.5 mg total) by mouth daily.    Dispense:  90 tablet    Refill:  0  . DISCONTD: irbesartan (AVAPRO) 150 MG tablet    Sig: Take 1 tablet (150 mg total) by mouth daily.    Dispense:  90 tablet    Refill:  1  . irbesartan (AVAPRO) 150 MG tablet    Sig: Take 1 tablet (150 mg total) by mouth daily.    Dispense:  30 tablet    Refill:  0  . meloxicam (MOBIC) 7.5 MG tablet    Sig: Take 1 tablet (7.5 mg total) by mouth daily.    Dispense:  30 tablet    Refill:  0     Follow-up: Return in about 6 months (around 12/09/2019).  Scarlette Calico, MD

## 2019-06-11 NOTE — Patient Instructions (Signed)

## 2019-06-12 ENCOUNTER — Encounter: Payer: Medicare Other | Attending: Internal Medicine | Admitting: Skilled Nursing Facility1

## 2019-06-12 ENCOUNTER — Encounter: Payer: Self-pay | Admitting: Skilled Nursing Facility1

## 2019-06-12 DIAGNOSIS — N1831 Chronic kidney disease, stage 3a: Secondary | ICD-10-CM | POA: Insufficient documentation

## 2019-06-12 NOTE — Progress Notes (Signed)
  Assessment:  Primary concerns today: stage 3 kidney disease.   Pt states her husband passed away in Nov 24, 2022 due to a sickness and has been needing to get her mothers affairs in order. Pt states she does have reactive hypoglycemia.  Pt states she has been avoiding pretzels due to the salt content.  Due to pts kidney labs being within normal limits currently and in the past pt believes her kidney function panel was out of range due to not taking care of herself/not drinking enough water from grieving her husband.    MEDICATIONS: see list   DIETARY INTAKE:  Usual eating pattern includes 3 meals and 0 snacks per day.  Everyday foods include none stated.  Avoided foods include foods thought to damage kidneys.    24-hr recall:  B ( AM): oatmeal with cinnomonwith fruit Snk ( AM):  L ( PM): salad and hard boiled egg or grilled chicken or tuna salad  Snk ( PM): prtzels or popcocrn or chese stick D ( PM): baked fish or chicken with cauliflower or broccoli or peas   Snk ( PM):  Beverages: water, black coffee, lactacid 2% milk, iced tea   Usual physical activity: aerobics 45 minutes 4-5 times a week with weight bearing exercises in it       Intervention:  Nutrition counseling. Dietitian educated pt on her labs being WNL and her needing not to cut foods out. Dietitian reinforced pts already healthy habits and answered her questions to her satisfaction.   Teaching Method Utilized:  Visual Auditory Hands on   Barriers to learning/adherence to lifestyle change: misinformation from random Internet sources   Demonstrated degree of understanding via:  Teach Back   Monitoring/Evaluation:  Dietary intake, exercise, and body weight

## 2019-06-26 ENCOUNTER — Ambulatory Visit: Payer: Medicare Other | Attending: Internal Medicine

## 2019-06-26 DIAGNOSIS — Z23 Encounter for immunization: Secondary | ICD-10-CM | POA: Insufficient documentation

## 2019-06-26 NOTE — Progress Notes (Signed)
   Covid-19 Vaccination Clinic  Name:  Jessica Woodward    MRN: [id number] DOB: 1941-03-31  06/26/2019  [name] was observed post Covid-19 immunization for 15 minutes without incidence. She was provided with Vaccine Information Sheet and instruction to access the V-Safe system.   [name] was instructed to call 911 with any severe reactions post vaccine: Marland Kitchen Difficulty breathing  . Swelling of your face and throat  . A fast heartbeat  . A bad rash all over your body  . Dizziness and weakness    Immunizations Administered    Name Date Dose VIS Date Route   Pfizer COVID-19 Vaccine 06/26/2019  8:52 AM 0.3 mL 05/04/2019 Intramuscular   Manufacturer: [place]   Lot: [id number]   [place]: [id number]

## 2019-06-27 ENCOUNTER — Encounter: Payer: Self-pay | Admitting: Internal Medicine

## 2019-07-09 ENCOUNTER — Other Ambulatory Visit: Payer: Self-pay | Admitting: Internal Medicine

## 2019-07-09 DIAGNOSIS — D0500 Lobular carcinoma in situ of unspecified breast: Secondary | ICD-10-CM

## 2019-07-09 DIAGNOSIS — E785 Hyperlipidemia, unspecified: Secondary | ICD-10-CM

## 2019-07-30 ENCOUNTER — Ambulatory Visit
Admission: RE | Admit: 2019-07-30 | Discharge: 2019-07-30 | Disposition: A | Discharge status: Home / Self Care | Payer: Medicare Other | Source: Ambulatory Visit | Attending: Internal Medicine | Admitting: Internal Medicine

## 2019-07-30 ENCOUNTER — Other Ambulatory Visit: Payer: Self-pay

## 2019-07-30 DIAGNOSIS — Z1231 Encounter for screening mammogram for malignant neoplasm of breast: Secondary | ICD-10-CM | POA: Diagnosis not present

## 2019-07-31 LAB — HM MAMMOGRAPHY

## 2019-08-12 ENCOUNTER — Other Ambulatory Visit: Payer: Self-pay | Admitting: Internal Medicine

## 2019-08-12 DIAGNOSIS — M5030 Other cervical disc degeneration, unspecified cervical region: Secondary | ICD-10-CM

## 2019-08-31 DIAGNOSIS — M25562 Pain in left knee: Secondary | ICD-10-CM | POA: Diagnosis not present

## 2019-09-24 DIAGNOSIS — M25562 Pain in left knee: Secondary | ICD-10-CM | POA: Diagnosis not present

## 2019-10-11 ENCOUNTER — Ambulatory Visit: Payer: Medicare Other

## 2019-10-20 ENCOUNTER — Other Ambulatory Visit: Payer: Self-pay | Admitting: Internal Medicine

## 2019-10-20 DIAGNOSIS — I10 Essential (primary) hypertension: Secondary | ICD-10-CM

## 2019-11-05 ENCOUNTER — Encounter: Payer: Self-pay | Admitting: Internal Medicine

## 2019-11-06 ENCOUNTER — Other Ambulatory Visit: Payer: Self-pay

## 2019-11-06 ENCOUNTER — Telehealth (INDEPENDENT_AMBULATORY_CARE_PROVIDER_SITE_OTHER): Payer: Medicare Other | Admitting: Family

## 2019-11-06 DIAGNOSIS — J019 Acute sinusitis, unspecified: Secondary | ICD-10-CM

## 2019-11-06 DIAGNOSIS — M5030 Other cervical disc degeneration, unspecified cervical region: Secondary | ICD-10-CM

## 2019-11-06 MED ORDER — CYCLOBENZAPRINE HCL 5 MG PO TABS: 5.0000 mg | ORAL_TABLET | Freq: Three times a day (TID) | ORAL | 2 refills | Status: DC | PRN

## 2019-11-06 MED ORDER — CEFDINIR 300 MG PO CAPS: 300.0000 mg | ORAL_CAPSULE | Freq: Two times a day (BID) | ORAL | 0 refills | Status: DC

## 2019-11-06 MED ORDER — BENZONATATE 200 MG PO CAPS: 200.0000 mg | ORAL_CAPSULE | Freq: Three times a day (TID) | ORAL | 0 refills | Status: DC | PRN

## 2019-11-06 NOTE — Progress Notes (Signed)
Jessica Woodward is a 79 y.o. female with the following history as recorded in [employer]:  Patient Active Problem List   Diagnosis Date Noted  . DDD (degenerative disc disease), cervical 05/10/2019  . Chronic renal disease, stage 3, moderately decreased glomerular filtration rate (GFR) between 30-59 mL/min/1.73 square meter 12/12/2018  . Seasonal allergic rhinitis due to pollen 02/15/2017  . Bilateral sensorineural hearing loss 12/27/2016  . Nodule of right lung 01/19/2016  . Costochondritis 12/03/2014  . Essential hypertension, benign 09/11/2013  . Routine health maintenance 01/18/2012  . Dyslipidemia, goal LDL below 130 01/17/2012  . DUCTAL CARCINOMA IN SITU, LEFT BREAST 06/05/2007    Current Outpatient Medications  Medication Sig Dispense Refill  . atorvastatin (LIPITOR) 40 MG tablet TAKE 1 TABLET BY MOUTH  DAILY 90 tablet 1  . irbesartan (AVAPRO) 150 MG tablet TAKE 1 TABLET BY MOUTH  DAILY 90 tablet 0  . loratadine (CLARITIN) 10 MG tablet Take 10 mg by mouth daily.    . meloxicam (MOBIC) 7.5 MG tablet TAKE 1 TABLET BY MOUTH  DAILY 90 tablet 1  . Multiple Vitamin (MULTIVITAMIN) tablet Take 1 tablet by mouth daily.    . raloxifene (EVISTA) 60 MG tablet TAKE 1 TABLET BY MOUTH  DAILY 90 tablet 3  . triamcinolone cream (KENALOG) 0.1 %     . benzonatate (TESSALON) 200 MG capsule Take 1 capsule (200 mg total) by mouth 3 (three) times daily as needed. 30 capsule 0  . cefdinir (OMNICEF) 300 MG capsule Take 1 capsule (300 mg total) by mouth 2 (two) times daily. 20 capsule 0  . cyclobenzaprine (FLEXERIL) 5 MG tablet Take 1 tablet (5 mg total) by mouth 3 (three) times daily as needed for muscle spasms. 90 tablet 2   No current facility-administered medications for this visit.    Allergies: Lisinopril, Sulfasalazine, Sulfonamide derivatives, and Arimidex [anastrozole]  Past Medical History:  Diagnosis Date  . Arthritis   . Breast disorder    cancer  . CARCINOMA, SQUAMOUS CELL, HX OF  06/05/2007  . DUCTAL CARCINOMA IN SITU, LEFT BREAST 2002   s/p left lumpectomy, xrt and tamoxifen  . History of chemotherapy   . Hypertension   . Personal history of chemotherapy 2002  . Personal history of radiation therapy 2002  . RADIATION THERAPY, HX OF   . Seasonal allergies   . Skin cancer     Past Surgical History:  Procedure Laterality Date  . ABDOMINAL HYSTERECTOMY    . athroscopy right knee  2013   torn miniscus  . BREAST BIOPSY Left 2002  . BREAST EXCISIONAL BIOPSY Right   . BREAST LUMPECTOMY Left 2002   had chemo and radiation  . BREAST SURGERY     Left breast lumpectomy with axillary sential node  . DILATION AND CURETTAGE OF UTERUS    . Left knee surgery    . MOHS SURGERY     (R) arm  . right breast lupectomy    . TONSILLECTOMY AND ADENOIDECTOMY    . TUBAL LIGATION      Family History  Problem Relation Age of Onset  . Coronary artery disease Mother   . Dementia Mother   . Macular degeneration Father   . Coronary artery disease Father   . Diabetes Father   . Cancer Brother        pancreatic  . Heart disease Maternal Grandmother   . Kidney disease Maternal Grandmother   . Heart disease Maternal Grandfather   . Heart disease Paternal Grandmother   .  Diabetes Paternal Grandmother   . Heart disease Paternal Grandfather   . Hypertension Son   . Heart attack Son   . Hypertension Son     Social History   Tobacco Use  . Smoking status: Former Smoker    Types: Cigarettes    Quit date: 12/28/1963    Years since quitting: 55.8  . Smokeless tobacco: Never Used  Substance Use Topics  . Alcohol use: Yes    Comment: rare glass of wine    Subjective:   I connected with [name] on 11/06/19 at 10:40 AM EDT by a video enabled telemedicine application and verified that I am speaking with the correct person using two identifiers.   I discussed the limitations of evaluation and management by telemedicine and the availability of in person appointments. The  patient expressed understanding and agreed to proceed. Provider in office/ patient is at home; provider and patient are only 2 people on video call.   Concern for sinus infection; feels like symptoms are moving into chest; using OTC Benadryl, chloraseptic, Tylenol;  + congestion; + facial pressure/ teeth feel full; + productive cough;  Has had her COVID vaccines;   Objective:  There were no vitals filed for this visit.  General: Well developed, well nourished, in no acute distress  Head: Normocephalic and atraumatic  Lungs: Respirations unlabored;  Neurologic: Alert and oriented; speech intact; face symmetrical;   Assessment:  1. Acute sinusitis, recurrence not specified, unspecified location   2. DDD (degenerative disc disease), cervical     Plan:  1. Rx for Omnicef 300 mg bid x 10 days; Tessalon perles 200 mg tid prn; increase fluids, rest and follow-up worse, no better. 2. Prescription updated for Flexeril 5 mg to use tid prn;   No follow-ups on file.  No orders of the defined types were placed in this encounter.   Requested Prescriptions   Signed Prescriptions Disp Refills  . cyclobenzaprine (FLEXERIL) 5 MG tablet 90 tablet 2    Sig: Take 1 tablet (5 mg total) by mouth 3 (three) times daily as needed for muscle spasms.  . benzonatate (TESSALON) 200 MG capsule 30 capsule 0    Sig: Take 1 capsule (200 mg total) by mouth 3 (three) times daily as needed.  . cefdinir (OMNICEF) 300 MG capsule 20 capsule 0    Sig: Take 1 capsule (300 mg total) by mouth 2 (two) times daily.

## 2019-11-12 ENCOUNTER — Ambulatory Visit (INDEPENDENT_AMBULATORY_CARE_PROVIDER_SITE_OTHER): Payer: Medicare Other

## 2019-11-12 DIAGNOSIS — Z0000 Encounter for general adult medical examination without abnormal findings: Secondary | ICD-10-CM | POA: Diagnosis not present

## 2019-11-12 NOTE — Progress Notes (Signed)
I connected with Jessica Woodward. Innocent today by telephone and verified that I am speaking with the correct person using two identifiers. Location patient: home Location provider: work Persons participating in the virtual visit: Avyanna Spada. [place] and [place] Ioma Chismar, LPN  I discussed the limitations, risks, security and privacy concerns of performing an evaluation and management service by telephone and the availability of in person appointments. I also discussed with the patient that there may be a patient responsible charge related to this service. The patient expressed understanding and verbally consented to this telephonic visit.    Interactive audio and video telecommunications were attempted between this provider and patient, however failed, due to patient having technical difficulties OR patient did not have access to video capability.  We continued and completed visit with audio only.  Some vital signs may be absent or patient reported.   Time Spent with patient on telephone encounter: 25 minutes  Subjective:   Jessica Woodward is a 79 y.o. female who presents for Medicare Annual (Subsequent) preventive examination.  Review of Systems    No ROS. Medicare Wellness Virtual Visit. Additional risk factors are reflected in social history. Cardiac Risk Factors include: advanced age (>11men, >23 women);dyslipidemia;family history of premature cardiovascular disease;hypertension     Objective:    There were no vitals filed for this visit. There is no height or weight on file to calculate BMI.  Advanced Directives 11/12/2019 10/10/2018 10/05/2017 04/20/2016  Does Patient Have a Medical Advance Directive? Yes Yes Yes No;Yes  Type of Paramedic of [place];Living will [place];Living will [place];Living will [place];Living will  Does patient want to make changes to medical advance directive? No - Patient  declined - - Yes (Inpatient - patient requests chaplain consult to change a medical advance directive)  Copy of [place] in Chart? No - copy requested No - copy requested No - copy requested Yes    Current Medications (verified) Outpatient Encounter Medications as of 11/12/2019  Medication Sig  . atorvastatin (LIPITOR) 40 MG tablet TAKE 1 TABLET BY MOUTH  DAILY  . benzonatate (TESSALON) 200 MG capsule Take 1 capsule (200 mg total) by mouth 3 (three) times daily as needed.  . cefdinir (OMNICEF) 300 MG capsule Take 1 capsule (300 mg total) by mouth 2 (two) times daily.  . cyclobenzaprine (FLEXERIL) 5 MG tablet Take 1 tablet (5 mg total) by mouth 3 (three) times daily as needed for muscle spasms.  . irbesartan (AVAPRO) 150 MG tablet TAKE 1 TABLET BY MOUTH  DAILY  . loratadine (CLARITIN) 10 MG tablet Take 10 mg by mouth daily.  . meloxicam (MOBIC) 7.5 MG tablet TAKE 1 TABLET BY MOUTH  DAILY  . Multiple Vitamin (MULTIVITAMIN) tablet Take 1 tablet by mouth daily.  . raloxifene (EVISTA) 60 MG tablet TAKE 1 TABLET BY MOUTH  DAILY  . triamcinolone cream (KENALOG) 0.1 %   . [DISCONTINUED] meloxicam (MOBIC) 7.5 MG tablet Take 1 tablet (7.5 mg total) by mouth daily.   No facility-administered encounter medications on file as of 11/12/2019.    Allergies (verified) Lisinopril, Sulfasalazine, Sulfonamide derivatives, and Arimidex [anastrozole]   History: Past Medical History:  Diagnosis Date  . Arthritis   . Breast disorder    cancer  . CARCINOMA, SQUAMOUS CELL, HX OF 06/05/2007  . DUCTAL CARCINOMA IN SITU, LEFT BREAST 2002   s/p left lumpectomy, xrt and tamoxifen  . History of chemotherapy   .  Hypertension   . Personal history of chemotherapy 2002  . Personal history of radiation therapy 2002  . RADIATION THERAPY, HX OF   . Seasonal allergies   . Skin cancer    Past Surgical History:  Procedure Laterality Date  . ABDOMINAL HYSTERECTOMY    . athroscopy right knee  2013     torn miniscus  . BREAST BIOPSY Left 2002  . BREAST EXCISIONAL BIOPSY Right   . BREAST LUMPECTOMY Left 2002   had chemo and radiation  . BREAST SURGERY     Left breast lumpectomy with axillary sential node  . DILATION AND CURETTAGE OF UTERUS    . Left knee surgery    . MOHS SURGERY     (R) arm  . right breast lupectomy    . TONSILLECTOMY AND ADENOIDECTOMY    . TUBAL LIGATION     Family History  Problem Relation Age of Onset  . Coronary artery disease Mother   . Dementia Mother   . Macular degeneration Father   . Coronary artery disease Father   . Diabetes Father   . Cancer Brother        pancreatic  . Heart disease Maternal Grandmother   . Kidney disease Maternal Grandmother   . Heart disease Maternal Grandfather   . Heart disease Paternal Grandmother   . Diabetes Paternal Grandmother   . Heart disease Paternal Grandfather   . Hypertension Son   . Heart attack Son   . Hypertension Son    Social History   Socioeconomic History  . Marital status: Widowed    Spouse name: Not on file  . Number of children: 2  . Years of education: Not on file  . Highest education level: Not on file  Occupational History  . Occupation: retired  Tobacco Use  . Smoking status: Former Smoker    Types: Cigarettes    Quit date: 12/28/1963    Years since quitting: 55.9  . Smokeless tobacco: Never Used  Vaping Use  . Vaping Use: Never used  Substance and Sexual Activity  . Alcohol use: Not Currently    Comment: rare glass of wine  . Drug use: No  . Sexual activity: Not Currently    Partners: Male    Birth control/protection: Surgical    Comment: hyst  Other Topics Concern  . Not on file  Social History Narrative   HSG, [employer] college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 married '60. 2 boys - '62, '73; 11 grandchildren; 3 great-grandchildren. Retired.   Social Determinants of Health   Financial Resource Strain:   . Difficulty of Paying Living Expenses:   Food Insecurity:   . Worried  About Charity fundraiser in the Last Year:   . Arboriculturist in the Last Year:   Transportation Needs:   . Film/video editor (Medical):   Marland Kitchen Lack of Transportation (Non-Medical):   Physical Activity:   . Days of Exercise per Week:   . Minutes of Exercise per Session:   Stress:   . Feeling of Stress :   Social Connections:   . Frequency of Communication with Friends and Family:   . Frequency of Social Gatherings with Friends and Family:   . Attends Religious Services:   . Active Member of Clubs or Organizations:   . Attends Archivist Meetings:   Marland Kitchen Marital Status:     Tobacco Counseling Counseling given: Not Answered   Clinical Intake:  Pre-visit preparation completed: Yes  Pain :  No/denies pain     Nutritional Risks: None Diabetes: No  How often do you need to have someone help you when you read instructions, pamphlets, or other written materials from your doctor or pharmacy?: 1 - Never What is the last grade level you completed in school?: Associates Degree  Diabetic? No  Interpreter Needed?: No  Information entered by :: Roben Tatsch N. Askia Hazelip, LPN   Activities of Daily Living In your present state of health, do you have any difficulty performing the following activities: 11/12/2019  Hearing? Y  Vision? N  Difficulty concentrating or making decisions? N  Walking or climbing stairs? N  Dressing or bathing? N  Doing errands, shopping? N  Preparing Food and eating ? N  Using the Toilet? N  In the past six months, have you accidently leaked urine? N  Do you have problems with loss of bowel control? N  Managing your Medications? N  Managing your Finances? N  Housekeeping or managing your Housekeeping? N  Some recent data might be hidden    Patient Care Team: Janith Lima, MD as PCP - General (Internal Medicine) Estill Dooms, NP as PCP - OBGYN (Obstetrics and Gynecology) Magrinat, Virgie Dad, MD (Hematology and Oncology) Sydnee Cabal, MD (Orthopedic Surgery) Rutherford Guys, MD (Ophthalmology) Rolm Bookbinder, MD (Dermatology)  Indicate any recent Medical Services you may have received from other than [facility] providers in the past year (date may be approximate).     Assessment:   This is a routine wellness examination for Jessica Woodward.  Hearing/Vision screen No exam data present  Dietary issues and exercise activities discussed: Current Exercise Habits: Home exercise routine, Type of exercise: walking;Other - see comments (aerobic exercises), Time (Minutes): 30, Frequency (Times/Week): 5, Weekly Exercise (Minutes/Week): 150, Intensity: Moderate, Exercise limited by: None identified  Goals    .  patient      Continue to travel;  Small motor home and will take off     .  Patient Stated      Maintain current health status, enjoy life and family. Travel in our motor home as much as possible.    .  Patient Stated (pt-stated)      Continue to maintain her weight and be more active.      Depression Screen PHQ 2/9 Scores 11/12/2019 06/11/2019 10/10/2018 10/05/2017 07/15/2017 04/20/2016 12/25/2015  PHQ - 2 Score 0 0 0 0 0 0 0    Fall Risk Fall Risk  11/12/2019 06/12/2019 06/11/2019 10/10/2018 10/05/2017  Falls in the past year? 0 0 0 0 No  Comment - - - - -  Number falls in past yr: 0 - 0 0 -  Injury with Fall? 0 - 0 - -  Risk for fall due to : No Fall Risks - - - -  Follow up Falls evaluation completed - Falls evaluation completed - -    Any stairs in or around the home? Yes  If so, are there any without handrails? Yes  Home free of loose throw rugs in walkways, pet beds, electrical cords, etc? Yes  Adequate lighting in your home to reduce risk of falls? Yes   ASSISTIVE DEVICES UTILIZED TO PREVENT FALLS:  Life alert? Yes  Use of a cane, walker or w/c? No  Grab bars in the bathroom? No  Shower chair or bench in shower? Yes  Elevated toilet seat or a handicapped toilet? No   TIMED UP AND GO:  Was the test performed?  No .  Length of  time to ambulate 10 feet: 0 sec.   Gait steady and fast without use of assistive device not indicated  Cognitive Function: MMSE - Mini Mental State Exam 12/25/2015  Not completed: (No Data)     6CIT Screen 11/12/2019  What Year? 0 points  What month? 0 points  What time? 0 points  Count back from 20 0 points  Months in reverse 0 points  Repeat phrase 0 points  Total Score 0    Immunizations Immunization History  Administered Date(s) Administered  . Fluad Quad(high Dose 65+) 02/14/2019  . Influenza Whole 03/17/2009, 02/28/2012  . Influenza, High Dose Seasonal PF 02/17/2015, 02/12/2016, 02/01/2017, 01/31/2018  . Influenza,inj,Quad PF,6+ Mos 02/27/2013, 03/20/2014  . Influenza-Unspecified 02/17/2015  . PFIZER SARS-COV-2 Vaccination 06/07/2019, 06/26/2019  . Pneumococcal Conjugate-13 03/28/2013  . Pneumococcal Polysaccharide-23 08/23/2004, 01/07/2010, 04/15/2015  . Td 07/08/1998, 01/07/2010  . Zoster 03/14/2006    TDAP status: Up to date Flu Vaccine status: Up to date Pneumococcal vaccine status: Up to date Covid-19 vaccine status: Completed vaccines  Qualifies for Shingles Vaccine? Yes   Zostavax completed Yes   Shingrix Completed?: No.    Education has been provided regarding the importance of this vaccine. Patient has been advised to call insurance company to determine out of pocket expense if they have not yet received this vaccine. Advised may also receive vaccine at local pharmacy or Health Dept. Verbalized acceptance and understanding.  Screening Tests Health Maintenance  Topic Date Due  . Hepatitis C Screening  Never done  . INFLUENZA VACCINE  12/23/2019  . TETANUS/TDAP  01/08/2020  . DEXA SCAN  Completed  . COVID-19 Vaccine  Completed  . PNA vac Low Risk Adult  Completed    Health Maintenance  Health Maintenance Due  Topic Date Due  . Hepatitis C Screening  Never done    Colorectal cancer screening: No longer required.  Mammogram  status: Completed 07/31/2019. Repeat every year Bone Density status: Completed 01/31/2018. Results reflect: Bone density results: NORMAL. Repeat every 2-3 years.  Lung Cancer Screening: (Low Dose CT Chest recommended if Age 53-80 years, 30 pack-year currently smoking OR have quit w/in 15years.) does not qualify.   Lung Cancer Screening Referral: No  Additional Screening:  Hepatitis C Screening: does not qualify; Completed: No  Vision Screening: Recommended annual ophthalmology exams for early detection of glaucoma and other disorders of the eye. Is the patient up to date with their annual eye exam?  Yes  Who is the provider or what is the name of the office in which the patient attends annual eye exams? [facility] If pt is not established with a provider, would they like to be referred to a provider to establish care? No .   Dental Screening: Recommended annual dental exams for proper oral hygiene  Community Resource Referral / Chronic Care Management: CRR required this visit?  No   CCM required this visit?  No      Plan:     Reviewed health maintenance screenings with patient today and relevant education, vaccines, and/or referrals were provided.    Continue doing brain stimulating activities (puzzles, reading, adult coloring books, staying active) to keep memory sharp.    Continue to eat heart healthy diet (full of fruits, vegetables, whole grains, lean protein, water--limit salt, fat, and sugar intake) and increase physical activity as tolerated.  I have personally reviewed and noted the following in the patient's chart:   . Medical and social history . Use of alcohol, tobacco or  illicit drugs  . Current medications and supplements . Functional ability and status . Nutritional status . Physical activity . Advanced directives . List of other physicians . Hospitalizations, surgeries, and ER visits in previous 12 months . Vitals . Screenings to include cognitive,  depression, and falls . Referrals and appointments  In addition, I have reviewed and discussed with patient certain preventive protocols, quality metrics, and best practice recommendations. A written personalized care plan for preventive services as well as general preventive health recommendations were provided to patient.     Sheral Flow, LPN   [id number]   Nurse Notes:  There were no vitals filed for this visit.  There is no height or weight on file to calculate BMI. Goes to dentist every 6 months for cleanings.

## 2019-11-12 NOTE — Patient Instructions (Signed)
Jessica Woodward , Thank you for taking time to come for your Medicare Wellness Visit. I appreciate your ongoing commitment to your health goals. Please review the following plan we discussed and let me know if I can assist you in the future.   Screening recommendations/referrals: Colonoscopy: last done 10/06/2011; due 09/2021 or no repeat due to age or no issues with GI Mammogram: last done 07/31/2019; due every year Bone Density: last done 01/31/2018; due every 2-3 years Recommended yearly ophthalmology/optometry visit for glaucoma screening and checkup Recommended yearly dental visit for hygiene and checkup  Vaccinations: Influenza vaccine: 02/14/2019; due every year Pneumococcal vaccine: completed Tdap vaccine: 01/07/2010; due every 10 years; due 12/2019 Shingles vaccine: never done   Covid-19: completed Zoster: 03/14/2006  Advanced directives: Please bring a copy of your health care power of attorney and living will to the office at your convenience.  Conditions/risks identified: Please continue to do your personal lifestyle choices by: daily care of teeth and gums, regular physical activity (goal should be 5 days a week for 30 minutes), eat a healthy diet, avoid tobacco and drug use, limiting any alcohol intake, taking a low-dose aspirin (if not allergic or have been advised by your provider otherwise) and taking vitamins and minerals as recommended by your provider. Continue doing brain stimulating activities (puzzles, reading, adult coloring books, staying active) to keep memory sharp. Continue to eat heart healthy diet (full of fruits, vegetables, whole grains, lean protein, water--limit salt, fat, and sugar intake) and increase physical activity as tolerated.  Next appointment: Please schedule your next Medicare Wellness Visit with your Nurse Health Advisor in 1 year.   Preventive Care 78 Years and Older, Female Preventive care refers to lifestyle choices and visits with your health care  provider that can promote health and wellness. What does preventive care include?  A yearly physical exam. This is also called an annual well check.  Dental exams once or twice a year.  Routine eye exams. Ask your health care provider how often you should have your eyes checked.  Personal lifestyle choices, including:  Daily care of your teeth and gums.  Regular physical activity.  Eating a healthy diet.  Avoiding tobacco and drug use.  Limiting alcohol use.  Practicing safe sex.  Taking low-dose aspirin every day.  Taking vitamin and mineral supplements as recommended by your health care provider. What happens during an annual well check? The services and screenings done by your health care provider during your annual well check will depend on your age, overall health, lifestyle risk factors, and family history of disease. Counseling  Your health care provider may ask you questions about your:  Alcohol use.  Tobacco use.  Drug use.  Emotional well-being.  Home and relationship well-being.  Sexual activity.  Eating habits.  History of falls.  Memory and ability to understand (cognition).  Work and work Statistician.  Reproductive health. Screening  You may have the following tests or measurements:  Height, weight, and BMI.  Blood pressure.  Lipid and cholesterol levels. These may be checked every 5 years, or more frequently if you are over 59 years old.  Skin check.  Lung cancer screening. You may have this screening every year starting at age 62 if you have a 30-pack-year history of smoking and currently smoke or have quit within the past 15 years.  Fecal occult blood test (FOBT) of the stool. You may have this test every year starting at age 55.  Flexible sigmoidoscopy or colonoscopy. You  may have a sigmoidoscopy every 5 years or a colonoscopy every 10 years starting at age 81.  Hepatitis C blood test.  Hepatitis B blood test.  Sexually  transmitted disease (STD) testing.  Diabetes screening. This is done by checking your blood sugar (glucose) after you have not eaten for a while (fasting). You may have this done every 1-3 years.  Bone density scan. This is done to screen for osteoporosis. You may have this done starting at age 49.  Mammogram. This may be done every 1-2 years. Talk to your health care provider about how often you should have regular mammograms. Talk with your health care provider about your test results, treatment options, and if necessary, the need for more tests. Vaccines  Your health care provider may recommend certain vaccines, such as:  Influenza vaccine. This is recommended every year.  Tetanus, diphtheria, and acellular pertussis (Tdap, Td) vaccine. You may need a Td booster every 10 years.  Zoster vaccine. You may need this after age 61.  Pneumococcal 13-valent conjugate (PCV13) vaccine. One dose is recommended after age 29.  Pneumococcal polysaccharide (PPSV23) vaccine. One dose is recommended after age 61. Talk to your health care provider about which screenings and vaccines you need and how often you need them. This information is not intended to replace advice given to you by your health care provider. Make sure you discuss any questions you have with your health care provider. Document Released: 06/06/2015 Document Revised: 01/28/2016 Document Reviewed: 03/11/2015 Elsevier Interactive Patient Education  2017 [place] Prevention in the Home Falls can cause injuries. They can happen to people of all ages. There are many things you can do to make your home safe and to help prevent falls. What can I do on the outside of my home?  Regularly fix the edges of walkways and driveways and fix any cracks.  Remove anything that might make you trip as you walk through a door, such as a raised step or threshold.  Trim any bushes or trees on the path to your home.  Use bright outdoor  lighting.  Clear any walking paths of anything that might make someone trip, such as rocks or tools.  Regularly check to see if handrails are loose or broken. Make sure that both sides of any steps have handrails.  Any raised decks and porches should have guardrails on the edges.  Have any leaves, snow, or ice cleared regularly.  Use sand or salt on walking paths during winter.  Clean up any spills in your garage right away. This includes oil or grease spills. What can I do in the bathroom?  Use night lights.  Install grab bars by the toilet and in the tub and shower. Do not use towel bars as grab bars.  Use non-skid mats or decals in the tub or shower.  If you need to sit down in the shower, use a plastic, non-slip stool.  Keep the floor dry. Clean up any water that spills on the floor as soon as it happens.  Remove soap buildup in the tub or shower regularly.  Attach bath mats securely with double-sided non-slip rug tape.  Do not have throw rugs and other things on the floor that can make you trip. What can I do in the bedroom?  Use night lights.  Make sure that you have a light by your bed that is easy to reach.  Do not use any sheets or blankets that are too big for  your bed. They should not hang down onto the floor.  Have a firm chair that has side arms. You can use this for support while you get dressed.  Do not have throw rugs and other things on the floor that can make you trip. What can I do in the kitchen?  Clean up any spills right away.  Avoid walking on wet floors.  Keep items that you use a lot in easy-to-reach places.  If you need to reach something above you, use a strong step stool that has a grab bar.  Keep electrical cords out of the way.  Do not use floor polish or wax that makes floors slippery. If you must use wax, use non-skid floor wax.  Do not have throw rugs and other things on the floor that can make you trip. What can I do with my  stairs?  Do not leave any items on the stairs.  Make sure that there are handrails on both sides of the stairs and use them. Fix handrails that are broken or loose. Make sure that handrails are as long as the stairways.  Check any carpeting to make sure that it is firmly attached to the stairs. Fix any carpet that is loose or worn.  Avoid having throw rugs at the top or bottom of the stairs. If you do have throw rugs, attach them to the floor with carpet tape.  Make sure that you have a light switch at the top of the stairs and the bottom of the stairs. If you do not have them, ask someone to add them for you. What else can I do to help prevent falls?  Wear shoes that:  Do not have high heels.  Have rubber bottoms.  Are comfortable and fit you well.  Are closed at the toe. Do not wear sandals.  If you use a stepladder:  Make sure that it is fully opened. Do not climb a closed stepladder.  Make sure that both sides of the stepladder are locked into place.  Ask someone to hold it for you, if possible.  Clearly mark and make sure that you can see:  Any grab bars or handrails.  First and last steps.  Where the edge of each step is.  Use tools that help you move around (mobility aids) if they are needed. These include:  Canes.  Walkers.  Scooters.  Crutches.  Turn on the lights when you go into a dark area. Replace any light bulbs as soon as they burn out.  Set up your furniture so you have a clear path. Avoid moving your furniture around.  If any of your floors are uneven, fix them.  If there are any pets around you, be aware of where they are.  Review your medicines with your doctor. Some medicines can make you feel dizzy. This can increase your chance of falling. Ask your doctor what other things that you can do to help prevent falls. This information is not intended to replace advice given to you by your health care provider. Make sure you discuss any  questions you have with your health care provider. Document Released: 03/06/2009 Document Revised: 10/16/2015 Document Reviewed: 06/14/2014 Elsevier Interactive Patient Education  2017 [employer].

## 2019-12-10 ENCOUNTER — Ambulatory Visit: Payer: Medicare Other | Admitting: Internal Medicine

## 2019-12-11 ENCOUNTER — Emergency Department (HOSPITAL_BASED_OUTPATIENT_CLINIC_OR_DEPARTMENT_OTHER): Payer: Medicare Other

## 2019-12-11 ENCOUNTER — Other Ambulatory Visit: Payer: Self-pay

## 2019-12-11 ENCOUNTER — Ambulatory Visit (HOSPITAL_COMMUNITY)
Admission: EM | Disposition: A | Discharge status: Home / Self Care | Payer: Self-pay | Source: Home / Self Care | Attending: Emergency Medicine

## 2019-12-11 ENCOUNTER — Emergency Department (HOSPITAL_COMMUNITY): Payer: Medicare Other

## 2019-12-11 ENCOUNTER — Observation Stay (HOSPITAL_COMMUNITY)
Admission: EM | Admit: 2019-12-11 | Discharge: 2019-12-12 | Disposition: A | Discharge status: Home / Self Care | Payer: Medicare Other | Attending: Cardiology | Admitting: Cardiology

## 2019-12-11 ENCOUNTER — Encounter (HOSPITAL_COMMUNITY): Payer: Self-pay | Admitting: Emergency Medicine

## 2019-12-11 DIAGNOSIS — I1310 Hypertensive heart and chronic kidney disease without heart failure, with stage 1 through stage 4 chronic kidney disease, or unspecified chronic kidney disease: Secondary | ICD-10-CM | POA: Diagnosis not present

## 2019-12-11 DIAGNOSIS — I4589 Other specified conduction disorders: Secondary | ICD-10-CM | POA: Diagnosis not present

## 2019-12-11 DIAGNOSIS — R001 Bradycardia, unspecified: Secondary | ICD-10-CM | POA: Diagnosis not present

## 2019-12-11 DIAGNOSIS — R531 Weakness: Secondary | ICD-10-CM | POA: Diagnosis not present

## 2019-12-11 DIAGNOSIS — Z79899 Other long term (current) drug therapy: Secondary | ICD-10-CM | POA: Insufficient documentation

## 2019-12-11 DIAGNOSIS — I459 Conduction disorder, unspecified: Secondary | ICD-10-CM | POA: Diagnosis not present

## 2019-12-11 DIAGNOSIS — I10 Essential (primary) hypertension: Secondary | ICD-10-CM | POA: Diagnosis not present

## 2019-12-11 DIAGNOSIS — I361 Nonrheumatic tricuspid (valve) insufficiency: Secondary | ICD-10-CM

## 2019-12-11 DIAGNOSIS — E785 Hyperlipidemia, unspecified: Secondary | ICD-10-CM | POA: Insufficient documentation

## 2019-12-11 DIAGNOSIS — N183 Chronic kidney disease, stage 3 unspecified: Secondary | ICD-10-CM | POA: Insufficient documentation

## 2019-12-11 DIAGNOSIS — I440 Atrioventricular block, first degree: Secondary | ICD-10-CM | POA: Diagnosis not present

## 2019-12-11 DIAGNOSIS — Z950 Presence of cardiac pacemaker: Secondary | ICD-10-CM

## 2019-12-11 DIAGNOSIS — Z882 Allergy status to sulfonamides status: Secondary | ICD-10-CM | POA: Diagnosis not present

## 2019-12-11 DIAGNOSIS — Z85828 Personal history of other malignant neoplasm of skin: Secondary | ICD-10-CM | POA: Diagnosis not present

## 2019-12-11 DIAGNOSIS — Z853 Personal history of malignant neoplasm of breast: Secondary | ICD-10-CM | POA: Insufficient documentation

## 2019-12-11 DIAGNOSIS — Z20822 Contact with and (suspected) exposure to covid-19: Secondary | ICD-10-CM | POA: Diagnosis not present

## 2019-12-11 DIAGNOSIS — I441 Atrioventricular block, second degree: Secondary | ICD-10-CM | POA: Diagnosis not present

## 2019-12-11 DIAGNOSIS — Z87891 Personal history of nicotine dependence: Secondary | ICD-10-CM | POA: Insufficient documentation

## 2019-12-11 DIAGNOSIS — I454 Nonspecific intraventricular block: Secondary | ICD-10-CM | POA: Diagnosis not present

## 2019-12-11 DIAGNOSIS — I340 Nonrheumatic mitral (valve) insufficiency: Secondary | ICD-10-CM | POA: Diagnosis not present

## 2019-12-11 DIAGNOSIS — I499 Cardiac arrhythmia, unspecified: Secondary | ICD-10-CM | POA: Diagnosis not present

## 2019-12-11 DIAGNOSIS — S2231XA Fracture of one rib, right side, initial encounter for closed fracture: Secondary | ICD-10-CM | POA: Diagnosis not present

## 2019-12-11 HISTORY — DX: Hyperlipidemia, unspecified: E78.5

## 2019-12-11 HISTORY — PX: PACEMAKER IMPLANT: EP1218

## 2019-12-11 LAB — TROPONIN I (HIGH SENSITIVITY)
Troponin I (High Sensitivity): <2 ng/L (ref <18)
Troponin I (High Sensitivity): <2 ng/L (ref <18)

## 2019-12-11 LAB — COMPREHENSIVE METABOLIC PANEL
ALT: 46 U/L — ABNORMAL HIGH (ref 0–44)
AST: 43 U/L — ABNORMAL HIGH (ref 15–41)
Albumin: 3.8 g/dL (ref 3.5–5.0)
Alkaline Phosphatase: 65 U/L (ref 38–126)
Anion gap: 12 (ref 5–15)
BUN: 22 mg/dL (ref 8–23)
CO2: 18 mmol/L — ABNORMAL LOW (ref 22–32)
Calcium: 9.2 mg/dL (ref 8.9–10.3)
Chloride: 111 mmol/L (ref 98–111)
Creatinine, Ser: 0.75 mg/dL (ref 0.44–1.00)
GFR calc Af Amer: >60 mL/min (ref >60)
GFR calc non Af Amer: >60 mL/min (ref >60)
Glucose, Bld: 98 mg/dL (ref 70–99)
Potassium: 3.8 mmol/L (ref 3.5–5.1)
Sodium: 141 mmol/L (ref 135–145)
Total Bilirubin: 0.8 mg/dL (ref 0.3–1.2)
Total Protein: 6.4 g/dL — ABNORMAL LOW (ref 6.5–8.1)

## 2019-12-11 LAB — CBC WITH DIFFERENTIAL/PLATELET
Abs Immature Granulocytes: 0.01 10*3/uL (ref 0.00–0.07)
Basophils Absolute: 0 10*3/uL (ref 0.0–0.1)
Basophils Relative: 1 %
Eosinophils Absolute: 0.1 10*3/uL (ref 0.0–0.5)
Eosinophils Relative: 2 %
HCT: 39.7 % (ref 36.0–46.0)
Hemoglobin: 13.1 g/dL (ref 12.0–15.0)
Immature Granulocytes: 0 %
Lymphocytes Relative: 23 %
Lymphs Abs: 1.6 10*3/uL (ref 0.7–4.0)
MCH: 31.4 pg (ref 26.0–34.0)
MCHC: 33 g/dL (ref 30.0–36.0)
MCV: 95.2 fL (ref 80.0–100.0)
Monocytes Absolute: 0.6 10*3/uL (ref 0.1–1.0)
Monocytes Relative: 8 %
Neutro Abs: 4.5 10*3/uL (ref 1.7–7.7)
Neutrophils Relative %: 66 %
Platelets: 247 10*3/uL (ref 150–400)
RBC: 4.17 MIL/uL (ref 3.87–5.11)
RDW: 13.5 % (ref 11.5–15.5)
WBC: 6.8 10*3/uL (ref 4.0–10.5)
nRBC: 0 % (ref 0.0–0.2)

## 2019-12-11 LAB — ECHOCARDIOGRAM COMPLETE
AR max vel: 2.19 cm2
AV Area VTI: 2.15 cm2
AV Area mean vel: 2.1 cm2
AV Mean grad: 3.2 mmHg
AV Peak grad: 6.7 mmHg
Ao pk vel: 1.29 m/s
Area-P 1/2: 2.31 cm2
Height: 64 in
S' Lateral: 1.79 cm
Weight: 2320 oz

## 2019-12-11 LAB — SARS CORONAVIRUS 2 BY RT PCR (HOSPITAL ORDER, PERFORMED IN ~~LOC~~ HOSPITAL LAB): SARS Coronavirus 2: NEGATIVE

## 2019-12-11 LAB — TSH: TSH: 2.9 u[IU]/mL (ref 0.350–4.500)

## 2019-12-11 SURGERY — PACEMAKER IMPLANT

## 2019-12-11 MED ORDER — SODIUM CHLORIDE 0.9 % IV SOLN
INTRAVENOUS | Status: AC
  Filled 2019-12-11: qty 2

## 2019-12-11 MED ORDER — RALOXIFENE HCL 60 MG PO TABS
60.0000 mg | ORAL_TABLET | Freq: Every day | ORAL | Status: DC
  Filled 2019-12-11: qty 1

## 2019-12-11 MED ORDER — ENOXAPARIN SODIUM 40 MG/0.4ML ~~LOC~~ SOLN: 40.0000 mg | SUBCUTANEOUS | Status: DC

## 2019-12-11 MED ORDER — LIDOCAINE HCL 1 % IJ SOLN
INTRAMUSCULAR | Status: AC
  Filled 2019-12-11: qty 60

## 2019-12-11 MED ORDER — ATORVASTATIN CALCIUM 40 MG PO TABS
40.0000 mg | ORAL_TABLET | Freq: Every day | ORAL | Status: DC
  Administered 2019-12-12: 40 mg via ORAL
  Filled 2019-12-11: qty 1

## 2019-12-11 MED ORDER — MIDAZOLAM HCL 5 MG/5ML IJ SOLN
INTRAMUSCULAR | Status: DC | PRN
  Administered 2019-12-11: 1 mg via INTRAVENOUS
  Administered 2019-12-11: 2 mg via INTRAVENOUS

## 2019-12-11 MED ORDER — FENTANYL CITRATE (PF) 100 MCG/2ML IJ SOLN
INTRAMUSCULAR | Status: DC | PRN
  Administered 2019-12-11: 12.5 ug via INTRAVENOUS
  Administered 2019-12-11: 25 ug via INTRAVENOUS

## 2019-12-11 MED ORDER — SODIUM CHLORIDE 0.9 % IV SOLN: INTRAVENOUS | Status: DC

## 2019-12-11 MED ORDER — LIDOCAINE HCL (PF) 1 % IJ SOLN
INTRAMUSCULAR | Status: DC | PRN
  Administered 2019-12-11: 60 mL

## 2019-12-11 MED ORDER — ACETAMINOPHEN 325 MG PO TABS
325.0000 mg | ORAL_TABLET | ORAL | Status: DC | PRN
  Administered 2019-12-12: 650 mg via ORAL
  Filled 2019-12-11: qty 2

## 2019-12-11 MED ORDER — LIDOCAINE HCL (PF) 1 % IJ SOLN
INTRAMUSCULAR | Status: AC
  Filled 2019-12-11: qty 30

## 2019-12-11 MED ORDER — CEFAZOLIN SODIUM-DEXTROSE 1-4 GM/50ML-% IV SOLN
1.0000 g | Freq: Four times a day (QID) | INTRAVENOUS | Status: AC
  Administered 2019-12-11 – 2019-12-12 (×3): 1 g via INTRAVENOUS
  Filled 2019-12-11 (×3): qty 50

## 2019-12-11 MED ORDER — FENTANYL CITRATE (PF) 100 MCG/2ML IJ SOLN
INTRAMUSCULAR | Status: AC
  Filled 2019-12-11: qty 2

## 2019-12-11 MED ORDER — CEFAZOLIN SODIUM-DEXTROSE 2-3 GM-%(50ML) IV SOLR
INTRAVENOUS | Status: AC | PRN
  Administered 2019-12-11: 2 g via INTRAVENOUS

## 2019-12-11 MED ORDER — HEPARIN (PORCINE) IN NACL 1000-0.9 UT/500ML-% IV SOLN
INTRAVENOUS | Status: AC
  Filled 2019-12-11: qty 500

## 2019-12-11 MED ORDER — MIDAZOLAM HCL 5 MG/5ML IJ SOLN
INTRAMUSCULAR | Status: AC
  Filled 2019-12-11: qty 5

## 2019-12-11 MED ORDER — ONDANSETRON HCL 4 MG/2ML IJ SOLN: 4.0000 mg | Freq: Four times a day (QID) | INTRAMUSCULAR | Status: DC | PRN

## 2019-12-11 MED ORDER — HEPARIN (PORCINE) IN NACL 1000-0.9 UT/500ML-% IV SOLN
INTRAVENOUS | Status: DC | PRN
  Administered 2019-12-11: 500 mL

## 2019-12-11 MED ORDER — SODIUM CHLORIDE 0.9 % IV SOLN
INTRAVENOUS | Status: DC | PRN
  Administered 2019-12-11: 500 mL

## 2019-12-11 MED ORDER — CEFAZOLIN SODIUM-DEXTROSE 2-4 GM/100ML-% IV SOLN
INTRAVENOUS | Status: AC
  Filled 2019-12-11: qty 100

## 2019-12-11 SURGICAL SUPPLY — 12 items
CABLE SURGICAL S-101-97-12 (CABLE) ×3 IMPLANT
CATH RIGHTSITE C315HIS02 (CATHETERS) ×4 IMPLANT
IPG PACE AZUR XT DR MRI W1DR01 (Pacemaker) IMPLANT
LEAD CAPSURE NOVUS 5076-52CM (Lead) ×2 IMPLANT
LEAD SELECT SECURE 3830 383069 (Lead) IMPLANT
PACE AZURE XT DR MRI W1DR01 (Pacemaker) ×3 IMPLANT
PAD PRO RADIOLUCENT 2001M-C (PAD) ×3 IMPLANT
SELECT SECURE 3830 383069 (Lead) ×3 IMPLANT
SHEATH 7FR PRELUDE SNAP 13 (SHEATH) ×4 IMPLANT
SLITTER 6232ADJ (MISCELLANEOUS) ×2 IMPLANT
TRAY PACEMAKER INSERTION (PACKS) ×3 IMPLANT
WIRE HI TORQ VERSACORE-J 145CM (WIRE) ×2 IMPLANT

## 2019-12-11 NOTE — Discharge Summary (Addendum)
ELECTROPHYSIOLOGY PROCEDURE DISCHARGE SUMMARY    Patient ID: [name],  MRN: [id number], DOB/AGE: 1940-10-12 79 y.o.  Admit date: 12/11/2019 Discharge date:  12/12/19   Primary Care Physician: Janith Lima, MD  Primary Cardiologist: No primary care provider on file.  Electrophysiologist: New to Dr. Lovena Le  Primary Discharge Diagnosis:  Symptomatic bradycardia/ Second degree HB status post pacemaker implantation this admission  Secondary Discharge Diagnosis:  HTN HLD  Allergies  Allergen Reactions   Anastrozole Other (See Comments) and Rash    Causes muscle weakness   Lisinopril     cough   Sulfasalazine Hives   Sulfonamide Derivatives Hives     Procedures This Admission:  1.  Implantation of a Medtronic dual chamber PPM on 12/11/2019 by Dr. Lovena Le.  The patient received a Medtronic model number [id number] PPM with model number 5076 right atrial lead and 3830 right ventricular lead. There were no immediate post procedure complications. 2.  CXR on 12/12/19  demonstrated no pneumothorax status post device implantation.   Brief HPI: Jessica Woodward is a 79 y.o. female was seen at [facility] ED for sympomatic bradycardia. EP was contacted and agreed to see the patient at [facility] for consideration of PPM implantation.  Past medical history includes above.  The patient has had symptomatic bradycardia without reversible causes identified.  Risks, benefits, and alternatives to PPM implantation were reviewed with the patient who wished to proceed.   Hospital Course:  The patient was admitted and underwent implantation of a Medtronic dual chamber PPM with details as outlined above.  She was monitored on telemetry overnight which demonstrated NSR.  Left chest was without hematoma or ecchymosis.  The device was interrogated and found to be functioning normally.  CXR was obtained and demonstrated no pneumothorax status post device implantation.  Wound care, arm mobility,  and restrictions were reviewed with the patient.  The patient was examined and considered stable for discharge to home.    Physical Exam: Vitals:   12/11/19 2100 12/12/19 0100 12/12/19 0530 12/12/19 0824  BP:  108/70  105/63  Pulse:  60  78  Resp:  19  20  Temp: 98.6 F (37 C) 97.8 F (36.6 C)  98.4 F (36.9 C)  TempSrc: Oral Oral  Oral  SpO2:  100%  98%  Weight:   65.7 kg   Height:        GEN- The patient is well appearing, alert and oriented x 3 today.   HEENT: normocephalic, atraumatic; sclera clear, conjunctiva pink; hearing intact; oropharynx clear; neck supple, no JVP Lymph- no cervical lymphadenopathy Lungs- Clear to ausculation bilaterally, normal work of breathing.  No wheezes, rales, rhonchi Heart- Regular rate and rhythm, no murmurs, rubs or gallops, PMI not laterally displaced GI- soft, non-tender, non-distended, bowel sounds present, no hepatosplenomegaly Extremities- no clubbing, cyanosis, or edema; DP/PT/radial pulses 2+ bilaterally MS- no significant deformity or atrophy Skin- warm and dry, no rash or lesion, left chest without hematoma/ecchymosis Psych- euthymic mood, full affect Neuro- strength and sensation are intact   Labs:   Lab Results  Component Value Date   WBC 10.1 12/12/2019   HGB 12.7 12/12/2019   HCT 38.2 12/12/2019   MCV 95.0 12/12/2019   PLT 236 12/12/2019    Recent Labs  Lab 12/11/19 0807 12/11/19 0807 12/12/19 0744  NA 141   < > 141  K 3.8   < > 3.9  CL 111   < > 110  CO2  18*   < > 21*  BUN 22   < > 18  CREATININE 0.75   < > 0.85  CALCIUM 9.2   < > 9.3  PROT 6.4*  --   --   BILITOT 0.8  --   --   ALKPHOS 65  --   --   ALT 46*  --   --   AST 43*  --   --   GLUCOSE 98   < > 95   < > = values in this interval not displayed.    Discharge Medications:  Allergies as of 12/12/2019       Reactions   Anastrozole Other (See Comments), Rash   Causes muscle weakness   Lisinopril    cough   Sulfasalazine Hives   Sulfonamide  Derivatives Hives        Medication List     TAKE these medications    acetaminophen 325 MG tablet Commonly known as: TYLENOL Take 1-2 tablets (325-650 mg total) by mouth every 4 (four) hours as needed for mild pain.   atorvastatin 40 MG tablet Commonly known as: LIPITOR TAKE 1 TABLET BY MOUTH  DAILY   benzonatate 200 MG capsule Commonly known as: TESSALON Take 1 capsule (200 mg total) by mouth 3 (three) times daily as needed.   cyclobenzaprine 5 MG tablet Commonly known as: FLEXERIL Take 1 tablet (5 mg total) by mouth 3 (three) times daily as needed for muscle spasms.   irbesartan 150 MG tablet Commonly known as: AVAPRO TAKE 1 TABLET BY MOUTH  DAILY   loratadine 10 MG tablet Commonly known as: CLARITIN Take 10 mg by mouth daily.   meloxicam 7.5 MG tablet Commonly known as: MOBIC TAKE 1 TABLET BY MOUTH  DAILY   multivitamin tablet Take 1 tablet by mouth daily.   raloxifene 60 MG tablet Commonly known as: EVISTA TAKE 1 TABLET BY MOUTH  DAILY   triamcinolone cream 0.1 % Commonly known as: KENALOG Apply 1 application topically 4 (four) times daily as needed (rash).        Disposition:     Follow-up Information     Evans Lance, MD Follow up on 03/14/2020.   Specialty: Cardiology Why: at 3 pm for 3 month post pacemaker check Contact information: [address] [place] [address] [phone]         [facility] Follow up on 12/25/2019.   Why: at 230 pm for 10-14 day pacemaker wound check Contact information: [place] [address] [phone]                Duration of Discharge Encounter: Greater than 30 minutes including physician time.  Signed, Shirley Friar, PA-C  12/12/2019 8:56 AM  I have seen and examined this patient with Oda Kilts.  Agree with above, note added to reflect my findings.  On exam, RRR, no  murmurs, lungs clear.  She is now status post Medtronic pacemaker for complete AV block.  Device functioning appropriately.  Chest x-ray and interrogation without issue.  Plan for discharge today with follow-up in device clinic.  Nicklas Mcsweeney M. Janice Seales MD 12/12/2019 9:04 AM

## 2019-12-11 NOTE — H&P (View-Only) (Signed)
ELECTROPHYSIOLOGY CONSULT NOTE    Patient ID: Jessica Woodward MRN: [id number], DOB/AGE: 1940-11-28 79 y.o.  Admit date: 12/11/2019 Date of Consult: 12/11/2019  Primary Physician: Janith Lima, MD Primary Cardiologist: No primary care provider on file.  Electrophysiologist: New to Dr. Lovena Le  Referring Provider: Dr. Harl Bowie  Patient Profile: Jessica Woodward is a 79 y.o. female with a history of HTN, HLD and left breast cancer who is being seen today for the evaluation of second degree AV block at the request of Dr. Harl Bowie.  HPI:  [name] is a 79 y.o. female with medical history as above. She states she has had worsening weakness and fatigue x 2 days. She was especially exhausted after climbing stairs yesterday. She denies chest pain or syncopal events. She has had several episodes of near syncope. Her HR at home was in the 40s during one particular episode of near syncope.   Denies history of CAD, CHF, arrhythmias. She does have a family history of mother and father both having heart attacks.   Labwork unremarkable and patient not on AV nodal blockers. Pt transferred to [facility] for PPM implantation.  She is feeling OK currently. She has continued to have intermittent lightheadedness. Currently her rates are stable in the 60-70s  Past Medical History:  Diagnosis Date  . Arthritis   . Breast disorder    cancer  . CARCINOMA, SQUAMOUS CELL, HX OF 06/05/2007  . DUCTAL CARCINOMA IN SITU, LEFT BREAST 2002   s/p left lumpectomy, xrt and tamoxifen  . History of chemotherapy   . Hyperlipemia   . Hypertension   . Personal history of chemotherapy 2002  . Personal history of radiation therapy 2002  . RADIATION THERAPY, HX OF   . Seasonal allergies   . Skin cancer      Surgical History:  Past Surgical History:  Procedure Laterality Date  . ABDOMINAL HYSTERECTOMY    . athroscopy right knee  2013   torn miniscus  . BREAST BIOPSY Left 2002  . BREAST EXCISIONAL BIOPSY Right   .  BREAST LUMPECTOMY Left 2002   had chemo and radiation  . BREAST SURGERY     Left breast lumpectomy with axillary sential node  . DILATION AND CURETTAGE OF UTERUS    . Left knee surgery    . MOHS SURGERY     (R) arm  . right breast lupectomy    . TONSILLECTOMY AND ADENOIDECTOMY    . TUBAL LIGATION       Medications Prior to Admission  Medication Sig Dispense Refill Last Dose  . atorvastatin (LIPITOR) 40 MG tablet TAKE 1 TABLET BY MOUTH  DAILY (Patient taking differently: Take 40 mg by mouth daily. ) 90 tablet 1 12/11/2019 at Unknown time  . irbesartan (AVAPRO) 150 MG tablet TAKE 1 TABLET BY MOUTH  DAILY (Patient taking differently: Take 150 mg by mouth daily. ) 90 tablet 0 12/11/2019 at Unknown time  . loratadine (CLARITIN) 10 MG tablet Take 10 mg by mouth daily.   unknown  . meloxicam (MOBIC) 7.5 MG tablet TAKE 1 TABLET BY MOUTH  DAILY (Patient taking differently: Take 7.5 mg by mouth daily. ) 90 tablet 1 12/11/2019 at Unknown time  . Multiple Vitamin (MULTIVITAMIN) tablet Take 1 tablet by mouth daily.   12/11/2019 at Unknown time  . raloxifene (EVISTA) 60 MG tablet TAKE 1 TABLET BY MOUTH  DAILY (Patient taking differently: Take 60 mg by mouth daily. ) 90 tablet 3 12/11/2019 at Unknown time  .  benzonatate (TESSALON) 200 MG capsule Take 1 capsule (200 mg total) by mouth 3 (three) times daily as needed. (Patient not taking: Reported on 12/11/2019) 30 capsule 0 Completed Course at Unknown time  . cyclobenzaprine (FLEXERIL) 5 MG tablet Take 1 tablet (5 mg total) by mouth 3 (three) times daily as needed for muscle spasms. 90 tablet 2 unkown  . triamcinolone cream (KENALOG) 0.1 % Apply 1 application topically 4 (four) times daily as needed (rash).    unknown    Inpatient Medications:   Allergies:  Allergies  Allergen Reactions  . Anastrozole Other (See Comments) and Rash    Causes muscle weakness  . Lisinopril     cough  . Sulfasalazine Hives  . Sulfonamide Derivatives Hives    Social  History   Socioeconomic History  . Marital status: Widowed    Spouse name: Not on file  . Number of children: 2  . Years of education: Not on file  . Highest education level: Not on file  Occupational History  . Occupation: retired  Tobacco Use  . Smoking status: Former Smoker    Types: Cigarettes    Quit date: 12/28/1963    Years since quitting: 55.9  . Smokeless tobacco: Never Used  Vaping Use  . Vaping Use: Never used  Substance and Sexual Activity  . Alcohol use: Not Currently    Comment: rare glass of wine  . Drug use: No  . Sexual activity: Not Currently    Partners: Male    Birth control/protection: Surgical    Comment: hyst  Other Topics Concern  . Not on file  Social History Narrative   HSG, [employer] college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 married '60. 2 boys - '62, '73; 11 grandchildren; 3 great-grandchildren. Retired.   Social Determinants of Health   Financial Resource Strain:   . Difficulty of Paying Living Expenses:   Food Insecurity:   . Worried About Charity fundraiser in the Last Year:   . Arboriculturist in the Last Year:   Transportation Needs:   . Film/video editor (Medical):   Marland Kitchen Lack of Transportation (Non-Medical):   Physical Activity:   . Days of Exercise per Week:   . Minutes of Exercise per Session:   Stress:   . Feeling of Stress :   Social Connections:   . Frequency of Communication with Friends and Family:   . Frequency of Social Gatherings with Friends and Family:   . Attends Religious Services:   . Active Member of Clubs or Organizations:   . Attends Archivist Meetings:   Marland Kitchen Marital Status:   Intimate Partner Violence:   . Fear of Current or Ex-Partner:   . Emotionally Abused:   Marland Kitchen Physically Abused:   . Sexually Abused:      Family History  Problem Relation Age of Onset  . Coronary artery disease Mother   . Dementia Mother   . Macular degeneration Father   . Coronary artery disease Father   . Diabetes Father     . Cancer Brother        pancreatic  . Heart disease Maternal Grandmother   . Kidney disease Maternal Grandmother   . Heart disease Maternal Grandfather   . Heart disease Paternal Grandmother   . Diabetes Paternal Grandmother   . Heart disease Paternal Grandfather   . Hypertension Son   . Heart attack Son   . Hypertension Son      Review of Systems: All  other systems reviewed and are otherwise negative except as noted above.  Physical Exam: Vitals:   12/11/19 1320 12/11/19 1324 12/11/19 1425 12/11/19 1430  BP:   (!) 153/137 (!) 173/78  Pulse: 73 74 65 65  Resp: 13 15 (!) 21 14  Temp:      TempSrc:      SpO2: 99% 100% 100% 100%  Weight:      Height:        GEN- The patient is well appearing, alert and oriented x 3 today.   HEENT: normocephalic, atraumatic; sclera clear, conjunctiva pink; hearing intact; oropharynx clear; neck supple Lungs- Clear to ausculation bilaterally, normal work of breathing.  No wheezes, rales, rhonchi Heart- Regular rate and rhythm, no murmurs, rubs or gallops GI- soft, non-tender, non-distended, bowel sounds present Extremities- no clubbing, cyanosis, or edema; DP/PT/radial pulses 2+ bilaterally MS- no significant deformity or atrophy Skin- warm and dry, no rash or lesion Psych- euthymic mood, full affect Neuro- strength and sensation are intact  Labs:   Lab Results  Component Value Date   WBC 6.8 12/11/2019   HGB 13.1 12/11/2019   HCT 39.7 12/11/2019   MCV 95.2 12/11/2019   PLT 247 12/11/2019    Recent Labs  Lab 12/11/19 0807  NA 141  K 3.8  CL 111  CO2 18*  BUN 22  CREATININE 0.75  CALCIUM 9.2  PROT 6.4*  BILITOT 0.8  ALKPHOS 65  ALT 46*  AST 43*  GLUCOSE 98      Radiology/Studies: DG Chest Port 1 View  Result Date: 12/11/2019 CLINICAL DATA:  Weakness EXAM: PORTABLE CHEST 1 VIEW COMPARISON:  December 08, 2018, February 06, 2015 FINDINGS: The cardiomediastinal silhouette is unchanged in contour. Tortuous thoracic aorta.  Surgical clips project over the left breast and axilla. Incidental note of an azygos fissure. Biapical pleuroparenchymal scarring, unchanged. No pleural effusion. No pneumothorax. No acute pleuroparenchymal abnormality. Visualized abdomen is unremarkable. Mild degenerative changes of the thoracic spine. Remote right rib fracture. IMPRESSION: No acute cardiopulmonary abnormality. Electronically Signed   By: Valentino Saxon MD   On: 12/11/2019 08:05   ECHOCARDIOGRAM COMPLETE  Result Date: 12/11/2019    ECHOCARDIOGRAM REPORT   Patient Name:   INFINITI HOEFLING Date of Exam: 12/11/2019 Medical Rec #:  [id number]      Height:       64.0 in Accession #:    [id number]     Weight:       145.0 lb Date of Birth:  December 28, 1940      BSA:          1.706 m Patient Age:    23 years       BP:           157/70 mmHg Patient Gender: F              HR:           65 bpm. Exam Location:  [facility] Procedure: 2D Echo, Cardiac Doppler and Color Doppler Indications:    Heartblock I45.9  History:        Patient has no prior history of Echocardiogram examinations.                 Risk Factors:Hypertension and Dyslipidemia. DUCTAL CARCINOMA IN                 SITU, LEFT BREAST, RADIATION THERAPY, HX OF (Resolved), History  of chemotherapy.  Sonographer:    Alvino Chapel RCS Referring Phys: [id number] [place]  1. Left ventricular ejection fraction, by estimation, is 70 to 75%. The left ventricle has hyperdynamic function. The left ventricle has no regional wall motion abnormalities. Left ventricular diastolic parameters are consistent with Grade I diastolic dysfunction (impaired relaxation).  2. Right ventricular systolic function is normal. The right ventricular size is normal. There is normal pulmonary artery systolic pressure.  3. The mitral valve is normal in structure. Mild mitral valve regurgitation. No evidence of mitral stenosis.  4. The aortic valve is tricuspid. Aortic valve regurgitation is not  visualized. No aortic stenosis is present.  5. The inferior vena cava is normal in size with greater than 50% respiratory variability, suggesting right atrial pressure of 3 mmHg. FINDINGS  Left Ventricle: Left ventricular ejection fraction, by estimation, is 70 to 75%. The left ventricle has hyperdynamic function. The left ventricle has no regional wall motion abnormalities. The left ventricular internal cavity size was normal in size. There is no left ventricular hypertrophy. Left ventricular diastolic parameters are consistent with Grade I diastolic dysfunction (impaired relaxation). Normal left ventricular filling pressure. Right Ventricle: The right ventricular size is normal. No increase in right ventricular wall thickness. Right ventricular systolic function is normal. There is normal pulmonary artery systolic pressure. The tricuspid regurgitant velocity is 2.29 m/s, and  with an assumed right atrial pressure of 3 mmHg, the estimated right ventricular systolic pressure is 73.4 mmHg. Left Atrium: Left atrial size was normal in size. Right Atrium: Right atrial size was normal in size. Pericardium: Trivial pericardial effusion is present. The pericardial effusion is circumferential. Mitral Valve: The mitral valve is normal in structure. Mild mitral valve regurgitation. No evidence of mitral valve stenosis. Tricuspid Valve: The tricuspid valve is normal in structure. Tricuspid valve regurgitation is mild . No evidence of tricuspid stenosis. Aortic Valve: The aortic valve is tricuspid. . There is mild thickening and mild calcification of the aortic valve. Aortic valve regurgitation is not visualized. No aortic stenosis is present. Mild aortic valve annular calcification. There is mild thickening of the aortic valve. There is mild calcification of the aortic valve. Aortic valve mean gradient measures 3.2 mmHg. Aortic valve peak gradient measures 6.7 mmHg. Aortic valve area, by VTI measures 2.15 cm. Pulmonic Valve:  The pulmonic valve was not well visualized. Pulmonic valve regurgitation is not visualized. No evidence of pulmonic stenosis. Aorta: The aortic root is normal in size and structure. Pulmonary Artery: Indeterminant PASP, inadequate TR jet. Venous: The inferior vena cava is normal in size with greater than 50% respiratory variability, suggesting right atrial pressure of 3 mmHg. IAS/Shunts: No atrial level shunt detected by color flow Doppler.  LEFT VENTRICLE PLAX 2D LVIDd:         3.54 cm  Diastology LVIDs:         1.79 cm  LV e' lateral:   10.00 cm/s LV PW:         0.96 cm  LV E/e' lateral: 6.2 LV IVS:        0.95 cm  LV e' medial:    8.81 cm/s LVOT diam:     1.80 cm  LV E/e' medial:  7.0 LV SV:         54 LV SV Index:   32 LVOT Area:     2.54 cm  RIGHT VENTRICLE RV S prime:     12.20 cm/s TAPSE (M-mode): 1.9 cm LEFT ATRIUM  Index      RIGHT ATRIUM          Index LA diam:        2.20 cm 1.29 cm/m RA Area:     5.92 cm LA Vol (A2C):   14.2 ml 8.32 ml/m RA Volume:   8.65 ml  5.07 ml/m LA Vol (A4C):   11.3 ml 6.62 ml/m LA Biplane Vol: 12.5 ml 7.33 ml/m  AORTIC VALVE AV Area (Vmax):    2.19 cm AV Area (Vmean):   2.10 cm AV Area (VTI):     2.15 cm AV Vmax:           129.11 cm/s AV Vmean:          83.378 cm/s AV VTI:            0.252 m AV Peak Grad:      6.7 mmHg AV Mean Grad:      3.2 mmHg LVOT Vmax:         111.00 cm/s LVOT Vmean:        68.800 cm/s LVOT VTI:          0.213 m LVOT/AV VTI ratio: 0.84  AORTA Ao Root diam: 2.70 cm MITRAL VALVE               TRICUSPID VALVE MV Area (PHT): 2.31 cm    TR Peak grad:   21.0 mmHg MV Decel Time: 329 msec    TR Vmax:        229.00 cm/s MV E velocity: 61.70 cm/s MV A velocity: 97.30 cm/s  SHUNTS MV E/A ratio:  0.63        Systemic VTI:  0.21 m                            Systemic Diam: 1.80 cm Carlyle Dolly MD Electronically signed by Carlyle Dolly MD Signature Date/Time: 12/11/2019/12:59:55 PM    Final     EKG: on arrival to ED showed 2:1 AV block at 46  bpm (personally reviewed)  TELEMETRY: currently shows NSR 70-80s (personally reviewed)  Assessment/Plan: 1.  Symptomatic bradycardia / Second Degree AV block Mobitz II Pt has symptomatic bradycardia in the setting of intermittent second degree HB without reversible cause.  Echo today showed EF ~70%.  TSH normal Explained risks, benefits, and alternatives to PPM implantation, including but not limited to bleeding, infection, pneumothorax, pericardial effusion, lead dislodgement, heart attack, stroke, or death.  Pt verbalized understanding and agrees to proceed.   2. HTN Elevated. Will adjust meds as needed post PPM  3. HLD Continue statin.   Dr. Curt Bears to see.  She is on the schedule for pacemaker implantation this afternoon.   For questions or updates, please contact [place] Please consult [email or web address] for contact info under Cardiology/STEMI.  Jacalyn Lefevre, PA-C  12/11/2019 2:35 PM  EP Attending  Patient seen and examined. Agree with the findings as noted above. The patient presents with symptomatic 2:1 AV block. She is on no medications to slow her heart down. I have discussed the indications/risks/benefits/goals/expectations of PPM insertion and she wishes to proceed.  Mikle Bosworth.D.

## 2019-12-11 NOTE — Discharge Instructions (Signed)
After Your Pacemaker   . You have a [employer]onic Pacemaker  ACTIVITY . Do not lift your arm above shoulder height for 1 week after your procedure. After 7 days, you may progress as below.     Tuesday December 18, 2019  Wednesday December 19, 2019 Thursday December 20, 2019 Friday December 21, 2019   . Do not lift, push, pull, or carry anything over 10 pounds with the affected arm until 6 weeks (Tuesday January 22, 2020 ) after your procedure.   . Do NOT DRIVE until you have been seen for your wound check, or as long as instructed by your healthcare provider.   . Ask your healthcare provider when you can go back to work   INCISION/Dressing . If you are on a blood thinner such as Coumadin, Xarelto, Eliquis, Plavix, or Pradaxa please confirm with your provider when this should be resumed.   . Monitor your Pacemaker site for redness, swelling, and drainage. Call the device clinic at [phone] if you experience these symptoms or fever/chills.  . If your incision is sealed with Steri-strips or staples, you may shower 10 days after your procedure or when told by your provider. Do not remove the steri-strips or let the shower hit directly on your site. You may wash around your site with soap and water.    Marland Kitchen Avoid lotions, ointments, or perfumes over your incision until it is well-healed.  . You may use a hot tub or a pool AFTER your wound check appointment if the incision is completely closed.  Marland Kitchen PAcemaker Alerts:  Some alerts are vibratory and others beep. These are NOT emergencies. Please call our office to let us know. If this occurs at night or on weekends, it can wait until the next business day. Send a remote transmission.  . If your device is capable of reading fluid status (for heart failure), you will be offered monthly monitoring to review this with you.   DEVICE MANAGEMENT . Remote monitoring is used to monitor your pacemaker from home. This monitoring is scheduled every 91 days by our office. It  allows [place] to keep an eye on the functioning of your device to ensure it is working properly. You will routinely see your Electrophysiologist annually (more often if necessary).   . You should receive your ID card for your new device in 4-8 weeks. Keep this card with you at all times once received. Consider wearing a medical alert bracelet or necklace.  . Your Pacemaker may be MRI compatible. This will be discussed at your next office visit/wound check.  You should avoid contact with strong electric or magnetic fields.    Do not use amateur (ham) radio equipment or electric (arc) welding torches. MP3 player headphones with magnets should not be used. Some devices are safe to use if held at least 12 inches (30 cm) from your Pacemaker. These include power tools, lawn mowers, and speakers. If you are unsure if something is safe to use, ask your health care provider.   When using your cell phone, hold it to the ear that is on the opposite side from the Pacemaker. Do not leave your cell phone in a pocket over the Pacemaker.   You may safely use electric blankets, heating pads, computers, and microwave ovens.  Call the office right away if:  You have chest pain.  You feel more short of breath than you have felt before.  You feel more light-headed than you have felt before.  Your  incision starts to open up.  This information is not intended to replace advice given to you by your health care provider. Make sure you discuss any questions you have with your health care provider.

## 2019-12-11 NOTE — H&P (Addendum)
Cardiology Admission History and Physical:   Patient ID: Jessica Woodward MRN: [id number]; DOB: 12-Sep-1940   Admission date: 12/11/2019  Primary Care Provider: Janith Lima, MD [facility] Cardiologist: New to [facility] Electrophysiologist:  None   Chief Complaint: Weakness, Fatigue  Patient Profile:   Jessica Woodward is a 79 y.o. female with past medical history of HTN, HLD and history of breast cancer who is being seen today for evaluation of heart block at the request of Dr. Roderic Palau.   History of Present Illness:   Jessica Woodward presented to [facility] ED earlier this morning for evaluation of worsening weakness and fatigue for the past 2 days. In talking with the patient today, she reports being in her normal state of health until Sunday when she developed worsening fatigue. She initially accredited this to being tired as her son and grandchildren had been visiting from [place] last week. She reports some dizziness as well. Yesterday, she continued to experience persistent fatigue and episodes of presyncope but denies any actual syncopal events. Also reports she was more short of breath when climbing her stairs at home. She denies any associated chest pain or palpitations. No recent orthopnea, PND or edema. She did check her heart rate this morning and it was in the 40's which was unusual for her as her heart rate is typically in the 70's to 80's. She was not on any AV nodal blocking agents prior to admission.  No known personal history of CAD, CHF or cardiac arrhythmias. She does have a family history of cardiac issues and reports her mother had a heart attack in her 8's and her father had heart attack as well. She is unaware of either of them requiring PPM placement in the past.  HR was in the 40's to 50's upon arrival. BP at 132/60 on most recent check. Initial labs show WBC 6.8, Hgb 13.1, platelets 247, Na+ 141, K+ 3.8 and creatinine 0.75. TSH 2.900. Initial HS  Troponin < 2.0. COVID negative. EKG shows 2:1 AV Block with HR of 46 and underlying RBBB.    Past Medical History:  Diagnosis Date  . Arthritis   . Breast disorder    cancer  . CARCINOMA, SQUAMOUS CELL, HX OF 06/05/2007  . DUCTAL CARCINOMA IN SITU, LEFT BREAST 2002   s/p left lumpectomy, xrt and tamoxifen  . History of chemotherapy   . Hyperlipemia   . Hypertension   . Personal history of chemotherapy 2002  . Personal history of radiation therapy 2002  . RADIATION THERAPY, HX OF   . Seasonal allergies   . Skin cancer     Past Surgical History:  Procedure Laterality Date  . ABDOMINAL HYSTERECTOMY    . athroscopy right knee  2013   torn miniscus  . BREAST BIOPSY Left 2002  . BREAST EXCISIONAL BIOPSY Right   . BREAST LUMPECTOMY Left 2002   had chemo and radiation  . BREAST SURGERY     Left breast lumpectomy with axillary sential node  . DILATION AND CURETTAGE OF UTERUS    . Left knee surgery    . MOHS SURGERY     (R) arm  . right breast lupectomy    . TONSILLECTOMY AND ADENOIDECTOMY    . TUBAL LIGATION       Medications Prior to Admission: Prior to Admission medications   Medication Sig Start Date End Date Taking? Authorizing Provider  atorvastatin (LIPITOR) 40 MG tablet TAKE 1 TABLET BY  MOUTH  DAILY Patient taking differently: Take 40 mg by mouth daily.  07/09/19  Yes Janith Lima, MD  irbesartan (AVAPRO) 150 MG tablet TAKE 1 TABLET BY MOUTH  DAILY Patient taking differently: Take 150 mg by mouth daily.  10/21/19  Yes Janith Lima, MD  loratadine (CLARITIN) 10 MG tablet Take 10 mg by mouth daily.   Yes [provider]  meloxicam (MOBIC) 7.5 MG tablet TAKE 1 TABLET BY MOUTH  DAILY Patient taking differently: Take 7.5 mg by mouth daily.  08/12/19  Yes Janith Lima, MD  Multiple Vitamin (MULTIVITAMIN) tablet Take 1 tablet by mouth daily.   Yes [provider]  raloxifene (EVISTA) 60 MG tablet TAKE 1 TABLET BY MOUTH  DAILY Patient taking  differently: Take 60 mg by mouth daily.  07/09/19  Yes Janith Lima, MD  benzonatate (TESSALON) 200 MG capsule Take 1 capsule (200 mg total) by mouth 3 (three) times daily as needed. Patient not taking: Reported on 12/11/2019 11/06/19   Marrian Salvage, FNP  cefdinir (OMNICEF) 300 MG capsule Take 1 capsule (300 mg total) by mouth 2 (two) times daily. Patient not taking: Reported on 12/11/2019 11/06/19   Marrian Salvage, FNP  cyclobenzaprine (FLEXERIL) 5 MG tablet Take 1 tablet (5 mg total) by mouth 3 (three) times daily as needed for muscle spasms. 11/06/19   Marrian Salvage, FNP  triamcinolone cream (KENALOG) 0.1 % Apply 1 application topically 4 (four) times daily as needed (rash).  09/09/16   [provider]  meloxicam (MOBIC) 7.5 MG tablet Take 1 tablet (7.5 mg total) by mouth daily. 06/11/19   Janith Lima, MD     Allergies:    Allergies  Allergen Reactions  . Anastrozole Other (See Comments) and Rash    Causes muscle weakness  . Lisinopril     cough  . Sulfasalazine Hives  . Sulfonamide Derivatives Hives    Social History:   Social History   Socioeconomic History  . Marital status: Widowed    Spouse name: Not on file  . Number of children: 2  . Years of education: Not on file  . Highest education level: Not on file  Occupational History  . Occupation: retired  Tobacco Use  . Smoking status: Former Smoker    Types: Cigarettes    Quit date: 12/28/1963    Years since quitting: 55.9  . Smokeless tobacco: Never Used  Vaping Use  . Vaping Use: Never used  Substance and Sexual Activity  . Alcohol use: Not Currently    Comment: rare glass of wine  . Drug use: No  . Sexual activity: Not Currently    Partners: Male    Birth control/protection: Surgical    Comment: hyst  Other Topics Concern  . Not on file  Social History Narrative   HSG, [employer] college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 married '60. 2 boys - '62, '73; 11 grandchildren; 3  great-grandchildren. Retired.   Social Determinants of Health   Financial Resource Strain:   . Difficulty of Paying Living Expenses:   Food Insecurity:   . Worried About Charity fundraiser in the Last Year:   . Arboriculturist in the Last Year:   Transportation Needs:   . Film/video editor (Medical):   Marland Kitchen Lack of Transportation (Non-Medical):   Physical Activity:   . Days of Exercise per Week:   . Minutes of Exercise per Session:   Stress:   . Feeling  of Stress :   Social Connections:   . Frequency of Communication with Friends and Family:   . Frequency of Social Gatherings with Friends and Family:   . Attends Religious Services:   . Active Member of Clubs or Organizations:   . Attends Archivist Meetings:   Marland Kitchen Marital Status:   Intimate Partner Violence:   . Fear of Current or Ex-Partner:   . Emotionally Abused:   Marland Kitchen Physically Abused:   . Sexually Abused:     Family History:   The patient's family history includes Cancer in her brother; Coronary artery disease in her father and mother; Dementia in her mother; Diabetes in her father and paternal grandmother; Heart attack in her son; Heart disease in her maternal grandfather, maternal grandmother, paternal grandfather, and paternal grandmother; Hypertension in her son and son; Kidney disease in her maternal grandmother; Macular degeneration in her father.    ROS:  Please see the history of present illness.  All other ROS reviewed and negative.     Physical Exam/Data:   Vitals:   12/11/19 0800 12/11/19 0830 12/11/19 0900 12/11/19 0930  BP: 139/68 (!) 147/67 120/74 132/60  Pulse: (!) 44 (!) 44 (!) 41 (!) 41  Resp: 18 12 10 11   Temp:      TempSrc:      SpO2: 98% 100% 95% 93%  Weight:      Height:       No intake or output data in the 24 hours ending 12/11/19 0957 Last 3 Weights 12/11/2019 06/12/2019 06/11/2019  Weight (lbs) 145 lb (No Data) 146 lb 6 oz  Weight (kg) 65.772 kg (No Data) 66.395 kg     Body  mass index is 24.89 kg/m.  General:  Well nourished, well developed female appearing in no acute distress. HEENT: normal Lymph: no adenopathy Neck: no JVD Endocrine:  No thryomegaly Vascular: No carotid bruits; FA pulses 2+ bilaterally without bruits  Cardiac:  normal S1, S2; Regular rhythm, bradycardiac rate; no murmur. Lungs:  clear to auscultation bilaterally, no wheezing, rhonchi or rales  Abd: soft, nontender, no hepatomegaly  Ext: no lower extremity edema Musculoskeletal:  No deformities, BUE and BLE strength normal and equal Skin: warm and dry  Neuro:  CNs 2-12 intact, no focal abnormalities noted Psych:  Normal affect    EKG:  The ECG that was done  was personally reviewed and demonstrates 2:1 AV Block with HR of 46 and underlying RBBB.   Relevant CV Studies:  None on File.   Laboratory Data:  High Sensitivity Troponin:   Recent Labs  Lab 12/11/19 0807  TROPONINIHS <2      Chemistry Recent Labs  Lab 12/11/19 0807  NA 141  K 3.8  CL 111  CO2 18*  GLUCOSE 98  BUN 22  CREATININE 0.75  CALCIUM 9.2  GFRNONAA >60  GFRAA >60  ANIONGAP 12    Recent Labs  Lab 12/11/19 0807  PROT 6.4*  ALBUMIN 3.8  AST 43*  ALT 46*  ALKPHOS 65  BILITOT 0.8   Hematology Recent Labs  Lab 12/11/19 0807  WBC 6.8  RBC 4.17  HGB 13.1  HCT 39.7  MCV 95.2  MCH 31.4  MCHC 33.0  RDW 13.5  PLT 247   BNPNo results for input(s): BNP, PROBNP in the last 168 hours.  DDimer No results for input(s): DDIMER in the last 168 hours.   Radiology/Studies:  DG Chest Port 1 View  Result Date: 12/11/2019 CLINICAL DATA:  Weakness  EXAM: PORTABLE CHEST 1 VIEW COMPARISON:  December 08, 2018, February 06, 2015 FINDINGS: The cardiomediastinal silhouette is unchanged in contour. Tortuous thoracic aorta. Surgical clips project over the left breast and axilla. Incidental note of an azygos fissure. Biapical pleuroparenchymal scarring, unchanged. No pleural effusion. No pneumothorax. No acute  pleuroparenchymal abnormality. Visualized abdomen is unremarkable. Mild degenerative changes of the thoracic spine. Remote right rib fracture. IMPRESSION: No acute cardiopulmonary abnormality. Electronically Signed   By: Valentino Saxon MD   On: 12/11/2019 08:05    Assessment and Plan:   1. 2:1 AV Block - She presents with progressive weakness and fatigue which started on Sunday. Reports dyspnea with climbing steps yesterday and presyncopal episodes.  - EKG on admission shows 2:1 AV Block with HR of 46 and underlying RBBB. HR in the 40's to 50's by review of telemetry. Electrolytes and TSH WNL. Will obtain an echocardiogram per EP to assess for any underlying structural abnormalities.  - Reviewed results with the patient and she is in agreement for transfer to [facility] for evaluation by EP and likely PPM placement. EP and Card Master aware of transfer. Will keep NPO for now as pending schedule availability, she might be able to undergo PPM placement today. Not on any AV nodal blocking agents prior to admission. No indication for temp pacer as BP and oxygen saturations remain stable.   2. HTN - BP has been at 120/74 - 147/75 while in the ED. On Irbesartan 150mg  daily prior to admission. Will hold for now given possible procedure today. Restart prior to discharge.   3. HLD - Followed by her PCP as an outpatient. Continue PTA Atorvastatin 40mg  daily.    Severity of Illness: The appropriate patient status for this patient is INPATIENT. Inpatient status is judged to be reasonable and necessary in order to provide the required intensity of service to ensure the patient's safety. The patient's presenting symptoms, physical exam findings, and initial radiographic and laboratory data in the context of their chronic comorbidities is felt to place them at high risk for further clinical deterioration. Furthermore, it is not anticipated that the patient will be medically stable for discharge from the hospital  within 2 midnights of admission. The following factors support the patient status of inpatient.   " The patient's presenting symptoms include dyspnea, dizziness and presyncope. " The worrisome physical exam findings include bradycardia. " The initial radiographic and laboratory data are worrisome because of 2:1 heart block. " The chronic co-morbidities include HTN and HLD.   * I certify that at the point of admission it is my clinical judgment that the patient will require inpatient hospital care spanning beyond 2 midnights from the point of admission due to high intensity of service, high risk for further deterioration and high frequency of surveillance required.*    For questions or updates, please contact [place] Please consult [email or web address] for contact info under     Signed, Erma Heritage, PA-C  12/11/2019 9:57 AM   Patient seen and discussed with PA Ahmed Prima, I agree with her documentation. 79 yo female history of HTN, hyperlipidemia presents with genrealized weakness, fatigue, dizziness, presyncope. In ER found to be in 2:1 AV block with normal bp's. She is not on any av nodal agents at home, TSH was normal. We will plan for transfer to [facility] for EP evaluation after echo in ER, consideration for pacemaker, potentially today. Needs to remain NPO.   Carlyle Dolly MD

## 2019-12-11 NOTE — Progress Notes (Incomplete)
Patient seen and discussed with PA Ahmed Prima, I agree with her documentation. 79 yo female history of HTN, hyperlipidemia presents with genrealized weakness, fatigue, dizziness, presyncope. In ER found to be in 2:1 AV block with normal bp's. She is not on any av nodal agents at home, TSH was normal. We will plan for transfer to [facility] for EP evaluation after echo in ER, consideration for pacemaker, potentially today. Needs to remain NPO.   Carlyle Dolly MD

## 2019-12-11 NOTE — Progress Notes (Signed)
Pt transported here from by [facility] from [facility]. Pt is here for a pacemaker implant this afternoon. Pt denies any pain. Pt is A&Ox4 and placed on cardiac monitor.

## 2019-12-11 NOTE — ED Triage Notes (Signed)
PT states two days ago she started feeling generally weak, lightheadedness intermittently and decreased stamina on exertion. PT denies any pain upon arrival and is A&O.

## 2019-12-11 NOTE — ED Provider Notes (Signed)
[facility] EMERGENCY DEPARTMENT Provider Note   CSN: [id number] Arrival date & time: 12/11/19  2841     History Chief Complaint  Patient presents with  . Weakness    Jessica Woodward is a 79 y.o. female.  Patient complains of weakness and no energy when she is walking upstairs.  This has been going on for a few days.  The history is provided by the patient and medical records. No language interpreter was used.  Weakness Severity:  Moderate Onset quality:  Sudden Timing:  Constant Progression:  Worsening Chronicity:  New Context: alcohol use   Relieved by:  Nothing Worsened by:  Nothing Ineffective treatments:  None tried Associated symptoms: no abdominal pain, no chest pain, no cough, no diarrhea, no frequency, no headaches and no seizures   Risk factors: no anemia        Past Medical History:  Diagnosis Date  . Arthritis   . Breast disorder    cancer  . CARCINOMA, SQUAMOUS CELL, HX OF 06/05/2007  . DUCTAL CARCINOMA IN SITU, LEFT BREAST 2002   s/p left lumpectomy, xrt and tamoxifen  . History of chemotherapy   . Hyperlipemia   . Hypertension   . Personal history of chemotherapy 2002  . Personal history of radiation therapy 2002  . RADIATION THERAPY, HX OF   . Seasonal allergies   . Skin cancer     Patient Active Problem List   Diagnosis Date Noted  . Heart block 12/11/2019  . DDD (degenerative disc disease), cervical 05/10/2019  . Chronic renal disease, stage 3, moderately decreased glomerular filtration rate (GFR) between 30-59 mL/min/1.73 square meter 12/12/2018  . Seasonal allergic rhinitis due to pollen 02/15/2017  . Bilateral sensorineural hearing loss 12/27/2016  . Nodule of right lung 01/19/2016  . Costochondritis 12/03/2014  . Essential hypertension, benign 09/11/2013  . Routine health maintenance 01/18/2012  . Dyslipidemia, goal LDL below 130 01/17/2012  . DUCTAL CARCINOMA IN SITU, LEFT BREAST 06/05/2007    Past Surgical History:  Procedure  Laterality Date  . ABDOMINAL HYSTERECTOMY    . athroscopy right knee  2013   torn miniscus  . BREAST BIOPSY Left 2002  . BREAST EXCISIONAL BIOPSY Right   . BREAST LUMPECTOMY Left 2002   had chemo and radiation  . BREAST SURGERY     Left breast lumpectomy with axillary sential node  . DILATION AND CURETTAGE OF UTERUS    . Left knee surgery    . MOHS SURGERY     (R) arm  . right breast lupectomy    . TONSILLECTOMY AND ADENOIDECTOMY    . TUBAL LIGATION       OB History    Gravida  3   Para  2   Term      Preterm      AB  1   Living  2     SAB  1   TAB      Ectopic      Multiple      Live Births  2           Family History  Problem Relation Age of Onset  . Coronary artery disease Mother   . Dementia Mother   . Macular degeneration Father   . Coronary artery disease Father   . Diabetes Father   . Cancer Brother        pancreatic  . Heart disease Maternal Grandmother   . Kidney disease Maternal Grandmother   . Heart disease  Maternal Grandfather   . Heart disease Paternal Grandmother   . Diabetes Paternal Grandmother   . Heart disease Paternal Grandfather   . Hypertension Son   . Heart attack Son   . Hypertension Son     Social History   Tobacco Use  . Smoking status: Former Smoker    Types: Cigarettes    Quit date: 12/28/1963    Years since quitting: 55.9  . Smokeless tobacco: Never Used  Vaping Use  . Vaping Use: Never used  Substance Use Topics  . Alcohol use: Not Currently    Comment: rare glass of wine  . Drug use: No    Home Medications Prior to Admission medications   Medication Sig Start Date End Date Taking? Authorizing Provider  atorvastatin (LIPITOR) 40 MG tablet TAKE 1 TABLET BY MOUTH  DAILY Patient taking differently: Take 40 mg by mouth daily.  07/09/19  Yes Janith Lima, MD  irbesartan (AVAPRO) 150 MG tablet TAKE 1 TABLET BY MOUTH  DAILY Patient taking differently: Take 150 mg by mouth daily.  10/21/19  Yes Janith Lima, MD  loratadine (CLARITIN) 10 MG tablet Take 10 mg by mouth daily.   Yes [provider]  meloxicam (MOBIC) 7.5 MG tablet TAKE 1 TABLET BY MOUTH  DAILY Patient taking differently: Take 7.5 mg by mouth daily.  08/12/19  Yes Janith Lima, MD  Multiple Vitamin (MULTIVITAMIN) tablet Take 1 tablet by mouth daily.   Yes [provider]  raloxifene (EVISTA) 60 MG tablet TAKE 1 TABLET BY MOUTH  DAILY Patient taking differently: Take 60 mg by mouth daily.  07/09/19  Yes Janith Lima, MD  benzonatate (TESSALON) 200 MG capsule Take 1 capsule (200 mg total) by mouth 3 (three) times daily as needed. Patient not taking: Reported on 12/11/2019 11/06/19   Marrian Salvage, FNP  cyclobenzaprine (FLEXERIL) 5 MG tablet Take 1 tablet (5 mg total) by mouth 3 (three) times daily as needed for muscle spasms. 11/06/19   Marrian Salvage, FNP  triamcinolone cream (KENALOG) 0.1 % Apply 1 application topically 4 (four) times daily as needed (rash).  09/09/16   [provider]  meloxicam (MOBIC) 7.5 MG tablet Take 1 tablet (7.5 mg total) by mouth daily. 06/11/19   Janith Lima, MD    Allergies    Anastrozole, Lisinopril, Sulfasalazine, and Sulfonamide derivatives  Review of Systems   Review of Systems  Constitutional: Negative for appetite change and fatigue.  HENT: Negative for congestion, ear discharge and sinus pressure.   Eyes: Negative for discharge.  Respiratory: Negative for cough.   Cardiovascular: Negative for chest pain.  Gastrointestinal: Negative for abdominal pain and diarrhea.  Genitourinary: Negative for frequency and hematuria.  Musculoskeletal: Negative for back pain.  Skin: Negative for rash.  Neurological: Positive for weakness. Negative for seizures and headaches.  Psychiatric/Behavioral: Negative for hallucinations.    Physical Exam Updated Vital Signs BP 132/60   Pulse (!) 41   Temp 98 F (36.7 C) (Oral)   Resp 11   Ht 5\' 4"  (1.626 m)   Wt  65.8 kg   SpO2 93%   BMI 24.89 kg/m   Physical Exam Vitals and nursing note reviewed.  Constitutional:      Appearance: She is well-developed.  HENT:     Head: Normocephalic.     Nose: Nose normal.  Eyes:     General: No scleral icterus.    Conjunctiva/sclera: Conjunctivae normal.  Neck:  Thyroid: No thyromegaly.  Cardiovascular:     Rate and Rhythm: Normal rate and regular rhythm.     Heart sounds: No murmur heard.  No friction rub. No gallop.   Pulmonary:     Breath sounds: No stridor. No wheezing or rales.  Chest:     Chest wall: No tenderness.  Abdominal:     General: There is no distension.     Tenderness: There is no abdominal tenderness. There is no rebound.  Musculoskeletal:        General: Normal range of motion.     Cervical back: Neck supple.  Lymphadenopathy:     Cervical: No cervical adenopathy.  Skin:    Findings: No erythema or rash.  Neurological:     Mental Status: She is oriented to person, place, and time.     Motor: No abnormal muscle tone.     Coordination: Coordination normal.  Psychiatric:        Behavior: Behavior normal.     ED Results / Procedures / Treatments   Labs (all labs ordered are listed, but only abnormal results are displayed) Labs Reviewed  COMPREHENSIVE METABOLIC PANEL - Abnormal; Notable for the following components:      Result Value   CO2 18 (*)    Total Protein 6.4 (*)    AST 43 (*)    ALT 46 (*)    All other components within normal limits  SARS CORONAVIRUS 2 BY RT PCR (HOSPITAL ORDER, [place] [facility])  CBC WITH DIFFERENTIAL/PLATELET  TSH  TROPONIN I (HIGH SENSITIVITY)  TROPONIN I (HIGH SENSITIVITY)    EKG EKG Interpretation  Date/Time:  Tuesday December 11 2019 07:27:26 EDT Ventricular Rate:  46 PR Interval:    QRS Duration: 137 QT Interval:  500 QTC Calculation: 438 R Axis:   90 Text Interpretation: Predominant 2:1 AV block RBBB and LPFB Confirmed by Milton Ferguson [phone]) on  12/11/2019 7:30:12 AM   Radiology DG Chest Port 1 View  Result Date: 12/11/2019 CLINICAL DATA:  Weakness EXAM: PORTABLE CHEST 1 VIEW COMPARISON:  December 08, 2018, February 06, 2015 FINDINGS: The cardiomediastinal silhouette is unchanged in contour. Tortuous thoracic aorta. Surgical clips project over the left breast and axilla. Incidental note of an azygos fissure. Biapical pleuroparenchymal scarring, unchanged. No pleural effusion. No pneumothorax. No acute pleuroparenchymal abnormality. Visualized abdomen is unremarkable. Mild degenerative changes of the thoracic spine. Remote right rib fracture. IMPRESSION: No acute cardiopulmonary abnormality. Electronically Signed   By: Valentino Saxon MD   On: 12/11/2019 08:05    Procedures Procedures (including critical care time)  Medications Ordered in ED Medications  0.9 %  sodium chloride infusion (has no administration in time range)  0.9 %  sodium chloride infusion (has no administration in time range)    ED Course  I have reviewed the triage vital signs and the nursing notes.  Pertinent labs & imaging results that were available during my care of the patient were reviewed by me and considered in my medical decision making (see chart for details).  Clinical Course as of Dec 11 1110  Tue Dec 11, 2019  0734 BP(!): 147/75 [AS]    Clinical Course User Index [AS] Sagun, Amelia, Student-PA  CRITICAL CARE Performed by: Milton Ferguson Total critical care time: 40 minutes Critical care time was exclusive of separately billable procedures and treating other patients. Critical care was necessary to treat or prevent imminent or life-threatening deterioration. Critical care was time spent personally by me on  the following activities: development of treatment plan with patient and/or surrogate as well as nursing, discussions with consultants, evaluation of patient's response to treatment, examination of patient, obtaining history from patient or  surrogate, ordering and performing treatments and interventions, ordering and review of laboratory studies, ordering and review of radiographic studies, pulse oximetry and re-evaluation of patient's condition.  MDM Rules/Calculators/A&P                         Patient has an Mobitz 2 heart block.  She has been seen by cardiology and she will be admitted to [facility] and have pacemaker placed       ,jz  This patient presents to the ED for concern of weakness, this involves an extensive number of treatment options, and is a complaint that carries with it a high risk of complications and morbidity.  The differential diagnosis includes infection MI   Lab Tests:   I Ordered, reviewed, and interpreted labs, which included CBC chemistries troponin all negative  Medicines ordered:     Imaging Studies ordered:   I ordered imaging studies which included chest x-ray and  I independently visualized and interpreted imaging which showed negative  Additional history obtained:   Additional history obtained from records  Previous records obtained and reviewed.  Consultations Obtained:   I consulted cardiology and discussed lab and imaging findings  Reevaluation:  After the interventions stated above, I reevaluated the patient and found no change  Critical Interventions:  .    Final Clinical Impression(s) / ED Diagnoses Final diagnoses:  AV block, Mobitz 2    Rx / DC Orders ED Discharge Orders    None       Milton Ferguson, MD 12/11/19 1116

## 2019-12-11 NOTE — ED Notes (Signed)
Message left [place] to return call to Dr Roderic Palau @ [phone]

## 2019-12-11 NOTE — Progress Notes (Signed)
*  PRELIMINARY RESULTS* Echocardiogram 2D Echocardiogram has been performed.  Samuel Germany 12/11/2019, 11:43 AM

## 2019-12-11 NOTE — ED Notes (Signed)
Cardiology at bedside at this time

## 2019-12-11 NOTE — Interval H&P Note (Signed)
History and Physical Interval Note:  12/11/2019 4:33 PM  Jessica Woodward  has presented today for surgery, with the diagnosis of heart block.  The various methods of treatment have been discussed with the patient and family. After consideration of risks, benefits and other options for treatment, the patient has consented to  Procedure(s): PACEMAKER IMPLANT (N/A) as a surgical intervention.  The patient's history has been reviewed, patient examined, no change in status, stable for surgery.  I have reviewed the patient's chart and labs.  Questions were answered to the patient's satisfaction.     Jessica Woodward

## 2019-12-11 NOTE — Consult Note (Addendum)
ELECTROPHYSIOLOGY CONSULT NOTE    Patient ID: [name] MRN: [id number], DOB/AGE: September 07, 1940 79 y.o.  Admit date: 12/11/2019 Date of Consult: 12/11/2019  Primary Physician: Janith Lima, MD Primary Cardiologist: No primary care provider on file.  Electrophysiologist: New to Dr. Lovena Le  Referring Provider: Dr. Harl Bowie  Patient Profile: Jessica Woodward is a 79 y.o. female with a history of HTN, HLD and left breast cancer who is being seen today for the evaluation of second degree AV block at the request of Dr. Harl Bowie.  HPI:  Jessica Woodward is a 78 y.o. female with medical history as above. She states she has had worsening weakness and fatigue x 2 days. She was especially exhausted after climbing stairs yesterday. She denies chest pain or syncopal events. She has had several episodes of near syncope. Her HR at home was in the 40s during one particular episode of near syncope.   Denies history of CAD, CHF, arrhythmias. She does have a family history of mother and father both having heart attacks.   Labwork unremarkable and patient not on AV nodal blockers. Pt transferred to [facility] for PPM implantation.  She is feeling OK currently. She has continued to have intermittent lightheadedness. Currently her rates are stable in the 60-70s  Past Medical History:  Diagnosis Date  . Arthritis   . Breast disorder    cancer  . CARCINOMA, SQUAMOUS CELL, HX OF 06/05/2007  . DUCTAL CARCINOMA IN SITU, LEFT BREAST 2002   s/p left lumpectomy, xrt and tamoxifen  . History of chemotherapy   . Hyperlipemia   . Hypertension   . Personal history of chemotherapy 2002  . Personal history of radiation therapy 2002  . RADIATION THERAPY, HX OF   . Seasonal allergies   . Skin cancer      Surgical History:  Past Surgical History:  Procedure Laterality Date  . ABDOMINAL HYSTERECTOMY    . athroscopy right knee  2013   torn miniscus  . BREAST BIOPSY Left 2002  . BREAST EXCISIONAL BIOPSY Right   .  BREAST LUMPECTOMY Left 2002   had chemo and radiation  . BREAST SURGERY     Left breast lumpectomy with axillary sential node  . DILATION AND CURETTAGE OF UTERUS    . Left knee surgery    . MOHS SURGERY     (R) arm  . right breast lupectomy    . TONSILLECTOMY AND ADENOIDECTOMY    . TUBAL LIGATION       Medications Prior to Admission  Medication Sig Dispense Refill Last Dose  . atorvastatin (LIPITOR) 40 MG tablet TAKE 1 TABLET BY MOUTH  DAILY (Patient taking differently: Take 40 mg by mouth daily. ) 90 tablet 1 12/11/2019 at Unknown time  . irbesartan (AVAPRO) 150 MG tablet TAKE 1 TABLET BY MOUTH  DAILY (Patient taking differently: Take 150 mg by mouth daily. ) 90 tablet 0 12/11/2019 at Unknown time  . loratadine (CLARITIN) 10 MG tablet Take 10 mg by mouth daily.   unknown  . meloxicam (MOBIC) 7.5 MG tablet TAKE 1 TABLET BY MOUTH  DAILY (Patient taking differently: Take 7.5 mg by mouth daily. ) 90 tablet 1 12/11/2019 at Unknown time  . Multiple Vitamin (MULTIVITAMIN) tablet Take 1 tablet by mouth daily.   12/11/2019 at Unknown time  . raloxifene (EVISTA) 60 MG tablet TAKE 1 TABLET BY MOUTH  DAILY (Patient taking differently: Take 60 mg by mouth daily. ) 90 tablet 3 12/11/2019 at Unknown time  .  benzonatate (TESSALON) 200 MG capsule Take 1 capsule (200 mg total) by mouth 3 (three) times daily as needed. (Patient not taking: Reported on 12/11/2019) 30 capsule 0 Completed Course at Unknown time  . cyclobenzaprine (FLEXERIL) 5 MG tablet Take 1 tablet (5 mg total) by mouth 3 (three) times daily as needed for muscle spasms. 90 tablet 2 unkown  . triamcinolone cream (KENALOG) 0.1 % Apply 1 application topically 4 (four) times daily as needed (rash).    unknown    Inpatient Medications:   Allergies:  Allergies  Allergen Reactions  . Anastrozole Other (See Comments) and Rash    Causes muscle weakness  . Lisinopril     cough  . Sulfasalazine Hives  . Sulfonamide Derivatives Hives    Social  History   Socioeconomic History  . Marital status: Widowed    Spouse name: Not on file  . Number of children: 2  . Years of education: Not on file  . Highest education level: Not on file  Occupational History  . Occupation: retired  Tobacco Use  . Smoking status: Former Smoker    Types: Cigarettes    Quit date: 12/28/1963    Years since quitting: 55.9  . Smokeless tobacco: Never Used  Vaping Use  . Vaping Use: Never used  Substance and Sexual Activity  . Alcohol use: Not Currently    Comment: rare glass of wine  . Drug use: No  . Sexual activity: Not Currently    Partners: Male    Birth control/protection: Surgical    Comment: hyst  Other Topics Concern  . Not on file  Social History Narrative   HSG, [employer] college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 married '60. 2 boys - '62, '73; 11 grandchildren; 3 great-grandchildren. Retired.   Social Determinants of Health   Financial Resource Strain:   . Difficulty of Paying Living Expenses:   Food Insecurity:   . Worried About Charity fundraiser in the Last Year:   . Arboriculturist in the Last Year:   Transportation Needs:   . Film/video editor (Medical):   Marland Kitchen Lack of Transportation (Non-Medical):   Physical Activity:   . Days of Exercise per Week:   . Minutes of Exercise per Session:   Stress:   . Feeling of Stress :   Social Connections:   . Frequency of Communication with Friends and Family:   . Frequency of Social Gatherings with Friends and Family:   . Attends Religious Services:   . Active Member of Clubs or Organizations:   . Attends Archivist Meetings:   Marland Kitchen Marital Status:   Intimate Partner Violence:   . Fear of Current or Ex-Partner:   . Emotionally Abused:   Marland Kitchen Physically Abused:   . Sexually Abused:      Family History  Problem Relation Age of Onset  . Coronary artery disease Mother   . Dementia Mother   . Macular degeneration Father   . Coronary artery disease Father   . Diabetes Father     . Cancer Brother        pancreatic  . Heart disease Maternal Grandmother   . Kidney disease Maternal Grandmother   . Heart disease Maternal Grandfather   . Heart disease Paternal Grandmother   . Diabetes Paternal Grandmother   . Heart disease Paternal Grandfather   . Hypertension Son   . Heart attack Son   . Hypertension Son      Review of Systems: All  other systems reviewed and are otherwise negative except as noted above.  Physical Exam: Vitals:   12/11/19 1320 12/11/19 1324 12/11/19 1425 12/11/19 1430  BP:   (!) 153/137 (!) 173/78  Pulse: 73 74 65 65  Resp: 13 15 (!) 21 14  Temp:      TempSrc:      SpO2: 99% 100% 100% 100%  Weight:      Height:        GEN- The patient is well appearing, alert and oriented x 3 today.   HEENT: normocephalic, atraumatic; sclera clear, conjunctiva pink; hearing intact; oropharynx clear; neck supple Lungs- Clear to ausculation bilaterally, normal work of breathing.  No wheezes, rales, rhonchi Heart- Regular rate and rhythm, no murmurs, rubs or gallops GI- soft, non-tender, non-distended, bowel sounds present Extremities- no clubbing, cyanosis, or edema; DP/PT/radial pulses 2+ bilaterally MS- no significant deformity or atrophy Skin- warm and dry, no rash or lesion Psych- euthymic mood, full affect Neuro- strength and sensation are intact  Labs:   Lab Results  Component Value Date   WBC 6.8 12/11/2019   HGB 13.1 12/11/2019   HCT 39.7 12/11/2019   MCV 95.2 12/11/2019   PLT 247 12/11/2019    Recent Labs  Lab 12/11/19 0807  NA 141  K 3.8  CL 111  CO2 18*  BUN 22  CREATININE 0.75  CALCIUM 9.2  PROT 6.4*  BILITOT 0.8  ALKPHOS 65  ALT 46*  AST 43*  GLUCOSE 98      Radiology/Studies: DG Chest Port 1 View  Result Date: 12/11/2019 CLINICAL DATA:  Weakness EXAM: PORTABLE CHEST 1 VIEW COMPARISON:  December 08, 2018, February 06, 2015 FINDINGS: The cardiomediastinal silhouette is unchanged in contour. Tortuous thoracic aorta.  Surgical clips project over the left breast and axilla. Incidental note of an azygos fissure. Biapical pleuroparenchymal scarring, unchanged. No pleural effusion. No pneumothorax. No acute pleuroparenchymal abnormality. Visualized abdomen is unremarkable. Mild degenerative changes of the thoracic spine. Remote right rib fracture. IMPRESSION: No acute cardiopulmonary abnormality. Electronically Signed   By: Valentino Saxon MD   On: 12/11/2019 08:05   ECHOCARDIOGRAM COMPLETE  Result Date: 12/11/2019    ECHOCARDIOGRAM REPORT   Patient Name:   TIEGAN JAMBOR Date of Exam: 12/11/2019 Medical Rec #:  [id number]      Height:       64.0 in Accession #:    [id number]     Weight:       145.0 lb Date of Birth:  1941-02-20      BSA:          1.706 m Patient Age:    65 years       BP:           157/70 mmHg Patient Gender: F              HR:           65 bpm. Exam Location:  [facility] Procedure: 2D Echo, Cardiac Doppler and Color Doppler Indications:    Heartblock I45.9  History:        Patient has no prior history of Echocardiogram examinations.                 Risk Factors:Hypertension and Dyslipidemia. DUCTAL CARCINOMA IN                 SITU, LEFT BREAST, RADIATION THERAPY, HX OF (Resolved), History  of chemotherapy.  Sonographer:    Alvino Chapel RCS Referring Phys: [id number] [place]  1. Left ventricular ejection fraction, by estimation, is 70 to 75%. The left ventricle has hyperdynamic function. The left ventricle has no regional wall motion abnormalities. Left ventricular diastolic parameters are consistent with Grade I diastolic dysfunction (impaired relaxation).  2. Right ventricular systolic function is normal. The right ventricular size is normal. There is normal pulmonary artery systolic pressure.  3. The mitral valve is normal in structure. Mild mitral valve regurgitation. No evidence of mitral stenosis.  4. The aortic valve is tricuspid. Aortic valve regurgitation is not  visualized. No aortic stenosis is present.  5. The inferior vena cava is normal in size with greater than 50% respiratory variability, suggesting right atrial pressure of 3 mmHg. FINDINGS  Left Ventricle: Left ventricular ejection fraction, by estimation, is 70 to 75%. The left ventricle has hyperdynamic function. The left ventricle has no regional wall motion abnormalities. The left ventricular internal cavity size was normal in size. There is no left ventricular hypertrophy. Left ventricular diastolic parameters are consistent with Grade I diastolic dysfunction (impaired relaxation). Normal left ventricular filling pressure. Right Ventricle: The right ventricular size is normal. No increase in right ventricular wall thickness. Right ventricular systolic function is normal. There is normal pulmonary artery systolic pressure. The tricuspid regurgitant velocity is 2.29 m/s, and  with an assumed right atrial pressure of 3 mmHg, the estimated right ventricular systolic pressure is 22.9 mmHg. Left Atrium: Left atrial size was normal in size. Right Atrium: Right atrial size was normal in size. Pericardium: Trivial pericardial effusion is present. The pericardial effusion is circumferential. Mitral Valve: The mitral valve is normal in structure. Mild mitral valve regurgitation. No evidence of mitral valve stenosis. Tricuspid Valve: The tricuspid valve is normal in structure. Tricuspid valve regurgitation is mild . No evidence of tricuspid stenosis. Aortic Valve: The aortic valve is tricuspid. . There is mild thickening and mild calcification of the aortic valve. Aortic valve regurgitation is not visualized. No aortic stenosis is present. Mild aortic valve annular calcification. There is mild thickening of the aortic valve. There is mild calcification of the aortic valve. Aortic valve mean gradient measures 3.2 mmHg. Aortic valve peak gradient measures 6.7 mmHg. Aortic valve area, by VTI measures 2.15 cm. Pulmonic Valve:  The pulmonic valve was not well visualized. Pulmonic valve regurgitation is not visualized. No evidence of pulmonic stenosis. Aorta: The aortic root is normal in size and structure. Pulmonary Artery: Indeterminant PASP, inadequate TR jet. Venous: The inferior vena cava is normal in size with greater than 50% respiratory variability, suggesting right atrial pressure of 3 mmHg. IAS/Shunts: No atrial level shunt detected by color flow Doppler.  LEFT VENTRICLE PLAX 2D LVIDd:         3.54 cm  Diastology LVIDs:         1.79 cm  LV e' lateral:   10.00 cm/s LV PW:         0.96 cm  LV E/e' lateral: 6.2 LV IVS:        0.95 cm  LV e' medial:    8.81 cm/s LVOT diam:     1.80 cm  LV E/e' medial:  7.0 LV SV:         54 LV SV Index:   32 LVOT Area:     2.54 cm  RIGHT VENTRICLE RV S prime:     12.20 cm/s TAPSE (M-mode): 1.9 cm LEFT ATRIUM  Index      RIGHT ATRIUM          Index LA diam:        2.20 cm 1.29 cm/m RA Area:     5.92 cm LA Vol (A2C):   14.2 ml 8.32 ml/m RA Volume:   8.65 ml  5.07 ml/m LA Vol (A4C):   11.3 ml 6.62 ml/m LA Biplane Vol: 12.5 ml 7.33 ml/m  AORTIC VALVE AV Area (Vmax):    2.19 cm AV Area (Vmean):   2.10 cm AV Area (VTI):     2.15 cm AV Vmax:           129.11 cm/s AV Vmean:          83.378 cm/s AV VTI:            0.252 m AV Peak Grad:      6.7 mmHg AV Mean Grad:      3.2 mmHg LVOT Vmax:         111.00 cm/s LVOT Vmean:        68.800 cm/s LVOT VTI:          0.213 m LVOT/AV VTI ratio: 0.84  AORTA Ao Root diam: 2.70 cm MITRAL VALVE               TRICUSPID VALVE MV Area (PHT): 2.31 cm    TR Peak grad:   21.0 mmHg MV Decel Time: 329 msec    TR Vmax:        229.00 cm/s MV E velocity: 61.70 cm/s MV A velocity: 97.30 cm/s  SHUNTS MV E/A ratio:  0.63        Systemic VTI:  0.21 m                            Systemic Diam: 1.80 cm Carlyle Dolly MD Electronically signed by Carlyle Dolly MD Signature Date/Time: 12/11/2019/12:59:55 PM    Final     EKG: on arrival to ED showed 2:1 AV block at 46  bpm (personally reviewed)  TELEMETRY: currently shows NSR 70-80s (personally reviewed)  Assessment/Plan: 1.  Symptomatic bradycardia / Second Degree AV block Mobitz II Pt has symptomatic bradycardia in the setting of intermittent second degree HB without reversible cause.  Echo today showed EF ~70%.  TSH normal Explained risks, benefits, and alternatives to PPM implantation, including but not limited to bleeding, infection, pneumothorax, pericardial effusion, lead dislodgement, heart attack, stroke, or death.  Pt verbalized understanding and agrees to proceed.   2. HTN Elevated. Will adjust meds as needed post PPM  3. HLD Continue statin.   Dr. Curt Bears to see.  She is on the schedule for pacemaker implantation this afternoon.   For questions or updates, please contact [place] Please consult [email or web address] for contact info under Cardiology/STEMI.  Jacalyn Lefevre, PA-C  12/11/2019 2:35 PM  EP Attending  Patient seen and examined. Agree with the findings as noted above. The patient presents with symptomatic 2:1 AV block. She is on no medications to slow her heart down. I have discussed the indications/risks/benefits/goals/expectations of PPM insertion and she wishes to proceed.  Mikle Bosworth.D.

## 2019-12-11 NOTE — Medical Student Note (Incomplete)
[facility] DEPT Provider Student Note For educational purposes for Medical, PA and NP students only and not part of the legal medical record.   CSN: [id number] Arrival date & time: 12/11/19  9323      History   Chief Complaint Chief Complaint  Patient presents with  . Weakness    HPI Jessica Woodward is a 79 y.o. female presenting with generalized weakness since Sunday. She says that she presented to the ED because when she checked her heart rate yesterday, it was 44 pre pt- she reports her heart rate is usually in the 70s. She feels like her heart is beating slo.   HPI  Past Medical History:  Diagnosis Date  . Arthritis   . Breast disorder    cancer  . CARCINOMA, SQUAMOUS CELL, HX OF 06/05/2007  . DUCTAL CARCINOMA IN SITU, LEFT BREAST 2002   s/p left lumpectomy, xrt and tamoxifen  . History of chemotherapy   . Hyperlipemia   . Hypertension   . Personal history of chemotherapy 2002  . Personal history of radiation therapy 2002  . RADIATION THERAPY, HX OF   . Seasonal allergies   . Skin cancer     Patient Active Problem List   Diagnosis Date Noted  . DDD (degenerative disc disease), cervical 05/10/2019  . Chronic renal disease, stage 3, moderately decreased glomerular filtration rate (GFR) between 30-59 mL/min/1.73 square meter 12/12/2018  . Seasonal allergic rhinitis due to pollen 02/15/2017  . Bilateral sensorineural hearing loss 12/27/2016  . Nodule of right lung 01/19/2016  . Costochondritis 12/03/2014  . Essential hypertension, benign 09/11/2013  . Routine health maintenance 01/18/2012  . Dyslipidemia, goal LDL below 130 01/17/2012  . DUCTAL CARCINOMA IN SITU, LEFT BREAST 06/05/2007    Past Surgical History:  Procedure Laterality Date  . ABDOMINAL HYSTERECTOMY    . athroscopy right knee  2013   torn miniscus  . BREAST BIOPSY Left 2002  . BREAST EXCISIONAL BIOPSY Right   . BREAST LUMPECTOMY Left 2002   had chemo and radiation  . BREAST SURGERY      Left breast lumpectomy with axillary sential node  . DILATION AND CURETTAGE OF UTERUS    . Left knee surgery    . MOHS SURGERY     (R) arm  . right breast lupectomy    . TONSILLECTOMY AND ADENOIDECTOMY    . TUBAL LIGATION      OB History    Gravida  3   Para  2   Term      Preterm      AB  1   Living  2     SAB  1   TAB      Ectopic      Multiple      Live Births  2            Home Medications    Prior to Admission medications   Medication Sig Start Date End Date Taking? Authorizing Provider  atorvastatin (LIPITOR) 40 MG tablet TAKE 1 TABLET BY MOUTH  DAILY 07/09/19   Janith Lima, MD  benzonatate (TESSALON) 200 MG capsule Take 1 capsule (200 mg total) by mouth 3 (three) times daily as needed. 11/06/19   Marrian Salvage, FNP  cefdinir (OMNICEF) 300 MG capsule Take 1 capsule (300 mg total) by mouth 2 (two) times daily. 11/06/19   Marrian Salvage, FNP  cyclobenzaprine (FLEXERIL) 5 MG tablet Take 1 tablet (5 mg total) by mouth 3 (  three) times daily as needed for muscle spasms. 11/06/19   Marrian Salvage, FNP  irbesartan (AVAPRO) 150 MG tablet TAKE 1 TABLET BY MOUTH  DAILY 10/21/19   Janith Lima, MD  loratadine (CLARITIN) 10 MG tablet Take 10 mg by mouth daily.    [provider]  meloxicam (MOBIC) 7.5 MG tablet TAKE 1 TABLET BY MOUTH  DAILY 08/12/19   Janith Lima, MD  Multiple Vitamin (MULTIVITAMIN) tablet Take 1 tablet by mouth daily.    [provider]  raloxifene (EVISTA) 60 MG tablet TAKE 1 TABLET BY MOUTH  DAILY 07/09/19   Janith Lima, MD  triamcinolone cream (KENALOG) 0.1 %  09/09/16   [provider]  meloxicam (MOBIC) 7.5 MG tablet Take 1 tablet (7.5 mg total) by mouth daily. 06/11/19   Janith Lima, MD    Family History Family History  Problem Relation Age of Onset  . Coronary artery disease Mother   . Dementia Mother   . Macular degeneration Father   . Coronary artery disease Father   .  Diabetes Father   . Cancer Brother        pancreatic  . Heart disease Maternal Grandmother   . Kidney disease Maternal Grandmother   . Heart disease Maternal Grandfather   . Heart disease Paternal Grandmother   . Diabetes Paternal Grandmother   . Heart disease Paternal Grandfather   . Hypertension Son   . Heart attack Son   . Hypertension Son     Social History Social History   Tobacco Use  . Smoking status: Former Smoker    Types: Cigarettes    Quit date: 12/28/1963    Years since quitting: 55.9  . Smokeless tobacco: Never Used  Vaping Use  . Vaping Use: Never used  Substance Use Topics  . Alcohol use: Not Currently    Comment: rare glass of wine  . Drug use: No     Allergies   Anastrozole, Lisinopril, Sulfasalazine, and Sulfonamide derivatives   Review of Systems Review of Systems   Physical Exam Updated Vital Signs BP (!) 147/75   Pulse (!) 44   Temp 98 F (36.7 C) (Oral)   Resp 13   Ht 5\' 4"  (1.626 m)   Wt 65.8 kg   SpO2 100%   BMI 24.89 kg/m   Physical Exam   ED Treatments / Results  Labs (all labs ordered are listed, but only abnormal results are displayed) Labs Reviewed  SARS CORONAVIRUS 2 BY RT PCR ([facility])  CBC WITH DIFFERENTIAL/PLATELET  COMPREHENSIVE METABOLIC PANEL  TSH  TROPONIN I (HIGH SENSITIVITY)    EKG  Radiology No results found.  Procedures Procedures (including critical care time)  Medications Ordered in ED Medications - No data to display   Initial Impression / Assessment and Plan / ED Course  I have reviewed the triage vital signs and the nursing notes.  Pertinent labs & imaging results that were available during my care of the patient were reviewed by me and considered in my medical decision making (see chart for details).  Clinical Course as of Dec 11 747  Tue Dec 11, 2019  0734 BP(!): 147/75 [AS]    Clinical Course User Index [AS] Burke Keels, Student-PA     ***  Final Clinical Impressions(s) / ED Diagnoses   Final diagnoses:  None    New Prescriptions New Prescriptions   No medications on file

## 2019-12-12 ENCOUNTER — Encounter (HOSPITAL_COMMUNITY): Payer: Self-pay | Admitting: Internal Medicine

## 2019-12-12 ENCOUNTER — Inpatient Hospital Stay (HOSPITAL_COMMUNITY): Payer: Medicare Other

## 2019-12-12 DIAGNOSIS — Z853 Personal history of malignant neoplasm of breast: Secondary | ICD-10-CM | POA: Diagnosis not present

## 2019-12-12 DIAGNOSIS — Z85828 Personal history of other malignant neoplasm of skin: Secondary | ICD-10-CM | POA: Diagnosis not present

## 2019-12-12 DIAGNOSIS — Z87891 Personal history of nicotine dependence: Secondary | ICD-10-CM | POA: Diagnosis not present

## 2019-12-12 DIAGNOSIS — I459 Conduction disorder, unspecified: Secondary | ICD-10-CM | POA: Diagnosis not present

## 2019-12-12 DIAGNOSIS — R001 Bradycardia, unspecified: Secondary | ICD-10-CM | POA: Diagnosis not present

## 2019-12-12 DIAGNOSIS — E785 Hyperlipidemia, unspecified: Secondary | ICD-10-CM | POA: Diagnosis not present

## 2019-12-12 DIAGNOSIS — Z79899 Other long term (current) drug therapy: Secondary | ICD-10-CM | POA: Diagnosis not present

## 2019-12-12 DIAGNOSIS — Z882 Allergy status to sulfonamides status: Secondary | ICD-10-CM | POA: Diagnosis not present

## 2019-12-12 DIAGNOSIS — J90 Pleural effusion, not elsewhere classified: Secondary | ICD-10-CM | POA: Diagnosis not present

## 2019-12-12 DIAGNOSIS — I1310 Hypertensive heart and chronic kidney disease without heart failure, with stage 1 through stage 4 chronic kidney disease, or unspecified chronic kidney disease: Secondary | ICD-10-CM | POA: Diagnosis not present

## 2019-12-12 DIAGNOSIS — I441 Atrioventricular block, second degree: Secondary | ICD-10-CM | POA: Diagnosis not present

## 2019-12-12 DIAGNOSIS — Z20822 Contact with and (suspected) exposure to covid-19: Secondary | ICD-10-CM | POA: Diagnosis not present

## 2019-12-12 DIAGNOSIS — N183 Chronic kidney disease, stage 3 unspecified: Secondary | ICD-10-CM | POA: Diagnosis not present

## 2019-12-12 LAB — CBC
HCT: 38.2 % (ref 36.0–46.0)
Hemoglobin: 12.7 g/dL (ref 12.0–15.0)
MCH: 31.6 pg (ref 26.0–34.0)
MCHC: 33.2 g/dL (ref 30.0–36.0)
MCV: 95 fL (ref 80.0–100.0)
Platelets: 236 10*3/uL (ref 150–400)
RBC: 4.02 MIL/uL (ref 3.87–5.11)
RDW: 13.5 % (ref 11.5–15.5)
WBC: 10.1 10*3/uL (ref 4.0–10.5)
nRBC: 0 % (ref 0.0–0.2)

## 2019-12-12 LAB — BASIC METABOLIC PANEL
Anion gap: 10 (ref 5–15)
BUN: 18 mg/dL (ref 8–23)
CO2: 21 mmol/L — ABNORMAL LOW (ref 22–32)
Calcium: 9.3 mg/dL (ref 8.9–10.3)
Chloride: 110 mmol/L (ref 98–111)
Creatinine, Ser: 0.85 mg/dL (ref 0.44–1.00)
GFR calc Af Amer: >60 mL/min (ref >60)
GFR calc non Af Amer: >60 mL/min (ref >60)
Glucose, Bld: 95 mg/dL (ref 70–99)
Potassium: 3.9 mmol/L (ref 3.5–5.1)
Sodium: 141 mmol/L (ref 135–145)

## 2019-12-12 MED ORDER — ACETAMINOPHEN 325 MG PO TABS: 325.0000 mg | ORAL_TABLET | ORAL | Status: DC | PRN

## 2019-12-12 MED FILL — Heparin Sod (Porcine)-NaCl IV Soln 1000 Unit/500ML-0.9%: INTRAVENOUS | Qty: 500 | Status: AC

## 2019-12-12 MED FILL — Lidocaine HCl Local Inj 1%: INTRAMUSCULAR | Qty: 60 | Status: AC

## 2019-12-12 MED FILL — Lidocaine HCl Local Preservative Free (PF) Inj 1%: INTRAMUSCULAR | Qty: 30 | Status: AC

## 2019-12-12 MED FILL — Cefazolin Sodium-Dextrose IV Solution 2 GM/100ML-4%: INTRAVENOUS | Qty: 100 | Status: AC

## 2019-12-12 NOTE — Care Management Obs Status (Signed)
MEDICARE OBSERVATION STATUS NOTIFICATION   Patient Details  Name: Jessica Woodward MRN: [id number] Date of Birth: [birth year]   Medicare Observation Status Notification Given:  Yes    Zenon Mayo, RN 12/12/2019, 9:56 AM

## 2019-12-12 NOTE — Plan of Care (Signed)
?  Problem: Education: ?Goal: Knowledge of General Education information will improve ?Description: Including pain rating scale, medication(s)/side effects and non-pharmacologic comfort measures ?Outcome: Progressing ?  ?Problem: Health Behavior/Discharge Planning: ?Goal: Ability to manage health-related needs will improve ?Outcome: Progressing ?  ?Problem: Clinical Measurements: ?Goal: Ability to maintain clinical measurements within normal limits will improve ?Outcome: Progressing ?  ?Problem: Clinical Measurements: ?Goal: Cardiovascular complication will be avoided ?Outcome: Progressing ?  ?Problem: Activity: ?Goal: Risk for activity intolerance will decrease ?Outcome: Progressing ?  ?

## 2019-12-12 NOTE — Care Management CC44 (Signed)
Condition Code 44 Documentation Completed  Patient Details  Name: Jessica Woodward MRN: [id number] Date of Birth: Jul 27, 1940   Condition Code 44 given:  Yes Patient signature on Condition Code 44 notice:  Yes Documentation of 2 MD's agreement:  Yes Code 44 added to claim:  Yes    Zenon Mayo, RN 12/12/2019, 9:57 AM

## 2019-12-13 ENCOUNTER — Telehealth: Payer: Self-pay | Admitting: *Deleted

## 2019-12-13 ENCOUNTER — Ambulatory Visit: Payer: Medicare Other

## 2019-12-13 NOTE — Telephone Encounter (Signed)
Pt was on TCM report admitted 12/11/19 for Symptomatic bradycardia/ Second degree HB status post pacemaker implantation this admission. Pt tolerated procedure and D/C 12/12/19. Pt will follow-up w/cardiology August 3rd.Marland KitchenJohny Woodward

## 2019-12-14 ENCOUNTER — Observation Stay (HOSPITAL_COMMUNITY)
Admission: EM | Admit: 2019-12-14 | Discharge: 2019-12-15 | Disposition: A | Discharge status: Home / Self Care | Payer: Medicare Other | Attending: Internal Medicine | Admitting: Internal Medicine

## 2019-12-14 ENCOUNTER — Encounter (HOSPITAL_COMMUNITY): Payer: Self-pay

## 2019-12-14 ENCOUNTER — Telehealth: Payer: Self-pay | Admitting: Internal Medicine

## 2019-12-14 ENCOUNTER — Other Ambulatory Visit: Payer: Self-pay

## 2019-12-14 ENCOUNTER — Emergency Department (HOSPITAL_COMMUNITY): Payer: Medicare Other

## 2019-12-14 DIAGNOSIS — N183 Chronic kidney disease, stage 3 unspecified: Secondary | ICD-10-CM | POA: Insufficient documentation

## 2019-12-14 DIAGNOSIS — R079 Chest pain, unspecified: Secondary | ICD-10-CM | POA: Diagnosis present

## 2019-12-14 DIAGNOSIS — I129 Hypertensive chronic kidney disease with stage 1 through stage 4 chronic kidney disease, or unspecified chronic kidney disease: Secondary | ICD-10-CM | POA: Diagnosis not present

## 2019-12-14 DIAGNOSIS — Z79899 Other long term (current) drug therapy: Secondary | ICD-10-CM | POA: Diagnosis not present

## 2019-12-14 DIAGNOSIS — Z853 Personal history of malignant neoplasm of breast: Secondary | ICD-10-CM | POA: Insufficient documentation

## 2019-12-14 DIAGNOSIS — I10 Essential (primary) hypertension: Secondary | ICD-10-CM | POA: Diagnosis present

## 2019-12-14 DIAGNOSIS — E785 Hyperlipidemia, unspecified: Secondary | ICD-10-CM | POA: Diagnosis not present

## 2019-12-14 DIAGNOSIS — R778 Other specified abnormalities of plasma proteins: Secondary | ICD-10-CM

## 2019-12-14 DIAGNOSIS — R0789 Other chest pain: Secondary | ICD-10-CM | POA: Diagnosis not present

## 2019-12-14 DIAGNOSIS — Z87891 Personal history of nicotine dependence: Secondary | ICD-10-CM | POA: Insufficient documentation

## 2019-12-14 DIAGNOSIS — Z20822 Contact with and (suspected) exposure to covid-19: Secondary | ICD-10-CM | POA: Insufficient documentation

## 2019-12-14 DIAGNOSIS — Z950 Presence of cardiac pacemaker: Secondary | ICD-10-CM | POA: Insufficient documentation

## 2019-12-14 DIAGNOSIS — R748 Abnormal levels of other serum enzymes: Secondary | ICD-10-CM | POA: Insufficient documentation

## 2019-12-14 DIAGNOSIS — R7989 Other specified abnormal findings of blood chemistry: Secondary | ICD-10-CM | POA: Diagnosis not present

## 2019-12-14 DIAGNOSIS — R001 Bradycardia, unspecified: Secondary | ICD-10-CM

## 2019-12-14 LAB — TROPONIN I (HIGH SENSITIVITY)
Troponin I (High Sensitivity): 59 ng/L — ABNORMAL HIGH (ref <18)
Troponin I (High Sensitivity): 53 ng/L — ABNORMAL HIGH (ref <18)
Troponin I (High Sensitivity): 48 ng/L — ABNORMAL HIGH (ref <18)

## 2019-12-14 LAB — CBC
HCT: 40 % (ref 36.0–46.0)
Hemoglobin: 13.1 g/dL (ref 12.0–15.0)
MCH: 31.3 pg (ref 26.0–34.0)
MCHC: 32.8 g/dL (ref 30.0–36.0)
MCV: 95.7 fL (ref 80.0–100.0)
Platelets: 220 10*3/uL (ref 150–400)
RBC: 4.18 MIL/uL (ref 3.87–5.11)
RDW: 13.3 % (ref 11.5–15.5)
WBC: 7.7 10*3/uL (ref 4.0–10.5)
nRBC: 0 % (ref 0.0–0.2)

## 2019-12-14 LAB — BASIC METABOLIC PANEL
Anion gap: 10 (ref 5–15)
BUN: 20 mg/dL (ref 8–23)
CO2: 20 mmol/L — ABNORMAL LOW (ref 22–32)
Calcium: 9.4 mg/dL (ref 8.9–10.3)
Chloride: 111 mmol/L (ref 98–111)
Creatinine, Ser: 0.93 mg/dL (ref 0.44–1.00)
GFR calc Af Amer: >60 mL/min (ref >60)
GFR calc non Af Amer: 59 mL/min — ABNORMAL LOW (ref >60)
Glucose, Bld: 95 mg/dL (ref 70–99)
Potassium: 3.7 mmol/L (ref 3.5–5.1)
Sodium: 141 mmol/L (ref 135–145)

## 2019-12-14 LAB — SARS CORONAVIRUS 2 BY RT PCR (HOSPITAL ORDER, PERFORMED IN ~~LOC~~ HOSPITAL LAB): SARS Coronavirus 2: NEGATIVE

## 2019-12-14 LAB — D-DIMER, QUANTITATIVE: D-Dimer, Quant: 1.12 ug/mL-FEU — ABNORMAL HIGH (ref 0.00–0.50)

## 2019-12-14 MED ORDER — NITROGLYCERIN 0.4 MG SL SUBL
0.4000 mg | SUBLINGUAL_TABLET | Freq: Once | SUBLINGUAL | Status: AC
  Administered 2019-12-14: 0.4 mg via SUBLINGUAL
  Filled 2019-12-14: qty 1

## 2019-12-14 MED ORDER — POTASSIUM CHLORIDE CRYS ER 20 MEQ PO TBCR
40.0000 meq | EXTENDED_RELEASE_TABLET | Freq: Once | ORAL | Status: AC
  Administered 2019-12-14: 40 meq via ORAL
  Filled 2019-12-14: qty 2

## 2019-12-14 MED ORDER — RALOXIFENE HCL 60 MG PO TABS
60.0000 mg | ORAL_TABLET | Freq: Every day | ORAL | Status: DC
  Administered 2019-12-15: 60 mg via ORAL
  Filled 2019-12-14: qty 1

## 2019-12-14 MED ORDER — IRBESARTAN 150 MG PO TABS
150.0000 mg | ORAL_TABLET | Freq: Every day | ORAL | Status: DC
  Administered 2019-12-15: 150 mg via ORAL
  Filled 2019-12-14: qty 1

## 2019-12-14 MED ORDER — SODIUM CHLORIDE 0.9 % IV SOLN: INTRAVENOUS | Status: DC

## 2019-12-14 MED ORDER — ATORVASTATIN CALCIUM 40 MG PO TABS
40.0000 mg | ORAL_TABLET | Freq: Every morning | ORAL | Status: DC
  Administered 2019-12-15: 40 mg via ORAL
  Filled 2019-12-14: qty 1

## 2019-12-14 MED ORDER — ASPIRIN 81 MG PO CHEW
324.0000 mg | CHEWABLE_TABLET | Freq: Once | ORAL | Status: AC
  Administered 2019-12-14: 324 mg via ORAL
  Filled 2019-12-14: qty 4

## 2019-12-14 MED ORDER — ACETAMINOPHEN 325 MG PO TABS
650.0000 mg | ORAL_TABLET | ORAL | Status: DC | PRN
  Administered 2019-12-14: 650 mg via ORAL
  Filled 2019-12-14: qty 2

## 2019-12-14 MED ORDER — PANTOPRAZOLE SODIUM 40 MG PO TBEC
40.0000 mg | DELAYED_RELEASE_TABLET | Freq: Every day | ORAL | Status: DC
  Administered 2019-12-14 – 2019-12-15 (×2): 40 mg via ORAL
  Filled 2019-12-14 (×2): qty 1

## 2019-12-14 MED ORDER — ONDANSETRON HCL 4 MG/2ML IJ SOLN: 4.0000 mg | Freq: Four times a day (QID) | INTRAMUSCULAR | Status: DC | PRN

## 2019-12-14 MED ORDER — MELOXICAM 7.5 MG PO TABS
7.5000 mg | ORAL_TABLET | Freq: Every day | ORAL | Status: DC
  Administered 2019-12-15: 7.5 mg via ORAL
  Filled 2019-12-14: qty 1

## 2019-12-14 NOTE — ED Provider Notes (Signed)
[facility] Provider Note   CSN: [id number] Arrival date & time: 12/14/19  1600     History Chief Complaint  Patient presents with  . Chest Pain    Jessica Woodward is a 79 y.o. female.  HPI   This patient is a 79 year old female, she has a known history of hypertension and hyperlipidemia, history of prior breast cancer status post lumpectomy and tamoxifen, history of recent second-degree heart block with bradycardia which was symptomatic and required a [employer] pacemaker be placed a couple of days ago.  Since that time the patient has done well, she has gone home, she was having very few symptoms, she has been keeping her left arm in a sling which she was provided.  She has been out and about including going to the hair salon and being around the house.  She has not overexerted herself.  Approximately 2 hours ago she developed a discomfort in her chest, a heaviness across her chest, a feeling of radiation to the bilateral shoulders.  She has no shortness of breath, no swelling of the legs, no fevers or coughing.  She has never had any chest pain in the past, has never seen a cardiologist until she saw Dr. Lovena Le for this heart block and has never had a stress test or heart catheterization.  She did have an echocardiogram prior to the pacemaker which showed an ejection fraction of over 70% with possible grade 1 diastolic dysfunction.  This pain has been consistent for the last 2 hours, unsure of what makes it better or worse, it came on at rest  Past Medical History:  Diagnosis Date  . Arthritis   . Breast disorder    cancer  . CARCINOMA, SQUAMOUS CELL, HX OF 06/05/2007  . DUCTAL CARCINOMA IN SITU, LEFT BREAST 2002   s/p left lumpectomy, xrt and tamoxifen  . History of chemotherapy   . Hyperlipemia   . Hypertension   . Personal history of chemotherapy 2002  . Personal history of radiation therapy 2002  . RADIATION THERAPY, HX OF   . Seasonal allergies   . Skin  cancer     Patient Active Problem List   Diagnosis Date Noted  . Heart block 12/11/2019  . DDD (degenerative disc disease), cervical 05/10/2019  . Chronic renal disease, stage 3, moderately decreased glomerular filtration rate (GFR) between 30-59 mL/min/1.73 square meter 12/12/2018  . Seasonal allergic rhinitis due to pollen 02/15/2017  . Bilateral sensorineural hearing loss 12/27/2016  . Nodule of right lung 01/19/2016  . Costochondritis 12/03/2014  . Essential hypertension, benign 09/11/2013  . Routine health maintenance 01/18/2012  . Dyslipidemia, goal LDL below 130 01/17/2012  . DUCTAL CARCINOMA IN SITU, LEFT BREAST 06/05/2007    Past Surgical History:  Procedure Laterality Date  . ABDOMINAL HYSTERECTOMY    . athroscopy right knee  2013   torn miniscus  . BREAST BIOPSY Left 2002  . BREAST EXCISIONAL BIOPSY Right   . BREAST LUMPECTOMY Left 2002   had chemo and radiation  . BREAST SURGERY     Left breast lumpectomy with axillary sential node  . DILATION AND CURETTAGE OF UTERUS    . Left knee surgery    . MOHS SURGERY     (R) arm  . PACEMAKER IMPLANT N/A 12/11/2019   Procedure: PACEMAKER IMPLANT;  Surgeon: Evans Lance, MD;  Location: [facility];  Service: Cardiovascular;  Laterality: N/A;  . right breast lupectomy    . TONSILLECTOMY AND  ADENOIDECTOMY    . TUBAL LIGATION       OB History    Gravida  3   Para  2   Term      Preterm      AB  1   Living  2     SAB  1   TAB      Ectopic      Multiple      Live Births  2           Family History  Problem Relation Age of Onset  . Coronary artery disease Mother   . Dementia Mother   . Macular degeneration Father   . Coronary artery disease Father   . Diabetes Father   . Cancer Brother        pancreatic  . Heart disease Maternal Grandmother   . Kidney disease Maternal Grandmother   . Heart disease Maternal Grandfather   . Heart disease Paternal Grandmother   . Diabetes Paternal  Grandmother   . Heart disease Paternal Grandfather   . Hypertension Son   . Heart attack Son   . Hypertension Son     Social History   Tobacco Use  . Smoking status: Former Smoker    Types: Cigarettes    Quit date: 12/28/1963    Years since quitting: 56.0  . Smokeless tobacco: Never Used  Vaping Use  . Vaping Use: Never used  Substance Use Topics  . Alcohol use: Not Currently    Comment: rare glass of wine  . Drug use: No    Home Medications Prior to Admission medications   Medication Sig Start Date End Date Taking? Authorizing Provider  acetaminophen (TYLENOL) 325 MG tablet Take 1-2 tablets (325-650 mg total) by mouth every 4 (four) hours as needed for mild pain. 12/12/19  Yes Shirley Friar, PA-C  atorvastatin (LIPITOR) 40 MG tablet TAKE 1 TABLET BY MOUTH  DAILY Patient taking differently: Take 40 mg by mouth every morning.  07/09/19  Yes Janith Lima, MD  cyclobenzaprine (FLEXERIL) 5 MG tablet Take 1 tablet (5 mg total) by mouth 3 (three) times daily as needed for muscle spasms. 11/06/19  Yes Marrian Salvage, FNP  irbesartan (AVAPRO) 150 MG tablet TAKE 1 TABLET BY MOUTH  DAILY Patient taking differently: Take 150 mg by mouth daily.  10/21/19  Yes Janith Lima, MD  loratadine (CLARITIN) 10 MG tablet Take 10 mg by mouth daily.   Yes [provider]  meloxicam (MOBIC) 7.5 MG tablet TAKE 1 TABLET BY MOUTH  DAILY Patient taking differently: Take 7.5 mg by mouth daily.  08/12/19  Yes Janith Lima, MD  Multiple Vitamin (MULTIVITAMIN) tablet Take 1 tablet by mouth daily.   Yes [provider]  raloxifene (EVISTA) 60 MG tablet TAKE 1 TABLET BY MOUTH  DAILY Patient taking differently: Take 60 mg by mouth daily.  07/09/19  Yes Janith Lima, MD  triamcinolone cream (KENALOG) 0.1 % Apply 1 application topically 4 (four) times daily as needed (rash).  09/09/16  Yes [provider]  benzonatate (TESSALON) 200 MG capsule Take 1 capsule (200 mg  total) by mouth 3 (three) times daily as needed. Patient not taking: Reported on 12/11/2019 11/06/19   Marrian Salvage, FNP  meloxicam (MOBIC) 7.5 MG tablet Take 1 tablet (7.5 mg total) by mouth daily. 06/11/19   Janith Lima, MD    Allergies    Anastrozole, Lisinopril, Sulfasalazine, and Sulfonamide derivatives  Review of  Systems   Review of Systems  All other systems reviewed and are negative.   Physical Exam Updated Vital Signs BP 108/72   Pulse 71   Temp 98.1 F (36.7 C) (Oral)   Resp 12   Ht 1.626 m (5\' 4" )   Wt 65.8 kg   SpO2 94%   BMI 24.89 kg/m   Physical Exam Vitals and nursing note reviewed.  Constitutional:      General: She is not in acute distress.    Appearance: She is well-developed.  HENT:     Head: Normocephalic and atraumatic.     Mouth/Throat:     Pharynx: No oropharyngeal exudate.  Eyes:     General: No scleral icterus.       Right eye: No discharge.        Left eye: No discharge.     Conjunctiva/sclera: Conjunctivae normal.     Pupils: Pupils are equal, round, and reactive to light.  Neck:     Thyroid: No thyromegaly.     Vascular: No JVD.  Cardiovascular:     Rate and Rhythm: Normal rate and regular rhythm.     Heart sounds: Normal heart sounds. No murmur heard.  No friction rub. No gallop.   Pulmonary:     Effort: Pulmonary effort is normal. No respiratory distress.     Breath sounds: Normal breath sounds. No wheezing or rales.  Abdominal:     General: Bowel sounds are normal. There is no distension.     Palpations: Abdomen is soft. There is no mass.     Tenderness: There is no abdominal tenderness.  Musculoskeletal:        General: No tenderness. Normal range of motion.     Cervical back: Normal range of motion and neck supple.  Lymphadenopathy:     Cervical: No cervical adenopathy.  Skin:    General: Skin is warm and dry.     Findings: No erythema or rash.  Neurological:     Mental Status: She is alert.     Coordination:  Coordination normal.  Psychiatric:        Behavior: Behavior normal.     ED Results / Procedures / Treatments   Labs (all labs ordered are listed, but only abnormal results are displayed) Labs Reviewed  BASIC METABOLIC PANEL - Abnormal; Notable for the following components:      Result Value   CO2 20 (*)    GFR calc non Af Amer 59 (*)    All other components within normal limits  TROPONIN I (HIGH SENSITIVITY) - Abnormal; Notable for the following components:   Troponin I (High Sensitivity) 59 (*)    All other components within normal limits  TROPONIN I (HIGH SENSITIVITY) - Abnormal; Notable for the following components:   Troponin I (High Sensitivity) 53 (*)    All other components within normal limits  SARS CORONAVIRUS 2 BY RT PCR [facility] ORDER, [facility])  CBC    EKG EKG Interpretation  Date/Time:  Friday December 14 2019 16:08:36 EDT Ventricular Rate:  73 PR Interval:  132 QRS Duration: 122 QT Interval:  428 QTC Calculation: 471 R Axis:   70 Text Interpretation: Atrial-sensed ventricular-paced rhythm Abnormal ECG since last tracing no significant change Confirmed by Noemi Chapel [phone]) on 12/14/2019 5:12:19 PM   Radiology DG Chest Port 1 View  Result Date: 12/14/2019 CLINICAL DATA:  Chest pain. EXAM: PORTABLE CHEST 1 VIEW COMPARISON:  December 12, 2019 FINDINGS: There is  a dual lead AICD which is stable in positioning. There is no evidence of acute infiltrate, pleural effusion or pneumothorax. Radiopaque surgical clips are seen overlying the lateral aspect of the left lung base and mid left hemithorax. The heart size and mediastinal contours are within normal limits. The visualized skeletal structures are unremarkable. IMPRESSION: No active disease. Electronically Signed   By: Virgina Norfolk M.D.   On: 12/14/2019 17:10    Procedures Procedures (including critical care time)  Medications Ordered in ED Medications  aspirin chewable tablet  324 mg (324 mg Oral Given 12/14/19 1944)  nitroGLYCERIN (NITROSTAT) SL tablet 0.4 mg (0.4 mg Sublingual Given 12/14/19 1953)    ED Course  I have reviewed the triage vital signs and the nursing notes.  Pertinent labs & imaging results that were available during my care of the patient were reviewed by me and considered in my medical decision making (see chart for details).    MDM Rules/Calculators/A&P                          I have reviewed and interpreted the patient's x-ray, there is no signs of fracture of the leads of the pacemaker.  Her lungs are clear without pneumothorax, there is no abnormal mediastinum.  Initial lab work is unremarkable except for a troponin of 59, given her recent pacemaker placement this is not necessarily abnormal and a second troponin will be needed.  Second troponin has slightly decreased to 53, I discussed the case with Dr. Golden Hurter of cardiology service who recommends that the patient be admitted for repeat troponin and an echocardiogram in the morning to make sure there is no signs of a pericardial effusion that could possibly be causing the chest discomfort or the shortness of breath associated with it.  The patient's vital signs are reassuring with an oxygen of 94% a pulse of 71 and a blood pressure of 108/72.  The patient is agreeable  Dr. [facility]ron [facility] has agreed to admit the patient.  Final Clinical Impression(s) / ED Diagnoses Final diagnoses:  Chest heaviness  Elevated troponin    Rx / DC Orders ED Discharge Orders    None       Noemi Chapel, MD 12/14/19 2022

## 2019-12-14 NOTE — Telephone Encounter (Signed)
Pt c/o of Chest Pain: STAT if CP now or developed within 24 hours  1. Are you having CP right now? Yes, it is a pressure feeling in the center, not a pain though.  2. Are you experiencing any other symptoms (ex. SOB, nausea, vomiting, sweating)? Weakness, lightheadedness, jittery. Numbness started in right arm while on the phone with the patient   3. How long have you been experiencing CP? Started 2 hours ago. Went to lay down thinking it would go away, but it hasn't   4. Is your CP continuous or coming and going? Continuous   5. Have you taken Nitroglycerin? No  ? Patient advised she checked her a few moments ago, it was BP 173/87, HR 89.

## 2019-12-14 NOTE — ED Notes (Signed)
ED Provider at bedside. 

## 2019-12-14 NOTE — Telephone Encounter (Signed)
Call was sent directly to triage. Spoke with the patient who states that she is having pressure and dullness in the center of her chest that radiates across and under both breasts. She states that pain worsens when taking a deep breath. Patient also states that she feels very weak and light headed. Complains of numbness in her right arm. She denies any nausea or SOB. Recent BP of 173/87 and HR 89. She also feels jittery and shaky. Advised patient that since she is having active chest pain with associated symptoms that she should go to the ER to be evaluated. Her husband is with her and is going to take her to [facility].

## 2019-12-14 NOTE — ED Triage Notes (Signed)
Pt to er, pt states that she was here on Tuesday and was transferred to [facility] to have a pace maker placed, states that she is here today for chest pain.

## 2019-12-14 NOTE — H&P (Addendum)
TRH H&P   Patient Demographics:    Jessica Woodward, is a 79 y.o. female  MRN: [id number]   DOB - [birth year]  Admit Date - 12/14/2019  Outpatient Primary MD for the patient is Janith Lima, MD  Referring MD/NP/PA: Dr Sabra Heck  Patient coming from: Home  Chief Complaint  Patient presents with  . Chest Pain      HPI:    Jessica Woodward  is a 79 y.o. female, with past medical history of hypertension, breast cancer status post lumpectomy and tamoxifen, hyperlipidemia, patient had recent admission secondary to bradycardia, with 2-1 AV block, for which she did require permanent pacemaker insertion on 12/11/2019, patient was discharged on 12/12/2019, patient reports she has been doing well since discharge, reports she has been compliant with keeping her left arm in sling which was provided, patient does complain of an episode of chest discomfort today, reported that last for 2 hours, started at rest, described as dullness in the middle of her chest radiating to bilateral sides, reports shoulder pain present since her permanent pacemaker surgery, denies any leg swelling, hemoptysis, fever, chills or cough, as any diaphoresis, nausea or palpitation, described this chest pain as a pleuritic, worsened by deep breath, patient during recent hospitalization had 2D echo with a preserved EF at 70%, with no regional wall motion abnormality. - in ED patient pacemaker showing paced rhythm, her initial high-sensitivity troponin was 59, repeat was 53, ED physician discussed with Dr. Radford Pax cardiology on-call at [facility] who recommended patient is stable for admission at [facility], but she did recommend 2D echo in the morning to rule out any pericardial effusion.    Review of systems:    In addition to the HPI above,  No Fever-chills, No Headache, No changes with Vision or hearing, No problems  swallowing food or Liquids, She reports chest pain, denies Cough or Shortness of Breath, No Abdominal pain, No Nausea or Vommitting, Bowel movements are regular, No Blood in stool or Urine, No dysuria, No new skin rashes or bruises, No new joints pains-aches,  No new weakness, tingling, numbness in any extremity, No recent weight gain or loss, No polyuria, polydypsia or polyphagia, No significant Mental Stressors.  A full 10 point Review of Systems was done, except as stated above, all other Review of Systems were negative.   With Past History of the following :    Past Medical History:  Diagnosis Date  . Arthritis   . Breast disorder    cancer  . CARCINOMA, SQUAMOUS CELL, HX OF 06/05/2007  . DUCTAL CARCINOMA IN SITU, LEFT BREAST 2002   s/p left lumpectomy, xrt and tamoxifen  . History of chemotherapy   . Hyperlipemia   . Hypertension   . Personal history of chemotherapy 2002  . Personal history of radiation therapy 2002  . RADIATION THERAPY, HX OF   . Seasonal allergies   .  Skin cancer       Past Surgical History:  Procedure Laterality Date  . ABDOMINAL HYSTERECTOMY    . athroscopy right knee  2013   torn miniscus  . BREAST BIOPSY Left 2002  . BREAST EXCISIONAL BIOPSY Right   . BREAST LUMPECTOMY Left 2002   had chemo and radiation  . BREAST SURGERY     Left breast lumpectomy with axillary sential node  . DILATION AND CURETTAGE OF UTERUS    . Left knee surgery    . MOHS SURGERY     (R) arm  . PACEMAKER IMPLANT N/A 12/11/2019   Procedure: PACEMAKER IMPLANT;  Surgeon: Evans Lance, MD;  Location: [place];  Service: Cardiovascular;  Laterality: N/A;  . right breast lupectomy    . TONSILLECTOMY AND ADENOIDECTOMY    . TUBAL LIGATION        Social History:     Social History   Tobacco Use  . Smoking status: Former Smoker    Types: Cigarettes    Quit date: 12/28/1963    Years since quitting: 56.0  . Smokeless tobacco: Never Used  Substance Use  Topics  . Alcohol use: Not Currently    Comment: rare glass of wine      Family History :     Family History  Problem Relation Age of Onset  . Coronary artery disease Mother   . Dementia Mother   . Macular degeneration Father   . Coronary artery disease Father   . Diabetes Father   . Cancer Brother        pancreatic  . Heart disease Maternal Grandmother   . Kidney disease Maternal Grandmother   . Heart disease Maternal Grandfather   . Heart disease Paternal Grandmother   . Diabetes Paternal Grandmother   . Heart disease Paternal Grandfather   . Hypertension Son   . Heart attack Son   . Hypertension Son     Home Medications:   Prior to Admission medications   Medication Sig Start Date End Date Taking? Authorizing Provider  acetaminophen (TYLENOL) 325 MG tablet Take 1-2 tablets (325-650 mg total) by mouth every 4 (four) hours as needed for mild pain. 12/12/19  Yes Shirley Friar, PA-C  atorvastatin (LIPITOR) 40 MG tablet TAKE 1 TABLET BY MOUTH  DAILY Patient taking differently: Take 40 mg by mouth every morning.  07/09/19  Yes Janith Lima, MD  cyclobenzaprine (FLEXERIL) 5 MG tablet Take 1 tablet (5 mg total) by mouth 3 (three) times daily as needed for muscle spasms. 11/06/19  Yes Marrian Salvage, FNP  irbesartan (AVAPRO) 150 MG tablet TAKE 1 TABLET BY MOUTH  DAILY Patient taking differently: Take 150 mg by mouth daily.  10/21/19  Yes Janith Lima, MD  loratadine (CLARITIN) 10 MG tablet Take 10 mg by mouth daily.   Yes [provider]  meloxicam (MOBIC) 7.5 MG tablet TAKE 1 TABLET BY MOUTH  DAILY Patient taking differently: Take 7.5 mg by mouth daily.  08/12/19  Yes Janith Lima, MD  Multiple Vitamin (MULTIVITAMIN) tablet Take 1 tablet by mouth daily.   Yes [provider]  raloxifene (EVISTA) 60 MG tablet TAKE 1 TABLET BY MOUTH  DAILY Patient taking differently: Take 60 mg by mouth daily.  07/09/19  Yes Janith Lima, MD  triamcinolone  cream (KENALOG) 0.1 % Apply 1 application topically 4 (four) times daily as needed (rash).  09/09/16  Yes [provider]  benzonatate (TESSALON) 200 MG  capsule Take 1 capsule (200 mg total) by mouth 3 (three) times daily as needed. Patient not taking: Reported on 12/11/2019 11/06/19   Marrian Salvage, FNP  meloxicam (MOBIC) 7.5 MG tablet Take 1 tablet (7.5 mg total) by mouth daily. 06/11/19   Janith Lima, MD     Allergies:     Allergies  Allergen Reactions  . Anastrozole Other (See Comments) and Rash    Causes muscle weakness  . Lisinopril     cough  . Sulfasalazine Hives  . Sulfonamide Derivatives Hives     Physical Exam:   Vitals  Blood pressure (!) 145/78, pulse 61, temperature 98.1 F (36.7 C), temperature source Oral, resp. rate (!) 11, height 5\' 4"  (1.626 m), weight 65.8 kg, SpO2 94 %.   1. General well developed female, laying in bed, in no apparent distress.  2. Normal affect and insight, Not Suicidal or Homicidal, Awake Alert, Oriented X 3.  3. No F.N deficits, ALL C.Nerves Intact, Strength 5/5 all 4 extremities, Sensation intact all 4 extremities, Plantars down going.  4. Ears and Eyes appear Normal, Conjunctivae clear, PERRLA. Moist Oral Mucosa.  5. Supple Neck, No JVD, No cervical lymphadenopathy appriciated, No Carotid Bruits.  6. Symmetrical Chest wall movement, Good air movement bilaterally, CTAB, upper chest pacemaker site is covered with strips, no discharge is overdosing can be seen through that.  7. RRR, No Gallops, Rubs or Murmurs, No Parasternal Heave.  8. Positive Bowel Sounds, Abdomen Soft, No tenderness, No organomegaly appriciated,No rebound -guarding or rigidity.  9.  No Cyanosis, Normal Skin Turgor, No Skin Rash or Bruise.  10. Good muscle tone,  joints appear normal , no effusions, Normal ROM.  11. No Palpable Lymph Nodes in Neck or Axillae    Data Review:    CBC Recent Labs  Lab 12/11/19 0807 12/12/19 0744  12/14/19 1642  WBC 6.8 10.1 7.7  HGB 13.1 12.7 13.1  HCT 39.7 38.2 40.0  PLT 247 236 220  MCV 95.2 95.0 95.7  MCH 31.4 31.6 31.3  MCHC 33.0 33.2 32.8  RDW 13.5 13.5 13.3  LYMPHSABS 1.6  --   --   MONOABS 0.6  --   --   EOSABS 0.1  --   --   BASOSABS 0.0  --   --    ------------------------------------------------------------------------------------------------------------------  Chemistries  Recent Labs  Lab 12/11/19 0807 12/12/19 0744 12/14/19 1642  NA 141 141 141  K 3.8 3.9 3.7  CL 111 110 111  CO2 18* 21* 20*  GLUCOSE 98 95 95  BUN 22 18 20   CREATININE 0.75 0.85 0.93  CALCIUM 9.2 9.3 9.4  AST 43*  --   --   ALT 46*  --   --   ALKPHOS 65  --   --   BILITOT 0.8  --   --    ------------------------------------------------------------------------------------------------------------------ estimated creatinine clearance is 46.5 mL/min (by C-G formula based on SCr of 0.93 mg/dL). ------------------------------------------------------------------------------------------------------------------ No results for input(s): TSH, T4TOTAL, T3FREE, THYROIDAB in the last 72 hours.  Invalid input(s): FREET3  Coagulation profile No results for input(s): INR, PROTIME in the last 168 hours. ------------------------------------------------------------------------------------------------------------------- No results for input(s): DDIMER in the last 72 hours. -------------------------------------------------------------------------------------------------------------------  Cardiac Enzymes No results for input(s): CKMB, TROPONINI, MYOGLOBIN in the last 168 hours.  Invalid input(s): CK ------------------------------------------------------------------------------------------------------------------ No results found for: BNP   ---------------------------------------------------------------------------------------------------------------  Urinalysis    Component Value Date/Time    COLORURINE YELLOW 09/11/2013 [place] 09/11/2013 0910  LABSPEC 1.015 09/11/2013 0910   PHURINE 6.0 09/11/2013 0910   GLUCOSEU NEGATIVE 09/11/2013 0910   HGBUR NEGATIVE 09/11/2013 0910   BILIRUBINUR NEGATIVE 09/11/2013 0910   KETONESUR NEGATIVE 09/11/2013 0910   UROBILINOGEN 0.2 09/11/2013 0910   NITRITE NEGATIVE 09/11/2013 0910   LEUKOCYTESUR NEGATIVE 09/11/2013 0910    ----------------------------------------------------------------------------------------------------------------   Imaging Results:    DG Chest Port 1 View  Result Date: 12/14/2019 CLINICAL DATA:  Chest pain. EXAM: PORTABLE CHEST 1 VIEW COMPARISON:  December 12, 2019 FINDINGS: There is a dual lead AICD which is stable in positioning. There is no evidence of acute infiltrate, pleural effusion or pneumothorax. Radiopaque surgical clips are seen overlying the lateral aspect of the left lung base and mid left hemithorax. The heart size and mediastinal contours are within normal limits. The visualized skeletal structures are unremarkable. IMPRESSION: No active disease. Electronically Signed   By: Virgina Norfolk M.D.   On: 12/14/2019 17:10    My personal review of EKG: Rhythm atrial sensed paced rhythm, Rate  73 /min, QTc471   Assessment & Plan:    Active Problems:   Dyslipidemia, goal LDL below 130   Essential hypertension, benign   Bradycardia   Chest pain   Chest pain -Does have some nontypical features as it does present at rest, resolved without intervention, was not associated by dyspnea, diaphoresis or palpitation. -Given her recent pacemaker insertion, and per ED discussion with Dr. Radford Pax, patient will be admitted for blurred vision overnight, will continue to cycle her cardiac enzymes, and will obtain 2D echo in the morning to rule out any pericardial infusion per cardiology recommendation. -Obtain D-dimers given she is complaining of pleuritic chest pain, and if elevated will proceed with  CTA chest to rule out PE. -High-sensitivity troponin trending down 59> 53, this is most likely elevated in the setting of recent pacemaker insertion, it is reassuring that there is trending down.  Symptomatic bradycardia with 2:1 AV Block -Status post pacemaker insertion 7/20 at [facility] -We will monitor on telemetry during hospital stay.  Hypokalemia -Repleted, recheck in a.m..  HTN - Continue with home meds.  HLD - continue with home statin.     DVT Prophylaxis SCDs  AM Labs Ordered, also please review Full Orders  Family Communication: Admission, patients condition and plan of care including tests being ordered have been discussed with the patient  who indicate understanding and agree with the plan and Code Status.  Code Status Full  Likely DC to  Home  Condition GUARDED    Consults called: ED D/W cardiology at [facility]  Admission status: observation  Time spent in minutes : 50 minutes   Phillips Climes M.D on 12/14/2019 at 8:58 PM   [facility] - Office  [phone]

## 2019-12-15 ENCOUNTER — Observation Stay (HOSPITAL_COMMUNITY): Payer: Medicare Other

## 2019-12-15 ENCOUNTER — Observation Stay (HOSPITAL_BASED_OUTPATIENT_CLINIC_OR_DEPARTMENT_OTHER): Payer: Medicare Other

## 2019-12-15 DIAGNOSIS — R079 Chest pain, unspecified: Secondary | ICD-10-CM

## 2019-12-15 DIAGNOSIS — R778 Other specified abnormalities of plasma proteins: Secondary | ICD-10-CM

## 2019-12-15 DIAGNOSIS — I700 Atherosclerosis of aorta: Secondary | ICD-10-CM | POA: Diagnosis not present

## 2019-12-15 DIAGNOSIS — I10 Essential (primary) hypertension: Secondary | ICD-10-CM | POA: Diagnosis not present

## 2019-12-15 DIAGNOSIS — R001 Bradycardia, unspecified: Secondary | ICD-10-CM | POA: Diagnosis not present

## 2019-12-15 DIAGNOSIS — J9811 Atelectasis: Secondary | ICD-10-CM | POA: Diagnosis not present

## 2019-12-15 DIAGNOSIS — Q279 Congenital malformation of peripheral vascular system, unspecified: Secondary | ICD-10-CM | POA: Diagnosis not present

## 2019-12-15 DIAGNOSIS — R0789 Other chest pain: Secondary | ICD-10-CM | POA: Diagnosis not present

## 2019-12-15 DIAGNOSIS — R7989 Other specified abnormal findings of blood chemistry: Secondary | ICD-10-CM | POA: Diagnosis not present

## 2019-12-15 LAB — ECHOCARDIOGRAM LIMITED
Area-P 1/2: 3.12 cm2
Height: 64 in
S' Lateral: 1.82 cm
Weight: 2320 oz

## 2019-12-15 LAB — TROPONIN I (HIGH SENSITIVITY)
Troponin I (High Sensitivity): 40 ng/L — ABNORMAL HIGH (ref <18)
Troponin I (High Sensitivity): 35 ng/L — ABNORMAL HIGH (ref <18)

## 2019-12-15 MED ORDER — MORPHINE SULFATE (PF) 2 MG/ML IV SOLN: 2.0000 mg | INTRAVENOUS | Status: DC | PRN

## 2019-12-15 MED ORDER — TRAMADOL HCL 50 MG PO TABS: 50.0000 mg | ORAL_TABLET | Freq: Four times a day (QID) | ORAL | Status: DC | PRN

## 2019-12-15 MED ORDER — MORPHINE SULFATE (PF) 2 MG/ML IV SOLN
2.0000 mg | Freq: Once | INTRAVENOUS | Status: AC
  Administered 2019-12-15: 2 mg via INTRAVENOUS
  Filled 2019-12-15: qty 1

## 2019-12-15 MED ORDER — GABAPENTIN 100 MG PO CAPS: 100.0000 mg | ORAL_CAPSULE | Freq: Two times a day (BID) | ORAL | 0 refills | Status: DC

## 2019-12-15 MED ORDER — IOHEXOL 350 MG/ML SOLN
100.0000 mL | Freq: Once | INTRAVENOUS | Status: AC | PRN
  Administered 2019-12-15: 100 mL via INTRAVENOUS

## 2019-12-15 MED ORDER — GABAPENTIN 100 MG PO CAPS
200.0000 mg | ORAL_CAPSULE | Freq: Three times a day (TID) | ORAL | Status: DC
  Administered 2019-12-15: 200 mg via ORAL
  Filled 2019-12-15: qty 2

## 2019-12-15 MED ORDER — MORPHINE SULFATE (PF) 2 MG/ML IV SOLN
INTRAVENOUS | Status: AC
  Administered 2019-12-15: 2 mg via INTRAVENOUS
  Filled 2019-12-15: qty 1

## 2019-12-15 NOTE — Plan of Care (Signed)
  Problem: Education: Goal: Knowledge of General Education information will improve Description: Including pain rating scale, medication(s)/side effects and non-pharmacologic comfort measures 12/15/2019 1726 by Melony Overly, RN Outcome: Adequate for Discharge 12/15/2019 1545 by Melony Overly, RN Outcome: Progressing   Problem: Health Behavior/Discharge Planning: Goal: Ability to manage health-related needs will improve 12/15/2019 1726 by Melony Overly, RN Outcome: Adequate for Discharge 12/15/2019 1545 by Melony Overly, RN Outcome: Progressing   Problem: Clinical Measurements: Goal: Ability to maintain clinical measurements within normal limits will improve 12/15/2019 1726 by Melony Overly, RN Outcome: Adequate for Discharge 12/15/2019 1545 by Melony Overly, RN Outcome: Progressing Goal: Will remain free from infection 12/15/2019 1726 by Melony Overly, RN Outcome: Adequate for Discharge 12/15/2019 1545 by Melony Overly, RN Outcome: Progressing Goal: Diagnostic test results will improve 12/15/2019 1726 by Melony Overly, RN Outcome: Adequate for Discharge 12/15/2019 1545 by Melony Overly, RN Outcome: Progressing Goal: Respiratory complications will improve 12/15/2019 1726 by Melony Overly, RN Outcome: Adequate for Discharge 12/15/2019 1545 by Melony Overly, RN Outcome: Progressing Goal: Cardiovascular complication will be avoided 12/15/2019 1726 by Melony Overly, RN Outcome: Adequate for Discharge 12/15/2019 1545 by Melony Overly, RN Outcome: Progressing   Problem: Activity: Goal: Risk for activity intolerance will decrease 12/15/2019 1726 by Melony Overly, RN Outcome: Adequate for Discharge 12/15/2019 1545 by Melony Overly, RN Outcome: Progressing   Problem: Nutrition: Goal: Adequate nutrition will be maintained 12/15/2019 1726 by Melony Overly, RN Outcome: Adequate for Discharge 12/15/2019 1545 by Melony Overly, RN Outcome: Progressing    Problem: Coping: Goal: Level of anxiety will decrease 12/15/2019 1726 by Melony Overly, RN Outcome: Adequate for Discharge 12/15/2019 1545 by Melony Overly, RN Outcome: Progressing   Problem: Elimination: Goal: Will not experience complications related to bowel motility 12/15/2019 1726 by Melony Overly, RN Outcome: Adequate for Discharge 12/15/2019 1545 by Melony Overly, RN Outcome: Progressing Goal: Will not experience complications related to urinary retention 12/15/2019 1726 by Melony Overly, RN Outcome: Adequate for Discharge 12/15/2019 1545 by Melony Overly, RN Outcome: Progressing   Problem: Pain Managment: Goal: General experience of comfort will improve 12/15/2019 1726 by Melony Overly, RN Outcome: Adequate for Discharge 12/15/2019 1545 by Melony Overly, RN Outcome: Progressing   Problem: Safety: Goal: Ability to remain free from injury will improve 12/15/2019 1726 by Melony Overly, RN Outcome: Adequate for Discharge 12/15/2019 1545 by Melony Overly, RN Outcome: Progressing   Problem: Skin Integrity: Goal: Risk for impaired skin integrity will decrease 12/15/2019 1726 by Melony Overly, RN Outcome: Adequate for Discharge 12/15/2019 1545 by Melony Overly, RN Outcome: Progressing

## 2019-12-15 NOTE — Progress Notes (Deleted)
Physician Discharge Summary  Jessica Woodward:[id number] DOB: Nov 10, 1940 DOA: 12/14/2019  PCP: Janith Lima, MD  Admit date: 12/14/2019 Discharge date: 12/15/2019  Admitted From: Home Disposition: Home  Recommendations for Outpatient Follow-up:  1. Follow up with PCP in 1-2 weeks 2. Please obtain BMP/CBC in one week 3. Follow-up with Dr. Lovena Le as previously scheduled  Home Health: Equipment/Devices:  Discharge Condition: Stable CODE STATUS: Full code Diet recommendation: Heart healthy  Brief/Interim Summary:  Jessica Woodward  is a 79 y.o. female, with past medical history of hypertension, breast cancer status post lumpectomy and tamoxifen, hyperlipidemia, patient had recent admission secondary to bradycardia, with 2-1 AV block, for which she did require permanent pacemaker insertion on 12/11/2019, patient was discharged on 12/12/2019, patient reports she has been doing well since discharge, reports she has been compliant with keeping her left arm in sling which was provided, patient does complain of an episode of chest discomfort today, reported that last for 2 hours, started at rest, described as dullness in the middle of her chest radiating to bilateral sides, reports shoulder pain present since her permanent pacemaker surgery, denies any leg swelling, hemoptysis, fever, chills or cough, as any diaphoresis, nausea or palpitation, described this chest pain as a pleuritic, worsened by deep breath, patient during recent hospitalization had 2D echo with a preserved EF at 70%, with no regional wall motion abnormality. - in ED patient pacemaker showing paced rhythm, her initial high-sensitivity troponin was 59, repeat was 53, ED physician discussed with Dr. Radford Pax cardiology on-call at [facility] who recommended patient is stable for admission at [facility], but she did recommend 2D echo in the morning to rule out any pericardial effusion.  Discharge Diagnoses:  Active Problems:    Dyslipidemia, goal LDL below 130   Essential hypertension, benign   Bradycardia   Chest pain  1. Left-sided chest discomfort.  Patient ruled out for ACS with negative cardiac markers.  D-dimer was mildly elevated, but CT did not show any evidence of pulmonary embolus.  Echocardiogram performed did not show any significant findings.  There was trivial pericardial effusion, but this was not felt to be clinically significant.  Since admission, overall symptoms have improved.  She will follow up with Dr. Lovena Le as previously scheduled. 2. Right-sided chest discomfort.  Patient describes a separate, sharp right-sided chest discomfort that radiated into her right axilla.  This began since she came to the hospital.  This was not reproducible on palpation and did not have any relation to her right shoulder.  She did not have any associated neck pain or back pain.  It was not associated with any shortness of breath.  Appears to be a neuropathic type pain.  She was started on gabapentin with improvement of her symptoms.  She did not have any evidence of underlying rash, but she has been advised to monitor for any eruption of vesicles consistent with shingles.  If she does notice any vesicles, she will contact her primary care physician in order to obtain a course of Valtrex. 3. Hypertension.  Stable on home medications 4. Hyperlipidemia.  Continue statin. 5. Symptomatic bradycardia with 2-1 AV block.  Recent pacemaker placement at [facility] on 7/20.  Follow-up with Dr. Lovena Le.  Discharge Instructions  Discharge Instructions    Diet - low sodium heart healthy   Complete by: As directed    Increase activity slowly   Complete by: As directed    No wound care   Complete by: As  directed      Allergies as of 12/15/2019      Reactions   Anastrozole Other (See Comments), Rash   Causes muscle weakness   Lisinopril    cough   Sulfasalazine Hives   Sulfonamide Derivatives Hives      Medication List     TAKE these medications   acetaminophen 325 MG tablet Commonly known as: TYLENOL Take 1-2 tablets (325-650 mg total) by mouth every 4 (four) hours as needed for mild pain.   atorvastatin 40 MG tablet Commonly known as: LIPITOR TAKE 1 TABLET BY MOUTH  DAILY What changed: when to take this   benzonatate 200 MG capsule Commonly known as: TESSALON Take 1 capsule (200 mg total) by mouth 3 (three) times daily as needed.   cyclobenzaprine 5 MG tablet Commonly known as: FLEXERIL Take 1 tablet (5 mg total) by mouth 3 (three) times daily as needed for muscle spasms.   gabapentin 100 MG capsule Commonly known as: NEURONTIN Take 1 capsule (100 mg total) by mouth 2 (two) times daily.   irbesartan 150 MG tablet Commonly known as: AVAPRO TAKE 1 TABLET BY MOUTH  DAILY   loratadine 10 MG tablet Commonly known as: CLARITIN Take 10 mg by mouth daily.   meloxicam 7.5 MG tablet Commonly known as: MOBIC TAKE 1 TABLET BY MOUTH  DAILY   multivitamin tablet Take 1 tablet by mouth daily.   raloxifene 60 MG tablet Commonly known as: EVISTA TAKE 1 TABLET BY MOUTH  DAILY   triamcinolone cream 0.1 % Commonly known as: KENALOG Apply 1 application topically 4 (four) times daily as needed (rash).       Allergies  Allergen Reactions  . Anastrozole Other (See Comments) and Rash    Causes muscle weakness  . Lisinopril     cough  . Sulfasalazine Hives  . Sulfonamide Derivatives Hives    Consultations:     Procedures/Studies: DG Chest 2 View  Result Date: 12/12/2019 CLINICAL DATA:  Pacemaker placement. EXAM: CHEST - 2 VIEW COMPARISON:  December 11, 2019. FINDINGS: The heart size and mediastinal contours are within normal limits. Both lungs are clear. Interval placement of left-sided pacemaker with leads in grossly good position. No pneumothorax or pleural effusion is noted. The visualized skeletal structures are unremarkable. IMPRESSION: Interval placement of left-sided pacemaker with leads  in grossly good position. No pneumothorax is noted. Electronically Signed   By: Marijo Conception M.D.   On: 12/12/2019 08:21   CT ANGIO CHEST PE W OR WO CONTRAST  Result Date: 12/15/2019 CLINICAL DATA:  Low to intermediate probability. Pleuritic chest pain with elevated D-dimer. EXAM: CT ANGIOGRAPHY CHEST WITH CONTRAST TECHNIQUE: Multidetector CT imaging of the chest was performed using the standard protocol during bolus administration of intravenous contrast. Multiplanar CT image reconstructions and MIPs were obtained to evaluate the vascular anatomy. CONTRAST:  162mL OMNIPAQUE IOHEXOL 350 MG/ML SOLN COMPARISON:  CT chest 12/08/2018 FINDINGS: Cardiovascular: Interval placement of a left chest wall pacer device with leads in the right atrial appendage and ventricle. The main pulmonary artery appears patent. No lobar or segmental pulmonary artery filling defects to suggest acute pulmonary embolus. Normal heart size. No pericardial effusion. Mild aortic atherosclerosis. 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 Mediastinum/Nodes: No enlarged mediastinal, hilar, or axillary lymph nodes. Thyroid gland, trachea, and esophagus demonstrate no significant findings. Lungs/Pleura: No pneumothorax identified. No pleural effusion or airspace consolidation. Atelectasis is identified within the lung bases posteriorly. Upper Abdomen: Cyst noted overlying the  scratch set cysts noted within the anterior dome of liver and in the lateral segment of left lobe. No acute abnormality noted within the imaged portions of the upper abdomen. Musculoskeletal: No chest wall abnormality. No acute or significant osseous findings. Review of the MIP images confirms the above findings. IMPRESSION: 1. No evidence for acute pulmonary embolus. 2. Bibasilar atelectasis. 3. Aortic atherosclerosis. 4. 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 Aortic Atherosclerosis (ICD10-I70.0). Electronically Signed    By: Kerby Moors M.D.   On: 12/15/2019 04:47   EP PPM/ICD IMPLANT  Result Date: 12/11/2019 CONCLUSIONS:  1. Successful implantation of a medtronic dual-chamber pacemaker for symptomatic bradycardia due to mobitz 2, second degree AV block  2. No early apparent complications.       [name], MD 12/11/2019 5:59 PM   DG Chest Port 1 View  Result Date: 12/14/2019 CLINICAL DATA:  Chest pain. EXAM: PORTABLE CHEST 1 VIEW COMPARISON:  December 12, 2019 FINDINGS: There is a dual lead AICD which is stable in positioning. There is no evidence of acute infiltrate, pleural effusion or pneumothorax. Radiopaque surgical clips are seen overlying the lateral aspect of the left lung base and mid left hemithorax. The heart size and mediastinal contours are within normal limits. The visualized skeletal structures are unremarkable. IMPRESSION: No active disease. Electronically Signed   By: Virgina Norfolk M.D.   On: 12/14/2019 17:10   DG Chest Port 1 View  Result Date: 12/11/2019 CLINICAL DATA:  Weakness EXAM: PORTABLE CHEST 1 VIEW COMPARISON:  December 08, 2018, February 06, 2015 FINDINGS: The cardiomediastinal silhouette is unchanged in contour. Tortuous thoracic aorta. Surgical clips project over the left breast and axilla. Incidental note of an azygos fissure. Biapical pleuroparenchymal scarring, unchanged. No pleural effusion. No pneumothorax. No acute pleuroparenchymal abnormality. Visualized abdomen is unremarkable. Mild degenerative changes of the thoracic spine. Remote right rib fracture. IMPRESSION: No acute cardiopulmonary abnormality. Electronically Signed   By: Valentino Saxon MD   On: 12/11/2019 08:05   ECHOCARDIOGRAM COMPLETE  Result Date: 12/11/2019    ECHOCARDIOGRAM REPORT   Patient Name:   LUTISHA KNOCHE Date of Exam: 12/11/2019 Medical Rec #:  [id number]      Height:       64.0 in Accession #:    [id number]     Weight:       145.0 lb Date of Birth:  1941-04-09      BSA:          1.706 m Patient Age:     88 years       BP:           157/70 mmHg Patient Gender: F              HR:           65 bpm. Exam Location:  [facility] Procedure: 2D Echo, Cardiac Doppler and Color Doppler Indications:    Heartblock I45.9  History:        Patient has no prior history of Echocardiogram examinations.                 Risk Factors:Hypertension and Dyslipidemia. DUCTAL CARCINOMA IN                 SITU, LEFT BREAST, RADIATION THERAPY, HX OF (Resolved), History                 of chemotherapy.  Sonographer:    Alvino Chapel RCS Referring Phys: [id number] Erma Heritage  IMPRESSIONS  1. Left ventricular ejection fraction, by estimation, is 70 to 75%. The left ventricle has hyperdynamic function. The left ventricle has no regional wall motion abnormalities. Left ventricular diastolic parameters are consistent with Grade I diastolic dysfunction (impaired relaxation).  2. Right ventricular systolic function is normal. The right ventricular size is normal. There is normal pulmonary artery systolic pressure.  3. The mitral valve is normal in structure. Mild mitral valve regurgitation. No evidence of mitral stenosis.  4. The aortic valve is tricuspid. Aortic valve regurgitation is not visualized. No aortic stenosis is present.  5. The inferior vena cava is normal in size with greater than 50% respiratory variability, suggesting right atrial pressure of 3 mmHg. FINDINGS  Left Ventricle: Left ventricular ejection fraction, by estimation, is 70 to 75%. The left ventricle has hyperdynamic function. The left ventricle has no regional wall motion abnormalities. The left ventricular internal cavity size was normal in size. There is no left ventricular hypertrophy. Left ventricular diastolic parameters are consistent with Grade I diastolic dysfunction (impaired relaxation). Normal left ventricular filling pressure. Right Ventricle: The right ventricular size is normal. No increase in right ventricular wall thickness. Right ventricular systolic  function is normal. There is normal pulmonary artery systolic pressure. The tricuspid regurgitant velocity is 2.29 m/s, and  with an assumed right atrial pressure of 3 mmHg, the estimated right ventricular systolic pressure is 85.2 mmHg. Left Atrium: Left atrial size was normal in size. Right Atrium: Right atrial size was normal in size. Pericardium: Trivial pericardial effusion is present. The pericardial effusion is circumferential. Mitral Valve: The mitral valve is normal in structure. Mild mitral valve regurgitation. No evidence of mitral valve stenosis. Tricuspid Valve: The tricuspid valve is normal in structure. Tricuspid valve regurgitation is mild . No evidence of tricuspid stenosis. Aortic Valve: The aortic valve is tricuspid. . There is mild thickening and mild calcification of the aortic valve. Aortic valve regurgitation is not visualized. No aortic stenosis is present. Mild aortic valve annular calcification. There is mild thickening of the aortic valve. There is mild calcification of the aortic valve. Aortic valve mean gradient measures 3.2 mmHg. Aortic valve peak gradient measures 6.7 mmHg. Aortic valve area, by VTI measures 2.15 cm. Pulmonic Valve: The pulmonic valve was not well visualized. Pulmonic valve regurgitation is not visualized. No evidence of pulmonic stenosis. Aorta: The aortic root is normal in size and structure. Pulmonary Artery: Indeterminant PASP, inadequate TR jet. Venous: The inferior vena cava is normal in size with greater than 50% respiratory variability, suggesting right atrial pressure of 3 mmHg. IAS/Shunts: No atrial level shunt detected by color flow Doppler.  LEFT VENTRICLE PLAX 2D LVIDd:         3.54 cm  Diastology LVIDs:         1.79 cm  LV e' lateral:   10.00 cm/s LV PW:         0.96 cm  LV E/e' lateral: 6.2 LV IVS:        0.95 cm  LV e' medial:    8.81 cm/s LVOT diam:     1.80 cm  LV E/e' medial:  7.0 LV SV:         54 LV SV Index:   32 LVOT Area:     2.54 cm  RIGHT  VENTRICLE RV S prime:     12.20 cm/s TAPSE (M-mode): 1.9 cm LEFT ATRIUM             Index  RIGHT ATRIUM          Index LA diam:        2.20 cm 1.29 cm/m RA Area:     5.92 cm LA Vol (A2C):   14.2 ml 8.32 ml/m RA Volume:   8.65 ml  5.07 ml/m LA Vol (A4C):   11.3 ml 6.62 ml/m LA Biplane Vol: 12.5 ml 7.33 ml/m  AORTIC VALVE AV Area (Vmax):    2.19 cm AV Area (Vmean):   2.10 cm AV Area (VTI):     2.15 cm AV Vmax:           129.11 cm/s AV Vmean:          83.378 cm/s AV VTI:            0.252 m AV Peak Grad:      6.7 mmHg AV Mean Grad:      3.2 mmHg LVOT Vmax:         111.00 cm/s LVOT Vmean:        68.800 cm/s LVOT VTI:          0.213 m LVOT/AV VTI ratio: 0.84  AORTA Ao Root diam: 2.70 cm MITRAL VALVE               TRICUSPID VALVE MV Area (PHT): 2.31 cm    TR Peak grad:   21.0 mmHg MV Decel Time: 329 msec    TR Vmax:        229.00 cm/s MV E velocity: 61.70 cm/s MV A velocity: 97.30 cm/s  SHUNTS MV E/A ratio:  0.63        Systemic VTI:  0.21 m                            Systemic Diam: 1.80 cm Carlyle Dolly MD Electronically signed by Carlyle Dolly MD Signature Date/Time: 12/11/2019/12:59:55 PM    Final    ECHOCARDIOGRAM LIMITED  Result Date: 12/15/2019    ECHOCARDIOGRAM LIMITED REPORT   Patient Name:   KIFFANY SCHELLING Date of Exam: 12/15/2019 Medical Rec #:  [id number]      Height:       64.0 in Accession #:    [id number]     Weight:       145.0 lb Date of Birth:  [birth year]      BSA:          1.706 m Patient Age:    27 years       BP:           123/76 mmHg Patient Gender: F              HR:           66 bpm. Exam Location:  [facility] Procedure: Limited Echo and 2D Echo Indications:    Chest Pain 786.50 / R07.9  History:        Patient has prior history of Echocardiogram examinations, most                 recent 12/11/2019. Pacemaker, Signs/Symptoms:Chest Pain; Risk                 Factors:Dyslipidemia, Hypertension and Former Smoker.                 Bradycardia, Chronic renal disease, stage 3.   Sonographer:    Leavy Cella RDCS (AE) Referring Phys: [id number] [place] IMPRESSIONS  1. Left ventricular ejection fraction,  by estimation, is 70 to 75%. The left ventricle has hyperdynamic function. The left ventricle has no regional wall motion abnormalities.  2. The inferior vena cava is normal in size with greater than 50% respiratory variability, suggesting right atrial pressure of 3 mmHg. Conclusion(s)/Recommendation(s): Limited echo for function. Technically challenging study. Normal to hyperdynamic LVEF. While no focal wall motion abnormalities were seen, there is reduced sensitivity for detection based on image quality. FINDINGS  Left Ventricle: Left ventricular ejection fraction, by estimation, is 70 to 75%. The left ventricle has hyperdynamic function. The left ventricle has no regional wall motion abnormalities. The left ventricular internal cavity size was small. There is no  left ventricular hypertrophy. Pericardium: Trivial pericardial effusion is present. Venous: The inferior vena cava is normal in size with greater than 50% respiratory variability, suggesting right atrial pressure of 3 mmHg. Additional Comments: A pacer wire is visualized in the right ventricle and right atrium. LEFT VENTRICLE PLAX 2D LVIDd:         3.31 cm LVIDs:         1.82 cm LV PW:         1.20 cm LV IVS:        1.01 cm LVOT diam:     2.00 cm LVOT Area:     3.14 cm  LEFT ATRIUM         Index LA diam:    2.50 cm 1.47 cm/m   AORTA Ao Root diam: 2.70 cm MITRAL VALVE MV Area (PHT): 3.12 cm    SHUNTS MV Decel Time: 243 msec    Systemic Diam: 2.00 cm MV E velocity: 61.30 cm/s MV A velocity: 55.70 cm/s MV E/A ratio:  1.10 Buford Dresser MD Electronically signed by Buford Dresser MD Signature Date/Time: 12/15/2019/4:26:24 PM    Final        Subjective: Patient was complaining of sharp right-sided chest discomfort that was not reproducible on palpation, did not have any relation to movement of her  shoulder and radiated into her right axilla  Discharge Exam: Vitals:   12/15/19 0239 12/15/19 0555 12/15/19 0951 12/15/19 1350  BP: (!) 142/77 (!) 131/81 123/76 (!) 114/60  Pulse: 64 68 66 67  Resp: 15 17 16 16   Temp: (!) 97.4 F (36.3 C) 98.9 F (37.2 C)  98.5 F (36.9 C)  TempSrc: Oral Oral  Oral  SpO2: 97% 97% 98% 98%  Weight:      Height:        General: Pt is alert, awake, not in acute distress Cardiovascular: RRR, S1/S2 +, no rubs, no gallops Respiratory: CTA bilaterally, no wheezing, no rhonchi Abdominal: Soft, NT, ND, bowel sounds + Extremities: no edema, no cyanosis    The results of significant diagnostics from this hospitalization (including imaging, microbiology, ancillary and laboratory) are listed below for reference.     Microbiology: Recent Results (from the past 240 hour(s))  SARS Coronavirus 2 by RT PCR (hospital order, performed in [facility] hospital lab) Nasopharyngeal Nasopharyngeal Swab     Status: None   Collection Time: 12/11/19  7:44 AM   Specimen: Nasopharyngeal Swab  Result Value Ref Range Status   SARS Coronavirus 2 NEGATIVE NEGATIVE Final    Comment: (NOTE) SARS-CoV-2 target nucleic acids are NOT DETECTED.  The SARS-CoV-2 RNA is generally detectable in upper and lower respiratory specimens during the acute phase of infection. The lowest concentration of SARS-CoV-2 viral copies this assay can detect is 250 copies / mL. A negative result does not preclude  SARS-CoV-2 infection and should not be used as the sole basis for treatment or other patient management decisions.  A negative result may occur with improper specimen collection / handling, submission of specimen other than nasopharyngeal swab, presence of viral mutation(s) within the areas targeted by this assay, and inadequate number of viral copies (<250 copies / mL). A negative result must be combined with clinical observations, patient history, and epidemiological  information.  Fact Sheet for Patients:   StrictlyIdeas.no  Fact Sheet for Healthcare Providers: BankingDealers.co.za  This test is not yet approved or  cleared by the [place] FDA and has been authorized for detection and/or diagnosis of SARS-CoV-2 by FDA under an Emergency Use Authorization (EUA).  This EUA will remain in effect (meaning this test can be used) for the duration of the COVID-19 declaration under Section 564(b)(1) of the Act, 21 U.S.C. section 360bbb-3(b)(1), unless the authorization is terminated or revoked sooner.  Performed at [facility], [address] [place], [place] [address]   SARS Coronavirus 2 by RT PCR (hospital order, performed in [facility] hospital lab) Nasopharyngeal Nasopharyngeal Swab     Status: None   Collection Time: 12/14/19  8:45 PM   Specimen: Nasopharyngeal Swab  Result Value Ref Range Status   SARS Coronavirus 2 NEGATIVE NEGATIVE Final    Comment: (NOTE) SARS-CoV-2 target nucleic acids are NOT DETECTED.  The SARS-CoV-2 RNA is generally detectable in upper and lower respiratory specimens during the acute phase of infection. The lowest concentration of SARS-CoV-2 viral copies this assay can detect is 250 copies / mL. A negative result does not preclude SARS-CoV-2 infection and should not be used as the sole basis for treatment or other patient management decisions.  A negative result may occur with improper specimen collection / handling, submission of specimen other than nasopharyngeal swab, presence of viral mutation(s) within the areas targeted by this assay, and inadequate number of viral copies (<250 copies / mL). A negative result must be combined with clinical observations, patient history, and epidemiological information.  Fact Sheet for Patients:   StrictlyIdeas.no  Fact Sheet for Healthcare Providers: BankingDealers.co.za  This  test is not yet approved or  cleared by the [place] FDA and has been authorized for detection and/or diagnosis of SARS-CoV-2 by FDA under an Emergency Use Authorization (EUA).  This EUA will remain in effect (meaning this test can be used) for the duration of the COVID-19 declaration under Section 564(b)(1) of the Act, 21 U.S.C. section 360bbb-3(b)(1), unless the authorization is terminated or revoked sooner.  Performed at [facility], [address], [place], [place] [address]      Labs: BNP (last 3 results) No results for input(s): BNP in the last 8760 hours. Basic Metabolic Panel: Recent Labs  Lab 12/11/19 0807 12/12/19 0744 12/14/19 1642  NA 141 141 141  K 3.8 3.9 3.7  CL 111 110 111  CO2 18* 21* 20*  GLUCOSE 98 95 95  BUN 22 18 20   CREATININE 0.75 0.85 0.93  CALCIUM 9.2 9.3 9.4   Liver Function Tests: Recent Labs  Lab 12/11/19 0807  AST 43*  ALT 46*  ALKPHOS 65  BILITOT 0.8  PROT 6.4*  ALBUMIN 3.8   No results for input(s): LIPASE, AMYLASE in the last 168 hours. No results for input(s): AMMONIA in the last 168 hours. CBC: Recent Labs  Lab 12/11/19 0807 12/12/19 0744 12/14/19 1642  WBC 6.8 10.1 7.7  NEUTROABS 4.5  --   --   HGB 13.1  12.7 13.1  HCT 39.7 38.2 40.0  MCV 95.2 95.0 95.7  PLT 247 236 220   Cardiac Enzymes: No results for input(s): CKTOTAL, CKMB, CKMBINDEX, TROPONINI in the last 168 hours. BNP: Invalid input(s): POCBNP CBG: No results for input(s): GLUCAP in the last 168 hours. D-Dimer Recent Labs    12/14/19 2134  DDIMER 1.12*   Hgb A1c No results for input(s): HGBA1C in the last 72 hours. Lipid Profile No results for input(s): CHOL, HDL, LDLCALC, TRIG, CHOLHDL, LDLDIRECT in the last 72 hours. Thyroid function studies No results for input(s): TSH, T4TOTAL, T3FREE, THYROIDAB in the last 72 hours.  Invalid input(s): FREET3 Anemia work up No results for input(s): VITAMINB12, FOLATE, FERRITIN, TIBC, IRON, RETICCTPCT in the  last 72 hours. Urinalysis    Component Value Date/Time   COLORURINE YELLOW 09/11/2013 0910   APPEARANCEUR CLEAR 09/11/2013 0910   LABSPEC 1.015 09/11/2013 0910   PHURINE 6.0 09/11/2013 0910   GLUCOSEU NEGATIVE 09/11/2013 0910   HGBUR NEGATIVE 09/11/2013 0910   BILIRUBINUR NEGATIVE 09/11/2013 0910   KETONESUR NEGATIVE 09/11/2013 0910   UROBILINOGEN 0.2 09/11/2013 0910   NITRITE NEGATIVE 09/11/2013 0910   LEUKOCYTESUR NEGATIVE 09/11/2013 0910   Sepsis Labs Invalid input(s): PROCALCITONIN,  WBC,  LACTICIDVEN Microbiology Recent Results (from the past 240 hour(s))  SARS Coronavirus 2 by RT PCR (hospital order, performed in [facility] hospital lab) Nasopharyngeal Nasopharyngeal Swab     Status: None   Collection Time: 12/11/19  7:44 AM   Specimen: Nasopharyngeal Swab  Result Value Ref Range Status   SARS Coronavirus 2 NEGATIVE NEGATIVE Final    Comment: (NOTE) SARS-CoV-2 target nucleic acids are NOT DETECTED.  The SARS-CoV-2 RNA is generally detectable in upper and lower respiratory specimens during the acute phase of infection. The lowest concentration of SARS-CoV-2 viral copies this assay can detect is 250 copies / mL. A negative result does not preclude SARS-CoV-2 infection and should not be used as the sole basis for treatment or other patient management decisions.  A negative result may occur with improper specimen collection / handling, submission of specimen other than nasopharyngeal swab, presence of viral mutation(s) within the areas targeted by this assay, and inadequate number of viral copies (<250 copies / mL). A negative result must be combined with clinical observations, patient history, and epidemiological information.  Fact Sheet for Patients:   StrictlyIdeas.no  Fact Sheet for Healthcare Providers: BankingDealers.co.za  This test is not yet approved or  cleared by the [place] FDA and has been authorized  for detection and/or diagnosis of SARS-CoV-2 by FDA under an Emergency Use Authorization (EUA).  This EUA will remain in effect (meaning this test can be used) for the duration of the COVID-19 declaration under Section 564(b)(1) of the Act, 21 U.S.C. section 360bbb-3(b)(1), unless the authorization is terminated or revoked sooner.  Performed at [facility], [address], [place], [place] [address]   SARS Coronavirus 2 by RT PCR (hospital order, performed in [facility] hospital lab) Nasopharyngeal Nasopharyngeal Swab     Status: None   Collection Time: 12/14/19  8:45 PM   Specimen: Nasopharyngeal Swab  Result Value Ref Range Status   SARS Coronavirus 2 NEGATIVE NEGATIVE Final    Comment: (NOTE) SARS-CoV-2 target nucleic acids are NOT DETECTED.  The SARS-CoV-2 RNA is generally detectable in upper and lower respiratory specimens during the acute phase of infection. The lowest concentration of SARS-CoV-2 viral copies this assay can detect is 250 copies / mL. A negative result does  not preclude SARS-CoV-2 infection and should not be used as the sole basis for treatment or other patient management decisions.  A negative result may occur with improper specimen collection / handling, submission of specimen other than nasopharyngeal swab, presence of viral mutation(s) within the areas targeted by this assay, and inadequate number of viral copies (<250 copies / mL). A negative result must be combined with clinical observations, patient history, and epidemiological information.  Fact Sheet for Patients:   StrictlyIdeas.no  Fact Sheet for Healthcare Providers: BankingDealers.co.za  This test is not yet approved or  cleared by the [place] FDA and has been authorized for detection and/or diagnosis of SARS-CoV-2 by FDA under an Emergency Use Authorization (EUA).  This EUA will remain in effect (meaning this test can be used) for the  duration of the COVID-19 declaration under Section 564(b)(1) of the Act, 21 U.S.C. section 360bbb-3(b)(1), unless the authorization is terminated or revoked sooner.  Performed at [facility], [address], [place], [place] [address]      Time coordinating discharge: 63mins  SIGNED:   Kathie Dike, MD  [facility] 12/15/2019, 7:48 PM   If 7PM-7AM, please contact night-coverage [email or web address]

## 2019-12-15 NOTE — Progress Notes (Signed)
Patient called out and stated she was in severe pain.  Patient stated pain was in right arm, in right arm pit and radiating to back.  Patient stated she felt as if she were burning. Notified MD and received orders for troponin and EKG.  Patient refused EKG at this time due to the pain she was in, but did agree to go to CT scan after pain eased from medication administration.

## 2019-12-15 NOTE — Progress Notes (Signed)
*  PRELIMINARY RESULTS* Echocardiogram 2D Echocardiogram LIMITED has been performed.  Jessica Woodward 12/15/2019, 10:10 AM

## 2019-12-15 NOTE — Discharge Summary (Signed)
Physician Discharge Summary  Jessica Woodward SAY:[id number] DOB: Aug 08, 1940 DOA: 12/14/2019  PCP: Janith Lima, MD  Admit date: 12/14/2019 Discharge date: 12/15/2019  Admitted From: Home Disposition: Home  Recommendations for Outpatient Follow-up:  1. Follow up with PCP in 1-2 weeks 2. Please obtain BMP/CBC in one week 3. Follow-up with Dr. Lovena Le as previously scheduled  Home Health: Equipment/Devices:  Discharge Condition: Stable CODE STATUS: Full code Diet recommendation: Heart healthy  Brief/Interim Summary:  Cristalle Rohm  is a 79 y.o. female, with past medical history of hypertension, breast cancer status post lumpectomy and tamoxifen, hyperlipidemia, patient had recent admission secondary to bradycardia, with 2-1 AV block, for which she did require permanent pacemaker insertion on 12/11/2019, patient was discharged on 12/12/2019, patient reports she has been doing well since discharge, reports she has been compliant with keeping her left arm in sling which was provided, patient does complain of an episode of chest discomfort today, reported that last for 2 hours, started at rest, described as dullness in the middle of her chest radiating to bilateral sides, reports shoulder pain present since her permanent pacemaker surgery, denies any leg swelling, hemoptysis, fever, chills or cough, as any diaphoresis, nausea or palpitation, described this chest pain as a pleuritic, worsened by deep breath, patient during recent hospitalization had 2D echo with a preserved EF at 70%, with no regional wall motion abnormality. - in ED patient pacemaker showing paced rhythm, her initial high-sensitivity troponin was 59, repeat was 53, ED physician discussed with Dr. Radford Pax cardiology on-call at [facility] who recommended patient is stable for admission at [facility], but she did recommend 2D echo in the morning to rule out any pericardial effusion.  Discharge Diagnoses:  Active Problems:    Dyslipidemia, goal LDL below 130   Essential hypertension, benign   Bradycardia   Chest pain  1. Left-sided chest discomfort.  Patient ruled out for ACS with negative cardiac markers.  D-dimer was mildly elevated, but CT did not show any evidence of pulmonary embolus.  Echocardiogram performed did not show any significant findings.  There was trivial pericardial effusion, but this was not felt to be clinically significant.  Since admission, overall symptoms have improved.  She will follow up with Dr. Lovena Le as previously scheduled. 2. Right-sided chest discomfort.  Patient describes a separate, sharp right-sided chest discomfort that radiated into her right axilla.  This began since she came to the hospital.  This was not reproducible on palpation and did not have any relation to her right shoulder.  She did not have any associated neck pain or back pain.  It was not associated with any shortness of breath.  Appears to be a neuropathic type pain.  She was started on gabapentin with improvement of her symptoms.  She did not have any evidence of underlying rash, but she has been advised to monitor for any eruption of vesicles consistent with shingles.  If she does notice any vesicles, she will contact her primary care physician in order to obtain a course of Valtrex. 3. Hypertension.  Stable on home medications 4. Hyperlipidemia.  Continue statin. 5. Symptomatic bradycardia with 2-1 AV block.  Recent pacemaker placement at [facility] on 7/20.  Follow-up with Dr. Lovena Le.  Discharge Instructions  Discharge Instructions    Diet - low sodium heart healthy   Complete by: As directed    Increase activity slowly   Complete by: As directed    No wound care   Complete by: As  directed      Allergies as of 12/15/2019      Reactions   Anastrozole Other (See Comments), Rash   Causes muscle weakness   Lisinopril    cough   Sulfasalazine Hives   Sulfonamide Derivatives Hives      Medication List     TAKE these medications   acetaminophen 325 MG tablet Commonly known as: TYLENOL Take 1-2 tablets (325-650 mg total) by mouth every 4 (four) hours as needed for mild pain.   atorvastatin 40 MG tablet Commonly known as: LIPITOR TAKE 1 TABLET BY MOUTH  DAILY What changed: when to take this   benzonatate 200 MG capsule Commonly known as: TESSALON Take 1 capsule (200 mg total) by mouth 3 (three) times daily as needed.   cyclobenzaprine 5 MG tablet Commonly known as: FLEXERIL Take 1 tablet (5 mg total) by mouth 3 (three) times daily as needed for muscle spasms.   gabapentin 100 MG capsule Commonly known as: NEURONTIN Take 1 capsule (100 mg total) by mouth 2 (two) times daily.   irbesartan 150 MG tablet Commonly known as: AVAPRO TAKE 1 TABLET BY MOUTH  DAILY   loratadine 10 MG tablet Commonly known as: CLARITIN Take 10 mg by mouth daily.   meloxicam 7.5 MG tablet Commonly known as: MOBIC TAKE 1 TABLET BY MOUTH  DAILY   multivitamin tablet Take 1 tablet by mouth daily.   raloxifene 60 MG tablet Commonly known as: EVISTA TAKE 1 TABLET BY MOUTH  DAILY   triamcinolone cream 0.1 % Commonly known as: KENALOG Apply 1 application topically 4 (four) times daily as needed (rash).       Allergies  Allergen Reactions  . Anastrozole Other (See Comments) and Rash    Causes muscle weakness  . Lisinopril     cough  . Sulfasalazine Hives  . Sulfonamide Derivatives Hives    Consultations:     Procedures/Studies: DG Chest 2 View  Result Date: 12/12/2019 CLINICAL DATA:  Pacemaker placement. EXAM: CHEST - 2 VIEW COMPARISON:  December 11, 2019. FINDINGS: The heart size and mediastinal contours are within normal limits. Both lungs are clear. Interval placement of left-sided pacemaker with leads in grossly good position. No pneumothorax or pleural effusion is noted. The visualized skeletal structures are unremarkable. IMPRESSION: Interval placement of left-sided pacemaker with leads  in grossly good position. No pneumothorax is noted. Electronically Signed   By: Marijo Conception M.D.   On: 12/12/2019 08:21   CT ANGIO CHEST PE W OR WO CONTRAST  Result Date: 12/15/2019 CLINICAL DATA:  Low to intermediate probability. Pleuritic chest pain with elevated D-dimer. EXAM: CT ANGIOGRAPHY CHEST WITH CONTRAST TECHNIQUE: Multidetector CT imaging of the chest was performed using the standard protocol during bolus administration of intravenous contrast. Multiplanar CT image reconstructions and MIPs were obtained to evaluate the vascular anatomy. CONTRAST:  156mL OMNIPAQUE IOHEXOL 350 MG/ML SOLN COMPARISON:  CT chest 12/08/2018 FINDINGS: Cardiovascular: Interval placement of a left chest wall pacer device with leads in the right atrial appendage and ventricle. The main pulmonary artery appears patent. No lobar or segmental pulmonary artery filling defects to suggest acute pulmonary embolus. Normal heart size. No pericardial effusion. Mild aortic atherosclerosis. 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 Mediastinum/Nodes: No enlarged mediastinal, hilar, or axillary lymph nodes. Thyroid gland, trachea, and esophagus demonstrate no significant findings. Lungs/Pleura: No pneumothorax identified. No pleural effusion or airspace consolidation. Atelectasis is identified within the lung bases posteriorly. Upper Abdomen: Cyst noted overlying the  scratch set cysts noted within the anterior dome of liver and in the lateral segment of left lobe. No acute abnormality noted within the imaged portions of the upper abdomen. Musculoskeletal: No chest wall abnormality. No acute or significant osseous findings. Review of the MIP images confirms the above findings. IMPRESSION: 1. No evidence for acute pulmonary embolus. 2. Bibasilar atelectasis. 3. Aortic atherosclerosis. 4. 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 Aortic Atherosclerosis (ICD10-I70.0). Electronically Signed    By: Kerby Moors M.D.   On: 12/15/2019 04:47   EP PPM/ICD IMPLANT  Result Date: 12/11/2019 CONCLUSIONS:  1. Successful implantation of a medtronic dual-chamber pacemaker for symptomatic bradycardia due to mobitz 2, second degree AV block  2. No early apparent complications.       [name], MD 12/11/2019 5:59 PM   DG Chest Port 1 View  Result Date: 12/14/2019 CLINICAL DATA:  Chest pain. EXAM: PORTABLE CHEST 1 VIEW COMPARISON:  December 12, 2019 FINDINGS: There is a dual lead AICD which is stable in positioning. There is no evidence of acute infiltrate, pleural effusion or pneumothorax. Radiopaque surgical clips are seen overlying the lateral aspect of the left lung base and mid left hemithorax. The heart size and mediastinal contours are within normal limits. The visualized skeletal structures are unremarkable. IMPRESSION: No active disease. Electronically Signed   By: Virgina Norfolk M.D.   On: 12/14/2019 17:10   DG Chest Port 1 View  Result Date: 12/11/2019 CLINICAL DATA:  Weakness EXAM: PORTABLE CHEST 1 VIEW COMPARISON:  December 08, 2018, February 06, 2015 FINDINGS: The cardiomediastinal silhouette is unchanged in contour. Tortuous thoracic aorta. Surgical clips project over the left breast and axilla. Incidental note of an azygos fissure. Biapical pleuroparenchymal scarring, unchanged. No pleural effusion. No pneumothorax. No acute pleuroparenchymal abnormality. Visualized abdomen is unremarkable. Mild degenerative changes of the thoracic spine. Remote right rib fracture. IMPRESSION: No acute cardiopulmonary abnormality. Electronically Signed   By: Valentino Saxon MD   On: 12/11/2019 08:05   ECHOCARDIOGRAM COMPLETE  Result Date: 12/11/2019    ECHOCARDIOGRAM REPORT   Patient Name:   COILA WARDELL Date of Exam: 12/11/2019 Medical Rec #:  [id number]      Height:       64.0 in Accession #:    [id number]     Weight:       145.0 lb Date of Birth:  Aug 01, 1940      BSA:          1.706 m Patient Age:     79 years       BP:           157/70 mmHg Patient Gender: F              HR:           65 bpm. Exam Location:  [facility] Procedure: 2D Echo, Cardiac Doppler and Color Doppler Indications:    Heartblock I45.9  History:        Patient has no prior history of Echocardiogram examinations.                 Risk Factors:Hypertension and Dyslipidemia. DUCTAL CARCINOMA IN                 SITU, LEFT BREAST, RADIATION THERAPY, HX OF (Resolved), History                 of chemotherapy.  Sonographer:    Alvino Chapel RCS Referring Phys: [id number] Erma Heritage  IMPRESSIONS  1. Left ventricular ejection fraction, by estimation, is 70 to 75%. The left ventricle has hyperdynamic function. The left ventricle has no regional wall motion abnormalities. Left ventricular diastolic parameters are consistent with Grade I diastolic dysfunction (impaired relaxation).  2. Right ventricular systolic function is normal. The right ventricular size is normal. There is normal pulmonary artery systolic pressure.  3. The mitral valve is normal in structure. Mild mitral valve regurgitation. No evidence of mitral stenosis.  4. The aortic valve is tricuspid. Aortic valve regurgitation is not visualized. No aortic stenosis is present.  5. The inferior vena cava is normal in size with greater than 50% respiratory variability, suggesting right atrial pressure of 3 mmHg. FINDINGS  Left Ventricle: Left ventricular ejection fraction, by estimation, is 70 to 75%. The left ventricle has hyperdynamic function. The left ventricle has no regional wall motion abnormalities. The left ventricular internal cavity size was normal in size. There is no left ventricular hypertrophy. Left ventricular diastolic parameters are consistent with Grade I diastolic dysfunction (impaired relaxation). Normal left ventricular filling pressure. Right Ventricle: The right ventricular size is normal. No increase in right ventricular wall thickness. Right ventricular systolic  function is normal. There is normal pulmonary artery systolic pressure. The tricuspid regurgitant velocity is 2.29 m/s, and  with an assumed right atrial pressure of 3 mmHg, the estimated right ventricular systolic pressure is 76.2 mmHg. Left Atrium: Left atrial size was normal in size. Right Atrium: Right atrial size was normal in size. Pericardium: Trivial pericardial effusion is present. The pericardial effusion is circumferential. Mitral Valve: The mitral valve is normal in structure. Mild mitral valve regurgitation. No evidence of mitral valve stenosis. Tricuspid Valve: The tricuspid valve is normal in structure. Tricuspid valve regurgitation is mild . No evidence of tricuspid stenosis. Aortic Valve: The aortic valve is tricuspid. . There is mild thickening and mild calcification of the aortic valve. Aortic valve regurgitation is not visualized. No aortic stenosis is present. Mild aortic valve annular calcification. There is mild thickening of the aortic valve. There is mild calcification of the aortic valve. Aortic valve mean gradient measures 3.2 mmHg. Aortic valve peak gradient measures 6.7 mmHg. Aortic valve area, by VTI measures 2.15 cm. Pulmonic Valve: The pulmonic valve was not well visualized. Pulmonic valve regurgitation is not visualized. No evidence of pulmonic stenosis. Aorta: The aortic root is normal in size and structure. Pulmonary Artery: Indeterminant PASP, inadequate TR jet. Venous: The inferior vena cava is normal in size with greater than 50% respiratory variability, suggesting right atrial pressure of 3 mmHg. IAS/Shunts: No atrial level shunt detected by color flow Doppler.  LEFT VENTRICLE PLAX 2D LVIDd:         3.54 cm  Diastology LVIDs:         1.79 cm  LV e' lateral:   10.00 cm/s LV PW:         0.96 cm  LV E/e' lateral: 6.2 LV IVS:        0.95 cm  LV e' medial:    8.81 cm/s LVOT diam:     1.80 cm  LV E/e' medial:  7.0 LV SV:         54 LV SV Index:   32 LVOT Area:     2.54 cm  RIGHT  VENTRICLE RV S prime:     12.20 cm/s TAPSE (M-mode): 1.9 cm LEFT ATRIUM             Index  RIGHT ATRIUM          Index LA diam:        2.20 cm 1.29 cm/m RA Area:     5.92 cm LA Vol (A2C):   14.2 ml 8.32 ml/m RA Volume:   8.65 ml  5.07 ml/m LA Vol (A4C):   11.3 ml 6.62 ml/m LA Biplane Vol: 12.5 ml 7.33 ml/m  AORTIC VALVE AV Area (Vmax):    2.19 cm AV Area (Vmean):   2.10 cm AV Area (VTI):     2.15 cm AV Vmax:           129.11 cm/s AV Vmean:          83.378 cm/s AV VTI:            0.252 m AV Peak Grad:      6.7 mmHg AV Mean Grad:      3.2 mmHg LVOT Vmax:         111.00 cm/s LVOT Vmean:        68.800 cm/s LVOT VTI:          0.213 m LVOT/AV VTI ratio: 0.84  AORTA Ao Root diam: 2.70 cm MITRAL VALVE               TRICUSPID VALVE MV Area (PHT): 2.31 cm    TR Peak grad:   21.0 mmHg MV Decel Time: 329 msec    TR Vmax:        229.00 cm/s MV E velocity: 61.70 cm/s MV A velocity: 97.30 cm/s  SHUNTS MV E/A ratio:  0.63        Systemic VTI:  0.21 m                            Systemic Diam: 1.80 cm Carlyle Dolly MD Electronically signed by Carlyle Dolly MD Signature Date/Time: 12/11/2019/12:59:55 PM    Final    ECHOCARDIOGRAM LIMITED  Result Date: 12/15/2019    ECHOCARDIOGRAM LIMITED REPORT   Patient Name:   DYMPHNA WADLEY Date of Exam: 12/15/2019 Medical Rec #:  [id number]      Height:       64.0 in Accession #:    [id number]     Weight:       145.0 lb Date of Birth:  1940/05/29      BSA:          1.706 m Patient Age:    78 years       BP:           123/76 mmHg Patient Gender: F              HR:           66 bpm. Exam Location:  [facility] Procedure: Limited Echo and 2D Echo Indications:    Chest Pain 786.50 / R07.9  History:        Patient has prior history of Echocardiogram examinations, most                 recent 12/11/2019. Pacemaker, Signs/Symptoms:Chest Pain; Risk                 Factors:Dyslipidemia, Hypertension and Former Smoker.                 Bradycardia, Chronic renal disease, stage 3.   Sonographer:    Leavy Cella RDCS (AE) Referring Phys: [id number] [place] IMPRESSIONS  1. Left ventricular ejection fraction,  by estimation, is 70 to 75%. The left ventricle has hyperdynamic function. The left ventricle has no regional wall motion abnormalities.  2. The inferior vena cava is normal in size with greater than 50% respiratory variability, suggesting right atrial pressure of 3 mmHg. Conclusion(s)/Recommendation(s): Limited echo for function. Technically challenging study. Normal to hyperdynamic LVEF. While no focal wall motion abnormalities were seen, there is reduced sensitivity for detection based on image quality. FINDINGS  Left Ventricle: Left ventricular ejection fraction, by estimation, is 70 to 75%. The left ventricle has hyperdynamic function. The left ventricle has no regional wall motion abnormalities. The left ventricular internal cavity size was small. There is no  left ventricular hypertrophy. Pericardium: Trivial pericardial effusion is present. Venous: The inferior vena cava is normal in size with greater than 50% respiratory variability, suggesting right atrial pressure of 3 mmHg. Additional Comments: A pacer wire is visualized in the right ventricle and right atrium. LEFT VENTRICLE PLAX 2D LVIDd:         3.31 cm LVIDs:         1.82 cm LV PW:         1.20 cm LV IVS:        1.01 cm LVOT diam:     2.00 cm LVOT Area:     3.14 cm  LEFT ATRIUM         Index LA diam:    2.50 cm 1.47 cm/m   AORTA Ao Root diam: 2.70 cm MITRAL VALVE MV Area (PHT): 3.12 cm    SHUNTS MV Decel Time: 243 msec    Systemic Diam: 2.00 cm MV E velocity: 61.30 cm/s MV A velocity: 55.70 cm/s MV E/A ratio:  1.10 Buford Dresser MD Electronically signed by Buford Dresser MD Signature Date/Time: 12/15/2019/4:26:24 PM    Final       Subjective: Patient was complaining of sharp right-sided chest discomfort that was not reproducible on palpation, did not have any relation to movement of her shoulder  and radiated into her right axilla  Discharge Exam: Vitals:   12/15/19 0239 12/15/19 0555 12/15/19 0951 12/15/19 1350  BP: (!) 142/77 (!) 131/81 123/76 (!) 114/60  Pulse: 64 68 66 67  Resp: 15 17 16 16   Temp: (!) 97.4 F (36.3 C) 98.9 F (37.2 C)  98.5 F (36.9 C)  TempSrc: Oral Oral  Oral  SpO2: 97% 97% 98% 98%  Weight:      Height:        General: Pt is alert, awake, not in acute distress Cardiovascular: RRR, S1/S2 +, no rubs, no gallops Respiratory: CTA bilaterally, no wheezing, no rhonchi Abdominal: Soft, NT, ND, bowel sounds + Extremities: no edema, no cyanosis    The results of significant diagnostics from this hospitalization (including imaging, microbiology, ancillary and laboratory) are listed below for reference.     Microbiology: Recent Results (from the past 240 hour(s))  SARS Coronavirus 2 by RT PCR (hospital order, performed in [facility] hospital lab) Nasopharyngeal Nasopharyngeal Swab     Status: None   Collection Time: 12/11/19  7:44 AM   Specimen: Nasopharyngeal Swab  Result Value Ref Range Status   SARS Coronavirus 2 NEGATIVE NEGATIVE Final    Comment: (NOTE) SARS-CoV-2 target nucleic acids are NOT DETECTED.  The SARS-CoV-2 RNA is generally detectable in upper and lower respiratory specimens during the acute phase of infection. The lowest concentration of SARS-CoV-2 viral copies this assay can detect is 250 copies / mL. A negative result does not preclude SARS-CoV-2  infection and should not be used as the sole basis for treatment or other patient management decisions.  A negative result may occur with improper specimen collection / handling, submission of specimen other than nasopharyngeal swab, presence of viral mutation(s) within the areas targeted by this assay, and inadequate number of viral copies (<250 copies / mL). A negative result must be combined with clinical observations, patient history, and epidemiological information.  Fact Sheet  for Patients:   StrictlyIdeas.no  Fact Sheet for Healthcare Providers: BankingDealers.co.za  This test is not yet approved or  cleared by the [place] FDA and has been authorized for detection and/or diagnosis of SARS-CoV-2 by FDA under an Emergency Use Authorization (EUA).  This EUA will remain in effect (meaning this test can be used) for the duration of the COVID-19 declaration under Section 564(b)(1) of the Act, 21 U.S.C. section 360bbb-3(b)(1), unless the authorization is terminated or revoked sooner.  Performed at [facility], [address] [place], [place] [address]   SARS Coronavirus 2 by RT PCR (hospital order, performed in [facility] hospital lab) Nasopharyngeal Nasopharyngeal Swab     Status: None   Collection Time: 12/14/19  8:45 PM   Specimen: Nasopharyngeal Swab  Result Value Ref Range Status   SARS Coronavirus 2 NEGATIVE NEGATIVE Final    Comment: (NOTE) SARS-CoV-2 target nucleic acids are NOT DETECTED.  The SARS-CoV-2 RNA is generally detectable in upper and lower respiratory specimens during the acute phase of infection. The lowest concentration of SARS-CoV-2 viral copies this assay can detect is 250 copies / mL. A negative result does not preclude SARS-CoV-2 infection and should not be used as the sole basis for treatment or other patient management decisions.  A negative result may occur with improper specimen collection / handling, submission of specimen other than nasopharyngeal swab, presence of viral mutation(s) within the areas targeted by this assay, and inadequate number of viral copies (<250 copies / mL). A negative result must be combined with clinical observations, patient history, and epidemiological information.  Fact Sheet for Patients:   StrictlyIdeas.no  Fact Sheet for Healthcare Providers: BankingDealers.co.za  This test is not yet approved  or  cleared by the [place] FDA and has been authorized for detection and/or diagnosis of SARS-CoV-2 by FDA under an Emergency Use Authorization (EUA).  This EUA will remain in effect (meaning this test can be used) for the duration of the COVID-19 declaration under Section 564(b)(1) of the Act, 21 U.S.C. section 360bbb-3(b)(1), unless the authorization is terminated or revoked sooner.  Performed at [facility], [address], [place], [place] [address]      Labs: BNP (last 3 results) No results for input(s): BNP in the last 8760 hours. Basic Metabolic Panel: Recent Labs  Lab 12/11/19 0807 12/12/19 0744 12/14/19 1642  NA 141 141 141  K 3.8 3.9 3.7  CL 111 110 111  CO2 18* 21* 20*  GLUCOSE 98 95 95  BUN 22 18 20   CREATININE 0.75 0.85 0.93  CALCIUM 9.2 9.3 9.4   Liver Function Tests: Recent Labs  Lab 12/11/19 0807  AST 43*  ALT 46*  ALKPHOS 65  BILITOT 0.8  PROT 6.4*  ALBUMIN 3.8   No results for input(s): LIPASE, AMYLASE in the last 168 hours. No results for input(s): AMMONIA in the last 168 hours. CBC: Recent Labs  Lab 12/11/19 0807 12/12/19 0744 12/14/19 1642  WBC 6.8 10.1 7.7  NEUTROABS 4.5  --   --   HGB 13.1 12.7  13.1  HCT 39.7 38.2 40.0  MCV 95.2 95.0 95.7  PLT 247 236 220   Cardiac Enzymes: No results for input(s): CKTOTAL, CKMB, CKMBINDEX, TROPONINI in the last 168 hours. BNP: Invalid input(s): POCBNP CBG: No results for input(s): GLUCAP in the last 168 hours. D-Dimer Recent Labs    12/14/19 2134  DDIMER 1.12*   Hgb A1c No results for input(s): HGBA1C in the last 72 hours. Lipid Profile No results for input(s): CHOL, HDL, LDLCALC, TRIG, CHOLHDL, LDLDIRECT in the last 72 hours. Thyroid function studies No results for input(s): TSH, T4TOTAL, T3FREE, THYROIDAB in the last 72 hours.  Invalid input(s): FREET3 Anemia work up No results for input(s): VITAMINB12, FOLATE, FERRITIN, TIBC, IRON, RETICCTPCT in the last 72  hours. Urinalysis    Component Value Date/Time   COLORURINE YELLOW 09/11/2013 0910   APPEARANCEUR CLEAR 09/11/2013 0910   LABSPEC 1.015 09/11/2013 0910   PHURINE 6.0 09/11/2013 0910   GLUCOSEU NEGATIVE 09/11/2013 0910   HGBUR NEGATIVE 09/11/2013 0910   BILIRUBINUR NEGATIVE 09/11/2013 0910   KETONESUR NEGATIVE 09/11/2013 0910   UROBILINOGEN 0.2 09/11/2013 0910   NITRITE NEGATIVE 09/11/2013 0910   LEUKOCYTESUR NEGATIVE 09/11/2013 0910   Sepsis Labs Invalid input(s): PROCALCITONIN,  WBC,  LACTICIDVEN Microbiology Recent Results (from the past 240 hour(s))  SARS Coronavirus 2 by RT PCR (hospital order, performed in [place] hospital lab) Nasopharyngeal Nasopharyngeal Swab     Status: None   Collection Time: 12/11/19  7:44 AM   Specimen: Nasopharyngeal Swab  Result Value Ref Range Status   SARS Coronavirus 2 NEGATIVE NEGATIVE Final    Comment: (NOTE) SARS-CoV-2 target nucleic acids are NOT DETECTED.  The SARS-CoV-2 RNA is generally detectable in upper and lower respiratory specimens during the acute phase of infection. The lowest concentration of SARS-CoV-2 viral copies this assay can detect is 250 copies / mL. A negative result does not preclude SARS-CoV-2 infection and should not be used as the sole basis for treatment or other patient management decisions.  A negative result may occur with improper specimen collection / handling, submission of specimen other than nasopharyngeal swab, presence of viral mutation(s) within the areas targeted by this assay, and inadequate number of viral copies (<250 copies / mL). A negative result must be combined with clinical observations, patient history, and epidemiological information.  Fact Sheet for Patients:   StrictlyIdeas.no  Fact Sheet for Healthcare Providers: BankingDealers.co.za  This test is not yet approved or  cleared by the [place] FDA and has been authorized for  detection and/or diagnosis of SARS-CoV-2 by FDA under an Emergency Use Authorization (EUA).  This EUA will remain in effect (meaning this test can be used) for the duration of the COVID-19 declaration under Section 564(b)(1) of the Act, 21 U.S.C. section 360bbb-3(b)(1), unless the authorization is terminated or revoked sooner.  Performed at [facility], [address] [place], [place] [address]   SARS Coronavirus 2 by RT PCR (hospital order, performed in [facility] hospital lab) Nasopharyngeal Nasopharyngeal Swab     Status: None   Collection Time: 12/14/19  8:45 PM   Specimen: Nasopharyngeal Swab  Result Value Ref Range Status   SARS Coronavirus 2 NEGATIVE NEGATIVE Final    Comment: (NOTE) SARS-CoV-2 target nucleic acids are NOT DETECTED.  The SARS-CoV-2 RNA is generally detectable in upper and lower respiratory specimens during the acute phase of infection. The lowest concentration of SARS-CoV-2 viral copies this assay can detect is 250 copies / mL. A negative result does not  preclude SARS-CoV-2 infection and should not be used as the sole basis for treatment or other patient management decisions.  A negative result may occur with improper specimen collection / handling, submission of specimen other than nasopharyngeal swab, presence of viral mutation(s) within the areas targeted by this assay, and inadequate number of viral copies (<250 copies / mL). A negative result must be combined with clinical observations, patient history, and epidemiological information.  Fact Sheet for Patients:   StrictlyIdeas.no  Fact Sheet for Healthcare Providers: BankingDealers.co.za  This test is not yet approved or  cleared by the [place] FDA and has been authorized for detection and/or diagnosis of SARS-CoV-2 by FDA under an Emergency Use Authorization (EUA).  This EUA will remain in effect (meaning this test can be used) for the duration  of the COVID-19 declaration under Section 564(b)(1) of the Act, 21 U.S.C. section 360bbb-3(b)(1), unless the authorization is terminated or revoked sooner.  Performed at [facility], [address], [place], [place] [address]      Time coordinating discharge: 42mins  SIGNED:   Kathie Dike, MD  [facility] 12/15/2019, 7:56 PM   If 7PM-7AM, please contact night-coverage [email or web address]

## 2019-12-15 NOTE — Plan of Care (Signed)

## 2019-12-16 NOTE — Telephone Encounter (Signed)
Agree 

## 2019-12-25 ENCOUNTER — Other Ambulatory Visit: Payer: Self-pay

## 2019-12-25 ENCOUNTER — Ambulatory Visit (INDEPENDENT_AMBULATORY_CARE_PROVIDER_SITE_OTHER): Payer: Medicare Other | Admitting: Emergency Medicine

## 2019-12-25 DIAGNOSIS — Z950 Presence of cardiac pacemaker: Secondary | ICD-10-CM | POA: Diagnosis not present

## 2019-12-25 DIAGNOSIS — I442 Atrioventricular block, complete: Secondary | ICD-10-CM

## 2019-12-28 LAB — CUP PACEART INCLINIC DEVICE CHECK
Battery Remaining Longevity: 136 mo
Battery Voltage: 3.22 V
Brady Statistic AP VP Percent: 1.83 %
Brady Statistic AP VS Percent: 0.01 %
Brady Statistic AS VP Percent: 97.98 %
Brady Statistic AS VS Percent: 0.17 %
Brady Statistic RA Percent Paced: 1.82 %
Brady Statistic RV Percent Paced: 99.82 %
Date Time Interrogation Session: 20210803183857
Implantable Lead Implant Date: 20210720
Implantable Lead Implant Date: 20210720
Implantable Lead Location: 753859
Implantable Lead Location: 753860
Implantable Lead Model: 3830
Implantable Lead Model: 5076
Implantable Pulse Generator Implant Date: 20210720
Lead Channel Impedance Value: 228 Ohm
Lead Channel Impedance Value: 342 Ohm
Lead Channel Impedance Value: 380 Ohm
Lead Channel Impedance Value: 475 Ohm
Lead Channel Pacing Threshold Amplitude: 0.75 V
Lead Channel Pacing Threshold Amplitude: 1.25 V
Lead Channel Pacing Threshold Pulse Width: 0.4 ms
Lead Channel Pacing Threshold Pulse Width: 0.4 ms
Lead Channel Sensing Intrinsic Amplitude: 1.5 mV
Lead Channel Setting Pacing Amplitude: 3.5 V
Lead Channel Setting Pacing Amplitude: 3.5 V
Lead Channel Setting Pacing Pulse Width: 0.4 ms
Lead Channel Setting Sensing Sensitivity: 2 mV

## 2019-12-28 NOTE — Progress Notes (Signed)
Wound check appointment. Steri-strips removed. Wound without redness or edema. Incision edges approximated, wound well healed. Normal device function. Thresholds, sensing, and impedances consistent with implant measurements. Device programmed at 3.5V with auto capture on for extra safety margin until 3 month visit. Histogram distribution appropriate for patient and level of activity. No mode switches or high ventricular rates noted. AT/AF daily burden alert enabled today. Patient educated about wound care, arm mobility, lifting restrictions, and Carelink monitor. Carelink on 03/13/20 and ROV with Dr. Lovena Le in [place] on 03/26/20.

## 2020-01-14 ENCOUNTER — Other Ambulatory Visit: Payer: Self-pay | Admitting: Internal Medicine

## 2020-01-14 DIAGNOSIS — E785 Hyperlipidemia, unspecified: Secondary | ICD-10-CM

## 2020-01-14 DIAGNOSIS — M5030 Other cervical disc degeneration, unspecified cervical region: Secondary | ICD-10-CM

## 2020-01-14 DIAGNOSIS — I10 Essential (primary) hypertension: Secondary | ICD-10-CM

## 2020-01-15 ENCOUNTER — Encounter: Payer: Self-pay | Admitting: Internal Medicine

## 2020-01-15 ENCOUNTER — Other Ambulatory Visit: Payer: Self-pay

## 2020-01-15 ENCOUNTER — Ambulatory Visit (INDEPENDENT_AMBULATORY_CARE_PROVIDER_SITE_OTHER): Payer: Medicare Other | Admitting: Internal Medicine

## 2020-01-15 VITALS — BP 134/78 | HR 78 | Temp 98.3°F | Resp 16 | Ht 64.0 in | Wt 149.0 lb

## 2020-01-15 DIAGNOSIS — E785 Hyperlipidemia, unspecified: Secondary | ICD-10-CM

## 2020-01-15 DIAGNOSIS — I10 Essential (primary) hypertension: Secondary | ICD-10-CM

## 2020-01-15 DIAGNOSIS — M5030 Other cervical disc degeneration, unspecified cervical region: Secondary | ICD-10-CM | POA: Diagnosis not present

## 2020-01-15 DIAGNOSIS — R7989 Other specified abnormal findings of blood chemistry: Secondary | ICD-10-CM | POA: Diagnosis not present

## 2020-01-15 DIAGNOSIS — N1831 Chronic kidney disease, stage 3a: Secondary | ICD-10-CM

## 2020-01-15 DIAGNOSIS — Z0000 Encounter for general adult medical examination without abnormal findings: Secondary | ICD-10-CM | POA: Diagnosis not present

## 2020-01-15 DIAGNOSIS — Z23 Encounter for immunization: Secondary | ICD-10-CM

## 2020-01-15 DIAGNOSIS — D0500 Lobular carcinoma in situ of unspecified breast: Secondary | ICD-10-CM

## 2020-01-15 MED ORDER — MELOXICAM 7.5 MG PO TABS: 7.5000 mg | ORAL_TABLET | Freq: Every day | ORAL | 1 refills | Status: DC

## 2020-01-15 MED ORDER — IRBESARTAN 150 MG PO TABS: 150.0000 mg | ORAL_TABLET | Freq: Every day | ORAL | 1 refills | Status: DC

## 2020-01-15 MED ORDER — TETANUS-DIPHTH-ACELL PERTUSSIS 5-2.5-18.5 LF-MCG/0.5 IM SUSP: 0.5000 mL | Freq: Once | INTRAMUSCULAR | 0 refills | Status: AC

## 2020-01-15 MED ORDER — ATORVASTATIN CALCIUM 40 MG PO TABS: 40.0000 mg | ORAL_TABLET | Freq: Every day | ORAL | 1 refills | Status: DC

## 2020-01-15 MED ORDER — RALOXIFENE HCL 60 MG PO TABS: 60.0000 mg | ORAL_TABLET | Freq: Every day | ORAL | 3 refills | Status: DC

## 2020-01-15 NOTE — Progress Notes (Signed)
Subjective:  Patient ID: Jessica Woodward, female    DOB: 26-Dec-1940  Age: 79 y.o. MRN: [id number]  CC: Hypertension and Hyperlipidemia  This visit occurred during the SARS-CoV-2 public health emergency.  Safety protocols were in place, including screening questions prior to the visit, additional usage of staff PPE, and extensive cleaning of exam room while observing appropriate contact time as indicated for disinfecting solutions.    HPI Jessica Woodward presents for a hosp f/up - About a month ago she developed a presyncopal episode and was found to have symptomatic bradycardia with a 2-1 AV block.  She was admitted and got a pacemaker.  She has felt well since then with no palpitations, dizziness, lightheadedness, or near syncope.  During the admission she was found to have mildly elevated liver enzymes. She complains of fatigue.   Discharge Diagnoses:  Active Problems:   Dyslipidemia, goal LDL below 130   Essential hypertension, benign   Bradycardia   Chest pain  1. Left-sided chest discomfort.  Patient ruled out for ACS with negative cardiac markers.  D-dimer was mildly elevated, but CT did not show any evidence of pulmonary embolus.  Echocardiogram performed did not show any significant findings.  There was trivial pericardial effusion, but this was not felt to be clinically significant.  Since admission, overall symptoms have improved.  She will follow up with Dr. Lovena Le as previously scheduled. 2. Right-sided chest discomfort.  Patient describes a separate, sharp right-sided chest discomfort that radiated into her right axilla.  This began since she came to the hospital.  This was not reproducible on palpation and did not have any relation to her right shoulder.  She did not have any associated neck pain or back pain.  It was not associated with any shortness of breath.  Appears to be a neuropathic type pain.  She was started on gabapentin with improvement of her symptoms.  She did not  have any evidence of underlying rash, but she has been advised to monitor for any eruption of vesicles consistent with shingles.  If she does notice any vesicles, she will contact her primary care physician in order to obtain a course of Valtrex. 3. Hypertension.  Stable on home medications 4. Hyperlipidemia.  Continue statin. 5. Symptomatic bradycardia with 2-1 AV block.  Recent pacemaker [facility] at [facility] on 7/20.  Follow-up with Dr. Lovena Le.  Outpatient Medications Prior to Visit  Medication Sig Dispense Refill  . acetaminophen (TYLENOL) 325 MG tablet Take 1-2 tablets (325-650 mg total) by mouth every 4 (four) hours as needed for mild pain.    . cyclobenzaprine (FLEXERIL) 5 MG tablet Take 1 tablet (5 mg total) by mouth 3 (three) times daily as needed for muscle spasms. 90 tablet 2  . gabapentin (NEURONTIN) 100 MG capsule Take 1 capsule (100 mg total) by mouth 2 (two) times daily. 60 capsule 0  . loratadine (CLARITIN) 10 MG tablet Take 10 mg by mouth daily.    . Multiple Vitamin (MULTIVITAMIN) tablet Take 1 tablet by mouth daily.    Marland Kitchen triamcinolone cream (KENALOG) 0.1 % Apply 1 application topically 4 (four) times daily as needed (rash).     Marland Kitchen atorvastatin (LIPITOR) 40 MG tablet TAKE 1 TABLET BY MOUTH  DAILY (Patient taking differently: Take 40 mg by mouth every morning. ) 90 tablet 1  . irbesartan (AVAPRO) 150 MG tablet TAKE 1 TABLET BY MOUTH  DAILY (Patient taking differently: Take 150 mg by mouth daily. ) 90 tablet 0  .  meloxicam (MOBIC) 7.5 MG tablet TAKE 1 TABLET BY MOUTH  DAILY (Patient taking differently: Take 7.5 mg by mouth daily. ) 90 tablet 1  . raloxifene (EVISTA) 60 MG tablet TAKE 1 TABLET BY MOUTH  DAILY (Patient taking differently: Take 60 mg by mouth daily. ) 90 tablet 3  . benzonatate (TESSALON) 200 MG capsule Take 1 capsule (200 mg total) by mouth 3 (three) times daily as needed. (Patient not taking: Reported on 12/11/2019) 30 capsule 0   No facility-administered  medications prior to visit.    ROS Review of Systems  Constitutional: Positive for fatigue. Negative for appetite change, chills, diaphoresis, fever and unexpected weight change.  HENT: Negative.   Eyes: Negative.   Respiratory: Negative for cough, chest tightness, shortness of breath and wheezing.   Cardiovascular: Negative for chest pain, palpitations and leg swelling.  Gastrointestinal: Negative for abdominal pain, constipation, diarrhea, nausea and vomiting.  Endocrine: Negative.   Genitourinary: Negative.   Musculoskeletal: Positive for neck pain. Negative for arthralgias and myalgias.  Skin: Negative.  Negative for color change and pallor.  Neurological: Negative for dizziness, weakness, light-headedness and numbness.  Hematological: Negative for adenopathy. Does not bruise/bleed easily.  Psychiatric/Behavioral: Negative.     Objective:  BP 134/78 (BP Location: Right Arm, Patient Position: Sitting, Cuff Size: Normal)   Pulse 78   Temp 98.3 F (36.8 C) (Oral)   Resp 16   Ht 5\' 4"  (1.626 m)   Wt 149 lb (67.6 kg)   SpO2 98%   BMI 25.58 kg/m   BP Readings from Last 3 Encounters:  01/15/20 134/78  12/15/19 (!) 114/60  12/12/19 105/63    Wt Readings from Last 3 Encounters:  01/15/20 149 lb (67.6 kg)  12/14/19 145 lb (65.8 kg)  12/12/19 144 lb 14.4 oz (65.7 kg)    Physical Exam Vitals reviewed.  Constitutional:      Appearance: Normal appearance.  HENT:     Nose: Nose normal.     Mouth/Throat:     Mouth: Mucous membranes are moist.  Eyes:     General: No scleral icterus.    Conjunctiva/sclera: Conjunctivae normal.  Cardiovascular:     Rate and Rhythm: Normal rate and regular rhythm.     Heart sounds: No murmur heard.   Pulmonary:     Effort: Pulmonary effort is normal.     Breath sounds: No stridor. No wheezing, rhonchi or rales.  Abdominal:     General: Abdomen is flat. Bowel sounds are normal. There is no distension.     Palpations: Abdomen is soft.  There is no hepatomegaly, splenomegaly or mass.     Tenderness: There is no abdominal tenderness.  Musculoskeletal:        General: Normal range of motion.     Cervical back: Neck supple.     Right lower leg: No edema.     Left lower leg: No edema.  Lymphadenopathy:     Cervical: No cervical adenopathy.  Skin:    General: Skin is warm and dry.  Neurological:     General: No focal deficit present.     Mental Status: She is alert.  Psychiatric:        Mood and Affect: Mood normal.        Behavior: Behavior normal.     Lab Results  Component Value Date   WBC 7.7 12/14/2019   HGB 13.1 12/14/2019   HCT 40.0 12/14/2019   PLT 220 12/14/2019   GLUCOSE 84 01/15/2020  CHOL 168 01/15/2020   TRIG 121 01/15/2020   HDL 62 01/15/2020   LDLDIRECT 166.9 03/23/2013   LDLCALC 84 01/15/2020   ALT 10 01/15/2020   AST 15 01/15/2020   NA 142 01/15/2020   K 4.5 01/15/2020   CL 108 01/15/2020   CREATININE 0.78 01/15/2020   BUN 20 01/15/2020   CO2 20 01/15/2020   TSH 2.900 12/11/2019   HGBA1C 5.4 01/17/2012    CT ANGIO CHEST PE W OR WO CONTRAST  Result Date: 12/15/2019 CLINICAL DATA:  Low to intermediate probability. Pleuritic chest pain with elevated D-dimer. EXAM: CT ANGIOGRAPHY CHEST WITH CONTRAST TECHNIQUE: Multidetector CT imaging of the chest was performed using the standard protocol during bolus administration of intravenous contrast. Multiplanar CT image reconstructions and MIPs were obtained to evaluate the vascular anatomy. CONTRAST:  178mL OMNIPAQUE IOHEXOL 350 MG/ML SOLN COMPARISON:  CT chest 12/08/2018 FINDINGS: Cardiovascular: Interval placement of a left chest wall pacer device with leads in the right atrial appendage and ventricle. The main pulmonary artery appears patent. No lobar or segmental pulmonary artery filling defects to suggest acute pulmonary embolus. Normal heart size. No pericardial effusion. Mild aortic atherosclerosis. Aberrant left subclavian artery courses  posterior to the esophagus. Mediastinum/Nodes: No enlarged mediastinal, hilar, or axillary lymph nodes. Thyroid gland, trachea, and esophagus demonstrate no significant findings. Lungs/Pleura: No pneumothorax identified. No pleural effusion or airspace consolidation. Atelectasis is identified within the lung bases posteriorly. Upper Abdomen: Cyst noted overlying the scratch set cysts noted within the anterior dome of liver and in the lateral segment of left lobe. No acute abnormality noted within the imaged portions of the upper abdomen. Musculoskeletal: No chest wall abnormality. No acute or significant osseous findings. Review of the MIP images confirms the above findings. IMPRESSION: 1. No evidence for acute pulmonary embolus. 2. Bibasilar atelectasis. 3. Aortic atherosclerosis. 4. 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 Aortic Atherosclerosis (ICD10-I70.0). Electronically Signed   By: Kerby Moors M.D.   On: 12/15/2019 04:47   DG Chest Port 1 View  Result Date: 12/14/2019 CLINICAL DATA:  Chest pain. EXAM: PORTABLE CHEST 1 VIEW COMPARISON:  December 12, 2019 FINDINGS: There is a dual lead AICD which is stable in positioning. There is no evidence of acute infiltrate, pleural effusion or pneumothorax. Radiopaque surgical clips are seen overlying the lateral aspect of the left lung base and mid left hemithorax. The heart size and mediastinal contours are within normal limits. The visualized skeletal structures are unremarkable. IMPRESSION: No active disease. Electronically Signed   By: Virgina Norfolk M.D.   On: 12/14/2019 17:10   ECHOCARDIOGRAM LIMITED  Result Date: 12/15/2019    ECHOCARDIOGRAM LIMITED REPORT   Patient Name:   Jessica Woodward Date of Exam: 12/15/2019 Medical Rec #:  [id number]      Height:       64.0 in Accession #:    [id number]     Weight:       145.0 lb Date of Birth:  [birth year]      BSA:          1.706 m Patient Age:    61 years       BP:           123/76 mmHg  Patient Gender: F              HR:           66 bpm. Exam Location:  [facility] Procedure: Limited Echo and 2D Echo Indications:  Chest Pain 786.50 / R07.9  History:        Patient has prior history of Echocardiogram examinations, most                 recent 12/11/2019. Pacemaker, Signs/Symptoms:Chest Pain; Risk                 Factors:Dyslipidemia, Hypertension and Former Smoker.                 Bradycardia, Chronic renal disease, stage 3.  Sonographer:    Leavy Cella RDCS (AE) Referring Phys: [id number] [place] IMPRESSIONS  1. Left ventricular ejection fraction, by estimation, is 70 to 75%. The left ventricle has hyperdynamic function. The left ventricle has no regional wall motion abnormalities.  2. The inferior vena cava is normal in size with greater than 50% respiratory variability, suggesting right atrial pressure of 3 mmHg. Conclusion(s)/Recommendation(s): Limited echo for function. Technically challenging study. Normal to hyperdynamic LVEF. While no focal wall motion abnormalities were seen, there is reduced sensitivity for detection based on image quality. FINDINGS  Left Ventricle: Left ventricular ejection fraction, by estimation, is 70 to 75%. The left ventricle has hyperdynamic function. The left ventricle has no regional wall motion abnormalities. The left ventricular internal cavity size was small. There is no  left ventricular hypertrophy. Pericardium: Trivial pericardial effusion is present. Venous: The inferior vena cava is normal in size with greater than 50% respiratory variability, suggesting right atrial pressure of 3 mmHg. Additional Comments: A pacer wire is visualized in the right ventricle and right atrium. LEFT VENTRICLE PLAX 2D LVIDd:         3.31 cm LVIDs:         1.82 cm LV PW:         1.20 cm LV IVS:        1.01 cm LVOT diam:     2.00 cm LVOT Area:     3.14 cm  LEFT ATRIUM         Index LA diam:    2.50 cm 1.47 cm/m   AORTA Ao Root diam: 2.70 cm MITRAL VALVE MV Area (PHT):  3.12 cm    SHUNTS MV Decel Time: 243 msec    Systemic Diam: 2.00 cm MV E velocity: 61.30 cm/s MV A velocity: 55.70 cm/s MV E/A ratio:  1.10 Buford Dresser MD Electronically signed by Buford Dresser MD Signature Date/Time: 12/15/2019/4:26:24 PM    Final     Assessment & Plan:   Jessica Woodward was seen today for hypertension and hyperlipidemia.  Diagnoses and all orders for this visit:  Need for Tdap vaccination -     Tdap ([place]) 5-2.5-18.5 LF-MCG/0.5 injection; Inject 0.5 mLs into the muscle once for 1 dose.  Dyslipidemia, goal LDL below 130- She has achieved her LDL goal and is doing well on the statin. -     atorvastatin (LIPITOR) 40 MG tablet; Take 1 tablet (40 mg total) by mouth daily. -     Lipid panel; Future -     Hepatic function panel; Future -     Hepatic function panel -     Lipid panel  Essential hypertension, benign- Her blood pressure is adequately well controlled. -     irbesartan (AVAPRO) 150 MG tablet; Take 1 tablet (150 mg total) by mouth daily. -     BASIC METABOLIC PANEL WITH GFR; Future -     BASIC METABOLIC PANEL WITH GFR  DDD (degenerative disc disease), cervical -  meloxicam (MOBIC) 7.5 MG tablet; Take 1 tablet (7.5 mg total) by mouth daily.  Lobular carcinoma in situ (LCIS) of breast -     raloxifene (EVISTA) 60 MG tablet; Take 1 tablet (60 mg total) by mouth daily.  Routine health maintenance- Exam completed, labs reviewed, vaccines reviewed and updated, cancer screenings are up-to-date, patient education was given.  Stage 3a chronic kidney disease- Her renal function is stable. -     BASIC METABOLIC PANEL WITH GFR; Future -     Urinalysis, Routine w reflex microscopic; Future -     Urinalysis, Routine w reflex microscopic -     BASIC METABOLIC PANEL WITH GFR  Elevated LFTs- Her LFTs are normal now.  Testing for hepatitis C infection was negative. -     Hepatic function panel; Future -     Hepatitis C antibody; Future -     Hepatitis C  antibody -     Hepatic function panel  Other orders -     MICROSCOPIC MESSAGE   I have discontinued Jessica Look C. Qin "Sandy"'s benzonatate. I have also changed her atorvastatin, irbesartan, meloxicam, and raloxifene. Additionally, I am having her start on Tdap. Lastly, I am having her maintain her triamcinolone cream, multivitamin, loratadine, cyclobenzaprine, acetaminophen, and gabapentin.  Meds ordered this encounter  Medications  . atorvastatin (LIPITOR) 40 MG tablet    Sig: Take 1 tablet (40 mg total) by mouth daily.    Dispense:  90 tablet    Refill:  1  . irbesartan (AVAPRO) 150 MG tablet    Sig: Take 1 tablet (150 mg total) by mouth daily.    Dispense:  90 tablet    Refill:  1  . meloxicam (MOBIC) 7.5 MG tablet    Sig: Take 1 tablet (7.5 mg total) by mouth daily.    Dispense:  90 tablet    Refill:  1  . raloxifene (EVISTA) 60 MG tablet    Sig: Take 1 tablet (60 mg total) by mouth daily.    Dispense:  90 tablet    Refill:  3  . Tdap ([place]RIX) 5-2.5-18.5 LF-MCG/0.5 injection    Sig: Inject 0.5 mLs into the muscle once for 1 dose.    Dispense:  0.5 mL    Refill:  0     Follow-up: Return in about 6 months (around 07/17/2020).  Scarlette Calico, MD

## 2020-01-15 NOTE — Patient Instructions (Signed)
Health Maintenance, Female Adopting a healthy lifestyle and getting preventive care are important in promoting health and wellness. Ask your health care provider about:  The right schedule for you to have regular tests and exams.  Things you can do on your own to prevent diseases and keep yourself healthy. What should I know about diet, weight, and exercise? Eat a healthy diet   Eat a diet that includes plenty of vegetables, fruits, low-fat dairy products, and lean protein.  Do not eat a lot of foods that are high in solid fats, added sugars, or sodium. Maintain a healthy weight Body mass index (BMI) is used to identify weight problems. It estimates body fat based on height and weight. Your health care provider can help determine your BMI and help you achieve or maintain a healthy weight. Get regular exercise Get regular exercise. This is one of the most important things you can do for your health. Most adults should:  Exercise for at least 150 minutes each week. The exercise should increase your heart rate and make you sweat (moderate-intensity exercise).  Do strengthening exercises at least twice a week. This is in addition to the moderate-intensity exercise.  Spend less time sitting. Even light physical activity can be beneficial. Watch cholesterol and blood lipids Have your blood tested for lipids and cholesterol at 78 years of age, then have this test every 5 years. Have your cholesterol levels checked more often if:  Your lipid or cholesterol levels are high.  You are older than 78 years of age.  You are at high risk for heart disease. What should I know about cancer screening? Depending on your health history and family history, you may need to have cancer screening at various ages. This may include screening for:  Breast cancer.  Cervical cancer.  Colorectal cancer.  Skin cancer.  Lung cancer. What should I know about heart disease, diabetes, and high blood  pressure? Blood pressure and heart disease  High blood pressure causes heart disease and increases the risk of stroke. This is more likely to develop in people who have high blood pressure readings, are of African descent, or are overweight.  Have your blood pressure checked: ? Every 3-5 years if you are 18-39 years of age. ? Every year if you are 40 years old or older. Diabetes Have regular diabetes screenings. This checks your fasting blood sugar level. Have the screening done:  Once every three years after age 40 if you are at a normal weight and have a low risk for diabetes.  More often and at a younger age if you are overweight or have a high risk for diabetes. What should I know about preventing infection? Hepatitis B If you have a higher risk for hepatitis B, you should be screened for this virus. Talk with your health care provider to find out if you are at risk for hepatitis B infection. Hepatitis C Testing is recommended for:  Everyone born from 1945 through 1965.  Anyone with known risk factors for hepatitis C. Sexually transmitted infections (STIs)  Get screened for STIs, including gonorrhea and chlamydia, if: ? You are sexually active and are younger than 78 years of age. ? You are older than 78 years of age and your health care provider tells you that you are at risk for this type of infection. ? Your sexual activity has changed since you were last screened, and you are at increased risk for chlamydia or gonorrhea. Ask your health care provider if   you are at risk.  Ask your health care provider about whether you are at high risk for HIV. Your health care provider may recommend a prescription medicine to help prevent HIV infection. If you choose to take medicine to prevent HIV, you should first get tested for HIV. You should then be tested every 3 months for as long as you are taking the medicine. Pregnancy  If you are about to stop having your period (premenopausal) and  you may become pregnant, seek counseling before you get pregnant.  Take 400 to 800 micrograms (mcg) of folic acid every day if you become pregnant.  Ask for birth control (contraception) if you want to prevent pregnancy. Osteoporosis and menopause Osteoporosis is a disease in which the bones lose minerals and strength with aging. This can result in bone fractures. If you are 65 years old or older, or if you are at risk for osteoporosis and fractures, ask your health care provider if you should:  Be screened for bone loss.  Take a calcium or vitamin D supplement to lower your risk of fractures.  Be given hormone replacement therapy (HRT) to treat symptoms of menopause. Follow these instructions at home: Lifestyle  Do not use any products that contain nicotine or tobacco, such as cigarettes, e-cigarettes, and chewing tobacco. If you need help quitting, ask your health care provider.  Do not use street drugs.  Do not share needles.  Ask your health care provider for help if you need support or information about quitting drugs. Alcohol use  Do not drink alcohol if: ? Your health care provider tells you not to drink. ? You are pregnant, may be pregnant, or are planning to become pregnant.  If you drink alcohol: ? Limit how much you use to 0-1 drink a day. ? Limit intake if you are breastfeeding.  Be aware of how much alcohol is in your drink. In the [place] one drink equals one 12 oz bottle of beer (355 mL), one 5 oz glass of wine (148 mL), or one 1 oz glass of hard liquor (44 mL). General instructions  Schedule regular health, dental, and eye exams.  Stay current with your vaccines.  Tell your health care provider if: ? You often feel depressed. ? You have ever been abused or do not feel safe at home. Summary  Adopting a healthy lifestyle and getting preventive care are important in promoting health and wellness.  Follow your health care provider's instructions about healthy  diet, exercising, and getting tested or screened for diseases.  Follow your health care provider's instructions on monitoring your cholesterol and blood pressure. This information is not intended to replace advice given to you by your health care provider. Make sure you discuss any questions you have with your health care provider. Document Revised: 05/03/2018 Document Reviewed: 05/03/2018 Elsevier Patient Education  2020 Elsevier Inc.  

## 2020-01-16 LAB — HEPATIC FUNCTION PANEL
AG Ratio: 1.7 (calc) (ref 1.0–2.5)
ALT: 10 U/L (ref 6–29)
AST: 15 U/L (ref 10–35)
Albumin: 4 g/dL (ref 3.6–5.1)
Alkaline phosphatase (APISO): 79 U/L (ref 37–153)
Bilirubin, Direct: 0.1 mg/dL (ref 0.0–0.2)
Globulin: 2.3 g/dL (calc) (ref 1.9–3.7)
Indirect Bilirubin: 0.4 mg/dL (calc) (ref 0.2–1.2)
Total Bilirubin: 0.5 mg/dL (ref 0.2–1.2)
Total Protein: 6.3 g/dL (ref 6.1–8.1)

## 2020-01-16 LAB — URINALYSIS, ROUTINE W REFLEX MICROSCOPIC
Bacteria, UA: NONE SEEN /HPF
Bilirubin Urine: NEGATIVE
Glucose, UA: NEGATIVE
Hyaline Cast: NONE SEEN /LPF
Ketones, ur: NEGATIVE
Leukocytes,Ua: NEGATIVE
Nitrite: NEGATIVE
Protein, ur: NEGATIVE
RBC / HPF: NONE SEEN /HPF (ref 0–2)
Specific Gravity, Urine: 1.01 (ref 1.001–1.03)
Squamous Epithelial / HPF: NONE SEEN /HPF (ref <=5)
WBC, UA: NONE SEEN /HPF (ref 0–5)
pH: <=5 (ref 5.0–8.0)

## 2020-01-16 LAB — HEPATITIS C ANTIBODY
Hepatitis C Ab: NONREACTIVE
SIGNAL TO CUT-OFF: 0.01 (ref <1.00)

## 2020-01-16 LAB — LIPID PANEL
Cholesterol: 168 mg/dL (ref <200)
HDL: 62 mg/dL (ref >=50)
LDL Cholesterol (Calc): 84 mg/dL (calc)
Non-HDL Cholesterol (Calc): 106 mg/dL (calc) (ref <130)
Total CHOL/HDL Ratio: 2.7 (calc) (ref <5.0)
Triglycerides: 121 mg/dL (ref <150)

## 2020-01-16 LAB — BASIC METABOLIC PANEL WITH GFR
BUN: 20 mg/dL (ref 7–25)
CO2: 20 mmol/L (ref 20–32)
Calcium: 9.5 mg/dL (ref 8.6–10.4)
Chloride: 108 mmol/L (ref 98–110)
Creat: 0.78 mg/dL (ref 0.60–0.93)
GFR, Est African American: 84 mL/min/{1.73_m2} (ref >=60)
GFR, Est Non African American: 73 mL/min/{1.73_m2} (ref >=60)
Glucose, Bld: 84 mg/dL (ref 65–99)
Potassium: 4.5 mmol/L (ref 3.5–5.3)
Sodium: 142 mmol/L (ref 135–146)

## 2020-01-29 ENCOUNTER — Encounter: Payer: Self-pay | Admitting: Internal Medicine

## 2020-01-31 DIAGNOSIS — Z85828 Personal history of other malignant neoplasm of skin: Secondary | ICD-10-CM | POA: Diagnosis not present

## 2020-01-31 DIAGNOSIS — L814 Other melanin hyperpigmentation: Secondary | ICD-10-CM | POA: Diagnosis not present

## 2020-01-31 DIAGNOSIS — D225 Melanocytic nevi of trunk: Secondary | ICD-10-CM | POA: Diagnosis not present

## 2020-01-31 DIAGNOSIS — L821 Other seborrheic keratosis: Secondary | ICD-10-CM | POA: Diagnosis not present

## 2020-01-31 DIAGNOSIS — D1801 Hemangioma of skin and subcutaneous tissue: Secondary | ICD-10-CM | POA: Diagnosis not present

## 2020-02-07 ENCOUNTER — Ambulatory Visit: Payer: Medicare Other | Attending: Internal Medicine

## 2020-02-07 DIAGNOSIS — Z23 Encounter for immunization: Secondary | ICD-10-CM

## 2020-02-07 NOTE — Progress Notes (Signed)
   Covid-19 Vaccination [facility]  Name:  Jessica Woodward    MRN: [id number] DOB: [birth year]  02/07/2020  [name] was observed post Covid-19 immunization for 15 minutes without incident. She was provided with Vaccine Information Sheet and instruction to access the V-Safe system.   [name] was instructed to call 911 with any severe reactions post vaccine: Marland Kitchen Difficulty breathing  . Swelling of face and throat  . A fast heartbeat  . A bad rash all over body  . Dizziness and weakness

## 2020-02-12 DIAGNOSIS — H04123 Dry eye syndrome of bilateral lacrimal glands: Secondary | ICD-10-CM | POA: Diagnosis not present

## 2020-02-12 DIAGNOSIS — H2513 Age-related nuclear cataract, bilateral: Secondary | ICD-10-CM | POA: Diagnosis not present

## 2020-02-12 DIAGNOSIS — H43811 Vitreous degeneration, right eye: Secondary | ICD-10-CM | POA: Diagnosis not present

## 2020-02-28 ENCOUNTER — Encounter: Payer: Self-pay | Admitting: Internal Medicine

## 2020-03-06 DIAGNOSIS — Z23 Encounter for immunization: Secondary | ICD-10-CM | POA: Diagnosis not present

## 2020-03-13 ENCOUNTER — Ambulatory Visit (INDEPENDENT_AMBULATORY_CARE_PROVIDER_SITE_OTHER): Payer: Medicare Other

## 2020-03-13 DIAGNOSIS — I442 Atrioventricular block, complete: Secondary | ICD-10-CM

## 2020-03-13 LAB — CUP PACEART REMOTE DEVICE CHECK
Battery Remaining Longevity: 108 mo
Battery Voltage: 3.18 V
Brady Statistic AP VP Percent: 1.48 %
Brady Statistic AP VS Percent: 0.01 %
Brady Statistic AS VP Percent: 98.29 %
Brady Statistic AS VS Percent: 0.22 %
Brady Statistic RA Percent Paced: 1.48 %
Brady Statistic RV Percent Paced: 99.77 %
Date Time Interrogation Session: 20211021021421
Implantable Lead Implant Date: 20210720
Implantable Lead Implant Date: 20210720
Implantable Lead Location: 753859
Implantable Lead Location: 753860
Implantable Lead Model: 3830
Implantable Lead Model: 5076
Implantable Pulse Generator Implant Date: 20210720
Lead Channel Impedance Value: 247 Ohm
Lead Channel Impedance Value: 361 Ohm
Lead Channel Impedance Value: 437 Ohm
Lead Channel Impedance Value: 513 Ohm
Lead Channel Pacing Threshold Amplitude: 0.5 V
Lead Channel Pacing Threshold Amplitude: 0.625 V
Lead Channel Pacing Threshold Pulse Width: 0.4 ms
Lead Channel Pacing Threshold Pulse Width: 0.4 ms
Lead Channel Sensing Intrinsic Amplitude: 1.625 mV
Lead Channel Sensing Intrinsic Amplitude: 1.625 mV
Lead Channel Sensing Intrinsic Amplitude: 6.375 mV
Lead Channel Sensing Intrinsic Amplitude: 6.375 mV
Lead Channel Setting Pacing Amplitude: 3.25 V
Lead Channel Setting Pacing Amplitude: 3.5 V
Lead Channel Setting Pacing Pulse Width: 0.4 ms
Lead Channel Setting Sensing Sensitivity: 2 mV

## 2020-03-14 ENCOUNTER — Encounter: Payer: Medicare Other | Admitting: Internal Medicine

## 2020-03-18 NOTE — Progress Notes (Signed)
Remote pacemaker transmission.   

## 2020-03-26 ENCOUNTER — Encounter: Payer: Medicare Other | Admitting: Internal Medicine

## 2020-03-27 ENCOUNTER — Other Ambulatory Visit: Payer: Self-pay

## 2020-03-27 ENCOUNTER — Encounter: Payer: Self-pay | Admitting: Internal Medicine

## 2020-03-27 ENCOUNTER — Ambulatory Visit (INDEPENDENT_AMBULATORY_CARE_PROVIDER_SITE_OTHER): Payer: Medicare Other | Admitting: Internal Medicine

## 2020-03-27 VITALS — BP 124/76 | HR 83 | Ht 64.0 in | Wt 149.0 lb

## 2020-03-27 DIAGNOSIS — R0602 Shortness of breath: Secondary | ICD-10-CM

## 2020-03-27 NOTE — Progress Notes (Signed)
HPI Jessica Woodward returns today for followup. She is a pleasant 79 yo woman with CHB, s/p PPM insertion. She has developed problems with sob and non-exertional right side chest pain. She has not had syncope. No edema. She has trouble walking, especially up an incline. No swelling. Allergies  Allergen Reactions  . Anastrozole Other (See Comments) and Rash    Causes muscle weakness  . Lisinopril     cough  . Sulfasalazine Hives  . Sulfonamide Derivatives Hives  . Lactose Intolerance (Gi) Diarrhea     Current Outpatient Medications  Medication Sig Dispense Refill  . acetaminophen (TYLENOL) 325 MG tablet Take 1-2 tablets (325-650 mg total) by mouth every 4 (four) hours as needed for mild pain.    Marland Kitchen atorvastatin (LIPITOR) 40 MG tablet Take 1 tablet (40 mg total) by mouth daily. 90 tablet 1  . cyclobenzaprine (FLEXERIL) 5 MG tablet Take 1 tablet (5 mg total) by mouth 3 (three) times daily as needed for muscle spasms. 90 tablet 2  . irbesartan (AVAPRO) 150 MG tablet Take 1 tablet (150 mg total) by mouth daily. 90 tablet 1  . loratadine (CLARITIN) 10 MG tablet Take 10 mg by mouth daily.    . meloxicam (MOBIC) 7.5 MG tablet Take 1 tablet (7.5 mg total) by mouth daily. 90 tablet 1  . Multiple Vitamin (MULTIVITAMIN) tablet Take 1 tablet by mouth daily.    . raloxifene (EVISTA) 60 MG tablet Take 1 tablet (60 mg total) by mouth daily. 90 tablet 3  . triamcinolone cream (KENALOG) 0.1 % Apply 1 application topically 4 (four) times daily as needed (rash).      No current facility-administered medications for this visit.     Past Medical History:  Diagnosis Date  . Arthritis   . Breast disorder    cancer  . CARCINOMA, SQUAMOUS CELL, HX OF 06/05/2007  . DUCTAL CARCINOMA IN SITU, LEFT BREAST 2002   s/p left lumpectomy, xrt and tamoxifen  . History of chemotherapy   . Hyperlipemia   . Hypertension   . Personal history of chemotherapy 2002  . Personal history of radiation therapy 2002  .  RADIATION THERAPY, HX OF   . Seasonal allergies   . Skin cancer     ROS:   All systems reviewed and negative except as noted in the HPI.   Past Surgical History:  Procedure Laterality Date  . ABDOMINAL HYSTERECTOMY    . athroscopy right knee  2013   torn miniscus  . BREAST BIOPSY Left 2002  . BREAST EXCISIONAL BIOPSY Right   . BREAST LUMPECTOMY Left 2002   had chemo and radiation  . BREAST SURGERY     Left breast lumpectomy with axillary sential node  . DILATION AND CURETTAGE OF UTERUS    . Left knee surgery    . MOHS SURGERY     (R) arm  . PACEMAKER IMPLANT N/A 12/11/2019   Procedure: PACEMAKER IMPLANT;  Surgeon: Evans Lance, MD;  Location: [facility];  Service: Cardiovascular;  Laterality: N/A;  . right breast lupectomy    . TONSILLECTOMY AND ADENOIDECTOMY    . TUBAL LIGATION       Family History  Problem Relation Age of Onset  . Coronary artery disease Mother   . Dementia Mother   . Macular degeneration Father   . Coronary artery disease Father   . Diabetes Father   . Cancer Brother        pancreatic  .  Heart disease Maternal Grandmother   . Kidney disease Maternal Grandmother   . Heart disease Maternal Grandfather   . Heart disease Paternal Grandmother   . Diabetes Paternal Grandmother   . Heart disease Paternal Grandfather   . Hypertension Son   . Heart attack Son   . Hypertension Son      Social History   Socioeconomic History  . Marital status: Widowed    Spouse name: Not on file  . Number of children: 2  . Years of education: Not on file  . Highest education level: Not on file  Occupational History  . Occupation: retired  Tobacco Use  . Smoking status: Former Smoker    Types: Cigarettes    Quit date: 12/28/1963    Years since quitting: 56.2  . Smokeless tobacco: Never Used  Vaping Use  . Vaping Use: Never used  Substance and Sexual Activity  . Alcohol use: Not Currently    Comment: rare glass of wine  . Drug use: No  .  Sexual activity: Not Currently    Partners: Male    Birth control/protection: Surgical    Comment: hyst  Other Topics Concern  . Not on file  Social History Narrative   HSG, [employer] college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 married '60. 2 boys - '62, '73; 11 grandchildren; 3 great-grandchildren. Retired.   Social Determinants of Health   Financial Resource Strain:   . Difficulty of Paying Living Expenses: Not on file  Food Insecurity:   . Worried About Charity fundraiser in the Last Year: Not on file  . Ran Out of Food in the Last Year: Not on file  Transportation Needs:   . Lack of Transportation (Medical): Not on file  . Lack of Transportation (Non-Medical): Not on file  Physical Activity:   . Days of Exercise per Week: Not on file  . Minutes of Exercise per Session: Not on file  Stress:   . Feeling of Stress : Not on file  Social Connections:   . Frequency of Communication with Friends and Family: Not on file  . Frequency of Social Gatherings with Friends and Family: Not on file  . Attends Religious Services: Not on file  . Active Member of Clubs or Organizations: Not on file  . Attends Archivist Meetings: Not on file  . Marital Status: Not on file  Intimate Partner Violence:   . Fear of Current or Ex-Partner: Not on file  . Emotionally Abused: Not on file  . Physically Abused: Not on file  . Sexually Abused: Not on file     BP 124/76   Pulse 83   Ht 5\' 4"  (1.626 m)   Wt 149 lb (67.6 kg)   SpO2 98%   BMI 25.58 kg/m   Physical Exam:  Well appearing 79 yo woman, NAD HEENT: Unremarkable Neck:  6 cm JVD, no thyromegally Lymphatics:  No adenopathy Back:  No CVA tenderness Lungs:  Clear with minimal basilar rales HEART:  Regular rate rhythm, no murmurs, no rubs, no clicks Abd:  soft, positive bowel sounds, no organomegally, no rebound, no guarding Ext:  2 plus pulses, no edema, no cyanosis, no clubbing Skin:  No rashes no nodules Neuro:  CN II through  XII intact, motor grossly intact   DEVICE  Normal device function.  See PaceArt for details.   Assess/Plan: 1. CHB - she is s/p PPM insertion.  2. PPM - her medtronic DDD PM is working normally.  3.  Dyspnea - the etiology is unclear. She will undergo 2D echo. 4. Non-exertional chest pressure - This is non-anginaly. Based on the results of the echo, additional eval to follow.  Jessica Overlie Abra Lingenfelter,MD

## 2020-03-27 NOTE — Patient Instructions (Signed)
Medication Instructions:  Your physician recommends that you continue on your current medications as directed. Please refer to the Current Medication list given to you today.  *If you need a refill on your cardiac medications before your next appointment, please call your pharmacy*   Lab Work: NONE   If you have labs (blood work) drawn today and your tests are completely normal, you will receive your results only by: . MyChart Message (if you have MyChart) OR . A paper copy in the mail If you have any lab test that is abnormal or we need to change your treatment, we will call you to review the results.   Testing/Procedures: Your physician has requested that you have an echocardiogram. Echocardiography is a painless test that uses sound waves to create images of your heart. It provides your doctor with information about the size and shape of your heart and how well your heart's chambers and valves are working. This procedure takes approximately one hour. There are no restrictions for this procedure.   Follow-Up: At [facility], you and your health needs are our priority.  As part of our continuing mission to provide you with exceptional heart care, we have created designated Provider Care Teams.  These Care Teams include your primary Cardiologist (physician) and Advanced Practice Providers (APPs -  Physician Assistants and Nurse Practitioners) who all work together to provide you with the care you need, when you need it.  We recommend signing up for the patient portal called "MyChart".  Sign up information is provided on this After Visit Summary.  MyChart is used to connect with patients for Virtual Visits (Telemedicine).  Patients are able to view lab/test results, encounter notes, upcoming appointments, etc.  Non-urgent messages can be sent to your provider as well.   To learn more about what you can do with MyChart, go to [email or web address].    Your next appointment:   1  year(s)  The format for your next appointment:   In Person  Provider:   Gregg Taylor, MD   Other Instructions Thank you for choosing [facility]!    

## 2020-03-31 ENCOUNTER — Encounter: Payer: Self-pay | Admitting: Internal Medicine

## 2020-04-02 ENCOUNTER — Other Ambulatory Visit: Payer: Self-pay

## 2020-04-02 ENCOUNTER — Ambulatory Visit (HOSPITAL_COMMUNITY)
Admission: RE | Admit: 2020-04-02 | Discharge: 2020-04-02 | Disposition: A | Discharge status: Home / Self Care | Payer: Medicare Other | Source: Ambulatory Visit | Attending: Internal Medicine | Admitting: Internal Medicine

## 2020-04-02 DIAGNOSIS — R0602 Shortness of breath: Secondary | ICD-10-CM | POA: Diagnosis not present

## 2020-04-02 LAB — ECHOCARDIOGRAM COMPLETE
AR max vel: 3.12 cm2
AV Area VTI: 2.82 cm2
AV Area mean vel: 2.97 cm2
AV Mean grad: 2.7 mmHg
AV Peak grad: 4.9 mmHg
Ao pk vel: 1.11 m/s
Area-P 1/2: 3.03 cm2
S' Lateral: 2.37 cm

## 2020-04-02 NOTE — Progress Notes (Signed)
*  PRELIMINARY RESULTS* Echocardiogram 2D Echocardiogram has been performed.  Jessica Woodward 04/02/2020, 11:19 AM

## 2020-04-09 ENCOUNTER — Telehealth: Payer: Self-pay | Admitting: *Deleted

## 2020-04-09 DIAGNOSIS — R0602 Shortness of breath: Secondary | ICD-10-CM

## 2020-04-09 NOTE — Telephone Encounter (Signed)
Order placed

## 2020-04-09 NOTE — Telephone Encounter (Signed)
-----   Message from Evans Lance, MD sent at 04/08/2020  8:53 AM EST ----- Alda Berthold, she will need a CT scan of the chest with contrast. "evaluate pericardial effusion. Look for evidence of chronic lead perforation."

## 2020-04-10 ENCOUNTER — Telehealth: Payer: Self-pay | Admitting: Internal Medicine

## 2020-04-10 DIAGNOSIS — Z01818 Encounter for other preprocedural examination: Secondary | ICD-10-CM

## 2020-04-10 NOTE — Telephone Encounter (Signed)
New message    Patient states that she has been told what her echo results were, why does she need the CT? She has some questions please call

## 2020-04-10 NOTE — Addendum Note (Signed)
Addended by: Barbarann Ehlers A on: 04/10/2020 03:27 PM   Modules accepted: Orders

## 2020-04-10 NOTE — Telephone Encounter (Signed)
Note ----- Message from Evans Lance, MD sent at 04/08/2020  8:53 AM EST ----- Alda Berthold, she will need a CT scan of the chest with contrast. "evaluate pericardial effusion. Look for evidence of chronic lead perforation."      Patient notified and aits CT.States she feels a little beter knowing this is not an emergency.

## 2020-04-24 ENCOUNTER — Emergency Department (HOSPITAL_COMMUNITY): Payer: Medicare Other

## 2020-04-24 ENCOUNTER — Other Ambulatory Visit: Payer: Self-pay

## 2020-04-24 ENCOUNTER — Encounter (HOSPITAL_COMMUNITY): Payer: Self-pay | Admitting: Emergency Medicine

## 2020-04-24 ENCOUNTER — Emergency Department (HOSPITAL_COMMUNITY)
Admission: EM | Admit: 2020-04-24 | Discharge: 2020-04-24 | Disposition: A | Discharge status: Home / Self Care | Payer: Medicare Other | Attending: Emergency Medicine | Admitting: Emergency Medicine

## 2020-04-24 DIAGNOSIS — Z79899 Other long term (current) drug therapy: Secondary | ICD-10-CM | POA: Diagnosis not present

## 2020-04-24 DIAGNOSIS — R202 Paresthesia of skin: Secondary | ICD-10-CM | POA: Diagnosis not present

## 2020-04-24 DIAGNOSIS — Z950 Presence of cardiac pacemaker: Secondary | ICD-10-CM | POA: Insufficient documentation

## 2020-04-24 DIAGNOSIS — R200 Anesthesia of skin: Secondary | ICD-10-CM | POA: Diagnosis not present

## 2020-04-24 DIAGNOSIS — Z743 Need for continuous supervision: Secondary | ICD-10-CM | POA: Diagnosis not present

## 2020-04-24 DIAGNOSIS — Z853 Personal history of malignant neoplasm of breast: Secondary | ICD-10-CM | POA: Diagnosis not present

## 2020-04-24 DIAGNOSIS — I10 Essential (primary) hypertension: Secondary | ICD-10-CM | POA: Diagnosis not present

## 2020-04-24 DIAGNOSIS — Z85828 Personal history of other malignant neoplasm of skin: Secondary | ICD-10-CM | POA: Insufficient documentation

## 2020-04-24 DIAGNOSIS — Z87891 Personal history of nicotine dependence: Secondary | ICD-10-CM | POA: Diagnosis not present

## 2020-04-24 DIAGNOSIS — N183 Chronic kidney disease, stage 3 unspecified: Secondary | ICD-10-CM | POA: Diagnosis not present

## 2020-04-24 DIAGNOSIS — I129 Hypertensive chronic kidney disease with stage 1 through stage 4 chronic kidney disease, or unspecified chronic kidney disease: Secondary | ICD-10-CM | POA: Diagnosis not present

## 2020-04-24 DIAGNOSIS — R6889 Other general symptoms and signs: Secondary | ICD-10-CM | POA: Diagnosis not present

## 2020-04-24 LAB — CBC WITH DIFFERENTIAL/PLATELET
Abs Immature Granulocytes: 0.01 10*3/uL (ref 0.00–0.07)
Basophils Absolute: 0 10*3/uL (ref 0.0–0.1)
Basophils Relative: 1 %
Eosinophils Absolute: 0.2 10*3/uL (ref 0.0–0.5)
Eosinophils Relative: 3 %
HCT: 38.2 % (ref 36.0–46.0)
Hemoglobin: 12.4 g/dL (ref 12.0–15.0)
Immature Granulocytes: 0 %
Lymphocytes Relative: 32 %
Lymphs Abs: 1.9 10*3/uL (ref 0.7–4.0)
MCH: 30.7 pg (ref 26.0–34.0)
MCHC: 32.5 g/dL (ref 30.0–36.0)
MCV: 94.6 fL (ref 80.0–100.0)
Monocytes Absolute: 0.6 10*3/uL (ref 0.1–1.0)
Monocytes Relative: 10 %
Neutro Abs: 3.3 10*3/uL (ref 1.7–7.7)
Neutrophils Relative %: 54 %
Platelets: 229 10*3/uL (ref 150–400)
RBC: 4.04 MIL/uL (ref 3.87–5.11)
RDW: 13.2 % (ref 11.5–15.5)
WBC: 6 10*3/uL (ref 4.0–10.5)
nRBC: 0 % (ref 0.0–0.2)

## 2020-04-24 LAB — COMPREHENSIVE METABOLIC PANEL
ALT: 18 U/L (ref 0–44)
AST: 22 U/L (ref 15–41)
Albumin: 3.7 g/dL (ref 3.5–5.0)
Alkaline Phosphatase: 59 U/L (ref 38–126)
Anion gap: 8 (ref 5–15)
BUN: 22 mg/dL (ref 8–23)
CO2: 20 mmol/L — ABNORMAL LOW (ref 22–32)
Calcium: 9 mg/dL (ref 8.9–10.3)
Chloride: 110 mmol/L (ref 98–111)
Creatinine, Ser: 0.78 mg/dL (ref 0.44–1.00)
GFR, Estimated: >60 mL/min (ref >60)
Glucose, Bld: 97 mg/dL (ref 70–99)
Potassium: 3.6 mmol/L (ref 3.5–5.1)
Sodium: 138 mmol/L (ref 135–145)
Total Bilirubin: 0.6 mg/dL (ref 0.3–1.2)
Total Protein: 6.1 g/dL — ABNORMAL LOW (ref 6.5–8.1)

## 2020-04-24 LAB — TROPONIN I (HIGH SENSITIVITY): Troponin I (High Sensitivity): 6 ng/L (ref <18)

## 2020-04-24 NOTE — ED Provider Notes (Signed)
[facility] EMERGENCY DEPARTMENT Provider Note   CSN: [id number] Arrival date & time: 04/24/20  2992   History Chief Complaint  Patient presents with  . Numbness    Jessica Woodward is a 78 y.o. female.  The history is provided by the patient.  She has history of hypertension, hyperlipidemia, pacemaker and comes in because of numbness in her left arm and feeling anxious.  She was fine when she went to sleep at 9 PM, woke up with numbness in her left arm and feeling anxious.  She checked her blood pressure and it was high.  She thinks her systolic was 426.  She denies any headache, and denies any weakness.  She denies any dizziness.  Anxiety and numbness have persisted.  She denies any numbness in her face or leg.  Past Medical History:  Diagnosis Date  . Arthritis   . Breast disorder    cancer  . CARCINOMA, SQUAMOUS CELL, HX OF 06/05/2007  . DUCTAL CARCINOMA IN SITU, LEFT BREAST 2002   s/p left lumpectomy, xrt and tamoxifen  . History of chemotherapy   . Hyperlipemia   . Hypertension   . Personal history of chemotherapy 2002  . Personal history of radiation therapy 2002  . RADIATION THERAPY, HX OF   . Seasonal allergies   . Skin cancer     Patient Active Problem List   Diagnosis Date Noted  . Heart block 12/11/2019  . DDD (degenerative disc disease), cervical 05/10/2019  . Chronic renal disease, stage 3, moderately decreased glomerular filtration rate (GFR) between 30-59 mL/min/1.73 square meter ([place]) 12/12/2018  . Seasonal allergic rhinitis due to pollen 02/15/2017  . Bilateral sensorineural hearing loss 12/27/2016  . Essential hypertension, benign 09/11/2013  . Routine health maintenance 01/18/2012  . Dyslipidemia, goal LDL below 130 01/17/2012  . DUCTAL CARCINOMA IN SITU, LEFT BREAST 06/05/2007    Past Surgical History:  Procedure Laterality Date  . ABDOMINAL HYSTERECTOMY    . athroscopy right knee  2013   torn miniscus  . BREAST BIOPSY Left 2002  . BREAST  EXCISIONAL BIOPSY Right   . BREAST LUMPECTOMY Left 2002   had chemo and radiation  . BREAST SURGERY     Left breast lumpectomy with axillary sential node  . DILATION AND CURETTAGE OF UTERUS    . Left knee surgery    . MOHS SURGERY     (R) arm  . PACEMAKER IMPLANT N/A 12/11/2019   Procedure: PACEMAKER IMPLANT;  Surgeon: Evans Lance, MD;  Location: [place] [facility];  Service: Cardiovascular;  Laterality: N/A;  . right breast lupectomy    . TONSILLECTOMY AND ADENOIDECTOMY    . TUBAL LIGATION       OB History    Gravida  3   Para  2   Term      Preterm      AB  1   Living  2     SAB  1   TAB      Ectopic      Multiple      Live Births  2           Family History  Problem Relation Age of Onset  . Coronary artery disease Mother   . Dementia Mother   . Macular degeneration Father   . Coronary artery disease Father   . Diabetes Father   . Cancer Brother        pancreatic  . Heart disease Maternal Grandmother   .  Kidney disease Maternal Grandmother   . Heart disease Maternal Grandfather   . Heart disease Paternal Grandmother   . Diabetes Paternal Grandmother   . Heart disease Paternal Grandfather   . Hypertension Son   . Heart attack Son   . Hypertension Son     Social History   Tobacco Use  . Smoking status: Former Smoker    Types: Cigarettes    Quit date: 12/28/1963    Years since quitting: 56.3  . Smokeless tobacco: Never Used  Vaping Use  . Vaping Use: Never used  Substance Use Topics  . Alcohol use: Not Currently    Comment: rare glass of wine  . Drug use: No    Home Medications Prior to Admission medications   Medication Sig Start Date End Date Taking? Authorizing Provider  acetaminophen (TYLENOL) 325 MG tablet Take 1-2 tablets (325-650 mg total) by mouth every 4 (four) hours as needed for mild pain. 12/12/19   Shirley Friar, PA-C  atorvastatin (LIPITOR) 40 MG tablet Take 1 tablet (40 mg total) by mouth daily. 01/15/20    Janith Lima, MD  cyclobenzaprine (FLEXERIL) 5 MG tablet Take 1 tablet (5 mg total) by mouth 3 (three) times daily as needed for muscle spasms. 11/06/19   Marrian Salvage, FNP  irbesartan (AVAPRO) 150 MG tablet Take 1 tablet (150 mg total) by mouth daily. 01/15/20   Janith Lima, MD  loratadine (CLARITIN) 10 MG tablet Take 10 mg by mouth daily.    [provider]  meloxicam (MOBIC) 7.5 MG tablet Take 1 tablet (7.5 mg total) by mouth daily. 01/15/20   Janith Lima, MD  Multiple Vitamin (MULTIVITAMIN) tablet Take 1 tablet by mouth daily.    [provider]  raloxifene (EVISTA) 60 MG tablet Take 1 tablet (60 mg total) by mouth daily. 01/15/20   Janith Lima, MD  triamcinolone cream (KENALOG) 0.1 % Apply 1 application topically 4 (four) times daily as needed (rash).  09/09/16   [provider]  meloxicam (MOBIC) 7.5 MG tablet Take 1 tablet (7.5 mg total) by mouth daily. 06/11/19   Janith Lima, MD    Allergies    Anastrozole, Lisinopril, Sulfasalazine, Sulfonamide derivatives, and Lactose intolerance (gi)  Review of Systems   Review of Systems  All other systems reviewed and are negative.   Physical Exam Updated Vital Signs BP (!) 156/85   Pulse 71   Temp 98.4 F (36.9 C)   Resp 18   Ht 5\' 4"  (1.626 m)   Wt 67.6 kg   SpO2 99%   BMI 25.58 kg/m   Physical Exam Vitals and nursing note reviewed.   79 year old female, resting comfortably and in no acute distress. Vital signs are significant for elevated systolic blood pressure. Oxygen saturation is 99%, which is normal. Head is normocephalic and atraumatic. PERRLA, EOMI. Oropharynx is clear. Neck is nontender and supple without adenopathy or JVD.  There are no carotid bruits. Back is nontender and there is no CVA tenderness. Lungs are clear without rales, wheezes, or rhonchi. Chest is nontender. Heart has regular rate and rhythm without murmur. Abdomen is soft, flat, nontender without masses  or hepatosplenomegaly and peristalsis is normoactive. Extremities have no cyanosis or edema, full range of motion is present. Skin is warm and dry without rash. Neurologic: Mental status is normal, cranial nerves are intact.  Motor strength is 5/5 in all extremities.  There is no pronator drift.  Sensation is intact in  all 4 extremities and all 3 divisions of cranial nerve V.  There is no extinction on double simultaneous stimulation.  Finger-nose testing is normal.   ED Results / Procedures / Treatments   Labs (all labs ordered are listed, but only abnormal results are displayed) Labs Reviewed  COMPREHENSIVE METABOLIC PANEL - Abnormal; Notable for the following components:      Result Value   CO2 20 (*)    Total Protein 6.1 (*)    All other components within normal limits  CBC WITH DIFFERENTIAL/PLATELET  TROPONIN I (HIGH SENSITIVITY)    EKG EKG Interpretation  Date/Time:  Thursday April 24 2020 02:09:52 EST Ventricular Rate:  65 PR Interval:    QRS Duration: 133 QT Interval:  445 QTC Calculation: 463 R Axis:   -65 Text Interpretation: Atrial-sensed ventricular-paced rhythm When compared with ECG of 12/14/2019, Atrial-sensed ventricular-paced rhythm is now present Confirmed by Delora Fuel ([id number]) on 04/24/2020 2:17:57 AM   Radiology CT Head Wo Contrast  Result Date: 04/24/2020 CLINICAL DATA:  Left arm tingling EXAM: CT HEAD WITHOUT CONTRAST TECHNIQUE: Contiguous axial images were obtained from the base of the skull through the vertex without intravenous contrast. COMPARISON:  None. FINDINGS: Brain: No evidence of acute infarction, hemorrhage, hydrocephalus, extra-axial collection or mass lesion/mass effect. Vascular: No hyperdense vessel or unexpected calcification. Skull: Normal. Negative for fracture or focal lesion. Sinuses/Orbits: No acute finding. Other: None. IMPRESSION: No acute intracranial abnormality noted. Electronically Signed   By: Inez Catalina M.D.   On: 04/24/2020  02:38   DG Chest Port 1 View  Result Date: 04/24/2020 CLINICAL DATA:  Left arm numbness and hypertension EXAM: PORTABLE CHEST 1 VIEW COMPARISON:  12/14/2019 FINDINGS: Cardiac shadow is within normal limits. Pacing device is again noted and stable. The lungs are clear bilaterally. Postsurgical changes in the left breast are noted. No bony abnormality is seen. IMPRESSION: No acute abnormality noted. Electronically Signed   By: Inez Catalina M.D.   On: 04/24/2020 02:26    Procedures Procedures   Medications Ordered in ED Medications - No data to display  ED Course  I have reviewed the triage vital signs and the nursing notes.  Pertinent labs & imaging results that were available during my care of the patient were reviewed by me and considered in my medical decision making (see chart for details).  MDM Rules/Calculators/A&P Anxiety.  Paresthesias of the left arm.  Elevated blood pressure in patient with known history of hypertension.  ECG is obtained and shows atrial sensing ventricular pacing.  Old records are reviewed, and she has no relevant past visits.  I do not see findings consistent with stroke, so code stroke is not activated.  She will be sent for CT of head and will check screening labs.  CT scan shows no acute process.  Labs are reassuring.  Blood pressure has come back down to normal.  She still has paresthesias, but they are not worsening.  She is felt to be safe for discharge with close follow-up with PCP.  Return precautions discussed.  Final Clinical Impression(s) / ED Diagnoses Final diagnoses:  Paresthesia    Rx / DC Orders ED Discharge Orders    None       Delora Fuel, MD 91/47/82 [phone]

## 2020-04-24 NOTE — ED Triage Notes (Signed)
[facility] - pt c/o left hand tingling that started x 1 hour ago.

## 2020-04-24 NOTE — Discharge Instructions (Addendum)
Your evaluation did not show a reason for your arm to be numb, but there is no sign of a stroke or any vascular problem in your arm.  Return to the emergency department if symptoms are getting worse.

## 2020-04-30 ENCOUNTER — Other Ambulatory Visit (HOSPITAL_COMMUNITY)
Admission: RE | Admit: 2020-04-30 | Discharge: 2020-04-30 | Disposition: A | Discharge status: Home / Self Care | Payer: Medicare Other | Source: Ambulatory Visit | Attending: Internal Medicine | Admitting: Internal Medicine

## 2020-04-30 ENCOUNTER — Other Ambulatory Visit: Payer: Self-pay

## 2020-04-30 DIAGNOSIS — Z01818 Encounter for other preprocedural examination: Secondary | ICD-10-CM | POA: Diagnosis not present

## 2020-04-30 LAB — BASIC METABOLIC PANEL
Anion gap: 7 (ref 5–15)
BUN: 18 mg/dL (ref 8–23)
CO2: 23 mmol/L (ref 22–32)
Calcium: 9.4 mg/dL (ref 8.9–10.3)
Chloride: 105 mmol/L (ref 98–111)
Creatinine, Ser: 0.88 mg/dL (ref 0.44–1.00)
GFR, Estimated: >60 mL/min (ref >60)
Glucose, Bld: 133 mg/dL — ABNORMAL HIGH (ref 70–99)
Potassium: 4.1 mmol/L (ref 3.5–5.1)
Sodium: 135 mmol/L (ref 135–145)

## 2020-05-07 ENCOUNTER — Telehealth: Payer: Self-pay | Admitting: Internal Medicine

## 2020-05-07 ENCOUNTER — Other Ambulatory Visit: Payer: Self-pay

## 2020-05-07 ENCOUNTER — Ambulatory Visit (HOSPITAL_COMMUNITY)
Admission: RE | Admit: 2020-05-07 | Discharge: 2020-05-07 | Disposition: A | Discharge status: Home / Self Care | Payer: Medicare Other | Source: Ambulatory Visit | Attending: Internal Medicine | Admitting: Internal Medicine

## 2020-05-07 DIAGNOSIS — R0602 Shortness of breath: Secondary | ICD-10-CM | POA: Diagnosis not present

## 2020-05-07 DIAGNOSIS — J984 Other disorders of lung: Secondary | ICD-10-CM | POA: Diagnosis not present

## 2020-05-07 DIAGNOSIS — K449 Diaphragmatic hernia without obstruction or gangrene: Secondary | ICD-10-CM | POA: Diagnosis not present

## 2020-05-07 DIAGNOSIS — I700 Atherosclerosis of aorta: Secondary | ICD-10-CM | POA: Diagnosis not present

## 2020-05-07 DIAGNOSIS — I313 Pericardial effusion (noninflammatory): Secondary | ICD-10-CM | POA: Diagnosis not present

## 2020-05-07 MED ORDER — IOHEXOL 300 MG/ML  SOLN
75.0000 mL | Freq: Once | INTRAMUSCULAR | Status: AC | PRN
  Administered 2020-05-07: 75 mL via INTRAVENOUS

## 2020-05-07 NOTE — Telephone Encounter (Signed)
Sent a staff message to Dr. Lovena Le to result Chest CT so we can call patient.

## 2020-05-07 NOTE — Telephone Encounter (Signed)
Jessica Woodward is calling stating she got her CT results back and some of the results are scaring her. She is requesting a nurse call her back to discuss. Please advise.

## 2020-05-08 NOTE — Telephone Encounter (Signed)
I spoke with patient.She had read initial CT report that said malignancy suspected. She did read impression that no evidence of malignancy in cheat which reassured her.   She still questions why she is short of breath.

## 2020-05-21 ENCOUNTER — Telehealth: Payer: Self-pay

## 2020-05-21 DIAGNOSIS — R0602 Shortness of breath: Secondary | ICD-10-CM

## 2020-05-21 NOTE — Telephone Encounter (Signed)
Patient contacted and verbalized understanding about having to have a Echocardiogram repeated in March.

## 2020-05-21 NOTE — Telephone Encounter (Signed)
-----   Message from Wiliam Ke, RN sent at 05/21/2020 11:52 AM EST ----- Needs echo in March-RDS patient

## 2020-05-21 NOTE — Telephone Encounter (Signed)
-----   Message from Jennifer A Smith, RN sent at 05/21/2020 11:52 AM EST ----- °Needs echo in March-RDS patient °

## 2020-06-06 DIAGNOSIS — Z85828 Personal history of other malignant neoplasm of skin: Secondary | ICD-10-CM | POA: Diagnosis not present

## 2020-06-06 DIAGNOSIS — L820 Inflamed seborrheic keratosis: Secondary | ICD-10-CM | POA: Diagnosis not present

## 2020-06-06 DIAGNOSIS — L298 Other pruritus: Secondary | ICD-10-CM | POA: Diagnosis not present

## 2020-06-07 ENCOUNTER — Other Ambulatory Visit: Payer: Self-pay | Admitting: Internal Medicine

## 2020-06-07 DIAGNOSIS — M5030 Other cervical disc degeneration, unspecified cervical region: Secondary | ICD-10-CM

## 2020-06-09 ENCOUNTER — Other Ambulatory Visit: Payer: Self-pay | Admitting: Internal Medicine

## 2020-06-09 DIAGNOSIS — I10 Essential (primary) hypertension: Secondary | ICD-10-CM

## 2020-06-12 ENCOUNTER — Ambulatory Visit (INDEPENDENT_AMBULATORY_CARE_PROVIDER_SITE_OTHER): Payer: Medicare Other

## 2020-06-12 DIAGNOSIS — I442 Atrioventricular block, complete: Secondary | ICD-10-CM

## 2020-06-13 LAB — CUP PACEART REMOTE DEVICE CHECK
Battery Remaining Longevity: 134 mo
Battery Voltage: 3.15 V
Brady Statistic AP VP Percent: 2.14 %
Brady Statistic AP VS Percent: 0 %
Brady Statistic AS VP Percent: 97.85 %
Brady Statistic AS VS Percent: 0.01 %
Brady Statistic RA Percent Paced: 2.12 %
Brady Statistic RV Percent Paced: 99.99 %
Date Time Interrogation Session: 20220120010541
Implantable Lead Implant Date: 20210720
Implantable Lead Implant Date: 20210720
Implantable Lead Location: 753859
Implantable Lead Location: 753860
Implantable Lead Model: 3830
Implantable Lead Model: 5076
Implantable Pulse Generator Implant Date: 20210720
Lead Channel Impedance Value: 228 Ohm
Lead Channel Impedance Value: 342 Ohm
Lead Channel Impedance Value: 418 Ohm
Lead Channel Impedance Value: 513 Ohm
Lead Channel Pacing Threshold Amplitude: 0.625 V
Lead Channel Pacing Threshold Amplitude: 0.75 V
Lead Channel Pacing Threshold Pulse Width: 0.4 ms
Lead Channel Pacing Threshold Pulse Width: 0.4 ms
Lead Channel Sensing Intrinsic Amplitude: 1.625 mV
Lead Channel Sensing Intrinsic Amplitude: 1.625 mV
Lead Channel Sensing Intrinsic Amplitude: 5.375 mV
Lead Channel Sensing Intrinsic Amplitude: 5.375 mV
Lead Channel Setting Pacing Amplitude: 1.5 V
Lead Channel Setting Pacing Amplitude: 2.5 V
Lead Channel Setting Pacing Pulse Width: 0.4 ms
Lead Channel Setting Sensing Sensitivity: 2 mV

## 2020-06-24 ENCOUNTER — Other Ambulatory Visit: Payer: Self-pay | Admitting: Internal Medicine

## 2020-06-24 DIAGNOSIS — Z1231 Encounter for screening mammogram for malignant neoplasm of breast: Secondary | ICD-10-CM

## 2020-06-25 ENCOUNTER — Encounter: Payer: Self-pay | Admitting: Internal Medicine

## 2020-06-25 NOTE — Progress Notes (Signed)
Remote pacemaker transmission.   

## 2020-07-02 ENCOUNTER — Telehealth: Payer: Self-pay | Admitting: Internal Medicine

## 2020-07-02 NOTE — Telephone Encounter (Signed)
Not for the patient, for the patients daughter in law, she is wondering what Immunologist you recommended.

## 2020-07-04 NOTE — Telephone Encounter (Signed)
Do you mean allergist?

## 2020-07-04 NOTE — Telephone Encounter (Signed)
Mychart message sent for clarification

## 2020-07-08 NOTE — Telephone Encounter (Signed)
I recommend Dr. Neldon Mc

## 2020-08-04 ENCOUNTER — Encounter: Payer: Self-pay | Admitting: Internal Medicine

## 2020-08-04 ENCOUNTER — Ambulatory Visit (HOSPITAL_COMMUNITY)
Admission: RE | Admit: 2020-08-04 | Discharge: 2020-08-04 | Disposition: A | Discharge status: Home / Self Care | Payer: Medicare Other | Source: Ambulatory Visit | Attending: Internal Medicine | Admitting: Internal Medicine

## 2020-08-04 ENCOUNTER — Other Ambulatory Visit: Payer: Self-pay

## 2020-08-04 DIAGNOSIS — I361 Nonrheumatic tricuspid (valve) insufficiency: Secondary | ICD-10-CM | POA: Diagnosis not present

## 2020-08-04 DIAGNOSIS — R0602 Shortness of breath: Secondary | ICD-10-CM | POA: Diagnosis not present

## 2020-08-04 DIAGNOSIS — I340 Nonrheumatic mitral (valve) insufficiency: Secondary | ICD-10-CM | POA: Diagnosis not present

## 2020-08-04 LAB — ECHOCARDIOGRAM COMPLETE
Area-P 1/2: 3.3 cm2
S' Lateral: 1.96 cm

## 2020-08-04 NOTE — Progress Notes (Signed)
*  PRELIMINARY RESULTS* Echocardiogram 2D Echocardiogram has been performed.  Jessica Woodward 08/04/2020, 9:28 AM

## 2020-08-10 ENCOUNTER — Other Ambulatory Visit: Payer: Self-pay | Admitting: Internal Medicine

## 2020-08-10 DIAGNOSIS — M5030 Other cervical disc degeneration, unspecified cervical region: Secondary | ICD-10-CM

## 2020-08-10 DIAGNOSIS — E785 Hyperlipidemia, unspecified: Secondary | ICD-10-CM

## 2020-08-11 ENCOUNTER — Telehealth: Payer: Self-pay

## 2020-08-11 NOTE — Telephone Encounter (Signed)
-----   Message from Damian Leavell, RN sent at 08/11/2020  1:06 PM EDT ----- RDS pt

## 2020-08-11 NOTE — Telephone Encounter (Signed)
Patient was made aware of results and verbalized understanding. She has no questions or concerns as of now.

## 2020-08-21 ENCOUNTER — Other Ambulatory Visit: Payer: Self-pay | Admitting: Internal Medicine

## 2020-08-21 DIAGNOSIS — I10 Essential (primary) hypertension: Secondary | ICD-10-CM

## 2020-08-25 ENCOUNTER — Ambulatory Visit
Admission: RE | Admit: 2020-08-25 | Discharge: 2020-08-25 | Disposition: A | Discharge status: Home / Self Care | Payer: Medicare Other | Source: Ambulatory Visit | Attending: Internal Medicine | Admitting: Internal Medicine

## 2020-08-25 ENCOUNTER — Other Ambulatory Visit: Payer: Self-pay

## 2020-08-25 DIAGNOSIS — Z1231 Encounter for screening mammogram for malignant neoplasm of breast: Secondary | ICD-10-CM | POA: Diagnosis not present

## 2020-09-03 DIAGNOSIS — Z23 Encounter for immunization: Secondary | ICD-10-CM | POA: Diagnosis not present

## 2020-09-08 ENCOUNTER — Encounter: Payer: Self-pay | Admitting: Internal Medicine

## 2020-09-11 ENCOUNTER — Ambulatory Visit (INDEPENDENT_AMBULATORY_CARE_PROVIDER_SITE_OTHER): Payer: Medicare Other

## 2020-09-11 DIAGNOSIS — I459 Conduction disorder, unspecified: Secondary | ICD-10-CM

## 2020-09-11 LAB — CUP PACEART REMOTE DEVICE CHECK
Battery Remaining Longevity: 130 mo
Battery Voltage: 3.08 V
Brady Statistic AP VP Percent: 3.93 %
Brady Statistic AP VS Percent: 0 %
Brady Statistic AS VP Percent: 96.06 %
Brady Statistic AS VS Percent: 0.02 %
Brady Statistic RA Percent Paced: 3.91 %
Brady Statistic RV Percent Paced: 99.98 %
Date Time Interrogation Session: 20220421040951
Implantable Lead Implant Date: 20210720
Implantable Lead Implant Date: 20210720
Implantable Lead Location: 753859
Implantable Lead Location: 753860
Implantable Lead Model: 3830
Implantable Lead Model: 5076
Implantable Pulse Generator Implant Date: 20210720
Lead Channel Impedance Value: 209 Ohm
Lead Channel Impedance Value: 342 Ohm
Lead Channel Impedance Value: 380 Ohm
Lead Channel Impedance Value: 494 Ohm
Lead Channel Pacing Threshold Amplitude: 0.625 V
Lead Channel Pacing Threshold Amplitude: 0.625 V
Lead Channel Pacing Threshold Pulse Width: 0.4 ms
Lead Channel Pacing Threshold Pulse Width: 0.4 ms
Lead Channel Sensing Intrinsic Amplitude: 1.25 mV
Lead Channel Sensing Intrinsic Amplitude: 1.25 mV
Lead Channel Sensing Intrinsic Amplitude: 5.375 mV
Lead Channel Sensing Intrinsic Amplitude: 5.375 mV
Lead Channel Setting Pacing Amplitude: 1.5 V
Lead Channel Setting Pacing Amplitude: 2.5 V
Lead Channel Setting Pacing Pulse Width: 0.4 ms
Lead Channel Setting Sensing Sensitivity: 2 mV

## 2020-09-30 ENCOUNTER — Encounter (INDEPENDENT_AMBULATORY_CARE_PROVIDER_SITE_OTHER): Payer: Self-pay

## 2020-09-30 NOTE — Progress Notes (Signed)
Remote pacemaker transmission.   

## 2020-10-29 ENCOUNTER — Encounter: Payer: Self-pay | Admitting: Internal Medicine

## 2020-11-03 ENCOUNTER — Other Ambulatory Visit: Payer: Self-pay | Admitting: Internal Medicine

## 2020-11-03 ENCOUNTER — Encounter: Payer: Self-pay | Admitting: Internal Medicine

## 2020-11-03 DIAGNOSIS — Z23 Encounter for immunization: Secondary | ICD-10-CM

## 2020-11-03 MED ORDER — SHINGRIX 50 MCG/0.5ML IM SUSR: 0.5000 mL | Freq: Once | INTRAMUSCULAR | 1 refills | Status: AC

## 2020-11-03 MED ORDER — SHINGRIX 50 MCG/0.5ML IM SUSR: 0.5000 mL | Freq: Once | INTRAMUSCULAR | 1 refills | Status: DC

## 2020-11-10 ENCOUNTER — Other Ambulatory Visit: Payer: Self-pay | Admitting: Internal Medicine

## 2020-11-10 DIAGNOSIS — I10 Essential (primary) hypertension: Secondary | ICD-10-CM

## 2020-11-10 DIAGNOSIS — M5030 Other cervical disc degeneration, unspecified cervical region: Secondary | ICD-10-CM

## 2020-11-10 DIAGNOSIS — D0500 Lobular carcinoma in situ of unspecified breast: Secondary | ICD-10-CM

## 2020-11-10 DIAGNOSIS — E785 Hyperlipidemia, unspecified: Secondary | ICD-10-CM

## 2020-12-04 DIAGNOSIS — L820 Inflamed seborrheic keratosis: Secondary | ICD-10-CM | POA: Diagnosis not present

## 2020-12-04 DIAGNOSIS — Z85828 Personal history of other malignant neoplasm of skin: Secondary | ICD-10-CM | POA: Diagnosis not present

## 2020-12-04 DIAGNOSIS — L570 Actinic keratosis: Secondary | ICD-10-CM | POA: Diagnosis not present

## 2020-12-11 ENCOUNTER — Ambulatory Visit (INDEPENDENT_AMBULATORY_CARE_PROVIDER_SITE_OTHER): Payer: Medicare Other

## 2020-12-11 DIAGNOSIS — I442 Atrioventricular block, complete: Secondary | ICD-10-CM

## 2020-12-11 LAB — CUP PACEART REMOTE DEVICE CHECK
Battery Remaining Longevity: 129 mo
Battery Voltage: 3.04 V
Brady Statistic AP VP Percent: 10.8 %
Brady Statistic AP VS Percent: 0 %
Brady Statistic AS VP Percent: 89.19 %
Brady Statistic AS VS Percent: 0.01 %
Brady Statistic RA Percent Paced: 10.77 %
Brady Statistic RV Percent Paced: 99.99 %
Date Time Interrogation Session: 20220721053834
Implantable Lead Implant Date: 20210720
Implantable Lead Implant Date: 20210720
Implantable Lead Location: 753859
Implantable Lead Location: 753860
Implantable Lead Model: 3830
Implantable Lead Model: 5076
Implantable Pulse Generator Implant Date: 20210720
Lead Channel Impedance Value: 247 Ohm
Lead Channel Impedance Value: 361 Ohm
Lead Channel Impedance Value: 418 Ohm
Lead Channel Impedance Value: 532 Ohm
Lead Channel Pacing Threshold Amplitude: 0.75 V
Lead Channel Pacing Threshold Amplitude: 0.75 V
Lead Channel Pacing Threshold Pulse Width: 0.4 ms
Lead Channel Pacing Threshold Pulse Width: 0.4 ms
Lead Channel Sensing Intrinsic Amplitude: 1.375 mV
Lead Channel Sensing Intrinsic Amplitude: 1.375 mV
Lead Channel Sensing Intrinsic Amplitude: 5.375 mV
Lead Channel Sensing Intrinsic Amplitude: 5.375 mV
Lead Channel Setting Pacing Amplitude: 1.5 V
Lead Channel Setting Pacing Amplitude: 2.5 V
Lead Channel Setting Pacing Pulse Width: 0.4 ms
Lead Channel Setting Sensing Sensitivity: 2 mV

## 2020-12-29 ENCOUNTER — Encounter: Payer: Self-pay | Admitting: Internal Medicine

## 2020-12-29 ENCOUNTER — Other Ambulatory Visit: Payer: Self-pay | Admitting: Internal Medicine

## 2020-12-29 DIAGNOSIS — H9193 Unspecified hearing loss, bilateral: Secondary | ICD-10-CM | POA: Insufficient documentation

## 2021-01-01 NOTE — Progress Notes (Signed)
Remote pacemaker transmission.   

## 2021-01-09 ENCOUNTER — Telehealth: Payer: Self-pay | Admitting: Internal Medicine

## 2021-01-09 NOTE — Telephone Encounter (Signed)
Sarah from outpatient rehab center calling  Called to schedule patient visit but patient complaining of burning & itching of ears  Judson Roch req referral be rerouted to ENT  Callback [phone] , audiology department

## 2021-01-11 ENCOUNTER — Other Ambulatory Visit: Payer: Self-pay | Admitting: Internal Medicine

## 2021-01-11 DIAGNOSIS — M5030 Other cervical disc degeneration, unspecified cervical region: Secondary | ICD-10-CM

## 2021-01-12 ENCOUNTER — Other Ambulatory Visit: Payer: Self-pay | Admitting: Internal Medicine

## 2021-01-12 ENCOUNTER — Ambulatory Visit: Payer: Medicare Other | Admitting: Audiologist

## 2021-01-12 DIAGNOSIS — H9193 Unspecified hearing loss, bilateral: Secondary | ICD-10-CM

## 2021-01-21 ENCOUNTER — Other Ambulatory Visit: Payer: Self-pay

## 2021-01-21 ENCOUNTER — Encounter: Payer: Self-pay | Admitting: Internal Medicine

## 2021-01-21 ENCOUNTER — Ambulatory Visit (INDEPENDENT_AMBULATORY_CARE_PROVIDER_SITE_OTHER): Payer: Medicare Other | Admitting: Internal Medicine

## 2021-01-21 VITALS — BP 118/78 | HR 75 | Temp 98.2°F | Resp 16 | Ht 64.0 in | Wt 144.0 lb

## 2021-01-21 DIAGNOSIS — I700 Atherosclerosis of aorta: Secondary | ICD-10-CM | POA: Diagnosis not present

## 2021-01-21 DIAGNOSIS — I10 Essential (primary) hypertension: Secondary | ICD-10-CM

## 2021-01-21 DIAGNOSIS — D0500 Lobular carcinoma in situ of unspecified breast: Secondary | ICD-10-CM | POA: Diagnosis not present

## 2021-01-21 DIAGNOSIS — Z23 Encounter for immunization: Secondary | ICD-10-CM | POA: Insufficient documentation

## 2021-01-21 DIAGNOSIS — E785 Hyperlipidemia, unspecified: Secondary | ICD-10-CM | POA: Diagnosis not present

## 2021-01-21 DIAGNOSIS — M5030 Other cervical disc degeneration, unspecified cervical region: Secondary | ICD-10-CM

## 2021-01-21 DIAGNOSIS — N1831 Chronic kidney disease, stage 3a: Secondary | ICD-10-CM | POA: Diagnosis not present

## 2021-01-21 LAB — CBC WITH DIFFERENTIAL/PLATELET
Basophils Absolute: 0 10*3/uL (ref 0.0–0.1)
Basophils Relative: 0.8 % (ref 0.0–3.0)
Eosinophils Absolute: 0.1 10*3/uL (ref 0.0–0.7)
Eosinophils Relative: 1.9 % (ref 0.0–5.0)
HCT: 38.9 % (ref 36.0–46.0)
Hemoglobin: 13.1 g/dL (ref 12.0–15.0)
Lymphocytes Relative: 27.7 % (ref 12.0–46.0)
Lymphs Abs: 1.5 10*3/uL (ref 0.7–4.0)
MCHC: 33.5 g/dL (ref 30.0–36.0)
MCV: 92.3 fl (ref 78.0–100.0)
Monocytes Absolute: 0.5 10*3/uL (ref 0.1–1.0)
Monocytes Relative: 9.3 % (ref 3.0–12.0)
Neutro Abs: 3.2 10*3/uL (ref 1.4–7.7)
Neutrophils Relative %: 60.3 % (ref 43.0–77.0)
Platelets: 229 10*3/uL (ref 150.0–400.0)
RBC: 4.22 Mil/uL (ref 3.87–5.11)
RDW: 13.8 % (ref 11.5–15.5)
WBC: 5.4 10*3/uL (ref 4.0–10.5)

## 2021-01-21 LAB — URINALYSIS, ROUTINE W REFLEX MICROSCOPIC
Bilirubin Urine: NEGATIVE
Hgb urine dipstick: NEGATIVE
Ketones, ur: NEGATIVE
Leukocytes,Ua: NEGATIVE
Nitrite: NEGATIVE
RBC / HPF: NONE SEEN (ref 0–?)
Specific Gravity, Urine: <=1.005 — AB (ref 1.000–1.030)
Total Protein, Urine: NEGATIVE
Urine Glucose: NEGATIVE
Urobilinogen, UA: 0.2 (ref 0.0–1.0)
pH: 6 (ref 5.0–8.0)

## 2021-01-21 LAB — BASIC METABOLIC PANEL
BUN: 17 mg/dL (ref 6–23)
CO2: 24 mEq/L (ref 19–32)
Calcium: 9.8 mg/dL (ref 8.4–10.5)
Chloride: 109 mEq/L (ref 96–112)
Creatinine, Ser: 0.98 mg/dL (ref 0.40–1.20)
GFR: 54.87 mL/min — ABNORMAL LOW (ref >60.00)
Glucose, Bld: 89 mg/dL (ref 70–99)
Potassium: 4.1 mEq/L (ref 3.5–5.1)
Sodium: 142 mEq/L (ref 135–145)

## 2021-01-21 LAB — LIPID PANEL
Cholesterol: 160 mg/dL (ref 0–200)
HDL: 65.2 mg/dL (ref >39.00)
LDL Cholesterol: 79 mg/dL (ref 0–99)
NonHDL: 94.68
Total CHOL/HDL Ratio: 2
Triglycerides: 77 mg/dL (ref 0.0–149.0)
VLDL: 15.4 mg/dL (ref 0.0–40.0)

## 2021-01-21 LAB — HEPATIC FUNCTION PANEL
ALT: 15 U/L (ref 0–35)
AST: 21 U/L (ref 0–37)
Albumin: 4.1 g/dL (ref 3.5–5.2)
Alkaline Phosphatase: 63 U/L (ref 39–117)
Bilirubin, Direct: 0.1 mg/dL (ref 0.0–0.3)
Total Bilirubin: 0.6 mg/dL (ref 0.2–1.2)
Total Protein: 6.7 g/dL (ref 6.0–8.3)

## 2021-01-21 LAB — TSH: TSH: 3.92 u[IU]/mL (ref 0.35–5.50)

## 2021-01-21 MED ORDER — ATORVASTATIN CALCIUM 40 MG PO TABS: 40.0000 mg | ORAL_TABLET | Freq: Every day | ORAL | 0 refills | Status: DC

## 2021-01-21 MED ORDER — RALOXIFENE HCL 60 MG PO TABS: 60.0000 mg | ORAL_TABLET | Freq: Every day | ORAL | 0 refills | Status: DC

## 2021-01-21 MED ORDER — MELOXICAM 7.5 MG PO TABS: 7.5000 mg | ORAL_TABLET | Freq: Every day | ORAL | 0 refills | Status: DC

## 2021-01-21 NOTE — Patient Instructions (Signed)

## 2021-01-21 NOTE — Progress Notes (Signed)
Subjective:  Patient ID: Jessica Woodward, female    DOB: 12-02-1940  Age: 80 y.o. MRN: [id number]  CC: Hypertension and Hyperlipidemia  This visit occurred during the SARS-CoV-2 public health emergency.  Safety protocols were in place, including screening questions prior to the visit, additional usage of staff PPE, and extensive cleaning of exam room while observing appropriate contact time as indicated for disinfecting solutions.    HPI Jessica Woodward presents for f/up -  She walks about 40 minutes a day and does not experience chest pain, shortness of breath, palpitations, edema, or diaphoresis.  Outpatient Medications Prior to Visit  Medication Sig Dispense Refill   acetaminophen (TYLENOL) 325 MG tablet Take 1-2 tablets (325-650 mg total) by mouth every 4 (four) hours as needed for mild pain.     cyclobenzaprine (FLEXERIL) 5 MG tablet Take 1 tablet (5 mg total) by mouth 3 (three) times daily as needed for muscle spasms. 90 tablet 2   irbesartan (AVAPRO) 150 MG tablet TAKE 1 TABLET BY MOUTH  DAILY 90 tablet 0   loratadine (CLARITIN) 10 MG tablet Take 10 mg by mouth daily.     Multiple Vitamin (MULTIVITAMIN) tablet Take 1 tablet by mouth daily.     triamcinolone cream (KENALOG) 0.1 % Apply 1 application topically 4 (four) times daily as needed (rash).      atorvastatin (LIPITOR) 40 MG tablet TAKE 1 TABLET BY MOUTH  DAILY 90 tablet 0   meloxicam (MOBIC) 7.5 MG tablet TAKE 1 TABLET BY MOUTH  DAILY 90 tablet 0   raloxifene (EVISTA) 60 MG tablet TAKE 1 TABLET BY MOUTH  DAILY 90 tablet 0   No facility-administered medications prior to visit.    ROS Review of Systems  Constitutional:  Negative for diaphoresis, fatigue and unexpected weight change.  HENT: Negative.    Eyes: Negative.   Respiratory:  Negative for cough, chest tightness, shortness of breath and wheezing.   Cardiovascular:  Negative for chest pain, palpitations and leg swelling.  Gastrointestinal:  Negative for abdominal  pain, constipation, diarrhea, nausea and vomiting.  Endocrine: Negative.   Genitourinary: Negative.  Negative for difficulty urinating.  Musculoskeletal:  Positive for arthralgias and neck pain. Negative for back pain and myalgias.  Skin: Negative.  Negative for color change.  Neurological:  Negative for dizziness, weakness and light-headedness.  Hematological:  Negative for adenopathy. Does not bruise/bleed easily.  Psychiatric/Behavioral: Negative.     Objective:  BP 118/78 (BP Location: Left Arm, Patient Position: Sitting, Cuff Size: Large)   Pulse 75   Temp 98.2 F (36.8 C) (Oral)   Resp 16   Ht '5\' 4"'$  (1.626 m)   Wt 144 lb (65.3 kg)   SpO2 99%   BMI 24.72 kg/m   BP Readings from Last 3 Encounters:  01/21/21 118/78  04/24/20 123/75  03/27/20 124/76    Wt Readings from Last 3 Encounters:  01/21/21 144 lb (65.3 kg)  04/24/20 149 lb 0.5 oz (67.6 kg)  03/27/20 149 lb (67.6 kg)    Physical Exam Vitals reviewed.  HENT:     Nose: Nose normal.     Mouth/Throat:     Mouth: Mucous membranes are moist.  Eyes:     Conjunctiva/sclera: Conjunctivae normal.  Cardiovascular:     Rate and Rhythm: Normal rate and regular rhythm.     Heart sounds: No murmur heard. Pulmonary:     Effort: Pulmonary effort is normal.     Breath sounds: No stridor. No wheezing, rhonchi or  rales.  Abdominal:     General: Abdomen is flat.     Palpations: There is no mass.     Tenderness: There is no abdominal tenderness. There is no guarding.     Hernia: No hernia is present.  Musculoskeletal:        General: Normal range of motion.     Cervical back: Neck supple.     Right lower leg: No edema.     Left lower leg: No edema.  Lymphadenopathy:     Cervical: No cervical adenopathy.  Skin:    General: Skin is warm and dry.  Neurological:     General: No focal deficit present.     Mental Status: She is alert.    Lab Results  Component Value Date   WBC 5.4 01/21/2021   HGB 13.1 01/21/2021    HCT 38.9 01/21/2021   PLT 229.0 01/21/2021   GLUCOSE 89 01/21/2021   CHOL 160 01/21/2021   TRIG 77.0 01/21/2021   HDL 65.20 01/21/2021   LDLDIRECT 166.9 03/23/2013   LDLCALC 79 01/21/2021   ALT 15 01/21/2021   AST 21 01/21/2021   NA 142 01/21/2021   K 4.1 01/21/2021   CL 109 01/21/2021   CREATININE 0.98 01/21/2021   BUN 17 01/21/2021   CO2 24 01/21/2021   TSH 3.92 01/21/2021   HGBA1C 5.4 01/17/2012    MM 3D SCREEN BREAST BILATERAL  Result Date: 08/26/2020 CLINICAL DATA:  Screening. EXAM: DIGITAL SCREENING BILATERAL MAMMOGRAM WITH TOMOSYNTHESIS AND CAD TECHNIQUE: Bilateral screening digital craniocaudal and mediolateral oblique mammograms were obtained. Bilateral screening digital breast tomosynthesis was performed. The images were evaluated with computer-aided detection. COMPARISON:  Previous exam(s). ACR Breast Density Category b: There are scattered areas of fibroglandular density. FINDINGS: There are no findings suspicious for malignancy. The images were evaluated with computer-aided detection. IMPRESSION: No mammographic evidence of malignancy. A result letter of this screening mammogram will be mailed directly to the patient. RECOMMENDATION: Screening mammogram in one year. (Code:SM-B-01Y) BI-RADS CATEGORY  1: Negative. Electronically Signed   By: Kristopher Oppenheim M.D.   On: 08/26/2020 09:04    Assessment & Plan:   [place] was seen today for hypertension and hyperlipidemia.  Diagnoses and all orders for this visit:  Flu vaccine need -     Flu Vaccine QUAD High Dose(Fluad)  Dyslipidemia, goal LDL below 130- LDL goal achieved. Doing well on the statin  -     atorvastatin (LIPITOR) 40 MG tablet; Take 1 tablet (40 mg total) by mouth daily. -     Lipid panel; Future -     Hepatic function panel; Future -     TSH; Future -     TSH -     Hepatic function panel -     Lipid panel  Essential hypertension, benign- Her BP is well controlled. -     CBC with Differential/Platelet;  Future -     Basic metabolic panel; Future -     TSH; Future -     Urinalysis, Routine w reflex microscopic; Future -     Urinalysis, Routine w reflex microscopic -     TSH -     Basic metabolic panel -     CBC with Differential/Platelet  DDD (degenerative disc disease), cervical -     meloxicam (MOBIC) 7.5 MG tablet; Take 1 tablet (7.5 mg total) by mouth daily.  Lobular carcinoma in situ (LCIS) of breast -     raloxifene (EVISTA) 60 MG tablet; Take  1 tablet (60 mg total) by mouth daily.  Atherosclerosis of aorta ([place])- Risk factor modifications addressed. -     atorvastatin (LIPITOR) 40 MG tablet; Take 1 tablet (40 mg total) by mouth daily. -     Lipid panel; Future -     Lipid panel  Stage 3a chronic kidney disease ([place])- Her blood pressure is well controlled.  I have changed Katharine Look C. Boucher "Sandy"'s atorvastatin, meloxicam, and raloxifene. I am also having her maintain her triamcinolone cream, multivitamin, loratadine, cyclobenzaprine, acetaminophen, and irbesartan.  Meds ordered this encounter  Medications   atorvastatin (LIPITOR) 40 MG tablet    Sig: Take 1 tablet (40 mg total) by mouth daily.    Dispense:  90 tablet    Refill:  0   meloxicam (MOBIC) 7.5 MG tablet    Sig: Take 1 tablet (7.5 mg total) by mouth daily.    Dispense:  90 tablet    Refill:  0   raloxifene (EVISTA) 60 MG tablet    Sig: Take 1 tablet (60 mg total) by mouth daily.    Dispense:  90 tablet    Refill:  0     Follow-up: Return in about 6 months (around 07/21/2021).  Scarlette Calico, MD

## 2021-01-22 ENCOUNTER — Encounter: Payer: Self-pay | Admitting: Internal Medicine

## 2021-01-23 ENCOUNTER — Encounter: Payer: Self-pay | Admitting: Internal Medicine

## 2021-01-23 ENCOUNTER — Other Ambulatory Visit: Payer: Self-pay | Admitting: Internal Medicine

## 2021-01-27 ENCOUNTER — Encounter: Payer: Self-pay | Admitting: Internal Medicine

## 2021-02-02 ENCOUNTER — Encounter: Payer: Self-pay | Admitting: Internal Medicine

## 2021-02-02 ENCOUNTER — Other Ambulatory Visit: Payer: Self-pay | Admitting: Internal Medicine

## 2021-02-03 ENCOUNTER — Telehealth: Payer: Self-pay | Admitting: Internal Medicine

## 2021-02-03 ENCOUNTER — Other Ambulatory Visit: Payer: Self-pay | Admitting: Internal Medicine

## 2021-02-03 DIAGNOSIS — I10 Essential (primary) hypertension: Secondary | ICD-10-CM

## 2021-02-03 MED ORDER — IRBESARTAN 75 MG PO TABS: 75.0000 mg | ORAL_TABLET | Freq: Every day | ORAL | 1 refills | Status: DC

## 2021-02-03 NOTE — Telephone Encounter (Signed)
Patient calling in concerned regarding BP  Says if she doesn't take bp med then bp goes extremely high.. but if she takes bp med then it drops low  Wants to know if she needs to change dosage of bp meds or what she needs to do to get it stable  Please call patient [phone]

## 2021-02-03 NOTE — Telephone Encounter (Signed)
MyChart message in regard was forwarded to PCP on 9/12 @ 2.38pm. Pt should wait for PCPs response/recommendation, typically within 24-48 business hours, or pt can schedule a follow up OV to discuss in further immediate detail. Thanks!

## 2021-02-05 ENCOUNTER — Telehealth: Payer: Self-pay | Admitting: Internal Medicine

## 2021-02-05 NOTE — Telephone Encounter (Signed)
Successful telephone encounter to patient to follow up on complaint of "sore lump" under her skin/edge of pacemaker. Patient described lump as size of large pea and tender to touch. Denies s/s of infection including erythemia/warm to touch or edema. She is scheduled for device clinic site check Wednesday 02/11/21 at 9:40 am with the understanding if her symptoms worsen she is to call device clinic or seek urgent care. She will also call device clinic if her symptoms improve or complete resolve prior to appointment for additional telephone assessment. Patient appreciative of call.

## 2021-02-05 NOTE — Telephone Encounter (Signed)
   Pt said she felt a lump under her skin where her pacemaker is she said it is sore, she noticed it last Tuesday when she was out of town. She wanted to know if she needs an appt to get it check

## 2021-02-09 ENCOUNTER — Other Ambulatory Visit: Payer: Self-pay | Admitting: Internal Medicine

## 2021-02-09 DIAGNOSIS — E785 Hyperlipidemia, unspecified: Secondary | ICD-10-CM

## 2021-02-09 DIAGNOSIS — I700 Atherosclerosis of aorta: Secondary | ICD-10-CM

## 2021-02-10 ENCOUNTER — Telehealth: Payer: Self-pay | Admitting: Internal Medicine

## 2021-02-10 NOTE — Telephone Encounter (Signed)
Pt reports she awoke this morning feeling dizzy/ lightheaded.  She does not feel like she might pass out.  Her BP was 158/85, 2 hours later her BP was 112/80.  She no longer takes irbesartan 75mg , she was advised by Dr. Ronnald Ramp (PCP) to stop 3 weeks ago due to low BP.  Pt reports the only prescription medication she is currently taking is Atorvastatin.    Assisted pt with sending a manual transmission from her PPM.  Transmission reviewed.  Normal device function, no arrhythmias logged.  Presenting rate ASVP 90s.    Advised pt that there is nothing on her PPM transmission to explain her symptoms.  She will f/u with her PCP to determine if symptoms related to non-cardiac issue.  Pt has an appt tomorrow to assess new lump noted at PPM incision.

## 2021-02-10 NOTE — Telephone Encounter (Signed)
STAT if patient feels like he/she is going to faint   Are you dizzy now? Yes, started this morning "woozy"  Do you feel faint or have you passed out? no  Do you have any other symptoms? Lump by pacemaker  Have you checked your HR and BP (record if available)? 158/85 forgot HR, 112/80 HR 91   Patient states she has a lump by her pacemaker and has an appointment tomorrow, but started feeling dizzy this morning. She says she feels "woozy", but does not feel like she will pass out. She states her BP was 158/85, but that it has come down to 112/80. She says she has no other symptoms.

## 2021-02-11 ENCOUNTER — Other Ambulatory Visit: Payer: Self-pay

## 2021-02-11 ENCOUNTER — Ambulatory Visit (INDEPENDENT_AMBULATORY_CARE_PROVIDER_SITE_OTHER): Payer: Medicare Other

## 2021-02-11 DIAGNOSIS — I459 Conduction disorder, unspecified: Secondary | ICD-10-CM

## 2021-02-11 NOTE — Progress Notes (Signed)
Patient seen in device clinic today for lumps noted above incision. No redness of swelling noted. Dr. Lovena Le in to see patient and states the lumps are due to healing with wires post implant. Patient states she has lost about 10 lbs and could be more prominent including in the future per Dr. Lovena Le. Patient advised to call back with any further questions or concerns.

## 2021-02-12 ENCOUNTER — Telehealth: Payer: Self-pay | Admitting: Internal Medicine

## 2021-02-12 DIAGNOSIS — I10 Essential (primary) hypertension: Secondary | ICD-10-CM

## 2021-02-12 NOTE — Telephone Encounter (Signed)
Patient wants to know if provider can send irbesartan (AVAPRO) 75 MG tablet to mail order pharmacy  Pharmacy: [facility]  ([place], [place]  Phone:  [phone] Fax:  [phone]

## 2021-02-13 MED ORDER — IRBESARTAN 75 MG PO TABS
75.0000 mg | ORAL_TABLET | Freq: Every day | ORAL | 1 refills | Status: DC
Start: 2021-02-13 — End: 2021-06-25

## 2021-03-12 ENCOUNTER — Ambulatory Visit (INDEPENDENT_AMBULATORY_CARE_PROVIDER_SITE_OTHER): Payer: Medicare Other

## 2021-03-12 DIAGNOSIS — I442 Atrioventricular block, complete: Secondary | ICD-10-CM | POA: Diagnosis not present

## 2021-03-12 LAB — CUP PACEART REMOTE DEVICE CHECK
Battery Remaining Longevity: 128 mo
Battery Voltage: 3.02 V
Brady Statistic AP VP Percent: 6.53 %
Brady Statistic AP VS Percent: 0 %
Brady Statistic AS VP Percent: 93.47 %
Brady Statistic AS VS Percent: 0.01 %
Brady Statistic RA Percent Paced: 6.5 %
Brady Statistic RV Percent Paced: 99.99 %
Date Time Interrogation Session: 20221020061846
Implantable Lead Implant Date: 20210720
Implantable Lead Implant Date: 20210720
Implantable Lead Location: 753859
Implantable Lead Location: 753860
Implantable Lead Model: 3830
Implantable Lead Model: 5076
Implantable Pulse Generator Implant Date: 20210720
Lead Channel Impedance Value: 266 Ohm
Lead Channel Impedance Value: 342 Ohm
Lead Channel Impedance Value: 418 Ohm
Lead Channel Impedance Value: 570 Ohm
Lead Channel Pacing Threshold Amplitude: 0.625 V
Lead Channel Pacing Threshold Amplitude: 1 V
Lead Channel Pacing Threshold Pulse Width: 0.4 ms
Lead Channel Pacing Threshold Pulse Width: 0.4 ms
Lead Channel Sensing Intrinsic Amplitude: 1.875 mV
Lead Channel Sensing Intrinsic Amplitude: 1.875 mV
Lead Channel Sensing Intrinsic Amplitude: 5.375 mV
Lead Channel Sensing Intrinsic Amplitude: 5.375 mV
Lead Channel Setting Pacing Amplitude: 1.5 V
Lead Channel Setting Pacing Amplitude: 2.5 V
Lead Channel Setting Pacing Pulse Width: 0.4 ms
Lead Channel Setting Sensing Sensitivity: 2 mV

## 2021-03-16 DIAGNOSIS — H2513 Age-related nuclear cataract, bilateral: Secondary | ICD-10-CM | POA: Diagnosis not present

## 2021-03-16 DIAGNOSIS — H04123 Dry eye syndrome of bilateral lacrimal glands: Secondary | ICD-10-CM | POA: Diagnosis not present

## 2021-03-16 DIAGNOSIS — H43811 Vitreous degeneration, right eye: Secondary | ICD-10-CM | POA: Diagnosis not present

## 2021-03-19 ENCOUNTER — Encounter: Payer: Self-pay | Admitting: Internal Medicine

## 2021-03-19 NOTE — Progress Notes (Signed)
Remote pacemaker transmission.   

## 2021-03-21 DIAGNOSIS — U071 COVID-19: Secondary | ICD-10-CM | POA: Diagnosis not present

## 2021-03-21 DIAGNOSIS — Z23 Encounter for immunization: Secondary | ICD-10-CM | POA: Diagnosis not present

## 2021-03-24 ENCOUNTER — Ambulatory Visit (INDEPENDENT_AMBULATORY_CARE_PROVIDER_SITE_OTHER): Payer: Medicare Other

## 2021-03-24 ENCOUNTER — Other Ambulatory Visit: Payer: Self-pay

## 2021-03-24 VITALS — BP 102/70 | HR 69 | Temp 97.6°F | Ht 64.0 in | Wt 143.6 lb

## 2021-03-24 DIAGNOSIS — Z0000 Encounter for general adult medical examination without abnormal findings: Secondary | ICD-10-CM | POA: Diagnosis not present

## 2021-03-24 NOTE — Patient Instructions (Signed)
Jessica Woodward , Thank you for taking time to come for your Medicare Wellness Visit. I appreciate your ongoing commitment to your health goals. Please review the following plan we discussed and let me know if I can assist you in the future.   Screening recommendations/referrals: Colonoscopy: Not a candidate for screening due to age 80: 08/25/2020; due very 1-2 years Bone Density: 01/31/2018; due every 5 years Recommended yearly ophthalmology/optometry visit for glaucoma screening and checkup Recommended yearly dental visit for hygiene and checkup  Vaccinations: Influenza vaccine: 01/21/2021 Pneumococcal vaccine: 03/28/2013, 04/15/2015 Tdap vaccine: 03/06/2020; due every 10 years Shingles vaccine: 11/05/2020 , 02/27/2021 Covid-19: 06/07/2019, 06/26/2019, 02/07/2020, 09/04/2020, 03/13/2021  Advanced directives: Please bring a copy of your health care power of attorney and living will to the office at your convenience.  Conditions/risks identified: Yes; Client understands the importance of follow-up with providers by attending scheduled visits and discussed goals to eat healthier, increase physical activity, exercise the brain, socialize more, get enough sleep and make time for laughter.  Next appointment: Please schedule your next Medicare Wellness Visit with your Nurse Health Advisor in 1 year by calling [phone].   Preventive Care 34 Years and Older, Female Preventive care refers to lifestyle choices and visits with your health care provider that can promote health and wellness. What does preventive care include? A yearly physical exam. This is also called an annual well check. Dental exams once or twice a year. Routine eye exams. Ask your health care provider how often you should have your eyes checked. Personal lifestyle choices, including: Daily care of your teeth and gums. Regular physical activity. Eating a healthy diet. Avoiding tobacco and drug use. Limiting alcohol  use. Practicing safe sex. Taking low-dose aspirin every day. Taking vitamin and mineral supplements as recommended by your health care provider. What happens during an annual well check? The services and screenings done by your health care provider during your annual well check will depend on your age, overall health, lifestyle risk factors, and family history of disease. Counseling  Your health care provider may ask you questions about your: Alcohol use. Tobacco use. Drug use. Emotional well-being. Home and relationship well-being. Sexual activity. Eating habits. History of falls. Memory and ability to understand (cognition). Work and work Statistician. Reproductive health. Screening  You may have the following tests or measurements: Height, weight, and BMI. Blood pressure. Lipid and cholesterol levels. These may be checked every 5 years, or more frequently if you are over 89 years old. Skin check. Lung cancer screening. You may have this screening every year starting at age 51 if you have a 30-pack-year history of smoking and currently smoke or have quit within the past 15 years. Fecal occult blood test (FOBT) of the stool. You may have this test every year starting at age 7. Flexible sigmoidoscopy or colonoscopy. You may have a sigmoidoscopy every 5 years or a colonoscopy every 10 years starting at age 54. Hepatitis C blood test. Hepatitis B blood test. Sexually transmitted disease (STD) testing. Diabetes screening. This is done by checking your blood sugar (glucose) after you have not eaten for a while (fasting). You may have this done every 1-3 years. Bone density scan. This is done to screen for osteoporosis. You may have this done starting at age 22. Mammogram. This may be done every 1-2 years. Talk to your health care provider about how often you should have regular mammograms. Talk with your health care provider about your test results, treatment options, and if necessary,  the need for more tests. Vaccines  Your health care provider may recommend certain vaccines, such as: Influenza vaccine. This is recommended every year. Tetanus, diphtheria, and acellular pertussis (Tdap, Td) vaccine. You may need a Td booster every 10 years. Zoster vaccine. You may need this after age 11. Pneumococcal 13-valent conjugate (PCV13) vaccine. One dose is recommended after age 37. Pneumococcal polysaccharide (PPSV23) vaccine. One dose is recommended after age 71. Talk to your health care provider about which screenings and vaccines you need and how often you need them. This information is not intended to replace advice given to you by your health care provider. Make sure you discuss any questions you have with your health care provider. Document Released: 06/06/2015 Document Revised: 01/28/2016 Document Reviewed: 03/11/2015 Elsevier Interactive Patient Education  2017 [place] Prevention in the Home Falls can cause injuries. They can happen to people of all ages. There are many things you can do to make your home safe and to help prevent falls. What can I do on the outside of my home? Regularly fix the edges of walkways and driveways and fix any cracks. Remove anything that might make you trip as you walk through a door, such as a raised step or threshold. Trim any bushes or trees on the path to your home. Use bright outdoor lighting. Clear any walking paths of anything that might make someone trip, such as rocks or tools. Regularly check to see if handrails are loose or broken. Make sure that both sides of any steps have handrails. Any raised decks and porches should have guardrails on the edges. Have any leaves, snow, or ice cleared regularly. Use sand or salt on walking paths during winter. Clean up any spills in your garage right away. This includes oil or grease spills. What can I do in the bathroom? Use night lights. Install grab bars by the toilet and in the  tub and shower. Do not use towel bars as grab bars. Use non-skid mats or decals in the tub or shower. If you need to sit down in the shower, use a plastic, non-slip stool. Keep the floor dry. Clean up any water that spills on the floor as soon as it happens. Remove soap buildup in the tub or shower regularly. Attach bath mats securely with double-sided non-slip rug tape. Do not have throw rugs and other things on the floor that can make you trip. What can I do in the bedroom? Use night lights. Make sure that you have a light by your bed that is easy to reach. Do not use any sheets or blankets that are too big for your bed. They should not hang down onto the floor. Have a firm chair that has side arms. You can use this for support while you get dressed. Do not have throw rugs and other things on the floor that can make you trip. What can I do in the kitchen? Clean up any spills right away. Avoid walking on wet floors. Keep items that you use a lot in easy-to-reach places. If you need to reach something above you, use a strong step stool that has a grab bar. Keep electrical cords out of the way. Do not use floor polish or wax that makes floors slippery. If you must use wax, use non-skid floor wax. Do not have throw rugs and other things on the floor that can make you trip. What can I do with my stairs? Do not leave any items on the  stairs. Make sure that there are handrails on both sides of the stairs and use them. Fix handrails that are broken or loose. Make sure that handrails are as long as the stairways. Check any carpeting to make sure that it is firmly attached to the stairs. Fix any carpet that is loose or worn. Avoid having throw rugs at the top or bottom of the stairs. If you do have throw rugs, attach them to the floor with carpet tape. Make sure that you have a light switch at the top of the stairs and the bottom of the stairs. If you do not have them, ask someone to add them for  you. What else can I do to help prevent falls? Wear shoes that: Do not have high heels. Have rubber bottoms. Are comfortable and fit you well. Are closed at the toe. Do not wear sandals. If you use a stepladder: Make sure that it is fully opened. Do not climb a closed stepladder. Make sure that both sides of the stepladder are locked into place. Ask someone to hold it for you, if possible. Clearly mark and make sure that you can see: Any grab bars or handrails. First and last steps. Where the edge of each step is. Use tools that help you move around (mobility aids) if they are needed. These include: Canes. Walkers. Scooters. Crutches. Turn on the lights when you go into a dark area. Replace any light bulbs as soon as they burn out. Set up your furniture so you have a clear path. Avoid moving your furniture around. If any of your floors are uneven, fix them. If there are any pets around you, be aware of where they are. Review your medicines with your doctor. Some medicines can make you feel dizzy. This can increase your chance of falling. Ask your doctor what other things that you can do to help prevent falls. This information is not intended to replace advice given to you by your health care provider. Make sure you discuss any questions you have with your health care provider. Document Released: 03/06/2009 Document Revised: 10/16/2015 Document Reviewed: 06/14/2014 Elsevier Interactive Patient Education  2017 [employer].

## 2021-03-24 NOTE — Progress Notes (Signed)
Subjective:   Jessica Woodward is a 80 y.o. female who presents for Medicare Annual (Subsequent) preventive examination.  Review of Systems     Cardiac Risk Factors include: advanced age (>49men, >65 women);family history of premature cardiovascular disease;hypertension;dyslipidemia     Objective:    Today's Vitals   03/24/21 0923  BP: 102/70  Pulse: 69  Temp: 97.6 F (36.4 C)  TempSrc: Temporal  SpO2: 98%  Weight: 143 lb 9.6 oz (65.1 kg)  Height: 5\' 4"  (1.626 m)  PainSc: 0-No pain   Body mass index is 24.65 kg/m.  Advanced Directives 03/24/2021 04/24/2020 12/15/2019 12/14/2019 12/12/2019 12/11/2019 11/12/2019  Does Patient Have a Medical Advance Directive? Yes Yes - Yes Yes Yes Yes  Type of Advance Directive Living will;Healthcare Power of [place];Living will Healthcare Power of [place];Living will - Healthcare Power of [place];Living will  Does patient want to make changes to medical advance directive? No - Patient declined - No - Patient declined - No - Patient declined - No - Patient declined  Copy of [place] in Chart? No - copy requested - - - - No - copy requested No - copy requested    Current Medications (verified) Outpatient Encounter Medications as of 03/24/2021  Medication Sig   acetaminophen (TYLENOL) 325 MG tablet Take 1-2 tablets (325-650 mg total) by mouth every 4 (four) hours as needed for mild pain.   atorvastatin (LIPITOR) 40 MG tablet TAKE 1 TABLET BY MOUTH  DAILY   cyclobenzaprine (FLEXERIL) 5 MG tablet Take 1 tablet (5 mg total) by mouth 3 (three) times daily as needed for muscle spasms.   irbesartan (AVAPRO) 75 MG tablet Take 1 tablet (75 mg total) by mouth daily.   loratadine (CLARITIN) 10 MG tablet Take 10 mg by mouth daily.   Multiple Vitamin (MULTIVITAMIN) tablet Take 1 tablet by mouth daily.   triamcinolone cream (KENALOG) 0.1 % Apply 1  application topically 4 (four) times daily as needed (rash).    No facility-administered encounter medications on file as of 03/24/2021.    Allergies (verified) Anastrozole, Lisinopril, Sulfasalazine, Sulfonamide derivatives, and Lactose intolerance (gi)   History: Past Medical History:  Diagnosis Date   Arthritis    Breast disorder    cancer   CARCINOMA, SQUAMOUS CELL, HX OF 06/05/2007   DUCTAL CARCINOMA IN SITU, LEFT BREAST 2002   s/p left lumpectomy, xrt and tamoxifen   History of chemotherapy    Hyperlipemia    Hypertension    Personal history of chemotherapy 2002   Personal history of radiation therapy 2002   RADIATION THERAPY, HX OF    Seasonal allergies    Skin cancer    Past Surgical History:  Procedure Laterality Date   ABDOMINAL HYSTERECTOMY     athroscopy right knee  2013   torn miniscus   BREAST BIOPSY Left 2002   BREAST EXCISIONAL BIOPSY Right    BREAST LUMPECTOMY Left 2002   had chemo and radiation   BREAST SURGERY     Left breast lumpectomy with axillary sential node   DILATION AND CURETTAGE OF UTERUS     Left knee surgery     MOHS SURGERY     (R) arm   PACEMAKER IMPLANT N/A 12/11/2019   Procedure: PACEMAKER IMPLANT;  Surgeon: Evans Lance, MD;  Location: [facility];  Service: Cardiovascular;  Laterality: N/A;   right breast lupectomy     TONSILLECTOMY  AND ADENOIDECTOMY     TUBAL LIGATION     Family History  Problem Relation Age of Onset   Coronary artery disease Mother    Dementia Mother    Macular degeneration Father    Coronary artery disease Father    Diabetes Father    Cancer Brother        pancreatic   Heart disease Maternal Grandmother    Kidney disease Maternal Grandmother    Heart disease Maternal Grandfather    Heart disease Paternal Grandmother    Diabetes Paternal Grandmother    Heart disease Paternal Grandfather    Hypertension Son    Heart attack Son    Hypertension Son    Social History   Socioeconomic History    Marital status: Widowed    Spouse name: Not on file   Number of children: 2   Years of education: Not on file   Highest education level: Not on file  Occupational History   Occupation: retired  Tobacco Use   Smoking status: Former    Types: Cigarettes    Quit date: 12/28/1963    Years since quitting: 57.2   Smokeless tobacco: Never  Vaping Use   Vaping Use: Never used  Substance and Sexual Activity   Alcohol use: Not Currently    Comment: rare glass of wine   Drug use: No   Sexual activity: Not Currently    Partners: Male    Birth control/protection: Surgical    Comment: hyst  Other Topics Concern   Not on file  Social History Narrative   HSG, [employer]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 married '60. 2 boys - '62, '73; 11 grandchildren; 3 great-grandchildren. Retired.   Social Determinants of Health   Financial Resource Strain: Low Risk    Difficulty of Paying Living Expenses: Not hard at all  Food Insecurity: No Food Insecurity   Worried About Charity fundraiser in the Last Year: Never true   [place] in the Last Year: Never true  Transportation Needs: No Transportation Needs   Lack of Transportation (Medical): No   Lack of Transportation (Non-Medical): No  Physical Activity: Sufficiently Active   Days of Exercise per Week: 5 days   Minutes of Exercise per Session: 30 min  Stress: No Stress Concern Present   Feeling of Stress : Not at all  Social Connections: Moderately Integrated   Frequency of Communication with Friends and Family: More than three times a week   Frequency of Social Gatherings with Friends and Family: More than three times a week   Attends Religious Services: More than 4 times per year   Active Member of [employer] or Organizations: Yes   Attends Archivist Meetings: More than 4 times per year   Marital Status: Widowed    Tobacco Counseling Counseling given: Not Answered   Clinical Intake:  Pre-visit preparation completed:  Yes  Pain : No/denies pain Pain Score: 0-No pain     BMI - recorded: 24.65 Nutritional Status: BMI of 19-24  Normal Nutritional Risks: None Diabetes: No  How often do you need to have someone help you when you read instructions, pamphlets, or other written materials from your doctor or pharmacy?: 1 - Never What is the last grade level you completed in school?: Associate's degree from [employer]  Diabetic? no  Interpreter Needed?: No  Information entered by :: Lisette Abu, LPN   Activities of Daily Living In your present state of health, do  you have any difficulty performing the following activities: 03/24/2021 01/21/2021  Hearing? Y N  Vision? N N  Difficulty concentrating or making decisions? N N  Walking or climbing stairs? N N  Dressing or bathing? N N  Doing errands, shopping? N N  Preparing Food and eating ? N -  Using the Toilet? N -  In the past six months, have you accidently leaked urine? N -  Do you have problems with loss of bowel control? N -  Managing your Medications? N -  Managing your Finances? N -  Housekeeping or managing your Housekeeping? N -  Some recent data might be hidden    Patient Care Team: Janith Lima, MD as PCP - General (Internal Medicine) Estill Dooms, NP as PCP - OBGYN (Obstetrics and Gynecology) Evans Lance, MD as PCP - Electrophysiology (Cardiology) Magrinat, Virgie Dad, MD (Hematology and Oncology) Sydnee Cabal, MD (Orthopedic Surgery) Rolm Bookbinder, MD (Dermatology) Warden Fillers, MD as Consulting Physician (Ophthalmology) Warden Fillers, MD as Consulting Physician (Ophthalmology)  Indicate any recent Medical Services you may have received from other than [facility] providers in the past year (date may be approximate).     Assessment:   This is a routine wellness examination for Jessica Woodward.  Hearing/Vision screen Hearing Screening - Comments:: Patient has difficulty hearing. No hearing  aids. Vision Screening - Comments:: Patient wears corrective glasses/contacts for reading only.  Eye exam done annually by: Warden Fillers, MD.  Dietary issues and exercise activities discussed: Current Exercise Habits: Home exercise routine, Type of exercise: walking;strength training/weights, Time (Minutes): 30, Frequency (Times/Week): 5, Weekly Exercise (Minutes/Week): 150, Intensity: Moderate, Exercise limited by: None identified   Goals Addressed               This Visit's Progress     Patient Stated (pt-stated)        To maintain my current health status by continuing to eat healthy, stay physically active and socially active.      Depression Screen PHQ 2/9 Scores 03/24/2021 01/21/2021 11/12/2019 06/11/2019 10/10/2018 10/05/2017 07/15/2017  PHQ - 2 Score 0 0 0 0 0 0 0    Fall Risk Fall Risk  03/24/2021 01/21/2021 11/12/2019 06/12/2019 06/11/2019  Falls in the past year? 0 0 0 0 0  Comment - - - - -  Number falls in past yr: 0 - 0 - 0  Injury with Fall? 0 - 0 - 0  Risk for fall due to : No Fall Risks - No Fall Risks - -  Follow up Falls evaluation completed - Falls evaluation completed - Falls evaluation completed    FALL RISK PREVENTION PERTAINING TO THE HOME:  Any stairs in or around the home? Yes  If so, are there any without handrails? No  Home free of loose throw rugs in walkways, pet beds, electrical cords, etc? Yes  Adequate lighting in your home to reduce risk of falls? Yes   ASSISTIVE DEVICES UTILIZED TO PREVENT FALLS:  Life alert? No  Use of a cane, walker or w/c? No  Grab bars in the bathroom? Yes  Shower chair or bench in shower? Yes  Elevated toilet seat or a handicapped toilet? No   TIMED UP AND GO:  Was the test performed? Yes .  Length of time to ambulate 10 feet: 6 sec.   Gait steady and fast without use of assistive device  Cognitive Function: Normal cognitive status assessed by direct observation by this Nurse Health Advisor. No abnormalities  found.  MMSE - Mini Mental State Exam 12/25/2015  Not completed: (No Data)     6CIT Screen 11/12/2019  What Year? 0 points  What month? 0 points  What time? 0 points  Count back from 20 0 points  Months in reverse 0 points  Repeat phrase 0 points  Total Score 0    Immunizations Immunization History  Administered Date(s) Administered   Fluad Quad(high Dose 65+) 02/14/2019, 01/21/2021   Influenza Whole 03/17/2009, 02/28/2012   Influenza, High Dose Seasonal PF 02/17/2015, 02/12/2016, 02/01/2017, 01/31/2018   Influenza,inj,Quad PF,6+ Mos 02/27/2013, 03/20/2014   Influenza-Unspecified 02/17/2015   PFIZER(Purple Top)SARS-COV-2 Vaccination 06/07/2019, 06/26/2019, 02/07/2020, 09/04/2020, 03/13/2021   Pneumococcal Conjugate-13 03/28/2013   Pneumococcal Polysaccharide-23 08/23/2004, 01/07/2010, 04/15/2015   Td 07/08/1998, 01/07/2010   Tdap 03/06/2020   Zoster Recombinat (Shingrix) 11/05/2020, 02/27/2021   Zoster, Live 03/14/2006    TDAP status: Up to date  Flu Vaccine status: Up to date  Pneumococcal vaccine status: Up to date  Covid-19 vaccine status: Completed vaccines  Qualifies for Shingles Vaccine? Yes   Zostavax completed Yes   Shingrix Completed?: Yes  Screening Tests Health Maintenance  Topic Date Due   COVID-19 Vaccine (6 - Booster for Pfizer series) 05/08/2021   TETANUS/TDAP  03/06/2030   Pneumonia Vaccine 62+ Years old  Completed   INFLUENZA VACCINE  Completed   DEXA SCAN  Completed   Hepatitis C Screening  Completed   Zoster Vaccines- Shingrix  Completed   HPV VACCINES  Aged Out    Health Maintenance  There are no preventive care reminders to display for this patient.   Colorectal cancer screening: No longer required.   Mammogram status: Completed 08/25/2020. Repeat every year  Bone Density status: Completed 01/31/2018. Results reflect: Bone density results: NORMAL. Repeat every 5 years.  Lung Cancer Screening: (Low Dose CT Chest recommended if Age  29-80 years, 30 pack-year currently smoking OR have quit w/in 15years.) does not qualify.   Lung Cancer Screening Referral: no  Additional Screening:  Hepatitis C Screening: does qualify; Completed yes  Vision Screening: Recommended annual ophthalmology exams for early detection of glaucoma and other disorders of the eye. Is the patient up to date with their annual eye exam?  Yes  Who is the provider or what is the name of the office in which the patient attends annual eye exams? Warden Fillers, MD. If pt is not established with a provider, would they like to be referred to a provider to establish care? No .   Dental Screening: Recommended annual dental exams for proper oral hygiene  Community Resource Referral / Chronic Care Management: CRR required this visit?   no  CCM required this visit?  No      Plan:     I have personally reviewed and noted the following in the patient's chart:   Medical and social history Use of alcohol, tobacco or illicit drugs  Current medications and supplements including opioid prescriptions.  Functional ability and status Nutritional status Physical activity Advanced directives List of other physicians Hospitalizations, surgeries, and ER visits in previous 12 months Vitals Screenings to include cognitive, depression, and falls Referrals and appointments  In addition, I have reviewed and discussed with patient certain preventive protocols, quality metrics, and best practice recommendations. A written personalized care plan for preventive services as well as general preventive health recommendations were provided to patient.     Sheral Flow, LPN   [id number]/12/[id number]   Nurse Notes:  Hearing Screening - Comments:: Patient has difficulty  hearing. No hearing aids. Vision Screening - Comments:: Patient wears corrective glasses/contacts for reading only.  Eye exam done annually by: Warden Fillers, MD.

## 2021-03-31 DIAGNOSIS — H838X3 Other specified diseases of inner ear, bilateral: Secondary | ICD-10-CM | POA: Diagnosis not present

## 2021-03-31 DIAGNOSIS — H903 Sensorineural hearing loss, bilateral: Secondary | ICD-10-CM | POA: Diagnosis not present

## 2021-03-31 DIAGNOSIS — H6123 Impacted cerumen, bilateral: Secondary | ICD-10-CM | POA: Diagnosis not present

## 2021-04-22 DIAGNOSIS — L814 Other melanin hyperpigmentation: Secondary | ICD-10-CM | POA: Diagnosis not present

## 2021-04-22 DIAGNOSIS — Z79899 Other long term (current) drug therapy: Secondary | ICD-10-CM | POA: Diagnosis not present

## 2021-04-22 DIAGNOSIS — L649 Androgenic alopecia, unspecified: Secondary | ICD-10-CM | POA: Diagnosis not present

## 2021-04-22 DIAGNOSIS — Z85828 Personal history of other malignant neoplasm of skin: Secondary | ICD-10-CM | POA: Diagnosis not present

## 2021-04-22 DIAGNOSIS — D225 Melanocytic nevi of trunk: Secondary | ICD-10-CM | POA: Diagnosis not present

## 2021-04-22 DIAGNOSIS — D1801 Hemangioma of skin and subcutaneous tissue: Secondary | ICD-10-CM | POA: Diagnosis not present

## 2021-04-22 DIAGNOSIS — L821 Other seborrheic keratosis: Secondary | ICD-10-CM | POA: Diagnosis not present

## 2021-04-28 ENCOUNTER — Ambulatory Visit (INDEPENDENT_AMBULATORY_CARE_PROVIDER_SITE_OTHER): Payer: Medicare Other | Admitting: Internal Medicine

## 2021-04-28 ENCOUNTER — Other Ambulatory Visit: Payer: Self-pay

## 2021-04-28 ENCOUNTER — Encounter: Payer: Self-pay | Admitting: Internal Medicine

## 2021-04-28 VITALS — BP 120/70 | HR 69 | Ht 64.0 in | Wt 142.0 lb

## 2021-04-28 DIAGNOSIS — I442 Atrioventricular block, complete: Secondary | ICD-10-CM | POA: Diagnosis not present

## 2021-04-28 NOTE — Patient Instructions (Signed)
Medication Instructions:  Your physician recommends that you continue on your current medications as directed. Please refer to the Current Medication list given to you today.  *If you need a refill on your cardiac medications before your next appointment, please call your pharmacy*   Lab Work: NONE   If you have labs (blood work) drawn today and your tests are completely normal, you will receive your results only by: . MyChart Message (if you have MyChart) OR . A paper copy in the mail If you have any lab test that is abnormal or we need to change your treatment, we will call you to review the results.   Testing/Procedures: NONE    Follow-Up: At [facility], you and your health needs are our priority.  As part of our continuing mission to provide you with exceptional heart care, we have created designated Provider Care Teams.  These Care Teams include your primary Cardiologist (physician) and Advanced Practice Providers (APPs -  Physician Assistants and Nurse Practitioners) who all work together to provide you with the care you need, when you need it.  We recommend signing up for the patient portal called "MyChart".  Sign up information is provided on this After Visit Summary.  MyChart is used to connect with patients for Virtual Visits (Telemedicine).  Patients are able to view lab/test results, encounter notes, upcoming appointments, etc.  Non-urgent messages can be sent to your provider as well.   To learn more about what you can do with MyChart, go to [email or web address].    Your next appointment:   1 year(s)  The format for your next appointment:   In Person  Provider:   Gregg Taylor, MD   Other Instructions Thank you for choosing [facility]!    

## 2021-04-28 NOTE — Progress Notes (Addendum)
HPI Jessica Woodward returns today for followup. She is a pleasant 80 yo woman with CHB, s/p PPM insertion. She has a h/o right sided chest pain when I saw her last year but none recently. She has not had syncope. No edema. She denies any difficulty with exertion. She is able to walk up her stairs and outside without limit.  Allergies  Allergen Reactions   Anastrozole Other (See Comments) and Rash    Causes muscle weakness   Lisinopril     cough   Sulfasalazine Hives   Sulfonamide Derivatives Hives   Lactose Intolerance (Gi) Diarrhea     Current Outpatient Medications  Medication Sig Dispense Refill   acetaminophen (TYLENOL) 325 MG tablet Take 1-2 tablets (325-650 mg total) by mouth every 4 (four) hours as needed for mild pain.     atorvastatin (LIPITOR) 40 MG tablet TAKE 1 TABLET BY MOUTH  DAILY 90 tablet 1   cyclobenzaprine (FLEXERIL) 5 MG tablet Take 1 tablet (5 mg total) by mouth 3 (three) times daily as needed for muscle spasms. 90 tablet 2   irbesartan (AVAPRO) 75 MG tablet Take 1 tablet (75 mg total) by mouth daily. 90 tablet 1   loratadine (CLARITIN) 10 MG tablet Take 10 mg by mouth daily.     Multiple Vitamin (MULTIVITAMIN) tablet Take 1 tablet by mouth daily.     triamcinolone cream (KENALOG) 0.1 % Apply 1 application topically 4 (four) times daily as needed (rash).      No current facility-administered medications for this visit.     Past Medical History:  Diagnosis Date   Arthritis    Breast disorder    cancer   CARCINOMA, SQUAMOUS CELL, HX OF 06/05/2007   DUCTAL CARCINOMA IN SITU, LEFT BREAST 2002   s/p left lumpectomy, xrt and tamoxifen   History of chemotherapy    Hyperlipemia    Hypertension    Personal history of chemotherapy 2002   Personal history of radiation therapy 2002   RADIATION THERAPY, HX OF    Seasonal allergies    Skin cancer     ROS:   All systems reviewed and negative except as noted in the HPI.   Past Surgical History:  Procedure  Laterality Date   ABDOMINAL HYSTERECTOMY     athroscopy right knee  2013   torn miniscus   BREAST BIOPSY Left 2002   BREAST EXCISIONAL BIOPSY Right    BREAST LUMPECTOMY Left 2002   had chemo and radiation   BREAST SURGERY     Left breast lumpectomy with axillary sential node   DILATION AND CURETTAGE OF UTERUS     Left knee surgery     MOHS SURGERY     (R) arm   PACEMAKER IMPLANT N/A 12/11/2019   Procedure: PACEMAKER IMPLANT;  Surgeon: Evans Lance, MD;  Location: [facility];  Service: Cardiovascular;  Laterality: N/A;   right breast lupectomy     TONSILLECTOMY AND ADENOIDECTOMY     TUBAL LIGATION       Family History  Problem Relation Age of Onset   Coronary artery disease Mother    Dementia Mother    Macular degeneration Father    Coronary artery disease Father    Diabetes Father    Cancer Brother        pancreatic   Heart disease Maternal Grandmother    Kidney disease Maternal Grandmother    Heart disease Maternal Grandfather    Heart disease Paternal Grandmother  Diabetes Paternal Grandmother    Heart disease Paternal Grandfather    Hypertension Son    Heart attack Son    Hypertension Son      Social History   Socioeconomic History   Marital status: Widowed    Spouse name: Not on file   Number of children: 2   Years of education: Not on file   Highest education level: Not on file  Occupational History   Occupation: retired  Tobacco Use   Smoking status: Former    Types: Cigarettes    Quit date: 12/28/1963    Years since quitting: 57.3   Smokeless tobacco: Never  Vaping Use   Vaping Use: Never used  Substance and Sexual Activity   Alcohol use: Not Currently    Comment: rare glass of wine   Drug use: No   Sexual activity: Not Currently    Partners: Male    Birth control/protection: Surgical    Comment: hyst  Other Topics Concern   Not on file  Social History Narrative   HSG, [employer]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 married '60. 2  boys - '62, '73; 11 grandchildren; 3 great-grandchildren. Retired.   Social Determinants of Health   Financial Resource Strain: Low Risk    Difficulty of Paying Living Expenses: Not hard at all  Food Insecurity: No Food Insecurity   Worried About Charity fundraiser in the Last Year: Never true   [place] in the Last Year: Never true  Transportation Needs: No Transportation Needs   Lack of Transportation (Medical): No   Lack of Transportation (Non-Medical): No  Physical Activity: Sufficiently Active   Days of Exercise per Week: 5 days   Minutes of Exercise per Session: 30 min  Stress: No Stress Concern Present   Feeling of Stress : Not at all  Social Connections: Moderately Integrated   Frequency of Communication with Friends and Family: More than three times a week   Frequency of Social Gatherings with Friends and Family: More than three times a week   Attends Religious Services: More than 4 times per year   Active Member of [employer] or Organizations: Yes   Attends Archivist Meetings: More than 4 times per year   Marital Status: Widowed  Human resources officer Violence: Not At Risk   Fear of Current or Ex-Partner: No   Emotionally Abused: No   Physically Abused: No   Sexually Abused: No     BP 120/70   Pulse 69   Ht 5\' 4"  (1.626 m)   Wt 142 lb (64.4 kg)   SpO2 98%   BMI 24.37 kg/m   Physical Exam:  Well appearing NAD HEENT: Unremarkable Neck:  No JVD, no thyromegally Lymphatics:  No adenopathy Back:  No CVA tenderness Lungs:  Clear with no wheezes HEART:  Regular rate rhythm, no murmurs, no rubs, no clicks Abd:  soft, positive bowel sounds, no organomegally, no rebound, no guarding Ext:  2 plus pulses, no edema, no cyanosis, no clubbing Skin:  No rashes no nodules Neuro:  CN II through XII intact, motor grossly intact  EKG - nsr with ventricular pacing  DEVICE  Normal device function.  See PaceArt for details.   Assess/Plan:  CHB - she has no  escape today. She is asymptomatic s/p PPM insertion. PPM - her medtronic DDD PM is working normally. We will recheck in several months. HTN - her bp is well controlled. No change in her meds. Dyslipidemia - she  will continue her lipitor 40 mg daily  Carleene Overlie Hanya Guerin,MD

## 2021-04-29 ENCOUNTER — Encounter: Payer: Self-pay | Admitting: Internal Medicine

## 2021-04-30 ENCOUNTER — Telehealth: Payer: Self-pay | Admitting: Internal Medicine

## 2021-04-30 NOTE — Telephone Encounter (Signed)
I have amended the note. She had been bothered by this last year but I did not amend the note to reflect that it was not bothering her presently. My apologies and thank you for the correction. GT

## 2021-04-30 NOTE — Telephone Encounter (Signed)
Pt would like to have that she is having non-exertional right side pain taken out of her note from 04/28/21

## 2021-04-30 NOTE — Telephone Encounter (Signed)
New Message:     Patient would like for you to call her. Patient said she is very upset about what was written in her Progress notes from yesterday visit(04-29-21) with Dr Lovena Le.

## 2021-05-02 ENCOUNTER — Encounter: Payer: Self-pay | Admitting: Internal Medicine

## 2021-05-05 ENCOUNTER — Other Ambulatory Visit: Payer: Self-pay

## 2021-05-05 ENCOUNTER — Ambulatory Visit (INDEPENDENT_AMBULATORY_CARE_PROVIDER_SITE_OTHER): Payer: Medicare Other

## 2021-05-05 ENCOUNTER — Encounter: Payer: Self-pay | Admitting: Internal Medicine

## 2021-05-05 ENCOUNTER — Ambulatory Visit (INDEPENDENT_AMBULATORY_CARE_PROVIDER_SITE_OTHER): Payer: Medicare Other | Admitting: Internal Medicine

## 2021-05-05 VITALS — BP 136/78 | HR 96 | Temp 97.6°F | Resp 16 | Ht 64.0 in | Wt 146.0 lb

## 2021-05-05 DIAGNOSIS — R052 Subacute cough: Secondary | ICD-10-CM

## 2021-05-05 DIAGNOSIS — J208 Acute bronchitis due to other specified organisms: Secondary | ICD-10-CM

## 2021-05-05 DIAGNOSIS — I10 Essential (primary) hypertension: Secondary | ICD-10-CM

## 2021-05-05 DIAGNOSIS — J370 Chronic laryngitis: Secondary | ICD-10-CM | POA: Insufficient documentation

## 2021-05-05 DIAGNOSIS — R059 Cough, unspecified: Secondary | ICD-10-CM | POA: Diagnosis not present

## 2021-05-05 DIAGNOSIS — U071 COVID-19: Secondary | ICD-10-CM | POA: Diagnosis not present

## 2021-05-05 LAB — BASIC METABOLIC PANEL
BUN: 17 mg/dL (ref 6–23)
CO2: 26 mEq/L (ref 19–32)
Calcium: 10.2 mg/dL (ref 8.4–10.5)
Chloride: 104 mEq/L (ref 96–112)
Creatinine, Ser: 0.82 mg/dL (ref 0.40–1.20)
GFR: 67.82 mL/min (ref >60.00)
Glucose, Bld: 85 mg/dL (ref 70–99)
Potassium: 4.4 mEq/L (ref 3.5–5.1)
Sodium: 137 mEq/L (ref 135–145)

## 2021-05-05 LAB — CBC WITH DIFFERENTIAL/PLATELET
Basophils Absolute: 0 10*3/uL (ref 0.0–0.1)
Basophils Relative: 0.6 % (ref 0.0–3.0)
Eosinophils Absolute: 0.1 10*3/uL (ref 0.0–0.7)
Eosinophils Relative: 2 % (ref 0.0–5.0)
HCT: 38.4 % (ref 36.0–46.0)
Hemoglobin: 12.7 g/dL (ref 12.0–15.0)
Lymphocytes Relative: 31 % (ref 12.0–46.0)
Lymphs Abs: 2.3 10*3/uL (ref 0.7–4.0)
MCHC: 33.2 g/dL (ref 30.0–36.0)
MCV: 92.7 fl (ref 78.0–100.0)
Monocytes Absolute: 0.6 10*3/uL (ref 0.1–1.0)
Monocytes Relative: 8 % (ref 3.0–12.0)
Neutro Abs: 4.3 10*3/uL (ref 1.4–7.7)
Neutrophils Relative %: 58.4 % (ref 43.0–77.0)
Platelets: 284 10*3/uL (ref 150.0–400.0)
RBC: 4.14 Mil/uL (ref 3.87–5.11)
RDW: 13.7 % (ref 11.5–15.5)
WBC: 7.4 10*3/uL (ref 4.0–10.5)

## 2021-05-05 LAB — SARS-COV-2 IGG: SARS-COV-2 IgG: 48.27

## 2021-05-05 MED ORDER — HYDROCODONE BIT-HOMATROP MBR 5-1.5 MG/5ML PO SOLN
5.0000 mL | Freq: Four times a day (QID) | ORAL | 0 refills | Status: AC | PRN
Start: 2021-05-05 — End: 2021-05-12

## 2021-05-05 NOTE — Progress Notes (Signed)
Subjective:  Patient ID: Jessica Woodward, female    DOB: [birth year]  Age: 80 y.o. MRN: [id number]  CC: Cough  This visit occurred during the SARS-CoV-2 public health emergency.  Safety protocols were in place, including screening questions prior to the visit, additional usage of staff PPE, and extensive cleaning of exam room while observing appropriate contact time as indicated for disinfecting solutions.    HPI JUANITA STREIGHT presents for f/up -  She complains of a 6-week history of nonproductive cough and laryngitis.  She recently saw an ENT doctor for hearing loss but she did not tell him about the cough or laryngitis.  Outpatient Medications Prior to Visit  Medication Sig Dispense Refill   acetaminophen (TYLENOL) 325 MG tablet Take 1-2 tablets (325-650 mg total) by mouth every 4 (four) hours as needed for mild pain.     atorvastatin (LIPITOR) 40 MG tablet TAKE 1 TABLET BY MOUTH  DAILY 90 tablet 1   cyclobenzaprine (FLEXERIL) 5 MG tablet Take 1 tablet (5 mg total) by mouth 3 (three) times daily as needed for muscle spasms. 90 tablet 2   irbesartan (AVAPRO) 75 MG tablet Take 1 tablet (75 mg total) by mouth daily. 90 tablet 1   loratadine (CLARITIN) 10 MG tablet Take 10 mg by mouth daily.     Multiple Vitamin (MULTIVITAMIN) tablet Take 1 tablet by mouth daily.     triamcinolone cream (KENALOG) 0.1 % Apply 1 application topically 4 (four) times daily as needed (rash).      No facility-administered medications prior to visit.    ROS Review of Systems  Constitutional:  Negative for chills, fatigue and fever.  HENT:  Positive for voice change. Negative for sinus pressure, sore throat and trouble swallowing.   Respiratory:  Positive for cough. Negative for chest tightness, shortness of breath and wheezing.   Cardiovascular:  Negative for chest pain, palpitations and leg swelling.  Gastrointestinal:  Negative for abdominal pain, diarrhea and nausea.  Genitourinary: Negative.  Negative for  difficulty urinating.  Musculoskeletal: Negative.   Skin: Negative.   Neurological:  Negative for dizziness, weakness and light-headedness.  Hematological:  Negative for adenopathy. Does not bruise/bleed easily.  Psychiatric/Behavioral: Negative.     Objective:  BP 136/78 (BP Location: Right Arm, Patient Position: Sitting, Cuff Size: Large)    Pulse 96    Temp 97.6 F (36.4 C) (Oral)    Resp 16    Ht 5\' 4"  (1.626 m)    Wt 146 lb (66.2 kg)    SpO2 97%    BMI 25.06 kg/m   BP Readings from Last 3 Encounters:  05/05/21 136/78  04/28/21 120/70  03/24/21 102/70    Wt Readings from Last 3 Encounters:  05/05/21 146 lb (66.2 kg)  04/28/21 142 lb (64.4 kg)  03/24/21 143 lb 9.6 oz (65.1 kg)    Physical Exam Vitals reviewed.  HENT:     Nose: Nose normal.     Mouth/Throat:     Mouth: Mucous membranes are moist.  Eyes:     General: No scleral icterus.    Conjunctiva/sclera: Conjunctivae normal.  Cardiovascular:     Rate and Rhythm: Normal rate and regular rhythm.     Heart sounds: No murmur heard. Pulmonary:     Effort: Pulmonary effort is normal.     Breath sounds: No stridor. No wheezing, rhonchi or rales.  Abdominal:     General: Abdomen is flat. Bowel sounds are normal. There is no distension.  Palpations: Abdomen is soft. There is no hepatomegaly, splenomegaly or mass.     Tenderness: There is no abdominal tenderness. There is no guarding.  Musculoskeletal:        General: Normal range of motion.     Cervical back: Neck supple. No tenderness.     Right lower leg: No edema.     Left lower leg: No edema.  Lymphadenopathy:     Cervical: No cervical adenopathy.  Skin:    General: Skin is warm and dry.  Neurological:     General: No focal deficit present.     Mental Status: She is alert.    Lab Results  Component Value Date   WBC 7.4 05/05/2021   HGB 12.7 05/05/2021   HCT 38.4 05/05/2021   PLT 284.0 05/05/2021   GLUCOSE 85 05/05/2021   CHOL 160 01/21/2021   TRIG  77.0 01/21/2021   HDL 65.20 01/21/2021   LDLDIRECT 166.9 03/23/2013   LDLCALC 79 01/21/2021   ALT 15 01/21/2021   AST 21 01/21/2021   NA 137 05/05/2021   K 4.4 05/05/2021   CL 104 05/05/2021   CREATININE 0.82 05/05/2021   BUN 17 05/05/2021   CO2 26 05/05/2021   TSH 3.92 01/21/2021   HGBA1C 5.4 01/17/2012    MM 3D SCREEN BREAST BILATERAL  Result Date: 08/26/2020 CLINICAL DATA:  Screening. EXAM: DIGITAL SCREENING BILATERAL MAMMOGRAM WITH TOMOSYNTHESIS AND CAD TECHNIQUE: Bilateral screening digital craniocaudal and mediolateral oblique mammograms were obtained. Bilateral screening digital breast tomosynthesis was performed. The images were evaluated with computer-aided detection. COMPARISON:  Previous exam(s). ACR Breast Density Category b: There are scattered areas of fibroglandular density. FINDINGS: There are no findings suspicious for malignancy. The images were evaluated with computer-aided detection. IMPRESSION: No mammographic evidence of malignancy. A result letter of this screening mammogram will be mailed directly to the patient. RECOMMENDATION: Screening mammogram in one year. (Code:SM-B-01Y) BI-RADS CATEGORY  1: Negative. Electronically Signed   By: Kristopher Oppenheim M.D.   On: 08/26/2020 09:04   DG Chest 2 View  Result Date: 05/06/2021 CLINICAL DATA:  80 year old female with cough for 6 weeks EXAM: CHEST - 2 VIEW COMPARISON:  04/24/2020 FINDINGS: Cardiomediastinal silhouette unchanged in size and contour. No evidence of central vascular congestion. No interlobular septal thickening. Cardiac pacing device on the left chest wall unchanged. No pneumothorax or pleural effusion. Coarsened interstitial markings, with no confluent airspace disease. No acute displaced fracture. Degenerative changes of the spine. Pectus deformity again noted. Surgical changes of the left chest again noted IMPRESSION: No active cardiopulmonary disease. Electronically Signed   By: Corrie Mckusick D.O.   On:  05/06/2021 14:33     Assessment & Plan:   Ebonee was seen today for cough.  Diagnoses and all orders for this visit:  Essential hypertension, benign- Her blood pressure is adequately well controlled. -     CBC with Differential/Platelet; Future -     Basic metabolic panel; Future -     Basic metabolic panel -     CBC with Differential/Platelet  Subacute cough- Her chest x-ray is negative for mass or infiltrate.  Her COVID antibody is positive.  Will offer symptom relief. -     CBC with Differential/Platelet; Future -     SARS-COV-2 IgG; Future -     DG Chest 2 View; Future -     SARS-COV-2 IgG -     CBC with Differential/Platelet -     HYDROcodone bit-homatropine (HYCODAN) 5-1.5 MG/5ML syrup; Take 5  mLs by mouth every 6 (six) hours as needed for up to 7 days for cough.  Chronic laryngitis -     CBC with Differential/Platelet; Future -     Ambulatory referral to ENT -     CBC with Differential/Platelet  Acute bronchitis due to COVID-19 virus -     HYDROcodone bit-homatropine (HYCODAN) 5-1.5 MG/5ML syrup; Take 5 mLs by mouth every 6 (six) hours as needed for up to 7 days for cough.  I am having Evelene Roussin. Gill "Sandy" start on HYDROcodone bit-homatropine. I am also having her maintain her triamcinolone cream, multivitamin, loratadine, cyclobenzaprine, acetaminophen, atorvastatin, and irbesartan.  Meds ordered this encounter  Medications   HYDROcodone bit-homatropine (HYCODAN) 5-1.5 MG/5ML syrup    Sig: Take 5 mLs by mouth every 6 (six) hours as needed for up to 7 days for cough.    Dispense:  120 mL    Refill:  0      Follow-up: Return in about 3 months (around 08/03/2021).  Scarlette Calico, MD

## 2021-05-05 NOTE — Patient Instructions (Signed)

## 2021-05-20 ENCOUNTER — Ambulatory Visit: Payer: Medicare Other | Admitting: Internal Medicine

## 2021-05-22 DIAGNOSIS — L639 Alopecia areata, unspecified: Secondary | ICD-10-CM | POA: Diagnosis not present

## 2021-05-22 DIAGNOSIS — L649 Androgenic alopecia, unspecified: Secondary | ICD-10-CM | POA: Diagnosis not present

## 2021-06-01 NOTE — Progress Notes (Signed)
Subjective:    Patient ID: Jessica Woodward, female    DOB: 11-Mar-1941, 81 y.o.   MRN: [id number]  This visit occurred during the SARS-CoV-2 public health emergency.  Safety protocols were in place, including screening questions prior to the visit, additional usage of staff PPE, and extensive cleaning of exam room while observing appropriate contact time as indicated for disinfecting solutions.    HPI The patient is here for an acute visit.   She is here to discuss thyroid results from Dr Ubaldo Glassing.  Her TSH was slightly elevated.  Dr. Ubaldo Glassing checked her TSH because she mentioned that her hair was falling out.  Mom has hypothyroidism.    She feels tired, fatigued and her hair is falling.  She noted about 3-4 months ago.     Medications and allergies reviewed with patient and updated if appropriate.  Patient Active Problem List   Diagnosis Date Noted   Chronic laryngitis 05/05/2021   Subacute cough 05/05/2021   Acute bronchitis due to COVID-19 virus 05/05/2021   Lobular carcinoma in situ (LCIS) of breast 01/21/2021   Atherosclerosis of aorta ([place]) 01/21/2021   Heart block 12/11/2019   DDD (degenerative disc disease), cervical 05/10/2019   Seasonal allergic rhinitis due to pollen 02/15/2017   Bilateral sensorineural hearing loss 12/27/2016   Essential hypertension, benign 09/11/2013   Dyslipidemia, goal LDL below 130 01/17/2012   DUCTAL CARCINOMA IN SITU, LEFT BREAST 06/05/2007    Current Outpatient Medications on File Prior to Visit  Medication Sig Dispense Refill   acetaminophen (TYLENOL) 325 MG tablet Take 1-2 tablets (325-650 mg total) by mouth every 4 (four) hours as needed for mild pain.     atorvastatin (LIPITOR) 40 MG tablet TAKE 1 TABLET BY MOUTH  DAILY 90 tablet 1   cyclobenzaprine (FLEXERIL) 5 MG tablet Take 1 tablet (5 mg total) by mouth 3 (three) times daily as needed for muscle spasms. 90 tablet 2   irbesartan (AVAPRO) 75 MG tablet Take 1 tablet (75 mg total) by  mouth daily. 90 tablet 1   loratadine (CLARITIN) 10 MG tablet Take 10 mg by mouth daily.     Multiple Vitamin (MULTIVITAMIN) tablet Take 1 tablet by mouth daily.     triamcinolone cream (KENALOG) 0.1 % Apply 1 application topically 4 (four) times daily as needed (rash).      No current facility-administered medications on file prior to visit.    Past Medical History:  Diagnosis Date   Arthritis    Breast disorder    cancer   CARCINOMA, SQUAMOUS CELL, HX OF 06/05/2007   DUCTAL CARCINOMA IN SITU, LEFT BREAST 2002   s/p left lumpectomy, xrt and tamoxifen   History of chemotherapy    Hyperlipemia    Hypertension    Personal history of chemotherapy 2002   Personal history of radiation therapy 2002   RADIATION THERAPY, HX OF    Seasonal allergies    Skin cancer     Past Surgical History:  Procedure Laterality Date   ABDOMINAL HYSTERECTOMY     athroscopy right knee  2013   torn miniscus   BREAST BIOPSY Left 2002   BREAST EXCISIONAL BIOPSY Right    BREAST LUMPECTOMY Left 2002   had chemo and radiation   BREAST SURGERY     Left breast lumpectomy with axillary sential node   DILATION AND CURETTAGE OF UTERUS     Left knee surgery     MOHS SURGERY     (R) arm  PACEMAKER IMPLANT N/A 12/11/2019   Procedure: PACEMAKER IMPLANT;  Surgeon: Evans Lance, MD;  Location: [facility];  Service: Cardiovascular;  Laterality: N/A;   right breast lupectomy     TONSILLECTOMY AND ADENOIDECTOMY     TUBAL LIGATION      Social History   Socioeconomic History   Marital status: Widowed    Spouse name: Not on file   Number of children: 2   Years of education: Not on file   Highest education level: Not on file  Occupational History   Occupation: retired  Tobacco Use   Smoking status: Former    Types: Cigarettes    Quit date: 12/28/1963    Years since quitting: 57.4   Smokeless tobacco: Never  Vaping Use   Vaping Use: Never used  Substance and Sexual Activity   Alcohol use: Not  Currently    Comment: rare glass of wine   Drug use: No   Sexual activity: Not Currently    Partners: Male    Birth control/protection: Surgical    Comment: hyst  Other Topics Concern   Not on file  Social History Narrative   HSG, [employer]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 married '60. 2 boys - '62, '73; 11 grandchildren; 3 great-grandchildren. Retired.   Social Determinants of Health   Financial Resource Strain: Low Risk    Difficulty of Paying Living Expenses: Not hard at all  Food Insecurity: No Food Insecurity   Worried About Charity fundraiser in the Last Year: Never true   [place] in the Last Year: Never true  Transportation Needs: No Transportation Needs   Lack of Transportation (Medical): No   Lack of Transportation (Non-Medical): No  Physical Activity: Sufficiently Active   Days of Exercise per Week: 5 days   Minutes of Exercise per Session: 30 min  Stress: No Stress Concern Present   Feeling of Stress : Not at all  Social Connections: Moderately Integrated   Frequency of Communication with Friends and Family: More than three times a week   Frequency of Social Gatherings with Friends and Family: More than three times a week   Attends Religious Services: More than 4 times per year   Active Member of [employer] or Organizations: Yes   Attends Archivist Meetings: More than 4 times per year   Marital Status: Widowed    Family History  Problem Relation Age of Onset   Coronary artery disease Mother    Dementia Mother    Macular degeneration Father    Coronary artery disease Father    Diabetes Father    Cancer Brother        pancreatic   Heart disease Maternal Grandmother    Kidney disease Maternal Grandmother    Heart disease Maternal Grandfather    Heart disease Paternal Grandmother    Diabetes Paternal Grandmother    Heart disease Paternal Grandfather    Hypertension Son    Heart attack Son    Hypertension Son     Review of Systems   Constitutional:  Positive for fatigue.  Cardiovascular:  Negative for chest pain, palpitations and leg swelling.  Endocrine: Positive for cold intolerance.  Skin:        Hair loss  Neurological:  Negative for light-headedness and headaches.      Objective:   Vitals:   06/02/21 0904  BP: 122/68  Pulse: 83  Temp: 98.7 F (37.1 C)  SpO2: 98%   BP Readings  from Last 3 Encounters:  06/02/21 122/68  05/05/21 136/78  04/28/21 120/70   Wt Readings from Last 3 Encounters:  06/02/21 146 lb (66.2 kg)  05/05/21 146 lb (66.2 kg)  04/28/21 142 lb (64.4 kg)   Body mass index is 25.06 kg/m.   Physical Exam    Constitutional: Appears well-developed and well-nourished. No distress.  Head: Normocephalic and atraumatic.  Neck: Neck supple. No tracheal deviation present. No thyromegaly present.  No cervical lymphadenopathy Cardiovascular: Normal rate, regular rhythm and normal heart sounds.  No murmur heard. No carotid bruit .  No edema Pulmonary/Chest: Effort normal and breath sounds normal. No respiratory distress. No has no wheezes. No rales.  Skin: Skin is warm and dry. Not diaphoretic.  No focal hair loss Psychiatric: Normal mood and affect. Behavior is normal.    Blood work results from dermatology reviewed-to be scanned into her chart   Assessment & Plan:    Elevated TSH: Acute Had blood work done recently by dermatology and her TSH was just above normal She is having symptoms consistent with underactive thyroid-fatigue, cold intolerance and hair loss Has family history of hypothyroidism-her mother Will check TSH, free T3, free T4-if in underactive state will start medication, but may just need to monitor versus monitoring  Cold intolerance, fatigue, hair loss: Symptoms possibly related to underactive thyroid-we will recheck TFTs She feels the symptoms are different and new for her Hair loss is diffuse and started 3-4 months ago-no other obvious causes Fatigue and cold  intolerance also unusual for her We will see what TFTs show  Hypertension: Chronic BP well controlled Continue irbesartan 75 mg daily   Has follow-up with PCP in 2 months

## 2021-06-02 ENCOUNTER — Other Ambulatory Visit: Payer: Self-pay

## 2021-06-02 ENCOUNTER — Ambulatory Visit (INDEPENDENT_AMBULATORY_CARE_PROVIDER_SITE_OTHER): Payer: Medicare Other | Admitting: Internal Medicine

## 2021-06-02 ENCOUNTER — Encounter: Payer: Self-pay | Admitting: Internal Medicine

## 2021-06-02 VITALS — BP 122/68 | HR 83 | Temp 98.7°F | Ht 64.0 in | Wt 146.0 lb

## 2021-06-02 DIAGNOSIS — R7989 Other specified abnormal findings of blood chemistry: Secondary | ICD-10-CM

## 2021-06-02 DIAGNOSIS — L679 Hair color and hair shaft abnormality, unspecified: Secondary | ICD-10-CM

## 2021-06-02 DIAGNOSIS — L659 Nonscarring hair loss, unspecified: Secondary | ICD-10-CM

## 2021-06-02 DIAGNOSIS — R6889 Other general symptoms and signs: Secondary | ICD-10-CM | POA: Diagnosis not present

## 2021-06-02 LAB — T3, FREE: T3, Free: 2.7 pg/mL (ref 2.3–4.2)

## 2021-06-02 LAB — TSH: TSH: 4.75 u[IU]/mL (ref 0.35–5.50)

## 2021-06-02 LAB — T4, FREE: Free T4: 0.9 ng/dL (ref 0.60–1.60)

## 2021-06-02 NOTE — Patient Instructions (Addendum)
° ° ° °  Blood work was ordered to evaluate your thyroid function.

## 2021-06-03 DIAGNOSIS — R490 Dysphonia: Secondary | ICD-10-CM | POA: Diagnosis not present

## 2021-06-03 DIAGNOSIS — K219 Gastro-esophageal reflux disease without esophagitis: Secondary | ICD-10-CM | POA: Diagnosis not present

## 2021-06-03 DIAGNOSIS — R053 Chronic cough: Secondary | ICD-10-CM | POA: Diagnosis not present

## 2021-06-03 DIAGNOSIS — R070 Pain in throat: Secondary | ICD-10-CM | POA: Diagnosis not present

## 2021-06-11 ENCOUNTER — Ambulatory Visit (INDEPENDENT_AMBULATORY_CARE_PROVIDER_SITE_OTHER): Payer: Medicare Other

## 2021-06-11 DIAGNOSIS — I442 Atrioventricular block, complete: Secondary | ICD-10-CM | POA: Diagnosis not present

## 2021-06-12 LAB — CUP PACEART REMOTE DEVICE CHECK
Battery Remaining Longevity: 127 mo
Battery Voltage: 3.02 V
Brady Statistic AP VP Percent: 7.02 %
Brady Statistic AP VS Percent: 0 %
Brady Statistic AS VP Percent: 92.97 %
Brady Statistic AS VS Percent: 0.01 %
Brady Statistic RA Percent Paced: 7 %
Brady Statistic RV Percent Paced: 99.99 %
Date Time Interrogation Session: 20230119024905
Implantable Lead Implant Date: 20210720
Implantable Lead Implant Date: 20210720
Implantable Lead Location: 753859
Implantable Lead Location: 753860
Implantable Lead Model: 3830
Implantable Lead Model: 5076
Implantable Pulse Generator Implant Date: 20210720
Lead Channel Impedance Value: 247 Ohm
Lead Channel Impedance Value: 361 Ohm
Lead Channel Impedance Value: 380 Ohm
Lead Channel Impedance Value: 608 Ohm
Lead Channel Pacing Threshold Amplitude: 0.75 V
Lead Channel Pacing Threshold Amplitude: 1 V
Lead Channel Pacing Threshold Pulse Width: 0.4 ms
Lead Channel Pacing Threshold Pulse Width: 0.4 ms
Lead Channel Sensing Intrinsic Amplitude: 1.125 mV
Lead Channel Sensing Intrinsic Amplitude: 1.125 mV
Lead Channel Sensing Intrinsic Amplitude: 11.5 mV
Lead Channel Sensing Intrinsic Amplitude: 11.5 mV
Lead Channel Setting Pacing Amplitude: 1.5 V
Lead Channel Setting Pacing Amplitude: 2.5 V
Lead Channel Setting Pacing Pulse Width: 0.4 ms
Lead Channel Setting Sensing Sensitivity: 2 mV

## 2021-06-24 NOTE — Progress Notes (Signed)
Remote pacemaker transmission.   

## 2021-06-25 ENCOUNTER — Other Ambulatory Visit: Payer: Self-pay | Admitting: Internal Medicine

## 2021-06-25 DIAGNOSIS — I10 Essential (primary) hypertension: Secondary | ICD-10-CM

## 2021-07-13 ENCOUNTER — Other Ambulatory Visit: Payer: Self-pay | Admitting: Internal Medicine

## 2021-07-13 DIAGNOSIS — Z1231 Encounter for screening mammogram for malignant neoplasm of breast: Secondary | ICD-10-CM

## 2021-07-15 DIAGNOSIS — K219 Gastro-esophageal reflux disease without esophagitis: Secondary | ICD-10-CM | POA: Diagnosis not present

## 2021-07-15 DIAGNOSIS — R490 Dysphonia: Secondary | ICD-10-CM | POA: Diagnosis not present

## 2021-07-28 ENCOUNTER — Ambulatory Visit (INDEPENDENT_AMBULATORY_CARE_PROVIDER_SITE_OTHER): Payer: Medicare Other | Admitting: Internal Medicine

## 2021-07-28 ENCOUNTER — Encounter: Payer: Self-pay | Admitting: Internal Medicine

## 2021-07-28 ENCOUNTER — Other Ambulatory Visit: Payer: Self-pay

## 2021-07-28 VITALS — BP 124/70 | HR 75 | Temp 98.1°F | Ht 64.0 in | Wt 145.0 lb

## 2021-07-28 DIAGNOSIS — I10 Essential (primary) hypertension: Secondary | ICD-10-CM | POA: Diagnosis not present

## 2021-07-28 DIAGNOSIS — K219 Gastro-esophageal reflux disease without esophagitis: Secondary | ICD-10-CM | POA: Diagnosis not present

## 2021-07-28 DIAGNOSIS — E038 Other specified hypothyroidism: Secondary | ICD-10-CM

## 2021-07-28 NOTE — Progress Notes (Signed)
? ?Subjective:  ?Patient ID: Jessica Woodward, female    DOB: Sep 02, 1940  Age: 81 y.o. MRN: [id number] ? ?CC: Hypertension, Hyperlipidemia, and Gastroesophageal Reflux ? ?This visit occurred during the SARS-CoV-2 public health emergency.  Safety protocols were in place, including screening questions prior to the visit, additional usage of staff PPE, and extensive cleaning of exam room while observing appropriate contact time as indicated for disinfecting solutions.   ? ? ?HPI ?Jessica Woodward presents for f/up -  ? ?She recently saw ENT and was diagnosed with LPR - She is not taking gabapentin, pepcid, or prilosec due to SE's. She complains of laryngitis. ? ?Outpatient Medications Prior to Visit  ?Medication Sig Dispense Refill  ? acetaminophen (TYLENOL) 325 MG tablet Take 1-2 tablets (325-650 mg total) by mouth every 4 (four) hours as needed for mild pain.    ? atorvastatin (LIPITOR) 40 MG tablet TAKE 1 TABLET BY MOUTH  DAILY 90 tablet 1  ? irbesartan (AVAPRO) 75 MG tablet TAKE 1 TABLET BY MOUTH  DAILY 90 tablet 0  ? loratadine (CLARITIN) 10 MG tablet Take 10 mg by mouth daily.    ? Multiple Vitamin (MULTIVITAMIN) tablet Take 1 tablet by mouth daily.    ? triamcinolone cream (KENALOG) 0.1 % Apply 1 application topically 4 (four) times daily as needed (rash).     ? cyclobenzaprine (FLEXERIL) 5 MG tablet Take 1 tablet (5 mg total) by mouth 3 (three) times daily as needed for muscle spasms. 90 tablet 2  ? ?No facility-administered medications prior to visit.  ? ? ?ROS ?Review of Systems  ?Constitutional:  Negative for fatigue and unexpected weight change.  ?HENT:  Positive for voice change. Negative for sore throat and trouble swallowing.   ?Eyes: Negative.   ?Respiratory: Negative.  Negative for cough, shortness of breath and wheezing.   ?Cardiovascular:  Negative for chest pain, palpitations and leg swelling.  ?Gastrointestinal:  Negative for abdominal pain, diarrhea and nausea.  ?Endocrine: Negative for cold  intolerance and heat intolerance.  ?Genitourinary: Negative.  Negative for difficulty urinating.  ?Musculoskeletal: Negative.   ?Skin: Negative.   ?Neurological:  Negative for dizziness and weakness.  ?Hematological:  Negative for adenopathy. Does not bruise/bleed easily.  ?Psychiatric/Behavioral: Negative.    ? ?Objective:  ?BP 124/70 (BP Location: Right Arm, Patient Position: Sitting, Cuff Size: Large)   Pulse 75   Temp 98.1 ?F (36.7 ?C) (Oral)   Ht '5\' 4"'$  (1.626 m)   Wt 145 lb (65.8 kg)   SpO2 98%   BMI 24.89 kg/m?  ? ?BP Readings from Last 3 Encounters:  ?07/28/21 124/70  ?06/02/21 122/68  ?05/05/21 136/78  ? ? ?Wt Readings from Last 3 Encounters:  ?07/28/21 145 lb (65.8 kg)  ?06/02/21 146 lb (66.2 kg)  ?05/05/21 146 lb (66.2 kg)  ? ? ?Physical Exam ?Vitals reviewed.  ?HENT:  ?   Nose: Nose normal.  ?   Mouth/Throat:  ?   Mouth: Mucous membranes are moist.  ?Eyes:  ?   General: No scleral icterus. ?   Conjunctiva/sclera: Conjunctivae normal.  ?Cardiovascular:  ?   Rate and Rhythm: Normal rate and regular rhythm.  ?   Heart sounds: No murmur heard. ?Pulmonary:  ?   Effort: Pulmonary effort is normal.  ?   Breath sounds: No stridor. No wheezing, rhonchi or rales.  ?Abdominal:  ?   General: Abdomen is flat.  ?   Palpations: There is no mass.  ?   Tenderness: There is no abdominal  tenderness. There is no guarding or rebound.  ?   Hernia: No hernia is present.  ?Musculoskeletal:     ?   General: Normal range of motion.  ?   Cervical back: Neck supple.  ?   Right lower leg: No edema.  ?Lymphadenopathy:  ?   Cervical: No cervical adenopathy.  ?Skin: ?   General: Skin is warm and dry.  ?Neurological:  ?   General: No focal deficit present.  ?   Mental Status: She is alert.  ?Psychiatric:     ?   Mood and Affect: Mood normal.     ?   Behavior: Behavior normal.  ? ? ?Lab Results  ?Component Value Date  ? WBC 7.4 05/05/2021  ? HGB 12.7 05/05/2021  ? HCT 38.4 05/05/2021  ? PLT 284.0 05/05/2021  ? GLUCOSE 85 05/05/2021   ? CHOL 160 01/21/2021  ? TRIG 77.0 01/21/2021  ? HDL 65.20 01/21/2021  ? LDLDIRECT 166.9 03/23/2013  ? [place] 79 01/21/2021  ? ALT 15 01/21/2021  ? AST 21 01/21/2021  ? NA 137 05/05/2021  ? K 4.4 05/05/2021  ? CL 104 05/05/2021  ? CREATININE 0.82 05/05/2021  ? BUN 17 05/05/2021  ? CO2 26 05/05/2021  ? TSH 4.75 06/02/2021  ? HGBA1C 5.4 01/17/2012  ? ? ?MM 3D SCREEN BREAST BILATERAL ? ?Result Date: 08/26/2020 ?CLINICAL DATA:  Screening. EXAM: DIGITAL SCREENING BILATERAL MAMMOGRAM WITH TOMOSYNTHESIS AND CAD TECHNIQUE: Bilateral screening digital craniocaudal and mediolateral oblique mammograms were obtained. Bilateral screening digital breast tomosynthesis was performed. The images were evaluated with computer-aided detection. COMPARISON:  Previous exam(s). ACR Breast Density Category b: There are scattered areas of fibroglandular density. FINDINGS: There are no findings suspicious for malignancy. The images were evaluated with computer-aided detection. IMPRESSION: No mammographic evidence of malignancy. A result letter of this screening mammogram will be mailed directly to the patient. RECOMMENDATION: Screening mammogram in one year. (Code:SM-B-01Y) BI-RADS CATEGORY  1: Negative. Electronically Signed   By: Kristopher Oppenheim M.D.   On: 08/26/2020 09:04  ? ? ?Assessment & Plan:  ? ?Sharrie was seen today for hypertension, hyperlipidemia and gastroesophageal reflux. ? ?Diagnoses and all orders for this visit: ? ?Laryngopharyngeal reflux (LPR)- She does not tolerate the meds to treat this. ? ?Essential hypertension, benign- Her BP is well controlled. ? ?Subclinical hypothyroidism - TRT is not indicated. ? ? ?I have discontinued Jessica Woodward. Auman "Jessica Woodward"'s cyclobenzaprine. I am also having her maintain her triamcinolone cream, multivitamin, loratadine, acetaminophen, atorvastatin, and irbesartan. ? ?No orders of the defined types were placed in this encounter. ? ? ? ?Follow-up: No follow-ups on file. ? ?Jessica Calico, MD ?

## 2021-07-31 DIAGNOSIS — E038 Other specified hypothyroidism: Secondary | ICD-10-CM | POA: Insufficient documentation

## 2021-08-26 ENCOUNTER — Ambulatory Visit
Admission: RE | Admit: 2021-08-26 | Discharge: 2021-08-26 | Disposition: A | Discharge status: Home / Self Care | Payer: Medicare Other | Source: Ambulatory Visit | Attending: Internal Medicine | Admitting: Internal Medicine

## 2021-08-26 DIAGNOSIS — Z1231 Encounter for screening mammogram for malignant neoplasm of breast: Secondary | ICD-10-CM

## 2021-08-29 ENCOUNTER — Other Ambulatory Visit: Payer: Self-pay | Admitting: Internal Medicine

## 2021-08-29 DIAGNOSIS — E785 Hyperlipidemia, unspecified: Secondary | ICD-10-CM

## 2021-08-29 DIAGNOSIS — I700 Atherosclerosis of aorta: Secondary | ICD-10-CM

## 2021-08-29 DIAGNOSIS — I10 Essential (primary) hypertension: Secondary | ICD-10-CM

## 2021-09-10 ENCOUNTER — Ambulatory Visit (INDEPENDENT_AMBULATORY_CARE_PROVIDER_SITE_OTHER): Payer: Medicare Other

## 2021-09-10 DIAGNOSIS — I442 Atrioventricular block, complete: Secondary | ICD-10-CM

## 2021-09-10 LAB — CUP PACEART REMOTE DEVICE CHECK
Battery Remaining Longevity: 121 mo
Battery Voltage: 3.01 V
Brady Statistic AP VP Percent: 6.94 %
Brady Statistic AP VS Percent: 0 %
Brady Statistic AS VP Percent: 93.05 %
Brady Statistic AS VS Percent: 0.01 %
Brady Statistic RA Percent Paced: 6.92 %
Brady Statistic RV Percent Paced: 99.99 %
Date Time Interrogation Session: 20230419203931
Implantable Lead Implant Date: 20210720
Implantable Lead Implant Date: 20210720
Implantable Lead Location: 753859
Implantable Lead Location: 753860
Implantable Lead Model: 3830
Implantable Lead Model: 5076
Implantable Pulse Generator Implant Date: 20210720
Lead Channel Impedance Value: 247 Ohm
Lead Channel Impedance Value: 361 Ohm
Lead Channel Impedance Value: 399 Ohm
Lead Channel Impedance Value: 551 Ohm
Lead Channel Pacing Threshold Amplitude: 0.625 V
Lead Channel Pacing Threshold Amplitude: 1 V
Lead Channel Pacing Threshold Pulse Width: 0.4 ms
Lead Channel Pacing Threshold Pulse Width: 0.4 ms
Lead Channel Sensing Intrinsic Amplitude: 1.25 mV
Lead Channel Sensing Intrinsic Amplitude: 1.25 mV
Lead Channel Sensing Intrinsic Amplitude: 8.875 mV
Lead Channel Sensing Intrinsic Amplitude: 8.875 mV
Lead Channel Setting Pacing Amplitude: 1.5 V
Lead Channel Setting Pacing Amplitude: 2.5 V
Lead Channel Setting Pacing Pulse Width: 0.4 ms
Lead Channel Setting Sensing Sensitivity: 2 mV

## 2021-09-25 NOTE — Progress Notes (Signed)
Remote pacemaker transmission.   

## 2021-10-08 ENCOUNTER — Other Ambulatory Visit: Payer: Self-pay

## 2021-10-08 ENCOUNTER — Encounter (HOSPITAL_COMMUNITY): Payer: Self-pay

## 2021-10-08 ENCOUNTER — Emergency Department (HOSPITAL_COMMUNITY): Payer: Medicare Other

## 2021-10-08 ENCOUNTER — Emergency Department (HOSPITAL_COMMUNITY)
Admission: EM | Admit: 2021-10-08 | Discharge: 2021-10-08 | Disposition: A | Discharge status: Home / Self Care | Payer: Medicare Other | Attending: Emergency Medicine | Admitting: Emergency Medicine

## 2021-10-08 DIAGNOSIS — I10 Essential (primary) hypertension: Secondary | ICD-10-CM | POA: Insufficient documentation

## 2021-10-08 DIAGNOSIS — R519 Headache, unspecified: Secondary | ICD-10-CM | POA: Diagnosis not present

## 2021-10-08 DIAGNOSIS — R0789 Other chest pain: Secondary | ICD-10-CM | POA: Diagnosis not present

## 2021-10-08 DIAGNOSIS — R0602 Shortness of breath: Secondary | ICD-10-CM | POA: Diagnosis not present

## 2021-10-08 DIAGNOSIS — R079 Chest pain, unspecified: Secondary | ICD-10-CM | POA: Diagnosis not present

## 2021-10-08 LAB — BASIC METABOLIC PANEL
Anion gap: 6 (ref 5–15)
BUN: 23 mg/dL (ref 8–23)
CO2: 20 mmol/L — ABNORMAL LOW (ref 22–32)
Calcium: 8.7 mg/dL — ABNORMAL LOW (ref 8.9–10.3)
Chloride: 112 mmol/L — ABNORMAL HIGH (ref 98–111)
Creatinine, Ser: 0.82 mg/dL (ref 0.44–1.00)
GFR, Estimated: >60 mL/min (ref >60)
Glucose, Bld: 136 mg/dL — ABNORMAL HIGH (ref 70–99)
Potassium: 3.7 mmol/L (ref 3.5–5.1)
Sodium: 138 mmol/L (ref 135–145)

## 2021-10-08 LAB — CBC WITH DIFFERENTIAL/PLATELET
Abs Immature Granulocytes: 0.01 10*3/uL (ref 0.00–0.07)
Basophils Absolute: 0 10*3/uL (ref 0.0–0.1)
Basophils Relative: 0 %
Eosinophils Absolute: 0.1 10*3/uL (ref 0.0–0.5)
Eosinophils Relative: 2 %
HCT: 35.4 % — ABNORMAL LOW (ref 36.0–46.0)
Hemoglobin: 11.7 g/dL — ABNORMAL LOW (ref 12.0–15.0)
Immature Granulocytes: 0 %
Lymphocytes Relative: 23 %
Lymphs Abs: 1.5 10*3/uL (ref 0.7–4.0)
MCH: 31.6 pg (ref 26.0–34.0)
MCHC: 33.1 g/dL (ref 30.0–36.0)
MCV: 95.7 fL (ref 80.0–100.0)
Monocytes Absolute: 0.8 10*3/uL (ref 0.1–1.0)
Monocytes Relative: 12 %
Neutro Abs: 4.2 10*3/uL (ref 1.7–7.7)
Neutrophils Relative %: 63 %
Platelets: 219 10*3/uL (ref 150–400)
RBC: 3.7 MIL/uL — ABNORMAL LOW (ref 3.87–5.11)
RDW: 13.4 % (ref 11.5–15.5)
WBC: 6.7 10*3/uL (ref 4.0–10.5)
nRBC: 0 % (ref 0.0–0.2)

## 2021-10-08 LAB — TROPONIN I (HIGH SENSITIVITY)
Troponin I (High Sensitivity): 4 ng/L (ref <18)
Troponin I (High Sensitivity): 6 ng/L (ref <18)

## 2021-10-08 MED ORDER — NITROGLYCERIN 0.4 MG SL SUBL
0.4000 mg | SUBLINGUAL_TABLET | SUBLINGUAL | Status: DC | PRN
  Filled 2021-10-08: qty 1

## 2021-10-08 MED ORDER — ASPIRIN 81 MG PO CHEW
324.0000 mg | CHEWABLE_TABLET | Freq: Once | ORAL | Status: AC
  Administered 2021-10-08: 324 mg via ORAL
  Filled 2021-10-08: qty 4

## 2021-10-08 NOTE — ED Triage Notes (Signed)
Pt reports pt that started today.  Pt has hx of heart block with pacemaker.  Reports sob with exertion.  Resp even and unlabored in triage.  Denies n/v

## 2021-10-08 NOTE — ED Provider Notes (Signed)
[facility] EMERGENCY DEPARTMENT Provider Note   CSN: [id number] Arrival date & time: 10/08/21  1912     History  Chief Complaint  Patient presents with   Chest Pain    Jessica Woodward is a 81 y.o. female.  HPI 81 year old female presents with chest pain.  Started about an hour and a half ago.  She was not doing anything in particular when she developed some bilateral shoulder discomfort/neck discomfort and chest pain.  Feels like a pressure in her chest.  Also having a headache.  She feels like it hurts to take a deep breath.  Pain was worse prior to her taking a little bit of Tylenol and now is more moderate.  No leg pain or swelling or history of PE/DVT.  No recent travel.  She has a history of hypertension, breast cancer about 20 years ago, and hyperlipidemia.  No known CAD.  She has had a pacemaker placed 2 years ago for heart block.  Home Medications Prior to Admission medications   Medication Sig Start Date End Date Taking? Authorizing Provider  acetaminophen (TYLENOL) 325 MG tablet Take 1-2 tablets (325-650 mg total) by mouth every 4 (four) hours as needed for mild pain. 12/12/19  Yes Shirley Friar, PA-C  atorvastatin (LIPITOR) 40 MG tablet TAKE 1 TABLET BY MOUTH  DAILY 08/29/21  Yes Janith Lima, MD  irbesartan (AVAPRO) 75 MG tablet TAKE 1 TABLET BY MOUTH DAILY 08/29/21  Yes Janith Lima, MD  loratadine (CLARITIN) 10 MG tablet Take 10 mg by mouth daily.   Yes [provider]  Meloxicam 15 MG TBDP Take 15 mg by mouth daily as needed (pain).   Yes [provider]  Multiple Vitamin (MULTIVITAMIN) tablet Take 1 tablet by mouth daily.   Yes [provider]  triamcinolone cream (KENALOG) 0.1 % Apply 1 application topically 4 (four) times daily as needed (rash).  09/09/16  Yes [provider]      Allergies    Anastrozole, Lisinopril, Sulfasalazine, Sulfonamide derivatives, and Lactose intolerance (gi)    Review of Systems   Review of  Systems  Respiratory:  Negative for shortness of breath.   Cardiovascular:  Positive for chest pain. Negative for leg swelling.   Physical Exam Updated Vital Signs BP 136/81   Pulse 63   Temp 97.8 F (36.6 C) (Oral)   Resp 18   Ht '5\' 4"'$  (1.626 m)   Wt 64.4 kg   SpO2 97%   BMI 24.37 kg/m  Physical Exam Vitals and nursing note reviewed.  Constitutional:      Appearance: She is well-developed.  HENT:     Head: Normocephalic and atraumatic.  Cardiovascular:     Rate and Rhythm: Normal rate and regular rhythm.     Heart sounds: Normal heart sounds.  Pulmonary:     Effort: Pulmonary effort is normal.     Breath sounds: Normal breath sounds.  Abdominal:     Palpations: Abdomen is soft.     Tenderness: There is no abdominal tenderness.  Musculoskeletal:     Right lower leg: No tenderness. No edema.     Left lower leg: No tenderness. No edema.  Skin:    General: Skin is warm and dry.  Neurological:     Mental Status: She is alert.    ED Results / Procedures / Treatments   Labs (all labs ordered are listed, but only abnormal results are displayed) Labs Reviewed  BASIC METABOLIC PANEL - Abnormal; Notable  for the following components:      Result Value   Chloride 112 (*)    CO2 20 (*)    Glucose, Bld 136 (*)    Calcium 8.7 (*)    All other components within normal limits  CBC WITH DIFFERENTIAL/PLATELET - Abnormal; Notable for the following components:   RBC 3.70 (*)    Hemoglobin 11.7 (*)    HCT 35.4 (*)    All other components within normal limits  TROPONIN I (HIGH SENSITIVITY)  TROPONIN I (HIGH SENSITIVITY)    EKG EKG Interpretation  Date/Time:  Thursday Oct 08 2021 20:10:15 EDT Ventricular Rate:  70 PR Interval:  182 QRS Duration: 136 QT Interval:  450 QTC Calculation: 486 R Axis:   -67 Text Interpretation: Sinus rhythm Nonspecific IVCD with LAD Left ventricular hypertrophy similar to Dec 2021 Confirmed by Sherwood Gambler [phone]) on 10/08/2021 8:12:12  PM  Radiology DG Chest 2 View  Result Date: 10/08/2021 CLINICAL DATA:  Chest pain, shortness of breath EXAM: CHEST - 2 VIEW COMPARISON:  05/05/2021 FINDINGS: Mild patchy bilateral lower lobe opacities, favoring mild pneumonia, less likely atelectasis. No pleural effusion or pneumothorax. The heart is normal in size.  Left subclavian pacemaker. Visualized osseous structures are within normal limits. IMPRESSION: Mild patchy bilateral lower lobe opacities, favoring mild pneumonia, less likely atelectasis. Electronically Signed   By: Julian Hy M.D.   On: 10/08/2021 21:00    Procedures Procedures    Medications Ordered in ED Medications  aspirin chewable tablet 324 mg (324 mg Oral Given 10/08/21 2051)    ED Course/ Medical Decision Making/ A&P                           Medical Decision Making Amount and/or Complexity of Data Reviewed External Data Reviewed: notes. Labs: ordered. Radiology: ordered and independent interpretation performed. ECG/medicine tests: ordered and independent interpretation performed.  Risk OTC drugs. Prescription drug management.   EKG and troponins are benign. I think ACS is less likely, though with age, HTN, HLD she is at least moderate risk. She has declined nitroglycerin. I think PE is unlikely, especially with no risk factors, no leg swelling, hypoxia, tachycardia, etc. Offered admission vs close follow up, and she prefers discharge and follow up. Stressed need to return if symptoms recur/worsen. No infectious symptoms such as cough/fever. CXR images personally viewed/interpreted. I think PNA is less likely. Patient notes she didn't take a full breath, and I think this is more likely atelectasis. Obviously, if she develops respiratory symptoms she can call her doctor or return and would need antibiotics.         Final Clinical Impression(s) / ED Diagnoses Final diagnoses:  Nonspecific chest pain    Rx / DC Orders ED Discharge Orders      None         Sherwood Gambler, MD 10/09/21 1317

## 2021-10-08 NOTE — ED Notes (Addendum)
Pt does not want any Nitro at this time. Pt reports she already has a headache and wants to wait.

## 2021-10-08 NOTE — Discharge Instructions (Addendum)
If you develop recurrent, continued, or worsening chest pain, shortness of breath, fever, vomiting, abdominal or back pain, or any other new/concerning symptoms then return to the ER for evaluation.  

## 2021-10-09 ENCOUNTER — Ambulatory Visit (INDEPENDENT_AMBULATORY_CARE_PROVIDER_SITE_OTHER): Payer: Medicare Other

## 2021-10-09 ENCOUNTER — Ambulatory Visit (INDEPENDENT_AMBULATORY_CARE_PROVIDER_SITE_OTHER): Payer: Medicare Other | Admitting: Internal Medicine

## 2021-10-09 ENCOUNTER — Encounter: Payer: Self-pay | Admitting: Internal Medicine

## 2021-10-09 DIAGNOSIS — R5383 Other fatigue: Secondary | ICD-10-CM | POA: Diagnosis not present

## 2021-10-09 DIAGNOSIS — I10 Essential (primary) hypertension: Secondary | ICD-10-CM

## 2021-10-09 DIAGNOSIS — J189 Pneumonia, unspecified organism: Secondary | ICD-10-CM | POA: Diagnosis not present

## 2021-10-09 DIAGNOSIS — J984 Other disorders of lung: Secondary | ICD-10-CM | POA: Diagnosis not present

## 2021-10-09 DIAGNOSIS — R9389 Abnormal findings on diagnostic imaging of other specified body structures: Secondary | ICD-10-CM

## 2021-10-09 MED ORDER — LEVOFLOXACIN 500 MG PO TABS: 500.0000 mg | ORAL_TABLET | Freq: Every day | ORAL | 0 refills | Status: AC

## 2021-10-09 NOTE — Assessment & Plan Note (Signed)
BP Readings from Last 3 Encounters:  10/09/21 120/70  10/08/21 136/81  07/28/21 124/70   Stable, pt to continue medical treatment avapro

## 2021-10-09 NOTE — Progress Notes (Signed)
Patient ID: Jessica Woodward, female   DOB: 1940/05/26, 81 y.o.   MRN: [id number]        Chief Complaint: follow up CP,abnormal CXR, fatigue       HPI:  Jessica Woodward is a 81 y.o. female here after seen in ED with diffuse pressure like chest pain with radiation to neck and arms and associated with fatigue and some chills, but o/w not associated with diaphoresis, fever, cough, increased sob or doe, wheezing, orthopnea, PND, increased LE swelling, palpitations, dizziness or syncope.  Lab eval and troponins neg.  ECG SR.  CXR suggestive of bilateral LL CAP but did not seem consistent clinically and pt asked to return for any worsening infectious symptoms, and f/u here today.  Pt continues to deny fever, ST, cough, sob or wheezing or other new symptoms.   Pt denies polydipsia, polyuria, or new focal neuro s/s.    Pt denies fever, wt loss, night sweats, loss of appetite, or other constitutional symptoms         Wt Readings from Last 3 Encounters:  10/09/21 149 lb (67.6 kg)  10/08/21 142 lb (64.4 kg)  07/28/21 145 lb (65.8 kg)   BP Readings from Last 3 Encounters:  10/09/21 120/70  10/08/21 136/81  07/28/21 124/70         Past Medical History:  Diagnosis Date   Arthritis    Breast disorder    cancer   CARCINOMA, SQUAMOUS CELL, HX OF 06/05/2007   DUCTAL CARCINOMA IN SITU, LEFT BREAST 2002   s/p left lumpectomy, xrt and tamoxifen   History of chemotherapy    Hyperlipemia    Hypertension    Personal history of chemotherapy 2002   Personal history of radiation therapy 2002   RADIATION THERAPY, HX OF    Seasonal allergies    Skin cancer    Past Surgical History:  Procedure Laterality Date   ABDOMINAL HYSTERECTOMY     athroscopy right knee  2013   torn miniscus   BREAST BIOPSY Left 2002   BREAST EXCISIONAL BIOPSY Right    BREAST LUMPECTOMY Left 2002   had chemo and radiation   BREAST SURGERY     Left breast lumpectomy with axillary sential node   DILATION AND CURETTAGE OF UTERUS      Left knee surgery     MOHS SURGERY     (R) arm   PACEMAKER IMPLANT N/A 12/11/2019   Procedure: PACEMAKER IMPLANT;  Surgeon: Evans Lance, MD;  Location: [facility];  Service: Cardiovascular;  Laterality: N/A;   right breast lupectomy     TONSILLECTOMY AND ADENOIDECTOMY     TUBAL LIGATION      reports that she quit smoking about 57 years ago. Her smoking use included cigarettes. She has never used smokeless tobacco. She reports that she does not currently use alcohol. She reports that she does not use drugs. family history includes Cancer in her brother; Coronary artery disease in her father and mother; Dementia in her mother; Diabetes in her father and paternal grandmother; Heart attack in her son; Heart disease in her maternal grandfather, maternal grandmother, paternal grandfather, and paternal grandmother; Hypertension in her son and son; Kidney disease in her maternal grandmother; Macular degeneration in her father; Thyroid disease in her mother. Allergies  Allergen Reactions   Anastrozole Other (See Comments) and Rash    Causes muscle weakness   Lisinopril     cough   Sulfasalazine Hives   Sulfonamide Derivatives Hives  Lactose Intolerance (Gi) Diarrhea   Current Outpatient Medications on File Prior to Visit  Medication Sig Dispense Refill   acetaminophen (TYLENOL) 325 MG tablet Take 1-2 tablets (325-650 mg total) by mouth every 4 (four) hours as needed for mild pain.     atorvastatin (LIPITOR) 40 MG tablet TAKE 1 TABLET BY MOUTH  DAILY 90 tablet 1   irbesartan (AVAPRO) 75 MG tablet TAKE 1 TABLET BY MOUTH DAILY 90 tablet 1   loratadine (CLARITIN) 10 MG tablet Take 10 mg by mouth daily.     Meloxicam 15 MG TBDP Take 15 mg by mouth daily as needed (pain).     Multiple Vitamin (MULTIVITAMIN) tablet Take 1 tablet by mouth daily.     triamcinolone cream (KENALOG) 0.1 % Apply 1 application topically 4 (four) times daily as needed (rash).      No current facility-administered  medications on file prior to visit.        ROS:  All others reviewed and negative.  Objective        PE:  BP 120/70 (BP Location: Right Arm, Patient Position: Sitting, Cuff Size: Normal)   Pulse 80   Temp 99 F (37.2 C) (Oral)   Ht '5\' 4"'$  (1.626 m)   Wt 149 lb (67.6 kg)   SpO2 97%   BMI 25.58 kg/m                 Constitutional: Pt appears in NAD               HENT: Head: NCAT.                Right Ear: External ear normal.                 Left Ear: External ear normal.                Eyes: . Pupils are equal, round, and reactive to light. Conjunctivae and EOM are normal               Nose: without d/c or deformity               Neck: Neck supple. Gross normal ROM               Cardiovascular: Normal rate and regular rhythm.                 Pulmonary/Chest: Effort normal and breath sounds without rales or wheezing.                Abd:  Soft, NT, ND, + BS, no organomegaly               Neurological: Pt is alert. At baseline orientation, motor grossly intact               Skin: Skin is warm. No rashes, no other new lesions, LE edema - none               Psychiatric: Pt behavior is normal without agitation   Micro: none  Cardiac tracings I have personally interpreted today:  none  Pertinent Radiological findings (summarize): Oct 08, 2021 cxr  Mild patchy bilateral lower lobe opacities, favoring mild pneumonia, less likely atelectasis.     Lab Results  Component Value Date   WBC 6.7 10/08/2021   HGB 11.7 (L) 10/08/2021   HCT 35.4 (L) 10/08/2021   PLT 219 10/08/2021   GLUCOSE 136 (H) 10/08/2021   CHOL 160 01/21/2021  TRIG 77.0 01/21/2021   HDL 65.20 01/21/2021   LDLDIRECT 166.9 03/23/2013   LDLCALC 79 01/21/2021   ALT 15 01/21/2021   AST 21 01/21/2021   NA 138 10/08/2021   K 3.7 10/08/2021   CL 112 (H) 10/08/2021   CREATININE 0.82 10/08/2021   BUN 23 10/08/2021   CO2 20 (L) 10/08/2021   TSH 4.75 06/02/2021   HGBA1C 5.4 01/17/2012   Assessment/Plan:  Jessica Woodward is a 81 y.o. White or Caucasian [1] female with  has a past medical history of Arthritis, Breast disorder, CARCINOMA, SQUAMOUS CELL, HX OF (06/05/2007), DUCTAL CARCINOMA IN SITU, LEFT BREAST (2002), History of chemotherapy, Hyperlipemia, Hypertension, Personal history of chemotherapy (2002), Personal history of radiation therapy (2002), RADIATION THERAPY, HX OF, Seasonal allergies, and Skin cancer.  Abnormal CXR For repeat f/u cxr today -  to f/u any worsening symptoms or concerns  Essential hypertension, benign BP Readings from Last 3 Encounters:  10/09/21 120/70  10/08/21 136/81  07/28/21 124/70   Stable, pt to continue medical treatment avapro   Fatigue Etiology unclear, but amy be only indication of infetious process- for empiric levaquin rx as weekend starting, but will need to review repeat cxr first  Followup: Return if symptoms worsen or fail to improve.  Cathlean Cower, MD 10/09/2021 8:58 PM [facility]

## 2021-10-09 NOTE — Assessment & Plan Note (Signed)
Etiology unclear, but amy be only indication of infetious process- for empiric levaquin rx as weekend starting, but will need to review repeat cxr first

## 2021-10-09 NOTE — Patient Instructions (Signed)
We have sent the antibiotic to your pharmacy, but HOLD on taking unless the follow up CXR today is abnormal  You might also consider taking Rolaids to see if helps the discomfort at all  Please continue all other medications as before, and refills have been done if requested.  Please have the pharmacy call with any other refills you may need.  Please keep your appointments with your specialists as you may have planned  Please go to the XRAY Department in the first floor for the x-ray testing  You will be contacted by phone if any changes need to be made immediately.  Otherwise, you will receive a letter about your results with an explanation, but please check with MyChart first.  Please remember to sign up for MyChart if you have not done so, as this will be important to you in the future with finding out test results, communicating by private email, and scheduling acute appointments online when needed.

## 2021-10-09 NOTE — Assessment & Plan Note (Signed)
For repeat f/u cxr today -  to f/u any worsening symptoms or concerns

## 2021-10-22 ENCOUNTER — Telehealth: Payer: Self-pay | Admitting: Internal Medicine

## 2021-10-22 NOTE — Telephone Encounter (Signed)
Patient called and states that tylenol is not helping any longer and that she needs to restart her miloxicam 7.5 mg. - Please send to [facility] - 90 day supply.

## 2021-10-23 ENCOUNTER — Other Ambulatory Visit: Payer: Self-pay | Admitting: Internal Medicine

## 2021-10-23 DIAGNOSIS — M5030 Other cervical disc degeneration, unspecified cervical region: Secondary | ICD-10-CM

## 2021-10-23 MED ORDER — MELOXICAM 7.5 MG PO TABS: 7.5000 mg | ORAL_TABLET | Freq: Every day | ORAL | 1 refills | Status: DC

## 2021-10-28 ENCOUNTER — Ambulatory Visit (INDEPENDENT_AMBULATORY_CARE_PROVIDER_SITE_OTHER): Payer: Medicare Other | Admitting: Internal Medicine

## 2021-10-28 ENCOUNTER — Ambulatory Visit (INDEPENDENT_AMBULATORY_CARE_PROVIDER_SITE_OTHER): Payer: Medicare Other

## 2021-10-28 ENCOUNTER — Encounter: Payer: Self-pay | Admitting: Internal Medicine

## 2021-10-28 VITALS — BP 136/80 | HR 72 | Temp 98.0°F | Ht 64.0 in | Wt 147.0 lb

## 2021-10-28 DIAGNOSIS — S4992XA Unspecified injury of left shoulder and upper arm, initial encounter: Secondary | ICD-10-CM

## 2021-10-28 DIAGNOSIS — I10 Essential (primary) hypertension: Secondary | ICD-10-CM

## 2021-10-28 DIAGNOSIS — M19012 Primary osteoarthritis, left shoulder: Secondary | ICD-10-CM

## 2021-10-28 DIAGNOSIS — E038 Other specified hypothyroidism: Secondary | ICD-10-CM | POA: Diagnosis not present

## 2021-10-28 DIAGNOSIS — D539 Nutritional anemia, unspecified: Secondary | ICD-10-CM | POA: Diagnosis not present

## 2021-10-28 DIAGNOSIS — M5030 Other cervical disc degeneration, unspecified cervical region: Secondary | ICD-10-CM

## 2021-10-28 DIAGNOSIS — R739 Hyperglycemia, unspecified: Secondary | ICD-10-CM | POA: Diagnosis not present

## 2021-10-28 LAB — TSH: TSH: 3.36 u[IU]/mL (ref 0.35–5.50)

## 2021-10-28 LAB — CBC WITH DIFFERENTIAL/PLATELET
Basophils Absolute: 0.1 10*3/uL (ref 0.0–0.1)
Basophils Relative: 0.8 % (ref 0.0–3.0)
Eosinophils Absolute: 0.1 10*3/uL (ref 0.0–0.7)
Eosinophils Relative: 1.6 % (ref 0.0–5.0)
HCT: 41 % (ref 36.0–46.0)
Hemoglobin: 13.6 g/dL (ref 12.0–15.0)
Lymphocytes Relative: 31.8 % (ref 12.0–46.0)
Lymphs Abs: 2.3 10*3/uL (ref 0.7–4.0)
MCHC: 33.2 g/dL (ref 30.0–36.0)
MCV: 93 fl (ref 78.0–100.0)
Monocytes Absolute: 0.5 10*3/uL (ref 0.1–1.0)
Monocytes Relative: 7.4 % (ref 3.0–12.0)
Neutro Abs: 4.3 10*3/uL (ref 1.4–7.7)
Neutrophils Relative %: 58.4 % (ref 43.0–77.0)
Platelets: 306 10*3/uL (ref 150.0–400.0)
RBC: 4.4 Mil/uL (ref 3.87–5.11)
RDW: 13.9 % (ref 11.5–15.5)
WBC: 7.3 10*3/uL (ref 4.0–10.5)

## 2021-10-28 LAB — BASIC METABOLIC PANEL
BUN: 19 mg/dL (ref 6–23)
CO2: 24 mEq/L (ref 19–32)
Calcium: 10.3 mg/dL (ref 8.4–10.5)
Chloride: 105 mEq/L (ref 96–112)
Creatinine, Ser: 0.78 mg/dL (ref 0.40–1.20)
GFR: 71.77 mL/min (ref >60.00)
Glucose, Bld: 86 mg/dL (ref 70–99)
Potassium: 3.6 mEq/L (ref 3.5–5.1)
Sodium: 139 mEq/L (ref 135–145)

## 2021-10-28 LAB — IBC + FERRITIN
Ferritin: 86.5 ng/mL (ref 10.0–291.0)
Iron: 101 ug/dL (ref 42–145)
Saturation Ratios: 26.9 % (ref 20.0–50.0)
TIBC: 375.2 ug/dL (ref 250.0–450.0)
Transferrin: 268 mg/dL (ref 212.0–360.0)

## 2021-10-28 LAB — HEMOGLOBIN A1C: Hgb A1c MFr Bld: 5.8 % (ref 4.6–6.5)

## 2021-10-28 MED ORDER — MELOXICAM 7.5 MG PO TABS: 7.5000 mg | ORAL_TABLET | Freq: Every day | ORAL | 1 refills | Status: DC

## 2021-10-28 NOTE — Patient Instructions (Signed)

## 2021-10-28 NOTE — Progress Notes (Signed)
Subjective:  Patient ID: Jessica Woodward, female    DOB: 1940-07-09  Age: 81 y.o. MRN: [id number]  CC: Anemia and Hypertension   HPI Jessica Woodward presents for f/up -  She complains of left shoulder pain and decreased range of motion after lifting some boxes.  She does not recall any specific trauma or injury.  She is taking meloxicam for the pain.  She is active and denies chest pain, shortness of breath or edema.  She complains of chronic fatigue.  Outpatient Medications Prior to Visit  Medication Sig Dispense Refill   acetaminophen (TYLENOL) 325 MG tablet Take 1-2 tablets (325-650 mg total) by mouth every 4 (four) hours as needed for mild pain.     atorvastatin (LIPITOR) 40 MG tablet TAKE 1 TABLET BY MOUTH  DAILY 90 tablet 1   irbesartan (AVAPRO) 75 MG tablet TAKE 1 TABLET BY MOUTH DAILY 90 tablet 1   loratadine (CLARITIN) 10 MG tablet Take 10 mg by mouth daily.     Multiple Vitamin (MULTIVITAMIN) tablet Take 1 tablet by mouth daily.     triamcinolone cream (KENALOG) 0.1 % Apply 1 application topically 4 (four) times daily as needed (rash).      meloxicam (MOBIC) 7.5 MG tablet Take 1 tablet (7.5 mg total) by mouth daily. 90 tablet 1   No facility-administered medications prior to visit.    ROS Review of Systems  Constitutional:  Positive for fatigue. Negative for appetite change, chills, diaphoresis, fever and unexpected weight change.  HENT: Negative.    Eyes: Negative.   Respiratory:  Negative for cough, chest tightness, shortness of breath and wheezing.   Cardiovascular:  Negative for chest pain, palpitations and leg swelling.  Gastrointestinal:  Negative for abdominal pain.  Endocrine: Negative.   Genitourinary: Negative.  Negative for difficulty urinating.  Musculoskeletal:  Positive for arthralgias and neck pain. Negative for myalgias.  Neurological:  Negative for dizziness, weakness, light-headedness and headaches.  Hematological:  Negative for adenopathy. Does not  bruise/bleed easily.  Psychiatric/Behavioral: Negative.      Objective:  BP 136/80 (BP Location: Right Arm, Patient Position: Sitting, Cuff Size: Large)   Pulse 72   Temp 98 F (36.7 C) (Oral)   Ht '5\' 4"'$  (1.626 m)   Wt 147 lb (66.7 kg)   SpO2 96%   BMI 25.23 kg/m   BP Readings from Last 3 Encounters:  10/28/21 136/80  10/09/21 120/70  10/08/21 136/81    Wt Readings from Last 3 Encounters:  10/28/21 147 lb (66.7 kg)  10/09/21 149 lb (67.6 kg)  10/08/21 142 lb (64.4 kg)    Physical Exam Vitals reviewed.  HENT:     Nose: Nose normal.     Mouth/Throat:     Mouth: Mucous membranes are moist.  Eyes:     General: No scleral icterus.    Conjunctiva/sclera: Conjunctivae normal.  Cardiovascular:     Rate and Rhythm: Normal rate and regular rhythm.     Heart sounds: No murmur heard. Pulmonary:     Effort: Pulmonary effort is normal.     Breath sounds: No stridor. No wheezing, rhonchi or rales.  Abdominal:     General: Abdomen is flat.     Palpations: There is no mass.     Tenderness: There is no abdominal tenderness. There is no guarding.     Hernia: No hernia is present.  Musculoskeletal:        General: No swelling, tenderness or deformity.     Right  shoulder: Normal.     Left shoulder: Bony tenderness present. No swelling, deformity, tenderness or crepitus. Decreased range of motion. Normal strength.     Cervical back: Neck supple.     Right lower leg: No edema.     Left lower leg: No edema.  Lymphadenopathy:     Cervical: No cervical adenopathy.  Skin:    General: Skin is dry.  Neurological:     General: No focal deficit present.     Mental Status: She is alert.  Psychiatric:        Mood and Affect: Mood normal.        Behavior: Behavior normal.     Lab Results  Component Value Date   WBC 7.3 10/28/2021   HGB 13.6 10/28/2021   HCT 41.0 10/28/2021   PLT 306.0 10/28/2021   GLUCOSE 86 10/28/2021   CHOL 160 01/21/2021   TRIG 77.0 01/21/2021   HDL 65.20  01/21/2021   LDLDIRECT 166.9 03/23/2013   LDLCALC 79 01/21/2021   ALT 15 01/21/2021   AST 21 01/21/2021   NA 139 10/28/2021   K 3.6 10/28/2021   CL 105 10/28/2021   CREATININE 0.78 10/28/2021   BUN 19 10/28/2021   CO2 24 10/28/2021   TSH 3.36 10/28/2021   HGBA1C 5.8 10/28/2021    DG Chest 2 View  Result Date: 10/11/2021 CLINICAL DATA:  History of abnormal chest x-ray, bilateral pneumonia EXAM: CHEST - 2 VIEW COMPARISON:  10/08/2021 FINDINGS: Frontal and lateral views of the chest demonstrate stable dual lead pacer. The cardiac silhouette is unchanged. There has been interval resolution of the bibasilar airspace disease seen previously. Small residual right pleural effusion versus pleural thickening. No pneumothorax. No acute bony abnormalities. IMPRESSION: 1. Trace residual right pleural effusion versus pleural thickening. 2. Interval resolution of the bibasilar airspace disease seen previously. Electronically Signed   By: Randa Ngo M.D.   On: 10/11/2021 12:17   DG Chest 2 View  Result Date: 10/08/2021 CLINICAL DATA:  Chest pain, shortness of breath EXAM: CHEST - 2 VIEW COMPARISON:  05/05/2021 FINDINGS: Mild patchy bilateral lower lobe opacities, favoring mild pneumonia, less likely atelectasis. No pleural effusion or pneumothorax. The heart is normal in size.  Left subclavian pacemaker. Visualized osseous structures are within normal limits. IMPRESSION: Mild patchy bilateral lower lobe opacities, favoring mild pneumonia, less likely atelectasis. Electronically Signed   By: Julian Hy M.D.   On: 10/08/2021 21:00   DG Shoulder Left  Result Date: 10/28/2021 CLINICAL DATA:  Left shoulder pain for 10 days EXAM: LEFT SHOULDER - 2+ VIEW COMPARISON:  None Available. FINDINGS: There is no evidence of fracture or dislocation. Mild arthropathy of the [facility] joint. Glenohumeral joint appears within normal limits. Soft tissues are unremarkable. IMPRESSION: Mild arthropathy of the left AC joint.   No acute findings. Electronically Signed   By: Davina Poke D.O.   On: 10/28/2021 13:39     Assessment & Plan:   Jessica Woodward was seen today for anemia and hypertension.  Diagnoses and all orders for this visit:  Essential hypertension, benign- Her blood pressure is well controlled. -     Basic metabolic panel; Future -     CBC with Differential/Platelet; Future -     TSH; Future -     TSH -     CBC with Differential/Platelet -     Basic metabolic panel  Chronic hyperglycemia- Her A1c is normal. -     Basic metabolic panel; Future -  Hemoglobin A1c; Future -     Hemoglobin A1c -     Basic metabolic panel  Subclinical hypothyroidism- She is euthyroid. -     TSH; Future -     TSH  DDD (degenerative disc disease), cervical -     meloxicam (MOBIC) 7.5 MG tablet; Take 1 tablet (7.5 mg total) by mouth daily.  Deficiency anemia- Her H/H are normal now. -     IBC + Ferritin; Future -     IBC + Ferritin  Injury of left shoulder, initial encounter -     DG Shoulder Left; Future  Arthritis of left acromioclavicular joint -     Ambulatory referral to Orthopedic Surgery   I am having Jessica Look C. Hagin "Lovey Newcomer" maintain her triamcinolone cream, multivitamin, loratadine, acetaminophen, irbesartan, atorvastatin, and meloxicam.  Meds ordered this encounter  Medications   meloxicam (MOBIC) 7.5 MG tablet    Sig: Take 1 tablet (7.5 mg total) by mouth daily.    Dispense:  90 tablet    Refill:  1     Follow-up: Return in about 6 months (around 04/29/2022).  Scarlette Calico, MD

## 2021-11-05 ENCOUNTER — Encounter: Payer: Self-pay | Admitting: Internal Medicine

## 2021-11-09 DIAGNOSIS — M67814 Other specified disorders of tendon, left shoulder: Secondary | ICD-10-CM | POA: Diagnosis not present

## 2021-11-09 DIAGNOSIS — M7542 Impingement syndrome of left shoulder: Secondary | ICD-10-CM | POA: Diagnosis not present

## 2021-11-09 DIAGNOSIS — M7552 Bursitis of left shoulder: Secondary | ICD-10-CM | POA: Diagnosis not present

## 2021-11-12 ENCOUNTER — Encounter: Payer: Self-pay | Admitting: Internal Medicine

## 2021-12-10 ENCOUNTER — Ambulatory Visit (INDEPENDENT_AMBULATORY_CARE_PROVIDER_SITE_OTHER): Payer: Medicare Other

## 2021-12-10 DIAGNOSIS — Z01818 Encounter for other preprocedural examination: Secondary | ICD-10-CM

## 2021-12-10 LAB — CUP PACEART REMOTE DEVICE CHECK
Battery Remaining Longevity: 116 mo
Battery Voltage: 3.01 V
Brady Statistic AP VP Percent: 8.35 %
Brady Statistic AP VS Percent: 0 %
Brady Statistic AS VP Percent: 91.65 %
Brady Statistic AS VS Percent: 0.01 %
Brady Statistic RA Percent Paced: 8.32 %
Brady Statistic RV Percent Paced: 99.99 %
Date Time Interrogation Session: 20230720035337
Implantable Lead Implant Date: 20210720
Implantable Lead Implant Date: 20210720
Implantable Lead Location: 753859
Implantable Lead Location: 753860
Implantable Lead Model: 3830
Implantable Lead Model: 5076
Implantable Pulse Generator Implant Date: 20210720
Lead Channel Impedance Value: 266 Ohm
Lead Channel Impedance Value: 361 Ohm
Lead Channel Impedance Value: 456 Ohm
Lead Channel Impedance Value: 513 Ohm
Lead Channel Pacing Threshold Amplitude: 0.875 V
Lead Channel Pacing Threshold Amplitude: 0.875 V
Lead Channel Pacing Threshold Pulse Width: 0.4 ms
Lead Channel Pacing Threshold Pulse Width: 0.4 ms
Lead Channel Sensing Intrinsic Amplitude: 2.25 mV
Lead Channel Sensing Intrinsic Amplitude: 2.25 mV
Lead Channel Sensing Intrinsic Amplitude: 8.875 mV
Lead Channel Sensing Intrinsic Amplitude: 8.875 mV
Lead Channel Setting Pacing Amplitude: 1.75 V
Lead Channel Setting Pacing Amplitude: 2.5 V
Lead Channel Setting Pacing Pulse Width: 0.4 ms
Lead Channel Setting Sensing Sensitivity: 2 mV

## 2021-12-16 ENCOUNTER — Encounter: Payer: Self-pay | Admitting: Internal Medicine

## 2021-12-16 ENCOUNTER — Ambulatory Visit (INDEPENDENT_AMBULATORY_CARE_PROVIDER_SITE_OTHER): Payer: Medicare Other | Admitting: Internal Medicine

## 2021-12-16 VITALS — BP 140/78 | HR 69 | Ht 64.5 in | Wt 148.0 lb

## 2021-12-16 DIAGNOSIS — Z1211 Encounter for screening for malignant neoplasm of colon: Secondary | ICD-10-CM

## 2021-12-16 NOTE — Patient Instructions (Signed)
If you are age 80 or older, your body mass index should be between 23-30. Your Body mass index is 25.01 kg/m. If this is out of the aforementioned range listed, please consider follow up with your Primary Care Provider.  If you are age 24 or younger, your body mass index should be between 19-25. Your Body mass index is 25.01 kg/m. If this is out of the aformentioned range listed, please consider follow up with your Primary Care Provider.   Your provider has ordered Cologuard testing as an option for colon cancer screening. This is performed by [employer] and may be out of network with your insurance. PRIOR to completing the test, it is YOUR responsibility to contact your insurance about covered benefits for this test. Your out of pocket expense could be anywhere from $0.00 to $649.00.   When you call to check coverage with your insurer, please provide the following information:   -The ONLY provider of Cologuard is [place] code for Cologuard is [phone].  [employer] NPI # [id number]  -[employer] Tax ID # [id number]   We have already sent your demographic and insurance information to [employer] (phone number [phone]) and they should contact you within the next week regarding your test. If you have not heard from them within the next week, please call our office at [phone].    The [facility] providers would like to encourage you to use [facility] to communicate with providers for non-urgent requests or questions.  Due to long hold times on the telephone, sending your provider a message by [facility] may be a faster and more efficient way to get a response.  Please allow 48 business hours for a response.  Please remember that this is for non-urgent requests.   Thank you for entrusting me with your care and for choosing [facility], Dr. Christia Reading

## 2021-12-16 NOTE — Progress Notes (Signed)
Chief Complaint: Colon cancer screening  HPI : 81 year old female with history of arthritis, complete heart block s/p PM, breast cancer presents to discuss colon cancer screening.  She had her last colonoscopy with Dr. Olevia Perches in 2013 that was an incomplete exam due to redundant colon. She had a follow up barium enema procedure that was unremarkable. She denies blood in stools, changes in bowel habits, weight loss. Denies diarrhea or constipation. Denies N&V, dysphagia, or ab pain. Denies family history of colon cancer.    Past Medical History:  Diagnosis Date   Arthritis    Breast disorder    cancer   CARCINOMA, SQUAMOUS CELL, HX OF 06/05/2007   DUCTAL CARCINOMA IN SITU, LEFT BREAST 2002   s/p left lumpectomy, xrt and tamoxifen   History of chemotherapy    Hyperlipemia    Hypertension    Personal history of chemotherapy 2002   Personal history of radiation therapy 2002   RADIATION THERAPY, HX OF    Seasonal allergies    Skin cancer      Past Surgical History:  Procedure Laterality Date   ABDOMINAL HYSTERECTOMY     athroscopy right knee  2013   torn miniscus   BREAST BIOPSY Left 2002   BREAST EXCISIONAL BIOPSY Right    BREAST LUMPECTOMY Left 2002   had chemo and radiation   BREAST SURGERY     Left breast lumpectomy with axillary sential node   DILATION AND CURETTAGE OF UTERUS     Left knee surgery     Dr Hart Robinsons   MOHS SURGERY     (R) arm   PACEMAKER IMPLANT N/A 12/11/2019   Procedure: PACEMAKER IMPLANT;  Surgeon: Evans Lance, MD;  Location: [facility];  Service: Cardiovascular;  Laterality: N/A;   right breast lupectomy     TONSILLECTOMY AND ADENOIDECTOMY     TUBAL LIGATION     Family History  Problem Relation Age of Onset   Coronary artery disease Mother    Dementia Mother        Died at age 53   Thyroid disease Mother        hypothyroid   Macular degeneration Father    Coronary artery disease Father    Diabetes Father    Cancer  Brother        pancreatic   Heart disease Maternal Grandmother    Kidney disease Maternal Grandmother    Heart disease Maternal Grandfather    Heart disease Paternal Grandmother    Diabetes Paternal Grandmother    Heart disease Paternal Grandfather    Hypertension Son    Heart attack Son    Hypertension Son    Social History   Tobacco Use   Smoking status: Former    Types: Cigarettes    Quit date: 12/28/1963    Years since quitting: 58.0   Smokeless tobacco: Never  Vaping Use   Vaping Use: Never used  Substance Use Topics   Alcohol use: Yes    Comment: rare glass of wine   Drug use: No   Current Outpatient Medications  Medication Sig Dispense Refill   acetaminophen (TYLENOL) 325 MG tablet Take 1-2 tablets (325-650 mg total) by mouth every 4 (four) hours as needed for mild pain.     atorvastatin (LIPITOR) 40 MG tablet TAKE 1 TABLET BY MOUTH  DAILY 90 tablet 1   irbesartan (AVAPRO) 75 MG tablet TAKE 1 TABLET BY MOUTH DAILY 90 tablet 1   loratadine (  CLARITIN) 10 MG tablet Take 10 mg by mouth daily.     meloxicam (MOBIC) 7.5 MG tablet Take 1 tablet (7.5 mg total) by mouth daily. 90 tablet 1   Multiple Vitamin (MULTIVITAMIN) tablet Take 1 tablet by mouth daily.     triamcinolone cream (KENALOG) 0.1 % Apply 1 application topically 4 (four) times daily as needed (rash).      No current facility-administered medications for this visit.   Allergies  Allergen Reactions   Anastrozole Other (See Comments) and Rash    Causes muscle weakness   Lisinopril     cough   Sulfasalazine Hives   Sulfonamide Derivatives Hives   Lactose Intolerance (Gi) Diarrhea   Review of Systems: All systems reviewed and negative except where noted in HPI.   Physical Exam: BP 140/78   Pulse 69   Ht 5' 4.5" (1.638 m)   Wt 148 lb (67.1 kg)   BMI 25.01 kg/m  Constitutional: Pleasant,well-developed, female in no acute distress. HEENT: Normocephalic and atraumatic. Conjunctivae are normal. No scleral  icterus. Cardiovascular: Normal rate, regular rhythm.  Pulmonary/chest: Effort normal and breath sounds normal. No wheezing, rales or rhonchi. Abdominal: Soft, nondistended, nontender. Bowel sounds active throughout. There are no masses palpable. No hepatomegaly. Extremities: No edema Neurological: Alert and oriented to person place and time. Skin: Skin is warm and dry. No rashes noted. Psychiatric: Normal mood and affect. Behavior is normal.  Labs 10/2021: CBC nml. BMP nml. TSH nml. Ferritin/IBC nml.   Barium enema 10/06/11: IMPRESSION:  Negative exam.   Colonoscopy 10/06/11: Long redundant colon. Unable to complete exam. Recommended barium enema and repeat exam recommended in 10 years.   ASSESSMENT AND PLAN: Colon cancer screening Patient is here to discuss colon cancer screening options. She would like to hold off on doing a colonoscopy for now. She would prefer to do a stool test such as Cologuard for colon cancer screening. - Cologuard  Jessica Reading, MD

## 2021-12-21 DIAGNOSIS — M7542 Impingement syndrome of left shoulder: Secondary | ICD-10-CM | POA: Diagnosis not present

## 2021-12-21 DIAGNOSIS — M67814 Other specified disorders of tendon, left shoulder: Secondary | ICD-10-CM | POA: Diagnosis not present

## 2021-12-21 DIAGNOSIS — M7552 Bursitis of left shoulder: Secondary | ICD-10-CM | POA: Diagnosis not present

## 2021-12-22 DIAGNOSIS — Z1211 Encounter for screening for malignant neoplasm of colon: Secondary | ICD-10-CM | POA: Diagnosis not present

## 2021-12-23 ENCOUNTER — Other Ambulatory Visit (HOSPITAL_COMMUNITY): Payer: Self-pay | Admitting: Physician Assistant

## 2021-12-23 ENCOUNTER — Other Ambulatory Visit: Payer: Self-pay | Admitting: Physician Assistant

## 2021-12-23 DIAGNOSIS — M7582 Other shoulder lesions, left shoulder: Secondary | ICD-10-CM

## 2021-12-23 DIAGNOSIS — M67814 Other specified disorders of tendon, left shoulder: Secondary | ICD-10-CM

## 2021-12-31 ENCOUNTER — Encounter (HOSPITAL_COMMUNITY): Payer: Self-pay

## 2021-12-31 ENCOUNTER — Ambulatory Visit (HOSPITAL_COMMUNITY): Payer: Medicare Other

## 2022-01-01 LAB — COLOGUARD: COLOGUARD: NEGATIVE

## 2022-01-02 ENCOUNTER — Encounter: Payer: Self-pay | Admitting: Internal Medicine

## 2022-01-02 DIAGNOSIS — M5030 Other cervical disc degeneration, unspecified cervical region: Secondary | ICD-10-CM

## 2022-01-04 MED ORDER — MELOXICAM 7.5 MG PO TABS: 7.5000 mg | ORAL_TABLET | Freq: Every day | ORAL | 1 refills | Status: DC

## 2022-01-04 NOTE — Progress Notes (Signed)
Remote pacemaker transmission.   

## 2022-01-05 ENCOUNTER — Ambulatory Visit (HOSPITAL_COMMUNITY)
Admission: RE | Admit: 2022-01-05 | Discharge: 2022-01-05 | Disposition: A | Discharge status: Home / Self Care | Payer: Medicare Other | Source: Ambulatory Visit | Attending: Physician Assistant | Admitting: Physician Assistant

## 2022-01-05 ENCOUNTER — Encounter (HOSPITAL_COMMUNITY): Payer: Self-pay

## 2022-01-05 DIAGNOSIS — Z950 Presence of cardiac pacemaker: Secondary | ICD-10-CM | POA: Insufficient documentation

## 2022-01-05 DIAGNOSIS — M1990 Unspecified osteoarthritis, unspecified site: Secondary | ICD-10-CM | POA: Insufficient documentation

## 2022-01-05 DIAGNOSIS — M67814 Other specified disorders of tendon, left shoulder: Secondary | ICD-10-CM | POA: Insufficient documentation

## 2022-01-05 DIAGNOSIS — M7582 Other shoulder lesions, left shoulder: Secondary | ICD-10-CM | POA: Insufficient documentation

## 2022-01-05 DIAGNOSIS — M25512 Pain in left shoulder: Secondary | ICD-10-CM | POA: Insufficient documentation

## 2022-01-05 DIAGNOSIS — M19012 Primary osteoarthritis, left shoulder: Secondary | ICD-10-CM | POA: Diagnosis not present

## 2022-01-05 DIAGNOSIS — M75102 Unspecified rotator cuff tear or rupture of left shoulder, not specified as traumatic: Secondary | ICD-10-CM | POA: Diagnosis not present

## 2022-01-05 MED ORDER — IOHEXOL 180 MG/ML  SOLN
INTRAMUSCULAR | Status: AC
  Administered 2022-01-05: 10 mL
  Filled 2022-01-05: qty 10

## 2022-01-05 MED ORDER — SODIUM CHLORIDE (PF) 0.9 % IJ SOLN
INTRAMUSCULAR | Status: AC
  Administered 2022-01-05: 10 mL
  Filled 2022-01-05: qty 10

## 2022-01-05 MED ORDER — POVIDONE-IODINE 10 % EX SOLN
CUTANEOUS | Status: AC
  Administered 2022-01-05: 1
  Filled 2022-01-05: qty 14.8

## 2022-01-05 MED ORDER — LIDOCAINE HCL (PF) 1 % IJ SOLN
INTRAMUSCULAR | Status: AC
  Administered 2022-01-05: 5 mL
  Filled 2022-01-05: qty 5

## 2022-01-05 MED ORDER — IOHEXOL 180 MG/ML  SOLN
INTRAMUSCULAR | Status: AC
  Administered 2022-01-05: 5 mL
  Filled 2022-01-05: qty 10

## 2022-01-05 NOTE — Procedures (Signed)
Preprocedure Dx: Tendonitis of left rotator cuff Postprocedure Dx: Tendonitis of left rotator cuff Procedure  Fluoroscopically guided LEFT shoulder joint injection for CT arthrogrpahy Radiologist:  Thornton Papas Anesthesia:  4 ml of 1% lidocaine Injectate:  10 ml of standard CT arthrogram solution Fluoro time:  0 minutes 18 seconds EBL:   None Complications: None

## 2022-01-26 ENCOUNTER — Ambulatory Visit: Payer: Medicare Other | Admitting: Internal Medicine

## 2022-02-22 ENCOUNTER — Ambulatory Visit (INDEPENDENT_AMBULATORY_CARE_PROVIDER_SITE_OTHER): Payer: Medicare Other | Admitting: Internal Medicine

## 2022-02-22 ENCOUNTER — Encounter: Payer: Self-pay | Admitting: Internal Medicine

## 2022-02-22 VITALS — BP 126/68 | HR 85 | Temp 98.2°F | Ht 64.5 in | Wt 150.0 lb

## 2022-02-22 DIAGNOSIS — R0609 Other forms of dyspnea: Secondary | ICD-10-CM | POA: Diagnosis not present

## 2022-02-22 DIAGNOSIS — Z23 Encounter for immunization: Secondary | ICD-10-CM | POA: Diagnosis not present

## 2022-02-22 DIAGNOSIS — R9431 Abnormal electrocardiogram [ECG] [EKG]: Secondary | ICD-10-CM | POA: Diagnosis not present

## 2022-02-22 DIAGNOSIS — R5383 Other fatigue: Secondary | ICD-10-CM | POA: Insufficient documentation

## 2022-02-22 LAB — TROPONIN I (HIGH SENSITIVITY): High Sens Troponin I: 4 ng/L (ref 2–17)

## 2022-02-22 LAB — BASIC METABOLIC PANEL
BUN: 19 mg/dL (ref 6–23)
CO2: 25 mEq/L (ref 19–32)
Calcium: 9.6 mg/dL (ref 8.4–10.5)
Chloride: 105 mEq/L (ref 96–112)
Creatinine, Ser: 0.72 mg/dL (ref 0.40–1.20)
GFR: 78.83 mL/min (ref >60.00)
Glucose, Bld: 99 mg/dL (ref 70–99)
Potassium: 3.8 mEq/L (ref 3.5–5.1)
Sodium: 137 mEq/L (ref 135–145)

## 2022-02-22 LAB — CBC WITH DIFFERENTIAL/PLATELET
Basophils Absolute: 0.1 10*3/uL (ref 0.0–0.1)
Basophils Relative: 0.6 % (ref 0.0–3.0)
Eosinophils Absolute: 0.2 10*3/uL (ref 0.0–0.7)
Eosinophils Relative: 2.3 % (ref 0.0–5.0)
HCT: 33.5 % — ABNORMAL LOW (ref 36.0–46.0)
Hemoglobin: 11.4 g/dL — ABNORMAL LOW (ref 12.0–15.0)
Lymphocytes Relative: 18.6 % (ref 12.0–46.0)
Lymphs Abs: 1.7 10*3/uL (ref 0.7–4.0)
MCHC: 34.1 g/dL (ref 30.0–36.0)
MCV: 92.9 fl (ref 78.0–100.0)
Monocytes Absolute: 1 10*3/uL (ref 0.1–1.0)
Monocytes Relative: 10.4 % (ref 3.0–12.0)
Neutro Abs: 6.3 10*3/uL (ref 1.4–7.7)
Neutrophils Relative %: 68.1 % (ref 43.0–77.0)
Platelets: 394 10*3/uL (ref 150.0–400.0)
RBC: 3.61 Mil/uL — ABNORMAL LOW (ref 3.87–5.11)
RDW: 13.6 % (ref 11.5–15.5)
WBC: 9.3 10*3/uL (ref 4.0–10.5)

## 2022-02-22 LAB — BRAIN NATRIURETIC PEPTIDE: Pro B Natriuretic peptide (BNP): 111 pg/mL — ABNORMAL HIGH (ref 0.0–100.0)

## 2022-02-22 NOTE — Progress Notes (Unsigned)
Subjective:  Patient ID: Jessica Woodward, female    DOB: 06/22/1940  Age: 81 y.o. MRN: [id number]  CC: No chief complaint on file.   HPI Jessica Woodward presents for ***  Outpatient Medications Prior to Visit  Medication Sig Dispense Refill   acetaminophen (TYLENOL) 325 MG tablet Take 1-2 tablets (325-650 mg total) by mouth every 4 (four) hours as needed for mild pain.     atorvastatin (LIPITOR) 40 MG tablet TAKE 1 TABLET BY MOUTH  DAILY 90 tablet 1   irbesartan (AVAPRO) 75 MG tablet TAKE 1 TABLET BY MOUTH DAILY 90 tablet 1   loratadine (CLARITIN) 10 MG tablet Take 10 mg by mouth daily.     meloxicam (MOBIC) 7.5 MG tablet Take 1 tablet (7.5 mg total) by mouth daily. 90 tablet 1   Multiple Vitamin (MULTIVITAMIN) tablet Take 1 tablet by mouth daily.     triamcinolone cream (KENALOG) 0.1 % Apply 1 application topically 4 (four) times daily as needed (rash).      No facility-administered medications prior to visit.    ROS Review of Systems  Objective:  BP 126/68 (BP Location: Left Arm, Patient Position: Sitting, Cuff Size: Large)   Pulse 85   Temp 98.2 F (36.8 C) (Oral)   Ht 5' 4.5" (1.638 m)   Wt 150 lb (68 kg)   SpO2 96%   BMI 25.35 kg/m   BP Readings from Last 3 Encounters:  02/22/22 126/68  01/05/22 136/84  12/16/21 140/78    Wt Readings from Last 3 Encounters:  02/22/22 150 lb (68 kg)  12/16/21 148 lb (67.1 kg)  10/28/21 147 lb (66.7 kg)    Physical Exam Cardiovascular:     Rate and Rhythm: Normal rate and regular rhythm.     Heart sounds: Normal heart sounds, S1 normal and S2 normal.     No friction rub. No gallop.     Comments: EKG- NSR, 78 bpm LAD, LVH with QRS widening and repol Inferior infarct - old Anteroseptal infarct is new Musculoskeletal:     Right lower leg: No edema.     Left lower leg: No edema.     Lab Results  Component Value Date   WBC 7.3 10/28/2021   HGB 13.6 10/28/2021   HCT 41.0 10/28/2021   PLT 306.0 10/28/2021   GLUCOSE 86  10/28/2021   CHOL 160 01/21/2021   TRIG 77.0 01/21/2021   HDL 65.20 01/21/2021   LDLDIRECT 166.9 03/23/2013   LDLCALC 79 01/21/2021   ALT 15 01/21/2021   AST 21 01/21/2021   NA 139 10/28/2021   K 3.6 10/28/2021   CL 105 10/28/2021   CREATININE 0.78 10/28/2021   BUN 19 10/28/2021   CO2 24 10/28/2021   TSH 3.36 10/28/2021   HGBA1C 5.8 10/28/2021    CT SHOULDER LEFT W CONTRAST  Result Date: 01/07/2022 CLINICAL DATA:  Left rotator tendinitis.  Left shoulder pain. EXAM: CT ARTHROGRAPHY OF THE LEFT SHOULDER TECHNIQUE: Multidetector CT imaging was performed following the standard protocol after injection of dilute contrast into the joint. RADIATION DOSE REDUCTION: This exam was performed according to the departmental dose-optimization program which includes automated exposure control, adjustment of the mA and/or kV according to patient size and/or use of iterative reconstruction technique. COMPARISON:  None Available. FINDINGS: Rotator cuff: Supraspinatus tendon is intact. Infraspinatus tendon is intact. Teres minor tendon is intact. Small amount of contrast in the subscapularis tendon which may be iatrogenic versus a small partial-thickness articular surface tear. Muscles:  No muscle atrophy. No intramuscular fluid collection or hematoma. Biceps Long Head: Intraarticular and extraarticular portions of the biceps tendon are intact. Acromioclavicular Joint: Mild arthropathy of the acromioclavicular joint. No subacromial/subdeltoid bursal fluid. Glenohumeral Joint: Intraarticular contrast distending the joint capsule. Partial-thickness cartilage loss of the humeral joint with areas of high-grade partial-thickness cartilage loss of the humeral head. Labrum: No labral tear.  Degeneration of the posterosuperior labrum. Bones: No fracture or dislocation. No aggressive osseous lesion. Other: No fluid collection or hematoma. IMPRESSION: 1. Small amount of contrast in the subscapularis tendon which may be  iatrogenic versus a small partial-thickness articular surface tear. 2. Partial-thickness cartilage loss of the humeral joint with areas of high-grade partial-thickness cartilage loss of the humeral head consistent with mild-moderate osteoarthritis. Electronically Signed   By: Kathreen Devoid M.D.   On: 01/07/2022 10:40   DG FLUORO GUIDED NEEDLE PLC ASPIRATION/INJECTION LOC  Result Date: 01/05/2022 CLINICAL DATA:  Tendinitis of LEFT rotator cuff, LEFT shoulder pain for a few months, denies injury. Patient unable to have MR due to pacemaker EXAM: LEFT SHOULDER INJECTION UNDER FLUOROSCOPY TECHNIQUE: Procedure, risks, benefits and alternatives explained to the patient. Patient's questions answered. Written informed consent obtained. Timeout protocol followed. LEFT shoulder joint localized by fluoroscopy. Skin prepped and draped in usual sterile fashion. Skin and soft tissues anesthetized with 4 mL of 1% lidocaine. Under fluoroscopic guidance, 22 gauge spinal needle was advanced into LEFT shoulder joint. 10 mL of a solution consisting of 5 mL sterile saline and 15 mL Omnipaque-180 was injected into LEFT shoulder joint without difficulty. Procedure tolerated well by patient without immediate complication. FLUOROSCOPY: Radiation Exposure Index (as provided by the fluoroscopic device): 1.6 mGy FINDINGS: As above IMPRESSION: Technically successful LEFT shoulder joint injection for CT arthrography. Electronically Signed   By: Lavonia Dana M.D.   On: 01/05/2022 15:59    Assessment & Plan:   Diagnoses and all orders for this visit:  Flu vaccine need -     Flu Vaccine QUAD High Dose(Fluad)  DOE (dyspnea on exertion) -     Troponin I (High Sensitivity); Future -     Brain natriuretic peptide; Future -     MYOCARDIAL PERFUSION IMAGING; Future -     Cardiac Stress Test: Informed Consent Details: Physician/Practitioner Attestation; Transcribe to consent form and obtain patient signature; Future -     CBC with  Differential/Platelet; Future  Abnormal electrocardiogram (ECG) (EKG) -     MYOCARDIAL PERFUSION IMAGING; Future -     Cardiac Stress Test: Informed Consent Details: Physician/Practitioner Attestation; Transcribe to consent form and obtain patient signature; Future  Abnormal electrocardiogram -     MYOCARDIAL PERFUSION IMAGING; Future -     Cardiac Stress Test: Informed Consent Details: Physician/Practitioner Attestation; Transcribe to consent form and obtain patient signature; Future  Other fatigue -     Basic metabolic panel; Future -     Thyroid Panel With TSH; Future -     CBC with Differential/Platelet; Future   I am having Jessica Woodward. Jessica Woodward "Jessica Woodward" maintain her triamcinolone cream, multivitamin, loratadine, acetaminophen, irbesartan, atorvastatin, and meloxicam.  No orders of the defined types were placed in this encounter.    Follow-up: No follow-ups on file.  Scarlette Calico, MD

## 2022-02-23 LAB — THYROID PANEL WITH TSH
Free Thyroxine Index: 2.2 (ref 1.4–3.8)
T3 Uptake: 31 % (ref 22–35)
T4, Total: 7 ug/dL (ref 5.1–11.9)
TSH: 2.57 mIU/L (ref 0.40–4.50)

## 2022-02-24 ENCOUNTER — Telehealth (HOSPITAL_COMMUNITY): Payer: Self-pay | Admitting: *Deleted

## 2022-02-24 NOTE — Telephone Encounter (Signed)
Patient given detailed instructions per Myocardial Perfusion Study Information Sheet for the test on 02/26/2022 at 11:00. Patient notified to arrive 15 minutes early and that it is imperative to arrive on time for appointment to keep from having the test rescheduled.  If you need to cancel or reschedule your appointment, please call the office within 24 hours of your appointment. . Patient verbalized understanding.Jessica Woodward

## 2022-02-25 ENCOUNTER — Encounter: Payer: Self-pay | Admitting: Internal Medicine

## 2022-02-25 NOTE — Addendum Note (Signed)
Addended by: Hinda Kehr on: 02/25/2022 11:31 AM   Modules accepted: Orders

## 2022-02-26 ENCOUNTER — Ambulatory Visit (HOSPITAL_COMMUNITY): Payer: Medicare Other | Attending: Internal Medicine

## 2022-02-26 DIAGNOSIS — R110 Nausea: Secondary | ICD-10-CM | POA: Diagnosis not present

## 2022-02-26 DIAGNOSIS — R9431 Abnormal electrocardiogram [ECG] [EKG]: Secondary | ICD-10-CM | POA: Insufficient documentation

## 2022-02-26 DIAGNOSIS — R0609 Other forms of dyspnea: Secondary | ICD-10-CM | POA: Diagnosis not present

## 2022-02-26 LAB — MYOCARDIAL PERFUSION IMAGING
LV dias vol: 46 mL (ref 46–106)
LV sys vol: 13 mL
Nuc Stress EF: 72 %
Peak HR: 105 {beats}/min
Rest HR: 72 {beats}/min
Rest Nuclear Isotope Dose: 10.2 mCi
SDS: 0
SRS: 0
SSS: 0
ST Depression (mm): 0 mm
Stress Nuclear Isotope Dose: 29.8 mCi
TID: 0.93

## 2022-02-26 MED ORDER — TECHNETIUM TC 99M TETROFOSMIN IV KIT
29.8000 | PACK | Freq: Once | INTRAVENOUS | Status: AC | PRN
  Administered 2022-02-26: 29.8 via INTRAVENOUS

## 2022-02-26 MED ORDER — REGADENOSON 0.4 MG/5ML IV SOLN
0.4000 mg | Freq: Once | INTRAVENOUS | Status: AC
  Administered 2022-02-26: 0.4 mg via INTRAVENOUS

## 2022-02-26 MED ORDER — TECHNETIUM TC 99M TETROFOSMIN IV KIT
10.2000 | PACK | Freq: Once | INTRAVENOUS | Status: AC | PRN
  Administered 2022-02-26: 10.2 via INTRAVENOUS

## 2022-02-26 MED ORDER — AMINOPHYLLINE 25 MG/ML IV SOLN
75.0000 mg | Freq: Once | INTRAVENOUS | Status: AC
  Administered 2022-02-26: 75 mg via INTRAVENOUS

## 2022-03-01 ENCOUNTER — Telehealth: Payer: Self-pay

## 2022-03-01 NOTE — Telephone Encounter (Signed)
Patient states she had imaging done at the Cardiologist, was advised from Dr.Jones to schedule an appointment shortly after so they can go over results with her. Patient is wondering if we can work her in this week.

## 2022-03-01 NOTE — Telephone Encounter (Signed)
Patient is scheduled for 220 on 10/12

## 2022-03-04 ENCOUNTER — Encounter: Payer: Self-pay | Admitting: Internal Medicine

## 2022-03-04 ENCOUNTER — Ambulatory Visit (INDEPENDENT_AMBULATORY_CARE_PROVIDER_SITE_OTHER): Payer: Medicare Other | Admitting: Internal Medicine

## 2022-03-04 ENCOUNTER — Ambulatory Visit: Payer: Medicare Other | Admitting: Internal Medicine

## 2022-03-04 VITALS — BP 138/84 | HR 83 | Temp 98.3°F | Ht 64.5 in | Wt 150.0 lb

## 2022-03-04 DIAGNOSIS — D75839 Thrombocytosis, unspecified: Secondary | ICD-10-CM

## 2022-03-04 DIAGNOSIS — D508 Other iron deficiency anemias: Secondary | ICD-10-CM

## 2022-03-04 DIAGNOSIS — D539 Nutritional anemia, unspecified: Secondary | ICD-10-CM

## 2022-03-04 DIAGNOSIS — D510 Vitamin B12 deficiency anemia due to intrinsic factor deficiency: Secondary | ICD-10-CM

## 2022-03-04 DIAGNOSIS — D538 Other specified nutritional anemias: Secondary | ICD-10-CM

## 2022-03-04 LAB — FOLATE: Folate: >23.9 ng/mL (ref >5.9)

## 2022-03-04 LAB — CBC WITH DIFFERENTIAL/PLATELET
Basophils Absolute: 0.1 10*3/uL (ref 0.0–0.1)
Basophils Relative: 0.8 % (ref 0.0–3.0)
Eosinophils Absolute: 0.4 10*3/uL (ref 0.0–0.7)
Eosinophils Relative: 3.5 % (ref 0.0–5.0)
HCT: 34.5 % — ABNORMAL LOW (ref 36.0–46.0)
Hemoglobin: 11.7 g/dL — ABNORMAL LOW (ref 12.0–15.0)
Lymphocytes Relative: 18.8 % (ref 12.0–46.0)
Lymphs Abs: 2 10*3/uL (ref 0.7–4.0)
MCHC: 33.8 g/dL (ref 30.0–36.0)
MCV: 92.6 fl (ref 78.0–100.0)
Monocytes Absolute: 1.2 10*3/uL — ABNORMAL HIGH (ref 0.1–1.0)
Monocytes Relative: 11.4 % (ref 3.0–12.0)
Neutro Abs: 6.8 10*3/uL (ref 1.4–7.7)
Neutrophils Relative %: 65.5 % (ref 43.0–77.0)
Platelets: 437 10*3/uL — ABNORMAL HIGH (ref 150.0–400.0)
RBC: 3.73 Mil/uL — ABNORMAL LOW (ref 3.87–5.11)
RDW: 13.2 % (ref 11.5–15.5)
WBC: 10.4 10*3/uL (ref 4.0–10.5)

## 2022-03-04 LAB — IBC + FERRITIN
Ferritin: 166.4 ng/mL (ref 10.0–291.0)
Iron: 30 ug/dL — ABNORMAL LOW (ref 42–145)
Saturation Ratios: 10.5 % — ABNORMAL LOW (ref 20.0–50.0)
TIBC: 285.6 ug/dL (ref 250.0–450.0)
Transferrin: 204 mg/dL — ABNORMAL LOW (ref 212.0–360.0)

## 2022-03-04 LAB — VITAMIN B12: Vitamin B-12: 920 pg/mL — ABNORMAL HIGH (ref 211–911)

## 2022-03-04 NOTE — Progress Notes (Unsigned)
Subjective:  Patient ID: Jessica Woodward, female    DOB: 1940/06/10  Age: 81 y.o. MRN: [id number]  CC: Anemia   HPI Jessica Woodward presents for f/up -  She continues to complain of fatigue.  Jessica Woodward Lexiscan was normal.  She has also had a decrease in concentration but denies headache, blurred vision, or paresthesias.  Outpatient Medications Prior to Visit  Medication Sig Dispense Refill   acetaminophen (TYLENOL) 325 MG tablet Take 1-2 tablets (325-650 mg total) by mouth every 4 (four) hours as needed for mild pain.     atorvastatin (LIPITOR) 40 MG tablet TAKE 1 TABLET BY MOUTH  DAILY 90 tablet 1   irbesartan (AVAPRO) 75 MG tablet TAKE 1 TABLET BY MOUTH DAILY 90 tablet 1   loratadine (CLARITIN) 10 MG tablet Take 10 mg by mouth daily.     meloxicam (MOBIC) 7.5 MG tablet Take 1 tablet (7.5 mg total) by mouth daily. 90 tablet 1   Multiple Vitamin (MULTIVITAMIN) tablet Take 1 tablet by mouth daily.     triamcinolone cream (KENALOG) 0.1 % Apply 1 application topically 4 (four) times daily as needed (rash).      No facility-administered medications prior to visit.    ROS Review of Systems  Constitutional:  Positive for fatigue. Negative for appetite change, chills, diaphoresis, fever and unexpected weight change.  HENT: Negative.    Eyes: Negative.   Respiratory:  Negative for cough, chest tightness, shortness of breath and wheezing.   Cardiovascular:  Negative for chest pain, palpitations and leg swelling.  Gastrointestinal:  Negative for abdominal pain, constipation, diarrhea and vomiting.  Endocrine: Negative.   Genitourinary: Negative.  Negative for difficulty urinating.  Musculoskeletal: Negative.   Skin: Negative.   Neurological: Negative.  Negative for dizziness, weakness and light-headedness.  Hematological:  Negative for adenopathy. Does not bruise/bleed easily.  Psychiatric/Behavioral:  Positive for decreased concentration. Negative for dysphoric mood and sleep disturbance. The  patient is not nervous/anxious.     Objective:  BP 138/84 (BP Location: Left Arm, Patient Position: Sitting, Cuff Size: Large)   Pulse 83   Temp 98.3 F (36.8 C) (Oral)   Ht 5' 4.5" (1.638 m)   Wt 150 lb (68 kg)   SpO2 95%   BMI 25.35 kg/m   BP Readings from Last 3 Encounters:  03/04/22 138/84  02/22/22 126/68  01/05/22 136/84    Wt Readings from Last 3 Encounters:  03/04/22 150 lb (68 kg)  02/22/22 150 lb (68 kg)  12/16/21 148 lb (67.1 kg)    Physical Exam Vitals reviewed.  HENT:     Nose: Nose normal.     Mouth/Throat:     Mouth: Mucous membranes are moist.  Eyes:     General: No scleral icterus.    Conjunctiva/sclera: Conjunctivae normal.  Cardiovascular:     Rate and Rhythm: Normal rate and regular rhythm.     Heart sounds: No murmur heard. Pulmonary:     Effort: Pulmonary effort is normal.     Breath sounds: No stridor. No wheezing, rhonchi or rales.  Abdominal:     General: Abdomen is flat.     Palpations: There is no mass.     Tenderness: There is no abdominal tenderness. There is no guarding.     Hernia: No hernia is present.  Musculoskeletal:        General: Normal range of motion.     Cervical back: Neck supple.     Right lower leg: No edema.  Left lower leg: No edema.  Lymphadenopathy:     Cervical: No cervical adenopathy.  Skin:    General: Skin is warm and dry.  Neurological:     General: No focal deficit present.     Mental Status: She is alert.  Psychiatric:        Mood and Affect: Mood normal.        Behavior: Behavior normal.     Lab Results  Component Value Date   WBC 10.4 03/04/2022   HGB 11.7 (L) 03/04/2022   HCT 34.5 (L) 03/04/2022   PLT 437.0 (H) 03/04/2022   GLUCOSE 99 02/22/2022   CHOL 160 01/21/2021   TRIG 77.0 01/21/2021   HDL 65.20 01/21/2021   LDLDIRECT 166.9 03/23/2013   LDLCALC 79 01/21/2021   ALT 15 01/21/2021   AST 21 01/21/2021   NA 137 02/22/2022   K 3.8 02/22/2022   CL 105 02/22/2022   CREATININE  0.72 02/22/2022   BUN 19 02/22/2022   CO2 25 02/22/2022   TSH 2.57 02/22/2022   HGBA1C 5.8 10/28/2021    CT SHOULDER LEFT W CONTRAST  Result Date: 01/07/2022 CLINICAL DATA:  Left rotator tendinitis.  Left shoulder pain. EXAM: CT ARTHROGRAPHY OF THE LEFT SHOULDER TECHNIQUE: Multidetector CT imaging was performed following the standard protocol after injection of dilute contrast into the joint. RADIATION DOSE REDUCTION: This exam was performed according to the departmental dose-optimization program which includes automated exposure control, adjustment of the mA and/or kV according to patient size and/or use of iterative reconstruction technique. COMPARISON:  None Available. FINDINGS: Rotator cuff: Supraspinatus tendon is intact. Infraspinatus tendon is intact. Teres minor tendon is intact. Small amount of contrast in the subscapularis tendon which may be iatrogenic versus a small partial-thickness articular surface tear. Muscles: No muscle atrophy. No intramuscular fluid collection or hematoma. Biceps Long Head: Intraarticular and extraarticular portions of the biceps tendon are intact. Acromioclavicular Joint: Mild arthropathy of the acromioclavicular joint. No subacromial/subdeltoid bursal fluid. Glenohumeral Joint: Intraarticular contrast distending the joint capsule. Partial-thickness cartilage loss of the humeral joint with areas of high-grade partial-thickness cartilage loss of the humeral head. Labrum: No labral tear.  Degeneration of the posterosuperior labrum. Bones: No fracture or dislocation. No aggressive osseous lesion. Other: No fluid collection or hematoma. IMPRESSION: 1. Small amount of contrast in the subscapularis tendon which may be iatrogenic versus a small partial-thickness articular surface tear. 2. Partial-thickness cartilage loss of the humeral joint with areas of high-grade partial-thickness cartilage loss of the humeral head consistent with mild-moderate osteoarthritis.  Electronically Signed   By: Kathreen Devoid M.D.   On: 01/07/2022 10:40   DG FLUORO GUIDED NEEDLE PLC ASPIRATION/INJECTION LOC  Result Date: 01/05/2022 CLINICAL DATA:  Tendinitis of LEFT rotator cuff, LEFT shoulder pain for a few months, denies injury. Patient unable to have MR due to pacemaker EXAM: LEFT SHOULDER INJECTION UNDER FLUOROSCOPY TECHNIQUE: Procedure, risks, benefits and alternatives explained to the patient. Patient's questions answered. Written informed consent obtained. Timeout protocol followed. LEFT shoulder joint localized by fluoroscopy. Skin prepped and draped in usual sterile fashion. Skin and soft tissues anesthetized with 4 mL of 1% lidocaine. Under fluoroscopic guidance, 22 gauge spinal needle was advanced into LEFT shoulder joint. 10 mL of a solution consisting of 5 mL sterile saline and 15 mL Omnipaque-180 was injected into LEFT shoulder joint without difficulty. Procedure tolerated well by patient without immediate complication. FLUOROSCOPY: Radiation Exposure Index (as provided by the fluoroscopic device): 1.6 mGy FINDINGS: As above IMPRESSION: Technically successful  LEFT shoulder joint injection for CT arthrography. Electronically Signed   By: Lavonia Dana M.D.   On: 01/05/2022 15:59    Assessment & Plan:   Jessica Woodward was seen today for anemia.  Diagnoses and all orders for this visit:  Deficiency anemia- Will evaluate for vitamin deficiencies. -     Vitamin B12; Future -     CBC with Differential/Platelet; Future -     Reticulocytes; Future -     Folate; Future -     Vitamin B1; Future -     Zinc; Future -     IBC + Ferritin; Future -     IBC + Ferritin -     Vitamin B12 -     CBC with Differential/Platelet -     Reticulocytes -     Folate -     Zinc -     Vitamin B1  Vitamin B12 deficiency anemia due to intrinsic factor deficiency  Iron deficiency anemia secondary to inadequate dietary iron intake- Will treat with an iron supplement. -     Ferric Maltol  (ACCRUFER) 30 MG CAPS; Take 1 capsule by mouth in the morning and at bedtime.  Thrombocytosis- Will start an iron supplement.  Anemia due to zinc deficiency -     zinc gluconate 50 MG tablet; Take 1 tablet (50 mg total) by mouth daily.   I am having Jessica Woodward. Jessica "Sandy" start on ACCRUFeR and zinc gluconate. I am also having Jessica Woodward maintain Jessica Woodward triamcinolone cream, multivitamin, loratadine, acetaminophen, irbesartan, atorvastatin, and meloxicam.  Meds ordered this encounter  Medications   Ferric Maltol (ACCRUFER) 30 MG CAPS    Sig: Take 1 capsule by mouth in the morning and at bedtime.    Dispense:  180 capsule    Refill:  1   zinc gluconate 50 MG tablet    Sig: Take 1 tablet (50 mg total) by mouth daily.    Dispense:  90 tablet    Refill:  1     Follow-up: No follow-ups on file.  Scarlette Calico, MD

## 2022-03-05 DIAGNOSIS — D75839 Thrombocytosis, unspecified: Secondary | ICD-10-CM | POA: Insufficient documentation

## 2022-03-05 DIAGNOSIS — D508 Other iron deficiency anemias: Secondary | ICD-10-CM | POA: Insufficient documentation

## 2022-03-05 MED ORDER — ACCRUFER 30 MG PO CAPS: 1.0000 | ORAL_CAPSULE | Freq: Two times a day (BID) | ORAL | 1 refills | Status: DC

## 2022-03-06 LAB — RETICULOCYTES
ABS Retic: 59520 cells/uL (ref 20000–80000)
Retic Ct Pct: 1.6 %

## 2022-03-06 LAB — ZINC: Zinc: 58 ug/dL — ABNORMAL LOW (ref 60–130)

## 2022-03-07 DIAGNOSIS — D538 Other specified nutritional anemias: Secondary | ICD-10-CM | POA: Insufficient documentation

## 2022-03-07 MED ORDER — ZINC GLUCONATE 50 MG PO TABS: 50.0000 mg | ORAL_TABLET | Freq: Every day | ORAL | 1 refills | Status: DC

## 2022-03-08 ENCOUNTER — Encounter: Payer: Self-pay | Admitting: Internal Medicine

## 2022-03-09 LAB — VITAMIN B1: Vitamin B1 (Thiamine): 17 nmol/L (ref 8–30)

## 2022-03-11 ENCOUNTER — Ambulatory Visit (INDEPENDENT_AMBULATORY_CARE_PROVIDER_SITE_OTHER): Payer: Medicare Other

## 2022-03-11 DIAGNOSIS — I442 Atrioventricular block, complete: Secondary | ICD-10-CM

## 2022-03-11 LAB — CUP PACEART REMOTE DEVICE CHECK
Battery Remaining Longevity: 113 mo
Battery Voltage: 3.01 V
Brady Statistic AP VP Percent: 8.63 %
Brady Statistic AP VS Percent: 0 %
Brady Statistic AS VP Percent: 91.36 %
Brady Statistic AS VS Percent: 0.01 %
Brady Statistic RA Percent Paced: 8.62 %
Brady Statistic RV Percent Paced: 99.99 %
Date Time Interrogation Session: 20231019072032
Implantable Lead Implant Date: 20210720
Implantable Lead Implant Date: 20210720
Implantable Lead Location: 753859
Implantable Lead Location: 753860
Implantable Lead Model: 3830
Implantable Lead Model: 5076
Implantable Pulse Generator Implant Date: 20210720
Lead Channel Impedance Value: 266 Ohm
Lead Channel Impedance Value: 380 Ohm
Lead Channel Impedance Value: 418 Ohm
Lead Channel Impedance Value: 513 Ohm
Lead Channel Pacing Threshold Amplitude: 0.625 V
Lead Channel Pacing Threshold Amplitude: 0.75 V
Lead Channel Pacing Threshold Pulse Width: 0.4 ms
Lead Channel Pacing Threshold Pulse Width: 0.4 ms
Lead Channel Sensing Intrinsic Amplitude: 1.375 mV
Lead Channel Sensing Intrinsic Amplitude: 1.375 mV
Lead Channel Sensing Intrinsic Amplitude: 8.875 mV
Lead Channel Sensing Intrinsic Amplitude: 8.875 mV
Lead Channel Setting Pacing Amplitude: 1.5 V
Lead Channel Setting Pacing Amplitude: 2.5 V
Lead Channel Setting Pacing Pulse Width: 0.4 ms
Lead Channel Setting Sensing Sensitivity: 2 mV

## 2022-03-13 ENCOUNTER — Other Ambulatory Visit: Payer: Self-pay | Admitting: Internal Medicine

## 2022-03-13 DIAGNOSIS — I10 Essential (primary) hypertension: Secondary | ICD-10-CM

## 2022-03-13 DIAGNOSIS — E785 Hyperlipidemia, unspecified: Secondary | ICD-10-CM

## 2022-03-13 DIAGNOSIS — I700 Atherosclerosis of aorta: Secondary | ICD-10-CM

## 2022-03-17 DIAGNOSIS — H2513 Age-related nuclear cataract, bilateral: Secondary | ICD-10-CM | POA: Diagnosis not present

## 2022-03-17 DIAGNOSIS — H43811 Vitreous degeneration, right eye: Secondary | ICD-10-CM | POA: Diagnosis not present

## 2022-03-17 DIAGNOSIS — H04123 Dry eye syndrome of bilateral lacrimal glands: Secondary | ICD-10-CM | POA: Diagnosis not present

## 2022-03-23 NOTE — Progress Notes (Signed)
Remote pacemaker transmission.   

## 2022-03-25 NOTE — Progress Notes (Signed)
Subjective:   Jessica Woodward is a 81 y.o. female who presents for Medicare Annual (Subsequent) preventive examination. I connected with  [name] on 03/26/22 by a audio enabled telemedicine application and verified that I am speaking with the correct person using two identifiers.  Patient Location: Home  Provider Location: Home Office  I discussed the limitations of evaluation and management by telemedicine. The patient expressed understanding and agreed to proceed.  Review of Systems    Deferred to PCP Cardiac Risk Factors include: advanced age (>50mn, >>75women);dyslipidemia;hypertension     Objective:    Today's Vitals   03/26/22 1534  PainSc: 2    There is no height or weight on file to calculate BMI.     03/26/2022    3:43 PM 10/08/2021    7:33 PM 03/24/2021    9:30 AM 04/24/2020    1:43 AM 12/15/2019    5:21 AM 12/14/2019    4:19 PM 12/12/2019    1:00 AM  Advanced Directives  Does Patient Have a Medical Advance Directive? Yes No Yes Yes  Yes Yes  Type of [place] will  Living will;Healthcare Power of [place] will Healthcare Power of [place] will   Does patient want to make changes to medical advance directive? No - Patient declined  No - Patient declined  No - Patient declined  No - Patient declined  Copy of [place] Chart? No - copy requested  No - copy requested      Would patient like information on creating a medical advance directive?  No - Patient declined         Current Medications (verified) Outpatient Encounter Medications as of 03/26/2022  Medication Sig   acetaminophen (TYLENOL) 325 MG tablet Take 1-2 tablets (325-650 mg total) by mouth every 4 (four) hours as needed for mild pain.   atorvastatin (LIPITOR) 40 MG tablet TAKE 1 TABLET BY MOUTH DAILY   Ferric Maltol (ACCRUFER) 30 MG CAPS Take 1 capsule by mouth in  the morning and at bedtime.   irbesartan (AVAPRO) 75 MG tablet TAKE 1 TABLET BY MOUTH DAILY   loratadine (CLARITIN) 10 MG tablet Take 10 mg by mouth daily.   meloxicam (MOBIC) 7.5 MG tablet Take 1 tablet (7.5 mg total) by mouth daily.   Multiple Vitamin (MULTIVITAMIN) tablet Take 1 tablet by mouth daily.   triamcinolone cream (KENALOG) 0.1 % Apply 1 application topically 4 (four) times daily as needed (rash).    zinc gluconate 50 MG tablet Take 1 tablet (50 mg total) by mouth daily.   No facility-administered encounter medications on file as of 03/26/2022.    Allergies (verified) Anastrozole, Lisinopril, Sulfasalazine, Sulfonamide derivatives, and Lactose intolerance (gi)   History: Past Medical History:  Diagnosis Date   Arthritis    Breast disorder    cancer   CARCINOMA, SQUAMOUS CELL, HX OF 06/05/2007   DUCTAL CARCINOMA IN SITU, LEFT BREAST 2002   s/p left lumpectomy, xrt and tamoxifen   History of chemotherapy    Hyperlipemia    Hypertension    Personal history of chemotherapy 2002   Personal history of radiation therapy 2002   RADIATION THERAPY, HX OF    Seasonal allergies    Skin cancer    Past Surgical History:  Procedure Laterality Date   ABDOMINAL HYSTERECTOMY     athroscopy right knee  2013   torn miniscus  BREAST BIOPSY Left 2002   BREAST EXCISIONAL BIOPSY Right    BREAST LUMPECTOMY Left 2002   had chemo and radiation   BREAST SURGERY     Left breast lumpectomy with axillary sential node   DILATION AND CURETTAGE OF UTERUS     Left knee surgery     Dr Hart Robinsons   MOHS SURGERY     (R) arm   PACEMAKER IMPLANT N/A 12/11/2019   Procedure: PACEMAKER IMPLANT;  Surgeon: Evans Lance, MD;  Location: [facility];  Service: Cardiovascular;  Laterality: N/A;   right breast lupectomy     TONSILLECTOMY AND ADENOIDECTOMY     TUBAL LIGATION     Family History  Problem Relation Age of Onset   Coronary artery disease Mother    Dementia Mother         Died at age 64   Thyroid disease Mother        hypothyroid   Macular degeneration Father    Coronary artery disease Father    Diabetes Father    Cancer Brother        pancreatic   Heart disease Maternal Grandmother    Kidney disease Maternal Grandmother    Heart disease Maternal Grandfather    Heart disease Paternal Grandmother    Diabetes Paternal Grandmother    Heart disease Paternal Grandfather    Hypertension Son    Heart attack Son    Hypertension Son    Social History   Socioeconomic History   Marital status: Widowed    Spouse name: Not on file   Number of children: 2   Years of education: Not on file   Highest education level: Not on file  Occupational History   Occupation: retired  Tobacco Use   Smoking status: Former    Types: Cigarettes    Quit date: 12/28/1963    Years since quitting: 58.2   Smokeless tobacco: Never  Vaping Use   Vaping Use: Never used  Substance and Sexual Activity   Alcohol use: Yes    Comment: rare glass of Torie Priebe   Drug use: No   Sexual activity: Not Currently    Partners: Male    Birth control/protection: Surgical    Comment: hyst  Other Topics Concern   Not on file  Social History Narrative   HSG, [employer]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 married '60. 2 boys - '62, '73; 11 grandchildren; 3 great-grandchildren. Retired.   Social Determinants of Health   Financial Resource Strain: Low Risk  (03/26/2022)   Overall Financial Resource Strain (CARDIA)    Difficulty of Paying Living Expenses: Not hard at all  Food Insecurity: No Food Insecurity (03/26/2022)   Hunger Vital Sign    Worried About Running Out of Food in the Last Year: Never true    Ran Out of Food in the Last Year: Never true  Transportation Needs: No Transportation Needs (03/26/2022)   PRAPARE - Hydrologist (Medical): No    Lack of Transportation (Non-Medical): No  Physical Activity: Sufficiently Active (03/26/2022)   Exercise Vital Sign     Days of Exercise per Week: 6 days    Minutes of Exercise per Session: 30 min  Stress: No Stress Concern Present (03/26/2022)   [place]    Feeling of Stress : Not at all  Social Connections: Moderately Integrated (03/26/2022)   Social Connection and Isolation Panel [NHANES]  Frequency of Communication with Friends and Family: More than three times a week    Frequency of Social Gatherings with Friends and Family: More than three times a week    Attends Religious Services: More than 4 times per year    Active Member of [employer] or Organizations: Yes    Attends Archivist Meetings: More than 4 times per year    Marital Status: Widowed    Tobacco Counseling Counseling given: Not Answered   Clinical Intake:  Pre-visit preparation completed: Yes  Pain : 0-10 Pain Score: 2  Pain Type: Chronic pain Pain Location: Shoulder Pain Orientation: Left Pain Descriptors / Indicators: Aching, Discomfort Pain Relieving Factors: tylenol  Pain Relieving Factors: tylenol  Nutritional Status: BMI 25 -29 Overweight Nutritional Risks: None  How often do you need to have someone help you when you read instructions, pamphlets, or other written materials from your doctor or pharmacy?: 1 - Never  Diabetic?No  Interpreter Needed?: No  Information entered by :: Emelia Loron RN   Activities of Daily Living    03/26/2022    3:42 PM  In your present state of health, do you have any difficulty performing the following activities:  Hearing? 1  Comment has hearing aids  Vision? 0  Difficulty concentrating or making decisions? 0  Walking or climbing stairs? 0  Dressing or bathing? 0  Doing errands, shopping? 0  Preparing Food and eating ? N  Using the Toilet? N  In the past six months, have you accidently leaked urine? N  Do you have problems with loss of bowel control? N  Managing your Medications? N  Managing your  Finances? N  Housekeeping or managing your Housekeeping? N    Patient Care Team: Janith Lima, MD as PCP - General (Internal Medicine) Estill Dooms, NP as PCP - OBGYN (Obstetrics and Gynecology) Evans Lance, MD as PCP - Electrophysiology (Cardiology) Magrinat, Virgie Dad, MD (Inactive) (Hematology and Oncology) Sydnee Cabal, MD (Orthopedic Surgery) Rolm Bookbinder, MD (Dermatology) Warden Fillers, MD as Consulting Physician (Ophthalmology) Warden Fillers, MD as Consulting Physician (Ophthalmology)  Indicate any recent Medical Services you may have received from other than [facility] providers in the past year (date may be approximate).     Assessment:   This is a routine wellness examination for Jassmine.  Hearing/Vision screen No results found.  Dietary issues and exercise activities discussed: Current Exercise Habits: Home exercise routine, Type of exercise: walking, Time (Minutes): 30, Frequency (Times/Week): 6, Weekly Exercise (Minutes/Week): 180, Intensity: Mild, Exercise limited by: None identified   Goals Addressed             This Visit's Progress    Patient Stated       Continue to stay physically and socially active.      Depression Screen    03/26/2022    3:47 PM 10/09/2021    3:58 PM 03/24/2021    9:45 AM 01/21/2021    8:49 AM 11/12/2019   10:47 AM 06/11/2019   10:14 AM 10/10/2018    9:23 AM  PHQ 2/9 Scores  PHQ - 2 Score 0 0 0 0 0 0 0  PHQ- 9 Score  2         Fall Risk    03/26/2022    3:46 PM 10/09/2021    3:58 PM 03/24/2021    9:31 AM 01/21/2021    8:48 AM 11/12/2019   10:47 AM  Fall Risk   Falls in the past year? 0  0 0 0 0  Number falls in past yr: 0 0 0  0  Injury with Fall? 0 0 0  0  Risk for fall due to : No Fall Risks  No Fall Risks  No Fall Risks  Follow up Falls evaluation completed  Falls evaluation completed  Falls evaluation completed    [place]:  Any stairs in or around the home? Yes   If so, are there any without handrails? Yes  Home free of loose throw rugs in walkways, pet beds, electrical cords, etc? Yes  Adequate lighting in your home to reduce risk of falls? Yes   ASSISTIVE DEVICES UTILIZED TO PREVENT FALLS:  Life alert? No  Use of a cane, walker or w/c? No  Grab bars in the bathroom? No  Shower chair or bench in shower? No  Elevated toilet seat or a handicapped toilet? No   Cognitive Function:        03/26/2022    3:43 PM 11/12/2019   10:48 AM  6CIT Screen  What Year? 0 points 0 points  What month? 0 points 0 points  What time? 0 points 0 points  Count back from 20 0 points 0 points  Months in reverse 0 points 0 points  Repeat phrase 0 points 0 points  Total Score 0 points 0 points    Immunizations Immunization History  Administered Date(s) Administered   Fluad Quad(high Dose 65+) 02/14/2019, 01/21/2021, 02/22/2022   Influenza Whole 03/17/2009, 02/28/2012   Influenza, High Dose Seasonal PF 02/17/2015, 02/12/2016, 02/01/2017, 01/31/2018   Influenza,inj,Quad PF,6+ Mos 02/27/2013, 03/20/2014   Influenza-Unspecified 02/17/2015   PFIZER(Purple Top)SARS-COV-2 Vaccination 06/07/2019, 06/26/2019, 02/07/2020, 09/04/2020, 03/13/2021   Pneumococcal Conjugate-13 03/28/2013   Pneumococcal Polysaccharide-23 08/23/2004, 01/07/2010, 04/15/2015   Td 07/08/1998, 01/07/2010   Tdap 03/06/2020   Zoster Recombinat (Shingrix) 11/05/2020, 02/27/2021   Zoster, Live 03/14/2006    TDAP status: Up to date  Flu Vaccine status: Up to date  Pneumococcal vaccine status: Up to date  Covid-19 vaccine status: Information provided on how to obtain vaccines.   Qualifies for Shingles Vaccine? Yes   Zostavax completed No   Shingrix Completed?: Yes  Screening Tests Health Maintenance  Topic Date Due   Medicare Annual Wellness (AWV)  03/27/2023   TETANUS/TDAP  03/06/2030   Pneumonia Vaccine 85+ Years old  Completed   INFLUENZA VACCINE  Completed   DEXA SCAN   Completed   Zoster Vaccines- Shingrix  Completed   HPV VACCINES  Aged Out   COVID-19 Vaccine  Discontinued    Health Maintenance  There are no preventive care reminders to display for this patient.   Colorectal cancer screening: No longer required.   Mammogram status: Completed 08/26/21. Repeat every year  Bone Density status: Completed 01/31/18. Results reflect: Bone density results: NORMAL. Repeat every never years.  Lung Cancer Screening: (Low Dose CT Chest recommended if Age 52-80 years, 30 pack-year currently smoking OR have quit w/in 15years.) does not qualify.   Additional Screening:  Hepatitis C Screening: does qualify; Completed education provided  Vision Screening: Recommended annual ophthalmology exams for early detection of glaucoma and other disorders of the eye. Is the patient up to date with their annual eye exam?  Yes  Who is the provider or what is the name of the office in which the patient attends annual eye exams? Dr. Katy Fitch If pt is not established with a provider, would they like to be referred to a provider to establish care?  N/A .   Dental Screening: Recommended annual dental exams for proper oral hygiene  Community Resource Referral / Chronic Care Management: CRR required this visit?  No   CCM required this visit?  No      Plan:     I have personally reviewed and noted the following in the patient's chart:   Medical and social history Use of alcohol, tobacco or illicit drugs  Current medications and supplements including opioid prescriptions. Patient is not currently taking opioid prescriptions. Functional ability and status Nutritional status Physical activity Advanced directives List of other physicians Hospitalizations, surgeries, and ER visits in previous 12 months Vitals Screenings to include cognitive, depression, and falls Referrals and appointments  In addition, I have reviewed and discussed with patient certain preventive  protocols, quality metrics, and best practice recommendations. A written personalized care plan for preventive services as well as general preventive health recommendations were provided to patient.     Michiel Cowboy, RN   03/26/2022   Nurse Notes:  Ms. Struthers , Thank you for taking time to come for your Medicare Wellness Visit. I appreciate your ongoing commitment to your health goals. Please review the following plan we discussed and let me know if I can assist you in the future.   These are the goals we discussed:  Goals       Patient Stated     Patient Stated (pt-stated)      Continue to maintain her weight and be more active.      Patient Stated (pt-stated)      To maintain my current health status by continuing to eat healthy, stay physically active and socially active.      Other     patient      Continue to travel;  Small motor home and will take off       Patient Stated      Maintain current health status, enjoy life and family. Travel in our motor home as much as possible.      Patient Stated      Continue to stay physically and socially active.        This is a list of the screening recommended for you and due dates:  Health Maintenance  Topic Date Due   Medicare Annual Wellness Visit  03/27/2023   Tetanus Vaccine  03/06/2030   Pneumonia Vaccine  Completed   Flu Shot  Completed   DEXA scan (bone density measurement)  Completed   Zoster (Shingles) Vaccine  Completed   HPV Vaccine  Aged Out   COVID-19 Vaccine  Discontinued

## 2022-03-25 NOTE — Patient Instructions (Signed)

## 2022-03-26 ENCOUNTER — Ambulatory Visit (INDEPENDENT_AMBULATORY_CARE_PROVIDER_SITE_OTHER): Payer: Medicare Other | Admitting: *Deleted

## 2022-03-26 DIAGNOSIS — Z0000 Encounter for general adult medical examination without abnormal findings: Secondary | ICD-10-CM

## 2022-04-29 ENCOUNTER — Ambulatory Visit: Payer: Medicare Other | Admitting: Internal Medicine

## 2022-05-10 ENCOUNTER — Other Ambulatory Visit: Payer: Self-pay

## 2022-05-10 ENCOUNTER — Emergency Department (HOSPITAL_COMMUNITY)
Admission: EM | Admit: 2022-05-10 | Discharge: 2022-05-10 | Disposition: A | Discharge status: Home / Self Care | Payer: Medicare Other | Attending: Emergency Medicine | Admitting: Emergency Medicine

## 2022-05-10 ENCOUNTER — Emergency Department (HOSPITAL_COMMUNITY): Payer: Medicare Other

## 2022-05-10 ENCOUNTER — Encounter (HOSPITAL_COMMUNITY): Payer: Self-pay | Admitting: Emergency Medicine

## 2022-05-10 DIAGNOSIS — R079 Chest pain, unspecified: Secondary | ICD-10-CM | POA: Diagnosis not present

## 2022-05-10 DIAGNOSIS — I10 Essential (primary) hypertension: Secondary | ICD-10-CM | POA: Diagnosis not present

## 2022-05-10 DIAGNOSIS — R202 Paresthesia of skin: Secondary | ICD-10-CM | POA: Diagnosis not present

## 2022-05-10 DIAGNOSIS — M79602 Pain in left arm: Secondary | ICD-10-CM | POA: Insufficient documentation

## 2022-05-10 LAB — CBC
HCT: 42.6 % (ref 36.0–46.0)
Hemoglobin: 13.8 g/dL (ref 12.0–15.0)
MCH: 30.5 pg (ref 26.0–34.0)
MCHC: 32.4 g/dL (ref 30.0–36.0)
MCV: 94 fL (ref 80.0–100.0)
Platelets: 314 10*3/uL (ref 150–400)
RBC: 4.53 MIL/uL (ref 3.87–5.11)
RDW: 13.7 % (ref 11.5–15.5)
WBC: 7 10*3/uL (ref 4.0–10.5)
nRBC: 0 % (ref 0.0–0.2)

## 2022-05-10 LAB — BASIC METABOLIC PANEL
Anion gap: 7 (ref 5–15)
BUN: 19 mg/dL (ref 8–23)
CO2: 25 mmol/L (ref 22–32)
Calcium: 9.7 mg/dL (ref 8.9–10.3)
Chloride: 108 mmol/L (ref 98–111)
Creatinine, Ser: 0.82 mg/dL (ref 0.44–1.00)
GFR, Estimated: >60 mL/min (ref >60)
Glucose, Bld: 102 mg/dL — ABNORMAL HIGH (ref 70–99)
Potassium: 4 mmol/L (ref 3.5–5.1)
Sodium: 140 mmol/L (ref 135–145)

## 2022-05-10 LAB — TROPONIN I (HIGH SENSITIVITY)
Troponin I (High Sensitivity): 4 ng/L (ref <18)
Troponin I (High Sensitivity): 4 ng/L (ref <18)

## 2022-05-10 NOTE — Discharge Instructions (Signed)
You were seen in the emergency department for left arm pain.  You had lab work chest x-ray and EKG that did not show any evidence of heart injury.  This is possibly a tendinitis or even symptoms from your neck.  Use Tylenol for pain.  Follow-up with your regular doctor.  Return to the emergency department if any worsening or concerning symptoms

## 2022-05-10 NOTE — ED Triage Notes (Signed)
Pt c/o left arm pain since 1700. Pt denies any injury to the area.

## 2022-05-10 NOTE — ED Notes (Signed)
Pt unhooked self from monitor. Came out to hall. Updated vs not taken.Jessica Woodward over dc papers. All questions answered

## 2022-05-10 NOTE — ED Provider Notes (Signed)
[facility] EMERGENCY DEPARTMENT Provider Note   CSN: [id number] Arrival date & time: 05/10/22  1847     History {Add pertinent medical, surgical, social history, OB history to HPI:1} Chief Complaint  Patient presents with   Arm Pain    Jessica Woodward is a 81 y.o. female.  Is here with complaint of some left arm pain and some tingling in her fingers that started around 5 PM tonight.  She said she was not doing anything exertional.  She denies having had it before.  No known trauma.  She was concerned it could be a cardiac complaint and so wanted to get evaluated.  She has no history of cardiac disease.  She said she had an abnormal EKG and had a Myoview which was fine.  She denies any chest pain or shortness of breath no headache blurry vision double vision.  The history is provided by the patient.  Arm Pain This is a new problem. The current episode started 3 to 5 hours ago. The problem occurs constantly. The problem has not changed since onset.Pertinent negatives include no chest pain, no abdominal pain, no headaches and no shortness of breath. Nothing aggravates the symptoms. Nothing relieves the symptoms. She has tried nothing for the symptoms. The treatment provided no relief.       Home Medications Prior to Admission medications   Medication Sig Start Date End Date Taking? Authorizing Provider  acetaminophen (TYLENOL) 325 MG tablet Take 1-2 tablets (325-650 mg total) by mouth every 4 (four) hours as needed for mild pain. 12/12/19   Shirley Friar, PA-C  atorvastatin (LIPITOR) 40 MG tablet TAKE 1 TABLET BY MOUTH DAILY 03/13/22   Janith Lima, MD  Ferric Maltol (ACCRUFER) 30 MG CAPS Take 1 capsule by mouth in the morning and at bedtime. 03/05/22   Janith Lima, MD  irbesartan (AVAPRO) 75 MG tablet TAKE 1 TABLET BY MOUTH DAILY 03/13/22   Janith Lima, MD  loratadine (CLARITIN) 10 MG tablet Take 10 mg by mouth daily.    [provider]  meloxicam (MOBIC)  7.5 MG tablet Take 1 tablet (7.5 mg total) by mouth daily. 01/04/22   Janith Lima, MD  Multiple Vitamin (MULTIVITAMIN) tablet Take 1 tablet by mouth daily.    [provider]  triamcinolone cream (KENALOG) 0.1 % Apply 1 application topically 4 (four) times daily as needed (rash).  09/09/16   [provider]  zinc gluconate 50 MG tablet Take 1 tablet (50 mg total) by mouth daily. 03/07/22   Janith Lima, MD      Allergies    Anastrozole, Lisinopril, Sulfasalazine, Sulfonamide derivatives, and Lactose intolerance (gi)    Review of Systems   Review of Systems  Constitutional:  Negative for fever.  Eyes:  Negative for visual disturbance.  Respiratory:  Negative for shortness of breath.   Cardiovascular:  Negative for chest pain.  Gastrointestinal:  Negative for abdominal pain.  Musculoskeletal:  Negative for neck pain.  Skin:  Negative for rash.  Neurological:  Positive for numbness. Negative for weakness and headaches.    Physical Exam Updated Vital Signs BP (!) 158/84   Pulse 68   Temp 98.2 F (36.8 C) (Oral)   Resp 11   Ht '5\' 4"'$  (1.626 m)   Wt 65.8 kg   SpO2 99%   BMI 24.89 kg/m  Physical Exam Vitals and nursing note reviewed.  Constitutional:      General: She is not in acute distress.  Appearance: Normal appearance. She is well-developed.  HENT:     Head: Normocephalic and atraumatic.  Eyes:     Conjunctiva/sclera: Conjunctivae normal.  Cardiovascular:     Rate and Rhythm: Normal rate and regular rhythm.     Heart sounds: No murmur heard. Pulmonary:     Effort: Pulmonary effort is normal. No respiratory distress.     Breath sounds: Normal breath sounds.  Abdominal:     Palpations: Abdomen is soft.     Tenderness: There is no abdominal tenderness.  Musculoskeletal:        General: No tenderness or deformity. Normal range of motion.     Cervical back: Neck supple.     Comments: Left arm normal distal pulses motor and sensation.  Full range  of motion without any significant discomfort no deformities.  Compartments are soft.  No neck tenderness.  Radial pulse 2+.  Skin:    General: Skin is warm and dry.     Capillary Refill: Capillary refill takes less than 2 seconds.  Neurological:     General: No focal deficit present.     Mental Status: She is alert.     Motor: No weakness.     ED Results / Procedures / Treatments   Labs (all labs ordered are listed, but only abnormal results are displayed) Labs Reviewed  CBC  BASIC METABOLIC PANEL  TROPONIN I (HIGH SENSITIVITY)  TROPONIN I (HIGH SENSITIVITY)    EKG EKG Interpretation  Date/Time:  Monday May 10 2022 19:47:03 EST Ventricular Rate:  72 PR Interval:  154 QRS Duration: 144 QT Interval:  442 QTC Calculation: 483 R Axis:   -71 Text Interpretation: Atrial-sensed ventricular-paced rhythm Abnormal ECG When compared with ECG of 26-Feb-2022 12:36, Electronic ventricular pacemaker has replaced Sinus rhythm Confirmed by Aletta Edouard [id number]) on 05/10/2022 7:49:16 PM  Radiology DG Chest 2 View  Result Date: 05/10/2022 CLINICAL DATA:  Left arm pain EXAM: CHEST - 2 VIEW COMPARISON:  10/09/2021 FINDINGS: Left chest multi lead pacer. The heart size and mediastinal contours are within normal limits. Disc degenerative disease of the thoracic spine. IMPRESSION: No acute abnormality of the lungs. Electronically Signed   By: Delanna Ahmadi M.D.   On: 05/10/2022 20:19    Procedures Procedures  {Document cardiac monitor, telemetry assessment procedure when appropriate:1}  Medications Ordered in ED Medications - No data to display  ED Course/ Medical Decision Making/ A&P                           Medical Decision Making Amount and/or Complexity of Data Reviewed Labs: ordered. Radiology: ordered.   This patient complains of ***; this involves an extensive number of treatment Options and is a complaint that carries with it a high risk of complications and morbidity.  The differential includes ***  I ordered, reviewed and interpreted labs, which included *** I ordered medication *** and reviewed PMP when indicated. I ordered imaging studies which included *** and I independently    visualized and interpreted imaging which showed *** Additional history obtained from *** Previous records obtained and reviewed *** I consulted *** and discussed lab and imaging findings and discussed disposition.  Cardiac monitoring reviewed, *** Social determinants considered, *** Critical Interventions: ***  After the interventions stated above, I reevaluated the patient and found *** Admission and further testing considered, ***   {Document critical care time when appropriate:1} {Document review of labs and clinical decision tools ie heart score,  Chads2Vasc2 etc:1}  {Document your independent review of radiology images, and any outside records:1} {Document your discussion with family members, caretakers, and with consultants:1} {Document social determinants of health affecting pt's care:1} {Document your decision making why or why not admission, treatments were needed:1} Final Clinical Impression(s) / ED Diagnoses Final diagnoses:  None    Rx / DC Orders ED Discharge Orders     None

## 2022-05-11 ENCOUNTER — Encounter: Payer: Self-pay | Admitting: Internal Medicine

## 2022-05-12 ENCOUNTER — Other Ambulatory Visit: Payer: Self-pay | Admitting: Internal Medicine

## 2022-05-18 ENCOUNTER — Telehealth: Payer: Self-pay

## 2022-05-18 NOTE — Telephone Encounter (Signed)
     Patient  visit at [facility]  on 12/18   Have you been able to follow up with your primary care physician? Yes   The patient was or was not able to obtain any needed medicine or equipment. Yes   Are there diet recommendations that you are having difficulty following? No   Patient expresses understanding of discharge instructions and education provided has no other needs at this time.  Yes      [place], [facility], Care Management  [phone] [address], [place], [place] [address] Phone: [phone] Email: [email or web address]

## 2022-05-20 ENCOUNTER — Ambulatory Visit (INDEPENDENT_AMBULATORY_CARE_PROVIDER_SITE_OTHER): Payer: Medicare Other | Admitting: Internal Medicine

## 2022-05-20 ENCOUNTER — Encounter: Payer: Self-pay | Admitting: Internal Medicine

## 2022-05-20 VITALS — BP 160/86 | HR 77 | Temp 97.8°F | Ht 64.0 in | Wt 147.0 lb

## 2022-05-20 DIAGNOSIS — R739 Hyperglycemia, unspecified: Secondary | ICD-10-CM

## 2022-05-20 DIAGNOSIS — I10 Essential (primary) hypertension: Secondary | ICD-10-CM | POA: Diagnosis not present

## 2022-05-20 DIAGNOSIS — E038 Other specified hypothyroidism: Secondary | ICD-10-CM

## 2022-05-20 MED ORDER — IRBESARTAN-HYDROCHLOROTHIAZIDE 300-12.5 MG PO TABS: 1.0000 | ORAL_TABLET | Freq: Every day | ORAL | 0 refills | Status: DC

## 2022-05-20 NOTE — Patient Instructions (Signed)
Ok to change your BP medication to Avalide 300 - 12.5 mg per day  Please continue all other medications as before, and refills have been done if requested.  Please have the pharmacy call with any other refills you may need.  Please keep your appointments with your specialists as you may have planned  Please see Dr Ronnald Ramp as you mentioned next week

## 2022-05-20 NOTE — Assessment & Plan Note (Signed)
Lab Results  Component Value Date   HGBA1C 5.8 10/28/2021   Stable, pt to continue current medical treatment  - diet, wt control, excercise

## 2022-05-20 NOTE — Progress Notes (Signed)
Patient ID: Jessica Woodward, female   DOB: 06-05-1940, 81 y.o.   MRN: [id number]        Chief Complaint: follow up HTN, hyperglycemia, low thyroid       HPI:  Jessica Woodward is a 81 y.o. female here with c/o pewrsistent mild to mod elevated SBP at home in the past 2 weeks for no apparent reason, except that medications have been reduced from higher dose avalide to 75 mg avapro alone due to lower Bps.  Unfortnately, now BP consistently elevated again.  Pt denies chest pain, increased sob or doe, wheezing, orthopnea, PND, increased LE swelling, palpitations, dizziness or syncope.   Pt denies polydipsia, polyuria, or new focal neuro s/s.  Pt denies fever, wt loss, night sweats, loss of appetite, or other constitutional symptoms  Pt would be more in favor of return to avalide rather than increased avapro plus new amlodipine.   Pt denies polydipsia, polyuria, or new focal neuro s/s.    Pt denies fever, wt loss, night sweats, loss of appetite, or other constitutional symptoms  Denies hyper or hypo thyroid symptoms such as voice, skin or hair change.  Wt Readings from Last 3 Encounters:  05/20/22 147 lb (66.7 kg)  05/10/22 145 lb (65.8 kg)  03/04/22 150 lb (68 kg)   BP Readings from Last 3 Encounters:  05/20/22 (!) 160/86  05/10/22 (!) 154/79  03/04/22 138/84         Past Medical History:  Diagnosis Date   Arthritis    Breast disorder    cancer   CARCINOMA, SQUAMOUS CELL, HX OF 06/05/2007   DUCTAL CARCINOMA IN SITU, LEFT BREAST 2002   s/p left lumpectomy, xrt and tamoxifen   History of chemotherapy    Hyperlipemia    Hypertension    Personal history of chemotherapy 2002   Personal history of radiation therapy 2002   RADIATION THERAPY, HX OF    Seasonal allergies    Skin cancer    Past Surgical History:  Procedure Laterality Date   ABDOMINAL HYSTERECTOMY     athroscopy right knee  2013   torn miniscus   BREAST BIOPSY Left 2002   BREAST EXCISIONAL BIOPSY Right    BREAST LUMPECTOMY Left  2002   had chemo and radiation   BREAST SURGERY     Left breast lumpectomy with axillary sential node   DILATION AND CURETTAGE OF UTERUS     Left knee surgery     Dr Hart Robinsons   MOHS SURGERY     (R) arm   PACEMAKER IMPLANT N/A 12/11/2019   Procedure: PACEMAKER IMPLANT;  Surgeon: Evans Lance, MD;  Location: [facility];  Service: Cardiovascular;  Laterality: N/A;   right breast lupectomy     TONSILLECTOMY AND ADENOIDECTOMY     TUBAL LIGATION      reports that she quit smoking about 58 years ago. Her smoking use included cigarettes. She has never used smokeless tobacco. She reports current alcohol use. She reports that she does not use drugs. family history includes Cancer in her brother; Coronary artery disease in her father and mother; Dementia in her mother; Diabetes in her father and paternal grandmother; Heart attack in her son; Heart disease in her maternal grandfather, maternal grandmother, paternal grandfather, and paternal grandmother; Hypertension in her son and son; Kidney disease in her maternal grandmother; Macular degeneration in her father; Thyroid disease in her mother. Allergies  Allergen Reactions   Anastrozole Other (See Comments) and Rash  Causes muscle weakness   Lisinopril     cough   Sulfasalazine Hives   Sulfonamide Derivatives Hives   Lactose Intolerance (Gi) Diarrhea   Current Outpatient Medications on File Prior to Visit  Medication Sig Dispense Refill   acetaminophen (TYLENOL) 325 MG tablet Take 1-2 tablets (325-650 mg total) by mouth every 4 (four) hours as needed for mild pain.     atorvastatin (LIPITOR) 40 MG tablet TAKE 1 TABLET BY MOUTH DAILY 90 tablet 0   Ferric Maltol (ACCRUFER) 30 MG CAPS Take 1 capsule by mouth in the morning and at bedtime. 180 capsule 1   irbesartan (AVAPRO) 75 MG tablet TAKE 1 TABLET BY MOUTH DAILY 90 tablet 0   loratadine (CLARITIN) 10 MG tablet Take 10 mg by mouth daily.     meloxicam (MOBIC) 7.5 MG tablet  Take 1 tablet (7.5 mg total) by mouth daily. 90 tablet 1   Multiple Vitamin (MULTIVITAMIN) tablet Take 1 tablet by mouth daily.     triamcinolone cream (KENALOG) 0.1 % Apply 1 application topically 4 (four) times daily as needed (rash).      zinc gluconate 50 MG tablet Take 1 tablet (50 mg total) by mouth daily. 90 tablet 1   No current facility-administered medications on file prior to visit.        ROS:  All others reviewed and negative.  Objective        PE:  BP (!) 160/86 (BP Location: Left Arm, Patient Position: Sitting, Cuff Size: Normal)   Pulse 77   Temp 97.8 F (36.6 C) (Oral)   Ht '5\' 4"'$  (1.626 m)   Wt 147 lb (66.7 kg)   SpO2 98%   BMI 25.23 kg/m                 Constitutional: Pt appears in NAD               HENT: Head: NCAT.                Right Ear: External ear normal.                 Left Ear: External ear normal.                Eyes: . Pupils are equal, round, and reactive to light. Conjunctivae and EOM are normal               Nose: without d/c or deformity               Neck: Neck supple. Gross normal ROM               Cardiovascular: Normal rate and regular rhythm.                 Pulmonary/Chest: Effort normal and breath sounds without rales or wheezing.                Neurological: Pt is alert. At baseline orientation, motor grossly intact               Skin: Skin is warm. No rashes, no other new lesions, LE edema - none               Psychiatric: Pt behavior is normal without agitation   Micro: none  Cardiac tracings I have personally interpreted today:  none  Pertinent Radiological findings (summarize): none   Lab Results  Component Value Date   WBC 7.0 05/10/2022   HGB 13.8 05/10/2022  HCT 42.6 05/10/2022   PLT 314 05/10/2022   GLUCOSE 102 (H) 05/10/2022   CHOL 160 01/21/2021   TRIG 77.0 01/21/2021   HDL 65.20 01/21/2021   LDLDIRECT 166.9 03/23/2013   LDLCALC 79 01/21/2021   ALT 15 01/21/2021   AST 21 01/21/2021   NA 140 05/10/2022   K  4.0 05/10/2022   CL 108 05/10/2022   CREATININE 0.82 05/10/2022   BUN 19 05/10/2022   CO2 25 05/10/2022   TSH 2.57 02/22/2022   HGBA1C 5.8 10/28/2021   Assessment/Plan:  Jessica Woodward is a 81 y.o. White or Caucasian [1] female with  has a past medical history of Arthritis, Breast disorder, CARCINOMA, SQUAMOUS CELL, HX OF (06/05/2007), DUCTAL CARCINOMA IN SITU, LEFT BREAST (2002), History of chemotherapy, Hyperlipemia, Hypertension, Personal history of chemotherapy (2002), Personal history of radiation therapy (2002), RADIATION THERAPY, HX OF, Seasonal allergies, and Skin cancer.  Chronic hyperglycemia Lab Results  Component Value Date   HGBA1C 5.8 10/28/2021   Stable, pt to continue current medical treatment  - diet, wt control, excercise   Essential hypertension, benign BP Readings from Last 3 Encounters:  05/20/22 (!) 160/86  05/10/22 (!) 154/79  03/04/22 138/84   Uncontrolled, ok for change avapro 75 qd to avalide 300 - 12.5 mg qd, and f/u BP at home as well as 1 wk with PCP   Subclinical hypothyroidism Lab Results  Component Value Date   TSH 2.57 02/22/2022   Stable, pt to continue without medication for now, cont to monitor  Followup: Return in about 1 week (around 05/27/2022) for follow up with Dr Ronnald Ramp.  Cathlean Cower, MD 05/20/2022 1:03 PM [facility]

## 2022-05-20 NOTE — Assessment & Plan Note (Signed)
Lab Results  Component Value Date   TSH 2.57 02/22/2022   Stable, pt to continue without medication for now, cont to monitor

## 2022-05-20 NOTE — Assessment & Plan Note (Signed)
BP Readings from Last 3 Encounters:  05/20/22 (!) 160/86  05/10/22 (!) 154/79  03/04/22 138/84   Uncontrolled, ok for change avapro 75 qd to avalide 300 - 12.5 mg qd, and f/u BP at home as well as 1 wk with PCP

## 2022-05-27 ENCOUNTER — Ambulatory Visit: Payer: Medicare Other | Admitting: Internal Medicine

## 2022-05-27 ENCOUNTER — Ambulatory Visit (INDEPENDENT_AMBULATORY_CARE_PROVIDER_SITE_OTHER): Payer: Medicare Other | Admitting: Internal Medicine

## 2022-05-27 ENCOUNTER — Encounter: Payer: Self-pay | Admitting: Internal Medicine

## 2022-05-27 VITALS — BP 128/78 | HR 77 | Temp 97.6°F | Resp 16 | Ht 64.0 in | Wt 147.0 lb

## 2022-05-27 DIAGNOSIS — I10 Essential (primary) hypertension: Secondary | ICD-10-CM | POA: Diagnosis not present

## 2022-05-27 DIAGNOSIS — D510 Vitamin B12 deficiency anemia due to intrinsic factor deficiency: Secondary | ICD-10-CM | POA: Diagnosis not present

## 2022-05-27 DIAGNOSIS — Z23 Encounter for immunization: Secondary | ICD-10-CM | POA: Diagnosis not present

## 2022-05-27 DIAGNOSIS — Z85828 Personal history of other malignant neoplasm of skin: Secondary | ICD-10-CM | POA: Diagnosis not present

## 2022-05-27 DIAGNOSIS — E785 Hyperlipidemia, unspecified: Secondary | ICD-10-CM

## 2022-05-27 DIAGNOSIS — I700 Atherosclerosis of aorta: Secondary | ICD-10-CM | POA: Diagnosis not present

## 2022-05-27 DIAGNOSIS — L821 Other seborrheic keratosis: Secondary | ICD-10-CM | POA: Diagnosis not present

## 2022-05-27 DIAGNOSIS — L820 Inflamed seborrheic keratosis: Secondary | ICD-10-CM | POA: Diagnosis not present

## 2022-05-27 DIAGNOSIS — M5030 Other cervical disc degeneration, unspecified cervical region: Secondary | ICD-10-CM

## 2022-05-27 DIAGNOSIS — D1801 Hemangioma of skin and subcutaneous tissue: Secondary | ICD-10-CM | POA: Diagnosis not present

## 2022-05-27 DIAGNOSIS — D508 Other iron deficiency anemias: Secondary | ICD-10-CM

## 2022-05-27 DIAGNOSIS — D225 Melanocytic nevi of trunk: Secondary | ICD-10-CM | POA: Diagnosis not present

## 2022-05-27 LAB — CBC WITH DIFFERENTIAL/PLATELET
Basophils Absolute: 0 10*3/uL (ref 0.0–0.1)
Basophils Relative: 0.7 % (ref 0.0–3.0)
Eosinophils Absolute: 0.1 10*3/uL (ref 0.0–0.7)
Eosinophils Relative: 1.7 % (ref 0.0–5.0)
HCT: 41.9 % (ref 36.0–46.0)
Hemoglobin: 14.1 g/dL (ref 12.0–15.0)
Lymphocytes Relative: 22.1 % (ref 12.0–46.0)
Lymphs Abs: 1.6 10*3/uL (ref 0.7–4.0)
MCHC: 33.7 g/dL (ref 30.0–36.0)
MCV: 90.7 fl (ref 78.0–100.0)
Monocytes Absolute: 0.7 10*3/uL (ref 0.1–1.0)
Monocytes Relative: 10.3 % (ref 3.0–12.0)
Neutro Abs: 4.6 10*3/uL (ref 1.4–7.7)
Neutrophils Relative %: 65.2 % (ref 43.0–77.0)
Platelets: 303 10*3/uL (ref 150.0–400.0)
RBC: 4.62 Mil/uL (ref 3.87–5.11)
RDW: 14.5 % (ref 11.5–15.5)
WBC: 7.1 10*3/uL (ref 4.0–10.5)

## 2022-05-27 LAB — BASIC METABOLIC PANEL
BUN: 25 mg/dL — ABNORMAL HIGH (ref 6–23)
CO2: 28 mEq/L (ref 19–32)
Calcium: 10.4 mg/dL (ref 8.4–10.5)
Chloride: 104 mEq/L (ref 96–112)
Creatinine, Ser: 0.81 mg/dL (ref 0.40–1.20)
GFR: 68.32 mL/min (ref >60.00)
Glucose, Bld: 97 mg/dL (ref 70–99)
Potassium: 4.2 mEq/L (ref 3.5–5.1)
Sodium: 140 mEq/L (ref 135–145)

## 2022-05-27 LAB — HEPATIC FUNCTION PANEL
ALT: 14 U/L (ref 0–35)
AST: 20 U/L (ref 0–37)
Albumin: 4.2 g/dL (ref 3.5–5.2)
Alkaline Phosphatase: 92 U/L (ref 39–117)
Bilirubin, Direct: 0.1 mg/dL (ref 0.0–0.3)
Total Bilirubin: 0.7 mg/dL (ref 0.2–1.2)
Total Protein: 7.1 g/dL (ref 6.0–8.3)

## 2022-05-27 LAB — LIPID PANEL
Cholesterol: 188 mg/dL (ref 0–200)
HDL: 69.3 mg/dL (ref >39.00)
LDL Cholesterol: 100 mg/dL — ABNORMAL HIGH (ref 0–99)
NonHDL: 118.47
Total CHOL/HDL Ratio: 3
Triglycerides: 94 mg/dL (ref 0.0–149.0)
VLDL: 18.8 mg/dL (ref 0.0–40.0)

## 2022-05-27 LAB — IBC + FERRITIN
Ferritin: 53.4 ng/mL (ref 10.0–291.0)
Iron: 151 ug/dL — ABNORMAL HIGH (ref 42–145)
Saturation Ratios: 41.2 % (ref 20.0–50.0)
TIBC: 366.8 ug/dL (ref 250.0–450.0)
Transferrin: 262 mg/dL (ref 212.0–360.0)

## 2022-05-27 MED ORDER — IRBESARTAN 300 MG PO TABS: 300.0000 mg | ORAL_TABLET | Freq: Every day | ORAL | 1 refills | Status: DC

## 2022-05-27 MED ORDER — ATORVASTATIN CALCIUM 40 MG PO TABS: 40.0000 mg | ORAL_TABLET | Freq: Every day | ORAL | 0 refills | Status: DC

## 2022-05-27 NOTE — Progress Notes (Signed)
Subjective:  Patient ID: Jessica Woodward, female    DOB: 1940-10-09  Age: 82 y.o. MRN: [id number]  CC: Hypertension and Hyperlipidemia   HPI Jessica Woodward presents for f/up -  Her blood pressures been well-controlled since she was last seen.  She is compliant with the ARB/thiazide diuretic combination.  She is active and denies chest pain, shortness of breath, diaphoresis, dizziness, or lightheadedness.  Outpatient Medications Prior to Visit  Medication Sig Dispense Refill   acetaminophen (TYLENOL) 325 MG tablet Take 1-2 tablets (325-650 mg total) by mouth every 4 (four) hours as needed for mild pain.     loratadine (CLARITIN) 10 MG tablet Take 10 mg by mouth daily.     meloxicam (MOBIC) 7.5 MG tablet Take 1 tablet (7.5 mg total) by mouth daily. 90 tablet 1   Multiple Vitamin (MULTIVITAMIN) tablet Take 1 tablet by mouth daily.     triamcinolone cream (KENALOG) 0.1 % Apply 1 application topically 4 (four) times daily as needed (rash).      zinc gluconate 50 MG tablet Take 1 tablet (50 mg total) by mouth daily. 90 tablet 1   atorvastatin (LIPITOR) 40 MG tablet TAKE 1 TABLET BY MOUTH DAILY 90 tablet 0   Ferric Maltol (ACCRUFER) 30 MG CAPS Take 1 capsule by mouth in the morning and at bedtime. 180 capsule 1   irbesartan (AVAPRO) 75 MG tablet TAKE 1 TABLET BY MOUTH DAILY 90 tablet 0   irbesartan-hydrochlorothiazide (AVALIDE) 300-12.5 MG tablet Take 1 tablet by mouth daily. 30 tablet 0   No facility-administered medications prior to visit.    ROS Review of Systems  Constitutional: Negative.  Negative for chills, diaphoresis, fatigue and fever.  Eyes: Negative.   Respiratory:  Negative for cough, chest tightness, shortness of breath and wheezing.   Cardiovascular:  Negative for chest pain, palpitations and leg swelling.  Gastrointestinal:  Negative for abdominal pain, blood in stool, constipation, diarrhea, nausea and vomiting.  Endocrine: Negative.   Genitourinary: Negative.  Negative  for difficulty urinating.  Musculoskeletal: Negative.   Skin: Negative.   Neurological:  Negative for dizziness, weakness and light-headedness.  Hematological:  Negative for adenopathy. Does not bruise/bleed easily.  Psychiatric/Behavioral: Negative.      Objective:  BP 128/78 (BP Location: Right Arm, Patient Position: Sitting, Cuff Size: Large)   Pulse 77   Temp 97.6 F (36.4 C) (Oral)   Resp 16   Ht '5\' 4"'$  (1.626 m)   Wt 147 lb (66.7 kg)   SpO2 98%   BMI 25.23 kg/m   BP Readings from Last 3 Encounters:  05/27/22 128/78  05/20/22 (!) 160/86  05/10/22 (!) 154/79    Wt Readings from Last 3 Encounters:  05/27/22 147 lb (66.7 kg)  05/20/22 147 lb (66.7 kg)  05/10/22 145 lb (65.8 kg)    Physical Exam Vitals reviewed.  HENT:     Nose: Nose normal.     Mouth/Throat:     Mouth: Mucous membranes are moist.  Eyes:     General: No scleral icterus.    Conjunctiva/sclera: Conjunctivae normal.  Cardiovascular:     Rate and Rhythm: Normal rate.     Heart sounds: No murmur heard. Pulmonary:     Effort: Pulmonary effort is normal.     Breath sounds: No stridor. No wheezing, rhonchi or rales.  Abdominal:     General: Abdomen is flat.     Palpations: There is no mass.     Tenderness: There is no abdominal tenderness.  There is no guarding.     Hernia: No hernia is present.  Musculoskeletal:        General: Normal range of motion.     Cervical back: Neck supple.     Right lower leg: No edema.     Left lower leg: No edema.  Lymphadenopathy:     Cervical: No cervical adenopathy.  Skin:    General: Skin is warm and dry.  Neurological:     General: No focal deficit present.     Mental Status: She is alert.  Psychiatric:        Mood and Affect: Mood normal.        Behavior: Behavior normal.     Lab Results  Component Value Date   WBC 7.1 05/27/2022   HGB 14.1 05/27/2022   HCT 41.9 05/27/2022   PLT 303.0 05/27/2022   GLUCOSE 97 05/27/2022   CHOL 188 05/27/2022    TRIG 94.0 05/27/2022   HDL 69.30 05/27/2022   LDLDIRECT 166.9 03/23/2013   LDLCALC 100 (H) 05/27/2022   ALT 14 05/27/2022   AST 20 05/27/2022   NA 140 05/27/2022   K 4.2 05/27/2022   CL 104 05/27/2022   CREATININE 0.81 05/27/2022   BUN 25 (H) 05/27/2022   CO2 28 05/27/2022   TSH 2.57 02/22/2022   HGBA1C 5.8 10/28/2021    DG Chest 2 View  Result Date: 05/10/2022 CLINICAL DATA:  Left arm pain EXAM: CHEST - 2 VIEW COMPARISON:  10/09/2021 FINDINGS: Left chest multi lead pacer. The heart size and mediastinal contours are within normal limits. Disc degenerative disease of the thoracic spine. IMPRESSION: No acute abnormality of the lungs. Electronically Signed   By: Delanna Ahmadi M.D.   On: 05/10/2022 20:19    Assessment & Plan:   Anesha was seen today for hypertension and hyperlipidemia.  Diagnoses and all orders for this visit:  Essential hypertension, benign- For her age her blood pressure is overcontrolled and her BUN is slightly elevated.  Will discontinue the thiazide diuretic and try to control her blood pressure with an ARB. -     Basic metabolic panel; Future -     Basic metabolic panel -     irbesartan (AVAPRO) 300 MG tablet; Take 1 tablet (300 mg total) by mouth daily.  Atherosclerosis of aorta ([place])- Risk factor modifications addressed. -     atorvastatin (LIPITOR) 40 MG tablet; Take 1 tablet (40 mg total) by mouth daily. -     Lipid panel; Future -     Lipid panel  Dyslipidemia, goal LDL below 130- LDL goal achieved. Doing well on the statin  -     atorvastatin (LIPITOR) 40 MG tablet; Take 1 tablet (40 mg total) by mouth daily. -     Hepatic function panel; Future -     Lipid panel; Future -     Lipid panel -     Hepatic function panel  DDD (degenerative disc disease), cervical  Iron deficiency anemia secondary to inadequate dietary iron intake- Her iron level is slightly elevated.  Will discontinue the iron supplement. -     IBC + Ferritin; Future -     CBC  with Differential/Platelet; Future -     CBC with Differential/Platelet -     IBC + Ferritin  Vitamin B12 deficiency anemia due to intrinsic factor deficiency -     CBC with Differential/Platelet; Future -     CBC with Differential/Platelet  Other orders -  Pneumococcal polysaccharide vaccine 23-valent greater than or equal to 2yo subcutaneous/IM   I have discontinued Katharine Look C. Giebler "Sandy"'s ACCRUFeR, irbesartan, and irbesartan-hydrochlorothiazide. I have also changed her atorvastatin. Additionally, I am having her start on irbesartan. Lastly, I am having her maintain her triamcinolone cream, multivitamin, loratadine, acetaminophen, meloxicam, and zinc gluconate.  Meds ordered this encounter  Medications   atorvastatin (LIPITOR) 40 MG tablet    Sig: Take 1 tablet (40 mg total) by mouth daily.    Dispense:  90 tablet    Refill:  0   irbesartan (AVAPRO) 300 MG tablet    Sig: Take 1 tablet (300 mg total) by mouth daily.    Dispense:  90 tablet    Refill:  1     Follow-up: Return in about 3 months (around 08/26/2022).  Scarlette Calico, MD

## 2022-05-27 NOTE — Patient Instructions (Signed)
Hypertension, Adult High blood pressure (hypertension) is when the force of blood pumping through the arteries is too strong. The arteries are the blood vessels that carry blood from the heart throughout the body. Hypertension forces the heart to work harder to pump blood and may cause arteries to become narrow or stiff. Untreated or uncontrolled hypertension can lead to a heart attack, heart failure, a stroke, kidney disease, and other problems. A blood pressure reading consists of a higher number over a lower number. Ideally, your blood pressure should be below 120/80. The first ("top") number is called the systolic pressure. It is a measure of the pressure in your arteries as your heart beats. The second ("bottom") number is called the diastolic pressure. It is a measure of the pressure in your arteries as the heart relaxes. What are the causes? The exact cause of this condition is not known. There are some conditions that result in high blood pressure. What increases the risk? Certain factors may make you more likely to develop high blood pressure. Some of these risk factors are under your control, including: Smoking. Not getting enough exercise or physical activity. Being overweight. Having too much fat, sugar, calories, or salt (sodium) in your diet. Drinking too much alcohol. Other risk factors include: Having a personal history of heart disease, diabetes, high cholesterol, or kidney disease. Stress. Having a family history of high blood pressure and high cholesterol. Having obstructive sleep apnea. Age. The risk increases with age. What are the signs or symptoms? High blood pressure may not cause symptoms. Very high blood pressure (hypertensive crisis) may cause: Headache. Fast or irregular heartbeats (palpitations). Shortness of breath. Nosebleed. Nausea and vomiting. Vision changes. Severe chest pain, dizziness, and seizures. How is this diagnosed? This condition is diagnosed by  measuring your blood pressure while you are seated, with your arm resting on a flat surface, your legs uncrossed, and your feet flat on the floor. The cuff of the blood pressure monitor will be placed directly against the skin of your upper arm at the level of your heart. Blood pressure should be measured at least twice using the same arm. Certain conditions can cause a difference in blood pressure between your right and left arms. If you have a high blood pressure reading during one visit or you have normal blood pressure with other risk factors, you may be asked to: Return on a different day to have your blood pressure checked again. Monitor your blood pressure at home for 1 week or longer. If you are diagnosed with hypertension, you may have other blood or imaging tests to help your health care provider understand your overall risk for other conditions. How is this treated? This condition is treated by making healthy lifestyle changes, such as eating healthy foods, exercising more, and reducing your alcohol intake. You may be referred for counseling on a healthy diet and physical activity. Your health care provider may prescribe medicine if lifestyle changes are not enough to get your blood pressure under control and if: Your systolic blood pressure is above 130. Your diastolic blood pressure is above 80. Your personal target blood pressure may vary depending on your medical conditions, your age, and other factors. Follow these instructions at home: Eating and drinking  Eat a diet that is high in fiber and potassium, and low in sodium, added sugar, and fat. An example of this eating plan is called the DASH diet. DASH stands for Dietary Approaches to Stop Hypertension. To eat this way: Eat   plenty of fresh fruits and vegetables. Try to fill one half of your plate at each meal with fruits and vegetables. Eat whole grains, such as whole-wheat pasta, brown rice, or whole-grain bread. Fill about one  fourth of your plate with whole grains. Eat or drink low-fat dairy products, such as skim milk or low-fat yogurt. Avoid fatty cuts of meat, processed or cured meats, and poultry with skin. Fill about one fourth of your plate with lean proteins, such as fish, chicken without skin, beans, eggs, or tofu. Avoid pre-made and processed foods. These tend to be higher in sodium, added sugar, and fat. Reduce your daily sodium intake. Many people with hypertension should eat less than 1,500 mg of sodium a day. Do not drink alcohol if: Your health care provider tells you not to drink. You are pregnant, may be pregnant, or are planning to become pregnant. If you drink alcohol: Limit how much you have to: 0-1 drink a day for women. 0-2 drinks a day for men. Know how much alcohol is in your drink. In the [place] one drink equals one 12 oz bottle of beer (355 mL), one 5 oz glass of wine (148 mL), or one 1 oz glass of hard liquor (44 mL). Lifestyle  Work with your health care provider to maintain a healthy body weight or to lose weight. Ask what an ideal weight is for you. Get at least 30 minutes of exercise that causes your heart to beat faster (aerobic exercise) most days of the week. Activities may include walking, swimming, or biking. Include exercise to strengthen your muscles (resistance exercise), such as Pilates or lifting weights, as part of your weekly exercise routine. Try to do these types of exercises for 30 minutes at least 3 days a week. Do not use any products that contain nicotine or tobacco. These products include cigarettes, chewing tobacco, and vaping devices, such as e-cigarettes. If you need help quitting, ask your health care provider. Monitor your blood pressure at home as told by your health care provider. Keep all follow-up visits. This is important. Medicines Take over-the-counter and prescription medicines only as told by your health care provider. Follow directions carefully. Blood  pressure medicines must be taken as prescribed. Do not skip doses of blood pressure medicine. Doing this puts you at risk for problems and can make the medicine less effective. Ask your health care provider about side effects or reactions to medicines that you should watch for. Contact a health care provider if you: Think you are having a reaction to a medicine you are taking. Have headaches that keep coming back (recurring). Feel dizzy. Have swelling in your ankles. Have trouble with your vision. Get help right away if you: Develop a severe headache or confusion. Have unusual weakness or numbness. Feel faint. Have severe pain in your chest or abdomen. Vomit repeatedly. Have trouble breathing. These symptoms may be an emergency. Get help right away. Call 911. Do not wait to see if the symptoms will go away. Do not drive yourself to the hospital. Summary Hypertension is when the force of blood pumping through your arteries is too strong. If this condition is not controlled, it may put you at risk for serious complications. Your personal target blood pressure may vary depending on your medical conditions, your age, and other factors. For most people, a normal blood pressure is less than 120/80. Hypertension is treated with lifestyle changes, medicines, or a combination of both. Lifestyle changes include losing weight, eating a healthy,   low-sodium diet, exercising more, and limiting alcohol. This information is not intended to replace advice given to you by your health care provider. Make sure you discuss any questions you have with your health care provider. Document Revised: 03/17/2021 Document Reviewed: 03/17/2021 Elsevier Patient Education  2023 [employer]  

## 2022-05-30 ENCOUNTER — Encounter: Payer: Self-pay | Admitting: Internal Medicine

## 2022-05-31 ENCOUNTER — Other Ambulatory Visit: Payer: Self-pay | Admitting: Internal Medicine

## 2022-05-31 DIAGNOSIS — M5030 Other cervical disc degeneration, unspecified cervical region: Secondary | ICD-10-CM

## 2022-05-31 NOTE — Telephone Encounter (Signed)
Patient called back today

## 2022-05-31 NOTE — Telephone Encounter (Signed)
Called [facility] they wanted to verify the mg that was sent in on Valsartan. Inform pharmacist Darlen med was increase to 300 mg on 05/27/21. She state she will cancel rx for the 75 mg and send out new rx. Notified pt via mychaft w/ status.Marland KitchenJohny Woodward

## 2022-06-10 ENCOUNTER — Ambulatory Visit: Payer: Medicare Other | Attending: Internal Medicine

## 2022-06-10 DIAGNOSIS — I442 Atrioventricular block, complete: Secondary | ICD-10-CM | POA: Diagnosis not present

## 2022-06-10 LAB — CUP PACEART REMOTE DEVICE CHECK
Battery Remaining Longevity: 113 mo
Battery Voltage: 3 V
Brady Statistic AP VP Percent: 4.05 %
Brady Statistic AP VS Percent: 0 %
Brady Statistic AS VP Percent: 95.94 %
Brady Statistic AS VS Percent: 0.01 %
Brady Statistic RA Percent Paced: 4.02 %
Brady Statistic RV Percent Paced: 99.99 %
Date Time Interrogation Session: 20240117222349
Implantable Lead Connection Status: 753985
Implantable Lead Connection Status: 753985
Implantable Lead Implant Date: 20210720
Implantable Lead Implant Date: 20210720
Implantable Lead Location: 753859
Implantable Lead Location: 753860
Implantable Lead Model: 3830
Implantable Lead Model: 5076
Implantable Pulse Generator Implant Date: 20210720
Lead Channel Impedance Value: 266 Ohm
Lead Channel Impedance Value: 380 Ohm
Lead Channel Impedance Value: 418 Ohm
Lead Channel Impedance Value: 570 Ohm
Lead Channel Pacing Threshold Amplitude: 0.625 V
Lead Channel Pacing Threshold Amplitude: 1 V
Lead Channel Pacing Threshold Pulse Width: 0.4 ms
Lead Channel Pacing Threshold Pulse Width: 0.4 ms
Lead Channel Sensing Intrinsic Amplitude: 1.25 mV
Lead Channel Sensing Intrinsic Amplitude: 1.25 mV
Lead Channel Sensing Intrinsic Amplitude: 8.875 mV
Lead Channel Sensing Intrinsic Amplitude: 8.875 mV
Lead Channel Setting Pacing Amplitude: 1.5 V
Lead Channel Setting Pacing Amplitude: 2.5 V
Lead Channel Setting Pacing Pulse Width: 0.4 ms
Lead Channel Setting Sensing Sensitivity: 2 mV
Zone Setting Status: 755011
Zone Setting Status: 755011

## 2022-07-01 NOTE — Progress Notes (Signed)
Remote pacemaker transmission.   

## 2022-07-02 ENCOUNTER — Other Ambulatory Visit: Payer: Self-pay | Admitting: Internal Medicine

## 2022-07-02 DIAGNOSIS — I700 Atherosclerosis of aorta: Secondary | ICD-10-CM

## 2022-07-02 DIAGNOSIS — E785 Hyperlipidemia, unspecified: Secondary | ICD-10-CM

## 2022-07-02 DIAGNOSIS — M5030 Other cervical disc degeneration, unspecified cervical region: Secondary | ICD-10-CM

## 2022-07-20 DIAGNOSIS — H903 Sensorineural hearing loss, bilateral: Secondary | ICD-10-CM | POA: Diagnosis not present

## 2022-07-20 DIAGNOSIS — H838X3 Other specified diseases of inner ear, bilateral: Secondary | ICD-10-CM | POA: Diagnosis not present

## 2022-07-20 DIAGNOSIS — H6121 Impacted cerumen, right ear: Secondary | ICD-10-CM | POA: Diagnosis not present

## 2022-08-02 ENCOUNTER — Other Ambulatory Visit: Payer: Self-pay | Admitting: Internal Medicine

## 2022-08-02 DIAGNOSIS — Z1231 Encounter for screening mammogram for malignant neoplasm of breast: Secondary | ICD-10-CM

## 2022-08-16 ENCOUNTER — Encounter: Payer: Self-pay | Admitting: Internal Medicine

## 2022-08-16 ENCOUNTER — Ambulatory Visit (INDEPENDENT_AMBULATORY_CARE_PROVIDER_SITE_OTHER): Payer: Medicare Other | Admitting: Internal Medicine

## 2022-08-16 ENCOUNTER — Telehealth: Payer: Self-pay | Admitting: Internal Medicine

## 2022-08-16 VITALS — BP 136/84 | HR 87 | Temp 98.4°F | Ht 64.0 in | Wt 146.0 lb

## 2022-08-16 DIAGNOSIS — D508 Other iron deficiency anemias: Secondary | ICD-10-CM

## 2022-08-16 DIAGNOSIS — I10 Essential (primary) hypertension: Secondary | ICD-10-CM | POA: Diagnosis not present

## 2022-08-16 DIAGNOSIS — F5104 Psychophysiologic insomnia: Secondary | ICD-10-CM

## 2022-08-16 DIAGNOSIS — D538 Other specified nutritional anemias: Secondary | ICD-10-CM

## 2022-08-16 DIAGNOSIS — E038 Other specified hypothyroidism: Secondary | ICD-10-CM

## 2022-08-16 DIAGNOSIS — D510 Vitamin B12 deficiency anemia due to intrinsic factor deficiency: Secondary | ICD-10-CM

## 2022-08-16 LAB — CBC WITH DIFFERENTIAL/PLATELET
Basophils Absolute: 0 10*3/uL (ref 0.0–0.1)
Basophils Relative: 0.6 % (ref 0.0–3.0)
Eosinophils Absolute: 0.1 10*3/uL (ref 0.0–0.7)
Eosinophils Relative: 1.4 % (ref 0.0–5.0)
HCT: 41.2 % (ref 36.0–46.0)
Hemoglobin: 14.1 g/dL (ref 12.0–15.0)
Lymphocytes Relative: 26.1 % (ref 12.0–46.0)
Lymphs Abs: 1.5 10*3/uL (ref 0.7–4.0)
MCHC: 34.1 g/dL (ref 30.0–36.0)
MCV: 92 fl (ref 78.0–100.0)
Monocytes Absolute: 0.5 10*3/uL (ref 0.1–1.0)
Monocytes Relative: 7.8 % (ref 3.0–12.0)
Neutro Abs: 3.8 10*3/uL (ref 1.4–7.7)
Neutrophils Relative %: 64.1 % (ref 43.0–77.0)
Platelets: 276 10*3/uL (ref 150.0–400.0)
RBC: 4.48 Mil/uL (ref 3.87–5.11)
RDW: 14.7 % (ref 11.5–15.5)
WBC: 5.9 10*3/uL (ref 4.0–10.5)

## 2022-08-16 LAB — BASIC METABOLIC PANEL
BUN: 21 mg/dL (ref 6–23)
CO2: 25 mEq/L (ref 19–32)
Calcium: 10.5 mg/dL (ref 8.4–10.5)
Chloride: 105 mEq/L (ref 96–112)
Creatinine, Ser: 0.9 mg/dL (ref 0.40–1.20)
GFR: 60.11 mL/min (ref >60.00)
Glucose, Bld: 96 mg/dL (ref 70–99)
Potassium: 4.3 mEq/L (ref 3.5–5.1)
Sodium: 139 mEq/L (ref 135–145)

## 2022-08-16 MED ORDER — TRAZODONE HCL 50 MG PO TABS: 50.0000 mg | ORAL_TABLET | Freq: Every evening | ORAL | 1 refills | Status: DC | PRN

## 2022-08-16 NOTE — Patient Instructions (Signed)
Hypertension, Adult High blood pressure (hypertension) is when the force of blood pumping through the arteries is too strong. The arteries are the blood vessels that carry blood from the heart throughout the body. Hypertension forces the heart to work harder to pump blood and may cause arteries to become narrow or stiff. Untreated or uncontrolled hypertension can lead to a heart attack, heart failure, a stroke, kidney disease, and other problems. A blood pressure reading consists of a higher number over a lower number. Ideally, your blood pressure should be below 120/80. The first ("top") number is called the systolic pressure. It is a measure of the pressure in your arteries as your heart beats. The second ("bottom") number is called the diastolic pressure. It is a measure of the pressure in your arteries as the heart relaxes. What are the causes? The exact cause of this condition is not known. There are some conditions that result in high blood pressure. What increases the risk? Certain factors may make you more likely to develop high blood pressure. Some of these risk factors are under your control, including: Smoking. Not getting enough exercise or physical activity. Being overweight. Having too much fat, sugar, calories, or salt (sodium) in your diet. Drinking too much alcohol. Other risk factors include: Having a personal history of heart disease, diabetes, high cholesterol, or kidney disease. Stress. Having a family history of high blood pressure and high cholesterol. Having obstructive sleep apnea. Age. The risk increases with age. What are the signs or symptoms? High blood pressure may not cause symptoms. Very high blood pressure (hypertensive crisis) may cause: Headache. Fast or irregular heartbeats (palpitations). Shortness of breath. Nosebleed. Nausea and vomiting. Vision changes. Severe chest pain, dizziness, and seizures. How is this diagnosed? This condition is diagnosed by  measuring your blood pressure while you are seated, with your arm resting on a flat surface, your legs uncrossed, and your feet flat on the floor. The cuff of the blood pressure monitor will be placed directly against the skin of your upper arm at the level of your heart. Blood pressure should be measured at least twice using the same arm. Certain conditions can cause a difference in blood pressure between your right and left arms. If you have a high blood pressure reading during one visit or you have normal blood pressure with other risk factors, you may be asked to: Return on a different day to have your blood pressure checked again. Monitor your blood pressure at home for 1 week or longer. If you are diagnosed with hypertension, you may have other blood or imaging tests to help your health care provider understand your overall risk for other conditions. How is this treated? This condition is treated by making healthy lifestyle changes, such as eating healthy foods, exercising more, and reducing your alcohol intake. You may be referred for counseling on a healthy diet and physical activity. Your health care provider may prescribe medicine if lifestyle changes are not enough to get your blood pressure under control and if: Your systolic blood pressure is above 130. Your diastolic blood pressure is above 80. Your personal target blood pressure may vary depending on your medical conditions, your age, and other factors. Follow these instructions at home: Eating and drinking  Eat a diet that is high in fiber and potassium, and low in sodium, added sugar, and fat. An example of this eating plan is called the DASH diet. DASH stands for Dietary Approaches to Stop Hypertension. To eat this way: Eat   plenty of fresh fruits and vegetables. Try to fill one half of your plate at each meal with fruits and vegetables. Eat whole grains, such as whole-wheat pasta, brown rice, or whole-grain bread. Fill about one  fourth of your plate with whole grains. Eat or drink low-fat dairy products, such as skim milk or low-fat yogurt. Avoid fatty cuts of meat, processed or cured meats, and poultry with skin. Fill about one fourth of your plate with lean proteins, such as fish, chicken without skin, beans, eggs, or tofu. Avoid pre-made and processed foods. These tend to be higher in sodium, added sugar, and fat. Reduce your daily sodium intake. Many people with hypertension should eat less than 1,500 mg of sodium a day. Do not drink alcohol if: Your health care provider tells you not to drink. You are pregnant, may be pregnant, or are planning to become pregnant. If you drink alcohol: Limit how much you have to: 0-1 drink a day for women. 0-2 drinks a day for men. Know how much alcohol is in your drink. In the [place] one drink equals one 12 oz bottle of beer (355 mL), one 5 oz glass of wine (148 mL), or one 1 oz glass of hard liquor (44 mL). Lifestyle  Work with your health care provider to maintain a healthy body weight or to lose weight. Ask what an ideal weight is for you. Get at least 30 minutes of exercise that causes your heart to beat faster (aerobic exercise) most days of the week. Activities may include walking, swimming, or biking. Include exercise to strengthen your muscles (resistance exercise), such as Pilates or lifting weights, as part of your weekly exercise routine. Try to do these types of exercises for 30 minutes at least 3 days a week. Do not use any products that contain nicotine or tobacco. These products include cigarettes, chewing tobacco, and vaping devices, such as e-cigarettes. If you need help quitting, ask your health care provider. Monitor your blood pressure at home as told by your health care provider. Keep all follow-up visits. This is important. Medicines Take over-the-counter and prescription medicines only as told by your health care provider. Follow directions carefully. Blood  pressure medicines must be taken as prescribed. Do not skip doses of blood pressure medicine. Doing this puts you at risk for problems and can make the medicine less effective. Ask your health care provider about side effects or reactions to medicines that you should watch for. Contact a health care provider if you: Think you are having a reaction to a medicine you are taking. Have headaches that keep coming back (recurring). Feel dizzy. Have swelling in your ankles. Have trouble with your vision. Get help right away if you: Develop a severe headache or confusion. Have unusual weakness or numbness. Feel faint. Have severe pain in your chest or abdomen. Vomit repeatedly. Have trouble breathing. These symptoms may be an emergency. Get help right away. Call 911. Do not wait to see if the symptoms will go away. Do not drive yourself to the hospital. Summary Hypertension is when the force of blood pumping through your arteries is too strong. If this condition is not controlled, it may put you at risk for serious complications. Your personal target blood pressure may vary depending on your medical conditions, your age, and other factors. For most people, a normal blood pressure is less than 120/80. Hypertension is treated with lifestyle changes, medicines, or a combination of both. Lifestyle changes include losing weight, eating a healthy,   low-sodium diet, exercising more, and limiting alcohol. This information is not intended to replace advice given to you by your health care provider. Make sure you discuss any questions you have with your health care provider. Document Revised: 03/17/2021 Document Reviewed: 03/17/2021 Elsevier Patient Education  2023 [employer]  

## 2022-08-16 NOTE — Progress Notes (Unsigned)
Subjective:  Patient ID: Jessica Woodward, female    DOB: 16-Feb-1941  Age: 82 y.o. MRN: [id number]  CC: Hypothyroidism, Hypertension, and Anemia   HPI Jessica Woodward presents for f/up ----  Her BP has been well controlled. She is active and denies DOE, CP, SOB, edema.  Outpatient Medications Prior to Visit  Medication Sig Dispense Refill   acetaminophen (TYLENOL) 325 MG tablet Take 1-2 tablets (325-650 mg total) by mouth every 4 (four) hours as needed for mild pain.     atorvastatin (LIPITOR) 40 MG tablet TAKE 1 TABLET BY MOUTH DAILY 90 tablet 1   irbesartan (AVAPRO) 300 MG tablet Take 1 tablet (300 mg total) by mouth daily. 90 tablet 1   loratadine (CLARITIN) 10 MG tablet Take 10 mg by mouth daily.     meloxicam (MOBIC) 7.5 MG tablet TAKE 1 TABLET BY MOUTH DAILY 90 tablet 1   Multiple Vitamin (MULTIVITAMIN) tablet Take 1 tablet by mouth daily.     triamcinolone cream (KENALOG) 0.1 % Apply 1 application topically 4 (four) times daily as needed (rash).      zinc gluconate 50 MG tablet Take 1 tablet (50 mg total) by mouth daily. 90 tablet 1   No facility-administered medications prior to visit.    ROS Review of Systems  Constitutional: Negative.  Negative for diaphoresis and fatigue.  HENT: Negative.    Eyes: Negative.   Respiratory:  Negative for cough, chest tightness, shortness of breath and wheezing.   Cardiovascular:  Negative for chest pain, palpitations and leg swelling.  Gastrointestinal:  Negative for abdominal pain, diarrhea, nausea and vomiting.  Endocrine: Negative.   Genitourinary: Negative.  Negative for difficulty urinating.  Musculoskeletal: Negative.  Negative for arthralgias and myalgias.  Skin: Negative.   Neurological: Negative.  Negative for dizziness, weakness, light-headedness and numbness.  Hematological:  Negative for adenopathy. Does not bruise/bleed easily.  Psychiatric/Behavioral:  Positive for sleep disturbance. Negative for decreased  concentration, dysphoric mood and suicidal ideas. The patient is not nervous/anxious.     Objective:  BP 136/84 (BP Location: Right Arm, Patient Position: Sitting, Cuff Size: Large)   Pulse 87   Temp 98.4 F (36.9 C) (Oral)   Ht 5\' 4"  (1.626 m)   Wt 146 lb (66.2 kg)   SpO2 97%   BMI 25.06 kg/m   BP Readings from Last 3 Encounters:  08/16/22 136/84  05/27/22 128/78  05/20/22 (!) 160/86    Wt Readings from Last 3 Encounters:  08/16/22 146 lb (66.2 kg)  05/27/22 147 lb (66.7 kg)  05/20/22 147 lb (66.7 kg)    Physical Exam Vitals reviewed.  Constitutional:      Appearance: Normal appearance.  HENT:     Nose: Nose normal.     Mouth/Throat:     Mouth: Mucous membranes are moist.  Eyes:     General: No scleral icterus.    Conjunctiva/sclera: Conjunctivae normal.  Cardiovascular:     Rate and Rhythm: Normal rate and regular rhythm.     Heart sounds: No murmur heard. Pulmonary:     Effort: Pulmonary effort is normal.     Breath sounds: No stridor. No wheezing, rhonchi or rales.  Abdominal:     General: Abdomen is flat.     Palpations: There is no mass.     Tenderness: There is no abdominal tenderness. There is no guarding.     Hernia: No hernia is present.  Musculoskeletal:  General: Normal range of motion.     Cervical back: Neck supple.     Right lower leg: No edema.     Left lower leg: No edema.  Lymphadenopathy:     Cervical: No cervical adenopathy.  Skin:    General: Skin is warm and dry.  Neurological:     General: No focal deficit present.     Mental Status: She is alert.  Psychiatric:        Mood and Affect: Mood normal.        Behavior: Behavior normal.     Lab Results  Component Value Date   WBC 5.9 08/16/2022   HGB 14.1 08/16/2022   HCT 41.2 08/16/2022   PLT 276.0 08/16/2022   GLUCOSE 96 08/16/2022   CHOL 188 05/27/2022   TRIG 94.0 05/27/2022   HDL 69.30 05/27/2022   LDLDIRECT 166.9 03/23/2013   LDLCALC 100 (H) 05/27/2022   ALT 14  05/27/2022   AST 20 05/27/2022   NA 139 08/16/2022   K 4.3 08/16/2022   CL 105 08/16/2022   CREATININE 0.90 08/16/2022   BUN 21 08/16/2022   CO2 25 08/16/2022   TSH 2.72 08/16/2022   HGBA1C 5.8 10/28/2021    DG Chest 2 View  Result Date: 05/10/2022 CLINICAL DATA:  Left arm pain EXAM: CHEST - 2 VIEW COMPARISON:  10/09/2021 FINDINGS: Left chest multi lead pacer. The heart size and mediastinal contours are within normal limits. Disc degenerative disease of the thoracic spine. IMPRESSION: No acute abnormality of the lungs. Electronically Signed   By: Delanna Ahmadi M.D.   On: 05/10/2022 20:19    Assessment & Plan:  Anemia due to zinc deficiency -     CBC with Differential/Platelet; Future  Essential hypertension, benign- Her BP is well controlled. -     TSH; Future -     Basic metabolic panel; Future  Iron deficiency anemia secondary to inadequate dietary iron intake -     CBC with Differential/Platelet; Future  Subclinical hypothyroidism- She is euthyroid. -     TSH; Future  Vitamin B12 deficiency anemia due to intrinsic factor deficiency -     CBC with Differential/Platelet; Future  Psychophysiological insomnia -     traZODone HCl; Take 1 tablet (50 mg total) by mouth at bedtime as needed for sleep.  Dispense: 90 tablet; Refill: 1     Follow-up: Return in about 6 months (around 02/16/2023).  Scarlette Calico, MD

## 2022-08-16 NOTE — Telephone Encounter (Signed)
Pt states not sure of the name , but it was something that supposed to help her sleep. Pls advise.Marland KitchenJohny Chess

## 2022-08-16 NOTE — Telephone Encounter (Signed)
Patient was seen today and she said that her medication has not been sent in yet - She was not sure what the medication was:  Please advise.\ [place]

## 2022-08-17 LAB — TSH: TSH: 2.72 u[IU]/mL (ref 0.35–5.50)

## 2022-09-09 ENCOUNTER — Ambulatory Visit: Payer: Medicare Other

## 2022-09-20 ENCOUNTER — Encounter: Payer: Self-pay | Admitting: Internal Medicine

## 2022-09-22 ENCOUNTER — Ambulatory Visit (INDEPENDENT_AMBULATORY_CARE_PROVIDER_SITE_OTHER): Payer: Medicare Other

## 2022-09-22 DIAGNOSIS — I442 Atrioventricular block, complete: Secondary | ICD-10-CM

## 2022-09-22 LAB — CUP PACEART REMOTE DEVICE CHECK
Battery Remaining Longevity: 112 mo
Battery Voltage: 3 V
Brady Statistic AP VP Percent: 14.27 %
Brady Statistic AP VS Percent: 0 %
Brady Statistic AS VP Percent: 85.72 %
Brady Statistic AS VS Percent: 0.01 %
Brady Statistic RA Percent Paced: 14.22 %
Brady Statistic RV Percent Paced: 99.99 %
Date Time Interrogation Session: 20240501033830
Implantable Lead Connection Status: 753985
Implantable Lead Connection Status: 753985
Implantable Lead Implant Date: 20210720
Implantable Lead Implant Date: 20210720
Implantable Lead Location: 753859
Implantable Lead Location: 753860
Implantable Lead Model: 3830
Implantable Lead Model: 5076
Implantable Pulse Generator Implant Date: 20210720
Lead Channel Impedance Value: 304 Ohm
Lead Channel Impedance Value: 380 Ohm
Lead Channel Impedance Value: 437 Ohm
Lead Channel Impedance Value: 608 Ohm
Lead Channel Pacing Threshold Amplitude: 0.625 V
Lead Channel Pacing Threshold Amplitude: 1.125 V
Lead Channel Pacing Threshold Pulse Width: 0.4 ms
Lead Channel Pacing Threshold Pulse Width: 0.4 ms
Lead Channel Sensing Intrinsic Amplitude: 2.875 mV
Lead Channel Sensing Intrinsic Amplitude: 2.875 mV
Lead Channel Sensing Intrinsic Amplitude: 8.875 mV
Lead Channel Sensing Intrinsic Amplitude: 8.875 mV
Lead Channel Setting Pacing Amplitude: 1.5 V
Lead Channel Setting Pacing Amplitude: 2.5 V
Lead Channel Setting Pacing Pulse Width: 0.4 ms
Lead Channel Setting Sensing Sensitivity: 2 mV
Zone Setting Status: 755011
Zone Setting Status: 755011

## 2022-09-28 ENCOUNTER — Ambulatory Visit
Admission: RE | Admit: 2022-09-28 | Discharge: 2022-09-28 | Disposition: A | Discharge status: Home / Self Care | Payer: Medicare Other | Source: Ambulatory Visit | Attending: Internal Medicine | Admitting: Internal Medicine

## 2022-09-28 DIAGNOSIS — Z1231 Encounter for screening mammogram for malignant neoplasm of breast: Secondary | ICD-10-CM | POA: Diagnosis not present

## 2022-09-28 HISTORY — DX: Malignant neoplasm of unspecified site of unspecified female breast: C50.919

## 2022-10-07 ENCOUNTER — Other Ambulatory Visit: Payer: Self-pay | Admitting: Internal Medicine

## 2022-10-07 DIAGNOSIS — I10 Essential (primary) hypertension: Secondary | ICD-10-CM

## 2022-10-13 NOTE — Progress Notes (Signed)
Remote pacemaker transmission.   

## 2022-11-08 IMAGING — MG MM DIGITAL SCREENING BILAT W/ TOMO AND CAD
6 of 10 series · 6 of 30 positions shown · non-contrast
Comparison: Previous exam(s).

CLINICAL DATA: Screening.

EXAM:
DIGITAL SCREENING BILATERAL MAMMOGRAM WITH TOMOSYNTHESIS AND CAD
TECHNIQUE: Bilateral screening digital craniocaudal and mediolateral oblique
mammograms were obtained. Bilateral screening digital breast
tomosynthesis was performed. The images were evaluated with
computer-aided detection.

[L MLO synth-2D (1 of 2)]
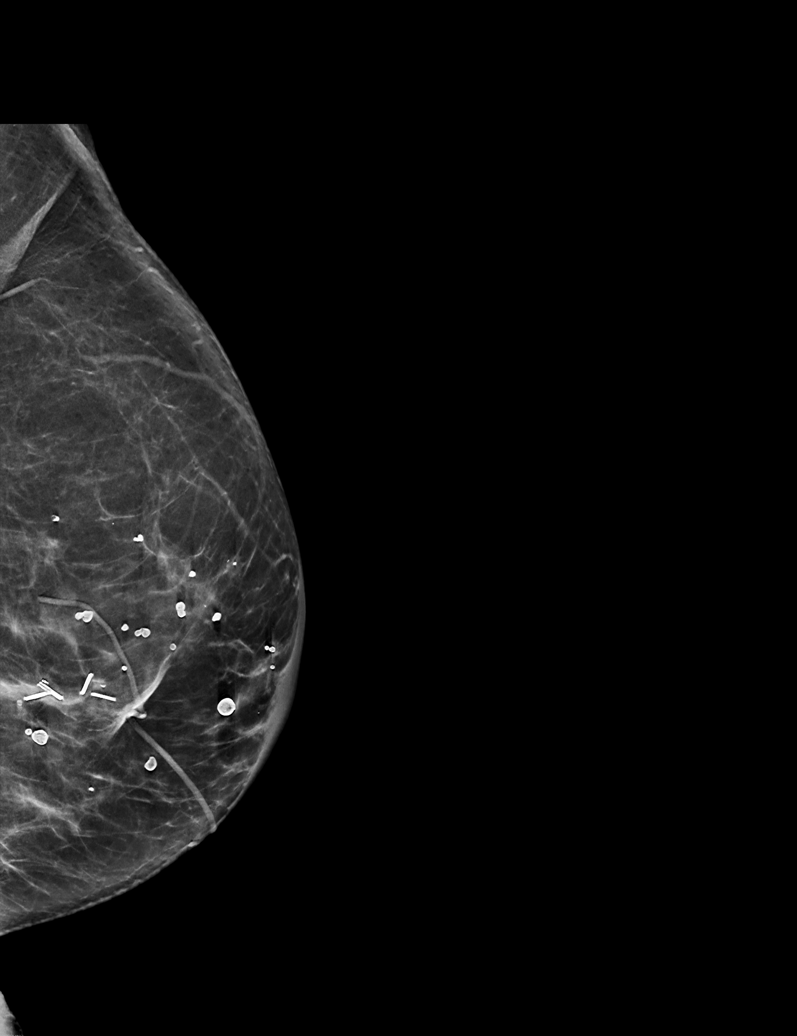

[R MLO synth-2D]
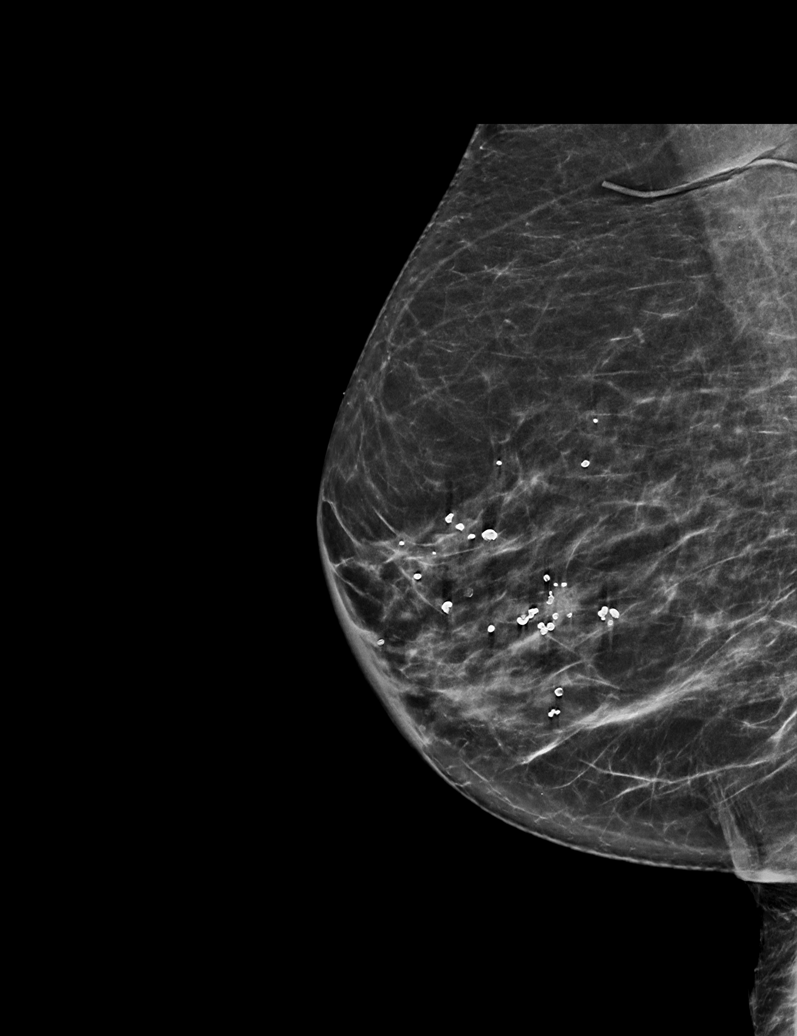

[L CC synth-2D]
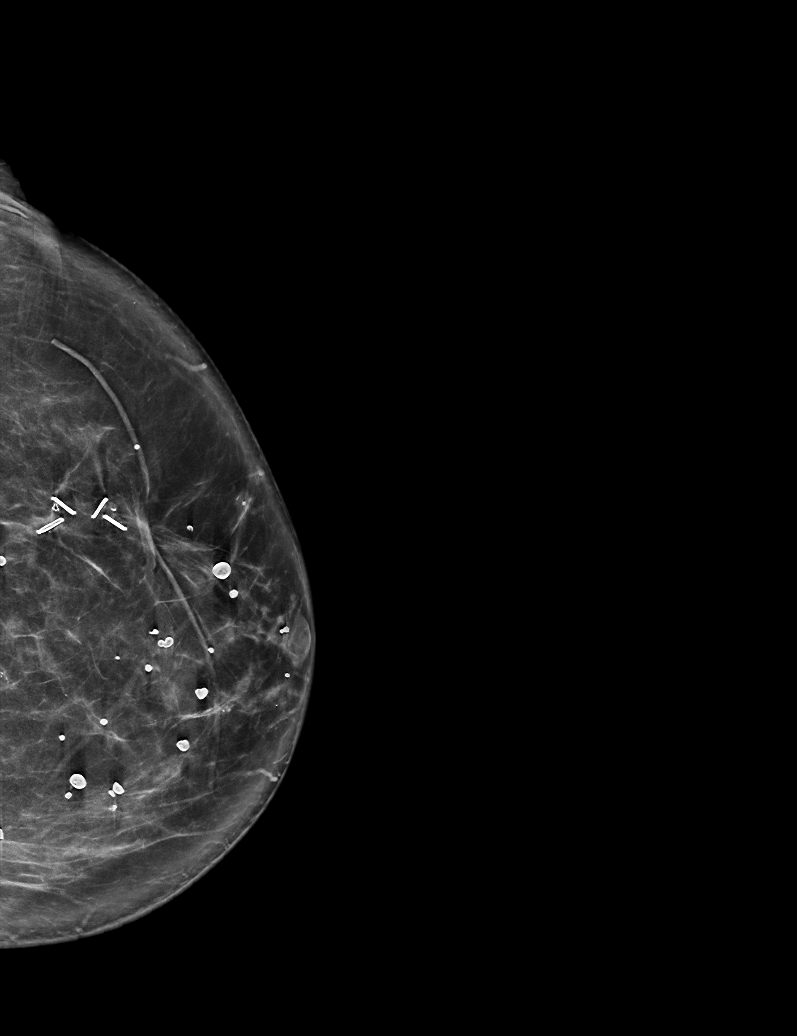

[L MLO synth-2D (2 of 2)]
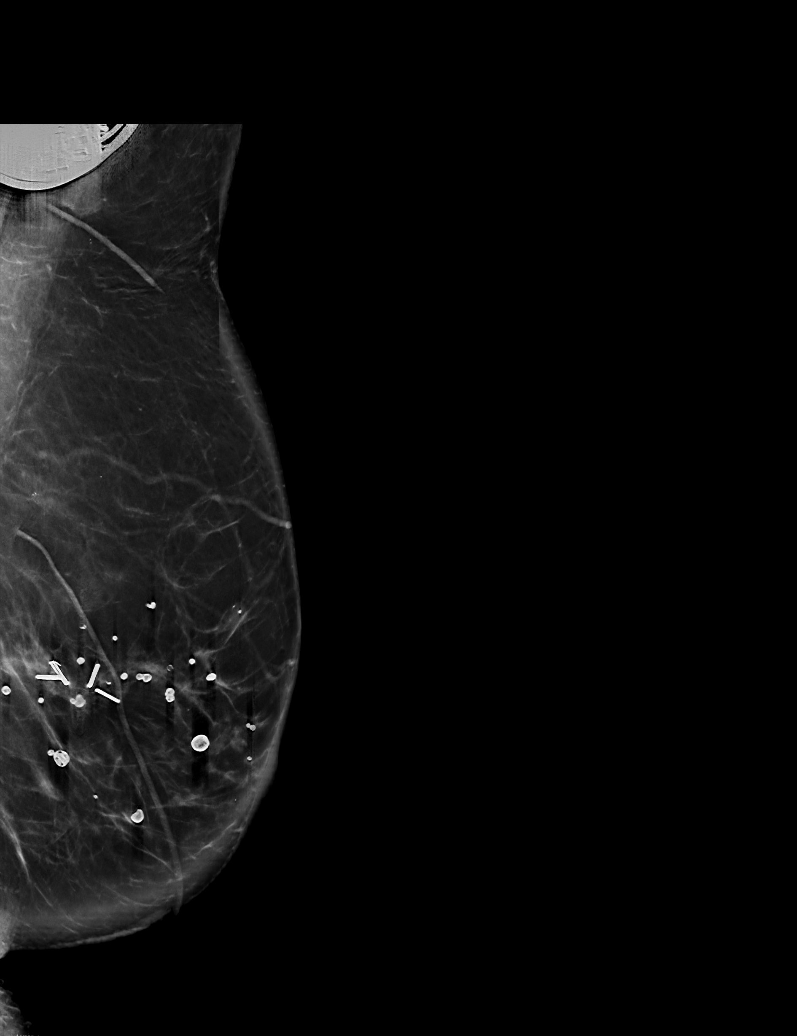

[R CC synth-2D]
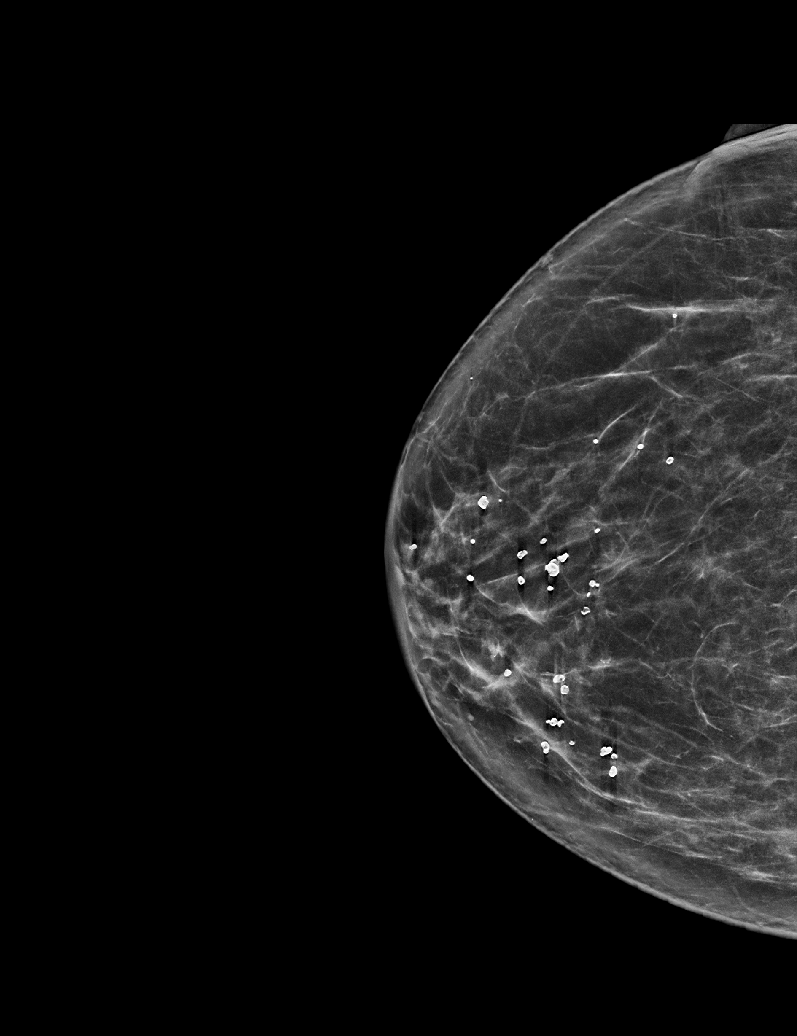

[L CC tomo · tomo slice 35/70.0]
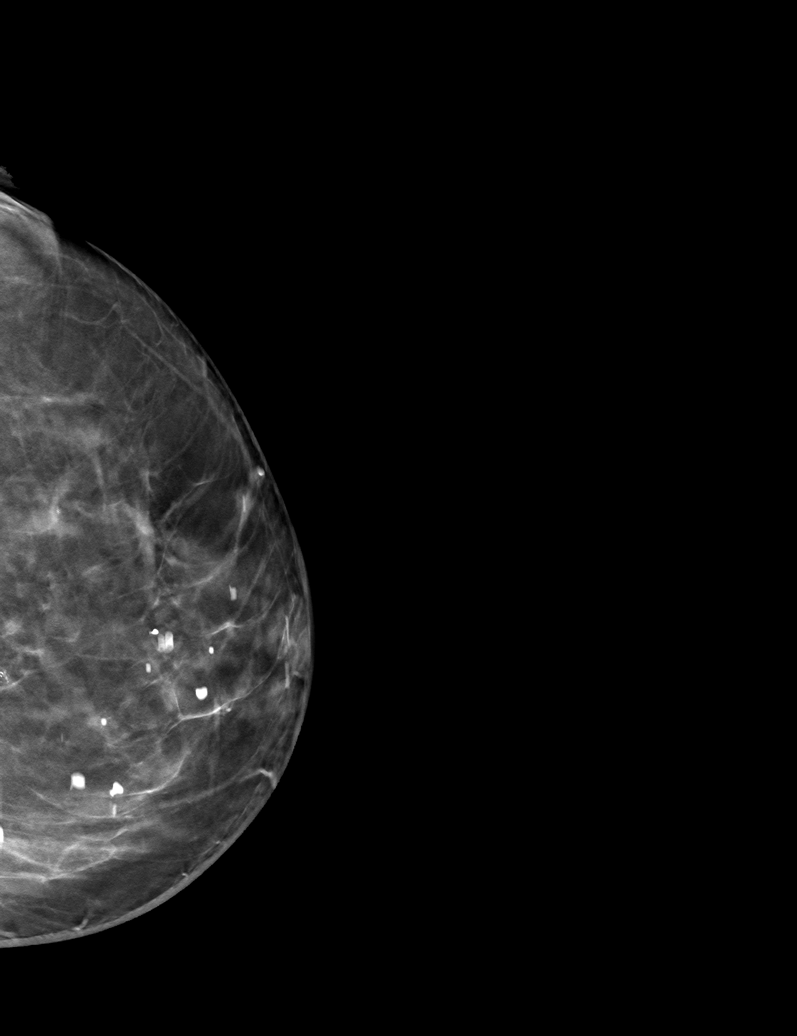

[6 of 30 positions shown; findings below may reference images not displayed]

ACR Breast Density Category b: There are scattered areas of
fibroglandular density.
FINDINGS: There are no findings suspicious for malignancy. The images were
evaluated with computer-aided detection.
IMPRESSION: No mammographic evidence of malignancy. A result letter of this
screening mammogram will be mailed directly to the patient.

RECOMMENDATION:
Screening mammogram in one year. (Code:WJ-I-BG6)

BI-RADS CATEGORY  1: Negative.

## 2022-12-09 ENCOUNTER — Ambulatory Visit: Payer: Medicare Other

## 2022-12-22 ENCOUNTER — Telehealth: Payer: Self-pay

## 2022-12-22 ENCOUNTER — Ambulatory Visit (INDEPENDENT_AMBULATORY_CARE_PROVIDER_SITE_OTHER): Payer: Medicare Other

## 2022-12-22 DIAGNOSIS — I442 Atrioventricular block, complete: Secondary | ICD-10-CM | POA: Diagnosis not present

## 2022-12-22 NOTE — Telephone Encounter (Signed)
The patient called stating on my chart there was some red marks by the battery and wanted to know if anything was wrong with her battery. I told her her battery was fine. She has 9 years on the battery. Can someone please review her transmission and make sure I told her correctly please. Thanks.

## 2023-01-05 NOTE — Progress Notes (Signed)
Remote pacemaker transmission.   

## 2023-01-06 ENCOUNTER — Encounter (INDEPENDENT_AMBULATORY_CARE_PROVIDER_SITE_OTHER): Payer: Self-pay

## 2023-01-09 ENCOUNTER — Other Ambulatory Visit: Payer: Self-pay | Admitting: Internal Medicine

## 2023-01-09 DIAGNOSIS — I10 Essential (primary) hypertension: Secondary | ICD-10-CM

## 2023-02-10 ENCOUNTER — Encounter: Payer: Self-pay | Admitting: Internal Medicine

## 2023-02-10 ENCOUNTER — Ambulatory Visit (INDEPENDENT_AMBULATORY_CARE_PROVIDER_SITE_OTHER): Payer: Medicare Other | Admitting: Internal Medicine

## 2023-02-10 VITALS — BP 132/78 | HR 75 | Temp 98.2°F | Resp 16 | Ht 64.0 in | Wt 149.0 lb

## 2023-02-10 DIAGNOSIS — N1832 Chronic kidney disease, stage 3b: Secondary | ICD-10-CM | POA: Diagnosis not present

## 2023-02-10 DIAGNOSIS — I700 Atherosclerosis of aorta: Secondary | ICD-10-CM

## 2023-02-10 DIAGNOSIS — D510 Vitamin B12 deficiency anemia due to intrinsic factor deficiency: Secondary | ICD-10-CM

## 2023-02-10 DIAGNOSIS — E785 Hyperlipidemia, unspecified: Secondary | ICD-10-CM | POA: Diagnosis not present

## 2023-02-10 DIAGNOSIS — I10 Essential (primary) hypertension: Secondary | ICD-10-CM | POA: Diagnosis not present

## 2023-02-10 DIAGNOSIS — E2839 Other primary ovarian failure: Secondary | ICD-10-CM | POA: Diagnosis not present

## 2023-02-10 DIAGNOSIS — E038 Other specified hypothyroidism: Secondary | ICD-10-CM | POA: Diagnosis not present

## 2023-02-10 DIAGNOSIS — D538 Other specified nutritional anemias: Secondary | ICD-10-CM | POA: Diagnosis not present

## 2023-02-10 DIAGNOSIS — Z23 Encounter for immunization: Secondary | ICD-10-CM | POA: Diagnosis not present

## 2023-02-10 LAB — BASIC METABOLIC PANEL
BUN: 22 mg/dL (ref 6–23)
CO2: 27 mEq/L (ref 19–32)
Calcium: 10.4 mg/dL (ref 8.4–10.5)
Chloride: 106 mEq/L (ref 96–112)
Creatinine, Ser: 1.01 mg/dL (ref 0.40–1.20)
GFR: 52.16 mL/min — ABNORMAL LOW (ref >60.00)
Glucose, Bld: 92 mg/dL (ref 70–99)
Potassium: 4.2 mEq/L (ref 3.5–5.1)
Sodium: 141 mEq/L (ref 135–145)

## 2023-02-10 LAB — CBC WITH DIFFERENTIAL/PLATELET
Basophils Absolute: 0 10*3/uL (ref 0.0–0.1)
Basophils Relative: 0.8 % (ref 0.0–3.0)
Eosinophils Absolute: 0.1 10*3/uL (ref 0.0–0.7)
Eosinophils Relative: 1.9 % (ref 0.0–5.0)
HCT: 41.2 % (ref 36.0–46.0)
Hemoglobin: 13.6 g/dL (ref 12.0–15.0)
Lymphocytes Relative: 29.1 % (ref 12.0–46.0)
Lymphs Abs: 1.7 10*3/uL (ref 0.7–4.0)
MCHC: 33.1 g/dL (ref 30.0–36.0)
MCV: 93.2 fl (ref 78.0–100.0)
Monocytes Absolute: 0.5 10*3/uL (ref 0.1–1.0)
Monocytes Relative: 8.8 % (ref 3.0–12.0)
Neutro Abs: 3.6 10*3/uL (ref 1.4–7.7)
Neutrophils Relative %: 59.4 % (ref 43.0–77.0)
Platelets: 289 10*3/uL (ref 150.0–400.0)
RBC: 4.42 Mil/uL (ref 3.87–5.11)
RDW: 14.2 % (ref 11.5–15.5)
WBC: 6 10*3/uL (ref 4.0–10.5)

## 2023-02-10 LAB — TSH: TSH: 4.46 u[IU]/mL (ref 0.35–5.50)

## 2023-02-10 MED ORDER — IRBESARTAN 300 MG PO TABS
300.0000 mg | ORAL_TABLET | Freq: Every day | ORAL | 1 refills | Status: DC
Start: 2023-02-10 — End: 2023-10-04

## 2023-02-10 MED ORDER — IRBESARTAN 300 MG PO TABS
300.0000 mg | ORAL_TABLET | Freq: Every day | ORAL | 0 refills | Status: DC
Start: 2023-02-10 — End: 2023-02-10

## 2023-02-10 MED ORDER — ATORVASTATIN CALCIUM 40 MG PO TABS
40.0000 mg | ORAL_TABLET | Freq: Every day | ORAL | 1 refills | Status: DC
Start: 2023-02-10 — End: 2023-08-15

## 2023-02-10 NOTE — Progress Notes (Unsigned)
Subjective:  Patient ID: Jessica Woodward, female    DOB: 26-Dec-1940  Age: 82 y.o. MRN: [id number]  CC: Hypertension   HPI Jessica Woodward presents for f/up -    She is active and denies DOE, CP, SOB, edema.  Outpatient Medications Prior to Visit  Medication Sig Dispense Refill   acetaminophen (TYLENOL) 325 MG tablet Take 1-2 tablets (325-650 mg total) by mouth every 4 (four) hours as needed for mild pain.     Cholecalciferol (VITAMIN D3) 250 MCG (10000 UT) TABS Take 1,000 Units by mouth daily.     loratadine (CLARITIN) 10 MG tablet Take 10 mg by mouth daily.     Multiple Vitamin (MULTIVITAMIN) tablet Take 1 tablet by mouth daily.     triamcinolone cream (KENALOG) 0.1 % Apply 1 application topically 4 (four) times daily as needed (rash).      zinc gluconate 50 MG tablet Take 1 tablet (50 mg total) by mouth daily. 90 tablet 1   atorvastatin (LIPITOR) 40 MG tablet TAKE 1 TABLET BY MOUTH DAILY 90 tablet 1   irbesartan (AVAPRO) 300 MG tablet TAKE 1 TABLET BY MOUTH DAILY 90 tablet 0   meloxicam (MOBIC) 7.5 MG tablet TAKE 1 TABLET BY MOUTH DAILY 90 tablet 1   traZODone (DESYREL) 50 MG tablet Take 1 tablet (50 mg total) by mouth at bedtime as needed for sleep. 90 tablet 1   No facility-administered medications prior to visit.    ROS Review of Systems  Constitutional: Negative.  Negative for diaphoresis and fatigue.  HENT: Negative.    Eyes: Negative.   Respiratory:  Negative for chest tightness and shortness of breath.   Cardiovascular:  Negative for chest pain, palpitations and leg swelling.  Gastrointestinal:  Negative for abdominal pain, diarrhea, nausea and vomiting.  Endocrine: Negative.   Genitourinary: Negative.  Negative for difficulty urinating and dysuria.  Musculoskeletal:  Positive for arthralgias. Negative for joint swelling and myalgias.  Skin: Negative.   Neurological: Negative.  Negative for dizziness and weakness.  Hematological:  Negative for adenopathy. Does not  bruise/bleed easily.  Psychiatric/Behavioral: Negative.      Objective:  BP 132/78 (BP Location: Left Arm, Patient Position: Sitting, Cuff Size: Normal)   Pulse 75   Temp 98.2 F (36.8 C) (Oral)   Resp 16   Ht 5\' 4"  (1.626 m)   Wt 149 lb (67.6 kg)   SpO2 99%   BMI 25.58 kg/m   BP Readings from Last 3 Encounters:  02/10/23 132/78  08/16/22 136/84  05/27/22 128/78    Wt Readings from Last 3 Encounters:  02/10/23 149 lb (67.6 kg)  08/16/22 146 lb (66.2 kg)  05/27/22 147 lb (66.7 kg)    Physical Exam Vitals reviewed.  Constitutional:      Appearance: Normal appearance.  HENT:     Nose: Nose normal.     Mouth/Throat:     Mouth: Mucous membranes are moist.  Eyes:     General: No scleral icterus.    Conjunctiva/sclera: Conjunctivae normal.  Cardiovascular:     Rate and Rhythm: Normal rate and regular rhythm.     Pulses: Normal pulses.     Heart sounds: No murmur heard.    No friction rub. No gallop.     Comments: EKG- Atrial-sensed ventricular-paced rhythm, 61 bpm No acute changes  Pulmonary:     Effort: Pulmonary effort is normal.     Breath sounds: No stridor. No wheezing, rhonchi or rales.  Abdominal:  General: Abdomen is flat.     Palpations: There is no mass.     Tenderness: There is no abdominal tenderness. There is no guarding.     Hernia: No hernia is present.  Musculoskeletal:     Cervical back: Neck supple.     Right lower leg: No edema.     Left lower leg: No edema.  Skin:    General: Skin is warm and dry.  Neurological:     General: No focal deficit present.     Mental Status: She is alert. Mental status is at baseline.  Psychiatric:        Mood and Affect: Mood normal.        Behavior: Behavior normal.     Lab Results  Component Value Date   WBC 6.0 02/10/2023   HGB 13.6 02/10/2023   HCT 41.2 02/10/2023   PLT 289.0 02/10/2023   GLUCOSE 92 02/10/2023   CHOL 188 05/27/2022   TRIG 94.0 05/27/2022   HDL 69.30 05/27/2022   LDLDIRECT  166.9 03/23/2013   LDLCALC 100 (H) 05/27/2022   ALT 14 05/27/2022   AST 20 05/27/2022   NA 141 02/10/2023   K 4.2 02/10/2023   CL 106 02/10/2023   CREATININE 1.01 02/10/2023   BUN 22 02/10/2023   CO2 27 02/10/2023   TSH 4.46 02/10/2023   HGBA1C 5.8 10/28/2021    MM 3D SCREENING MAMMOGRAM BILATERAL BREAST  Result Date: 09/29/2022 CLINICAL DATA:  Screening. EXAM: DIGITAL SCREENING BILATERAL MAMMOGRAM WITH TOMOSYNTHESIS AND CAD TECHNIQUE: Bilateral screening digital craniocaudal and mediolateral oblique mammograms were obtained. Bilateral screening digital breast tomosynthesis was performed. The images were evaluated with computer-aided detection. COMPARISON:  Previous exam(s). ACR Breast Density Category b: There are scattered areas of fibroglandular density. FINDINGS: There are no findings suspicious for malignancy. IMPRESSION: No mammographic evidence of malignancy. A result letter of this screening mammogram will be mailed directly to the patient. RECOMMENDATION: Screening mammogram in one year. (Code:[id number]-B-01Y) BI-RADS CATEGORY  1: Negative. Electronically Signed   By: Frederico Hamman M.D.   On: 09/29/2022 12:53    Assessment & Plan:  Flu vaccine need -     Flu Vaccine Trivalent High Dose (Fluad)  Essential hypertension, benign- Her BP is well controlled. -     Basic metabolic panel; Future -     TSH; Future -     EKG 12-Lead -     Irbesartan; Take 1 tablet (300 mg total) by mouth daily.  Dispense: 90 tablet; Refill: 1  Subclinical hypothyroidism - She is euthyroid. -     TSH; Future  Vitamin B12 deficiency anemia due to intrinsic factor deficiency -     CBC with Differential/Platelet; Future  Anemia due to zinc deficiency -     CBC with Differential/Platelet; Future  Estrogen deficiency -     DG Bone Density; Future  Stage 3b chronic kidney disease (HCC) - Will discontinue the nsaid. She wants to see nephrology.  Atherosclerosis of aorta (HCC) -     Atorvastatin  Calcium; Take 1 tablet (40 mg total) by mouth daily.  Dispense: 90 tablet; Refill: 1  Dyslipidemia, goal LDL below 130 -     Atorvastatin Calcium; Take 1 tablet (40 mg total) by mouth daily.  Dispense: 90 tablet; Refill: 1     Follow-up: Return in about 6 months (around 08/10/2023).  Sanda Linger, MD

## 2023-02-10 NOTE — Patient Instructions (Signed)
Hypertension, Adult High blood pressure (hypertension) is when the force of blood pumping through the arteries is too strong. The arteries are the blood vessels that carry blood from the heart throughout the body. Hypertension forces the heart to work harder to pump blood and may cause arteries to become narrow or stiff. Untreated or uncontrolled hypertension can lead to a heart attack, heart failure, a stroke, kidney disease, and other problems. A blood pressure reading consists of a higher number over a lower number. Ideally, your blood pressure should be below 120/80. The first ("top") number is called the systolic pressure. It is a measure of the pressure in your arteries as your heart beats. The second ("bottom") number is called the diastolic pressure. It is a measure of the pressure in your arteries as the heart relaxes. What are the causes? The exact cause of this condition is not known. There are some conditions that result in high blood pressure. What increases the risk? Certain factors may make you more likely to develop high blood pressure. Some of these risk factors are under your control, including: Smoking. Not getting enough exercise or physical activity. Being overweight. Having too much fat, sugar, calories, or salt (sodium) in your diet. Drinking too much alcohol. Other risk factors include: Having a personal history of heart disease, diabetes, high cholesterol, or kidney disease. Stress. Having a family history of high blood pressure and high cholesterol. Having obstructive sleep apnea. Age. The risk increases with age. What are the signs or symptoms? High blood pressure may not cause symptoms. Very high blood pressure (hypertensive crisis) may cause: Headache. Fast or irregular heartbeats (palpitations). Shortness of breath. Nosebleed. Nausea and vomiting. Vision changes. Severe chest pain, dizziness, and seizures. How is this diagnosed? This condition is diagnosed by  measuring your blood pressure while you are seated, with your arm resting on a flat surface, your legs uncrossed, and your feet flat on the floor. The cuff of the blood pressure monitor will be placed directly against the skin of your upper arm at the level of your heart. Blood pressure should be measured at least twice using the same arm. Certain conditions can cause a difference in blood pressure between your right and left arms. If you have a high blood pressure reading during one visit or you have normal blood pressure with other risk factors, you may be asked to: Return on a different day to have your blood pressure checked again. Monitor your blood pressure at home for 1 week or longer. If you are diagnosed with hypertension, you may have other blood or imaging tests to help your health care provider understand your overall risk for other conditions. How is this treated? This condition is treated by making healthy lifestyle changes, such as eating healthy foods, exercising more, and reducing your alcohol intake. You may be referred for counseling on a healthy diet and physical activity. Your health care provider may prescribe medicine if lifestyle changes are not enough to get your blood pressure under control and if: Your systolic blood pressure is above 130. Your diastolic blood pressure is above 80. Your personal target blood pressure may vary depending on your medical conditions, your age, and other factors. Follow these instructions at home: Eating and drinking  Eat a diet that is high in fiber and potassium, and low in sodium, added sugar, and fat. An example of this eating plan is called the DASH diet. DASH stands for Dietary Approaches to Stop Hypertension. To eat this way: Eat   plenty of fresh fruits and vegetables. Try to fill one half of your plate at each meal with fruits and vegetables. Eat whole grains, such as whole-wheat pasta, brown rice, or whole-grain bread. Fill about one  fourth of your plate with whole grains. Eat or drink low-fat dairy products, such as skim milk or low-fat yogurt. Avoid fatty cuts of meat, processed or cured meats, and poultry with skin. Fill about one fourth of your plate with lean proteins, such as fish, chicken without skin, beans, eggs, or tofu. Avoid pre-made and processed foods. These tend to be higher in sodium, added sugar, and fat. Reduce your daily sodium intake. Many people with hypertension should eat less than 1,500 mg of sodium a day. Do not drink alcohol if: Your health care provider tells you not to drink. You are pregnant, may be pregnant, or are planning to become pregnant. If you drink alcohol: Limit how much you have to: 0-1 drink a day for women. 0-2 drinks a day for men. Know how much alcohol is in your drink. In the [place] one drink equals one 12 oz bottle of beer (355 mL), one 5 oz glass of wine (148 mL), or one 1 oz glass of hard liquor (44 mL). Lifestyle  Work with your health care provider to maintain a healthy body weight or to lose weight. Ask what an ideal weight is for you. Get at least 30 minutes of exercise that causes your heart to beat faster (aerobic exercise) most days of the week. Activities may include walking, swimming, or biking. Include exercise to strengthen your muscles (resistance exercise), such as Pilates or lifting weights, as part of your weekly exercise routine. Try to do these types of exercises for 30 minutes at least 3 days a week. Do not use any products that contain nicotine or tobacco. These products include cigarettes, chewing tobacco, and vaping devices, such as e-cigarettes. If you need help quitting, ask your health care provider. Monitor your blood pressure at home as told by your health care provider. Keep all follow-up visits. This is important. Medicines Take over-the-counter and prescription medicines only as told by your health care provider. Follow directions carefully. Blood  pressure medicines must be taken as prescribed. Do not skip doses of blood pressure medicine. Doing this puts you at risk for problems and can make the medicine less effective. Ask your health care provider about side effects or reactions to medicines that you should watch for. Contact a health care provider if you: Think you are having a reaction to a medicine you are taking. Have headaches that keep coming back (recurring). Feel dizzy. Have swelling in your ankles. Have trouble with your vision. Get help right away if you: Develop a severe headache or confusion. Have unusual weakness or numbness. Feel faint. Have severe pain in your chest or abdomen. Vomit repeatedly. Have trouble breathing. These symptoms may be an emergency. Get help right away. Call 911. Do not wait to see if the symptoms will go away. Do not drive yourself to the hospital. Summary Hypertension is when the force of blood pumping through your arteries is too strong. If this condition is not controlled, it may put you at risk for serious complications. Your personal target blood pressure may vary depending on your medical conditions, your age, and other factors. For most people, a normal blood pressure is less than 120/80. Hypertension is treated with lifestyle changes, medicines, or a combination of both. Lifestyle changes include losing weight, eating a healthy,   low-sodium diet, exercising more, and limiting alcohol. This information is not intended to replace advice given to you by your health care provider. Make sure you discuss any questions you have with your health care provider. Document Revised: 03/17/2021 Document Reviewed: 03/17/2021 Elsevier Patient Education  2024 [employer] Inc.  

## 2023-02-14 ENCOUNTER — Other Ambulatory Visit: Payer: Self-pay | Admitting: Internal Medicine

## 2023-02-14 DIAGNOSIS — M1711 Unilateral primary osteoarthritis, right knee: Secondary | ICD-10-CM | POA: Diagnosis not present

## 2023-02-14 DIAGNOSIS — M25562 Pain in left knee: Secondary | ICD-10-CM | POA: Diagnosis not present

## 2023-02-14 DIAGNOSIS — N1832 Chronic kidney disease, stage 3b: Secondary | ICD-10-CM

## 2023-03-01 ENCOUNTER — Other Ambulatory Visit: Payer: Self-pay | Admitting: Internal Medicine

## 2023-03-01 DIAGNOSIS — M5030 Other cervical disc degeneration, unspecified cervical region: Secondary | ICD-10-CM

## 2023-03-03 ENCOUNTER — Encounter: Payer: Self-pay | Admitting: Internal Medicine

## 2023-03-07 ENCOUNTER — Ambulatory Visit (INDEPENDENT_AMBULATORY_CARE_PROVIDER_SITE_OTHER)
Admission: RE | Admit: 2023-03-07 | Discharge: 2023-03-07 | Disposition: A | Discharge status: Home / Self Care | Payer: Medicare Other | Source: Ambulatory Visit | Attending: Internal Medicine | Admitting: Internal Medicine

## 2023-03-07 DIAGNOSIS — E2839 Other primary ovarian failure: Secondary | ICD-10-CM

## 2023-03-08 ENCOUNTER — Encounter: Payer: Self-pay | Admitting: Internal Medicine

## 2023-03-09 ENCOUNTER — Other Ambulatory Visit: Payer: Self-pay | Admitting: Internal Medicine

## 2023-03-10 ENCOUNTER — Ambulatory Visit: Payer: Medicare Other

## 2023-03-16 ENCOUNTER — Telehealth: Payer: Self-pay

## 2023-03-16 NOTE — Telephone Encounter (Signed)
Evenity VOB initiated via [email or web address]

## 2023-03-17 NOTE — Telephone Encounter (Signed)
Pt ready for scheduling for EVENITY on or after : 03/17/23  Out-of-pocket cost due at time of visit: $0   Primary: MEDICARE Prolia co-insurance: 0% Admin fee co-insurance: 0%  Secondary: AARP-MEDSUP Prolia co-insurance:  Admin fee co-insurance: If a facility fee is billed, a $20 copay will apply  Medical Benefit Details: Date Benefits were checked: 03/16/23 Deductible: $240 MET OF $240 REQUIRED/ Coinsurance: 0%/ Admin Fee: 0%  Prior Auth: N/A PA# Expiration Date:  # of doses approved:   Pharmacy benefit: Copay $--- If patient wants fill through the pharmacy benefit please send prescription to:  --- , and include estimated need by date in rx notes. Pharmacy will ship medication directly to the office.  Patient NOT eligible for Evenity Copay Card. Copay Card can make patient's cost as little as $25. Link to apply: [email or web address]://www.[email or web address]  ** This summary of benefits is an estimation of the patient's out-of-pocket cost. Exact cost may very based on individual plan coverage.

## 2023-03-17 NOTE — Telephone Encounter (Signed)
FYI

## 2023-03-21 ENCOUNTER — Ambulatory Visit: Payer: Medicare Other

## 2023-03-21 DIAGNOSIS — H43811 Vitreous degeneration, right eye: Secondary | ICD-10-CM | POA: Diagnosis not present

## 2023-03-21 DIAGNOSIS — H2513 Age-related nuclear cataract, bilateral: Secondary | ICD-10-CM | POA: Diagnosis not present

## 2023-03-21 DIAGNOSIS — H04123 Dry eye syndrome of bilateral lacrimal glands: Secondary | ICD-10-CM | POA: Diagnosis not present

## 2023-03-21 NOTE — Telephone Encounter (Signed)
Patient is cleared for EVENITY injection on 03/21/2023 per PA information on 03/17/2027.  Mentioned in provider result note last on 02/2023. Medication is CLINIC supplied. CoPay:$0

## 2023-03-23 ENCOUNTER — Ambulatory Visit (INDEPENDENT_AMBULATORY_CARE_PROVIDER_SITE_OTHER): Payer: Medicare Other

## 2023-03-23 DIAGNOSIS — I442 Atrioventricular block, complete: Secondary | ICD-10-CM | POA: Diagnosis not present

## 2023-03-23 LAB — CUP PACEART REMOTE DEVICE CHECK
Battery Remaining Longevity: 106 mo
Battery Voltage: 3 V
Brady Statistic AP VP Percent: 13.16 %
Brady Statistic AP VS Percent: 0 %
Brady Statistic AS VP Percent: 86.83 %
Brady Statistic AS VS Percent: 0.01 %
Brady Statistic RA Percent Paced: 13.12 %
Brady Statistic RV Percent Paced: 99.99 %
Date Time Interrogation Session: 20241029220400
Implantable Lead Connection Status: 753985
Implantable Lead Connection Status: 753985
Implantable Lead Implant Date: 20210720
Implantable Lead Implant Date: 20210720
Implantable Lead Location: 753859
Implantable Lead Location: 753860
Implantable Lead Model: 3830
Implantable Lead Model: 5076
Implantable Pulse Generator Implant Date: 20210720
Lead Channel Impedance Value: 304 Ohm
Lead Channel Impedance Value: 361 Ohm
Lead Channel Impedance Value: 380 Ohm
Lead Channel Impedance Value: 608 Ohm
Lead Channel Pacing Threshold Amplitude: 0.625 V
Lead Channel Pacing Threshold Amplitude: 1.125 V
Lead Channel Pacing Threshold Pulse Width: 0.4 ms
Lead Channel Pacing Threshold Pulse Width: 0.4 ms
Lead Channel Sensing Intrinsic Amplitude: 3 mV
Lead Channel Sensing Intrinsic Amplitude: 3 mV
Lead Channel Sensing Intrinsic Amplitude: 8.875 mV
Lead Channel Sensing Intrinsic Amplitude: 8.875 mV
Lead Channel Setting Pacing Amplitude: 1.5 V
Lead Channel Setting Pacing Amplitude: 2.5 V
Lead Channel Setting Pacing Pulse Width: 0.4 ms
Lead Channel Setting Sensing Sensitivity: 2 mV
Zone Setting Status: 755011
Zone Setting Status: 755011

## 2023-03-24 ENCOUNTER — Encounter: Payer: Self-pay | Admitting: Internal Medicine

## 2023-03-25 ENCOUNTER — Encounter: Payer: Self-pay | Admitting: Internal Medicine

## 2023-03-30 ENCOUNTER — Ambulatory Visit: Payer: Medicare Other | Admitting: Radiology

## 2023-03-30 DIAGNOSIS — M810 Age-related osteoporosis without current pathological fracture: Secondary | ICD-10-CM | POA: Diagnosis not present

## 2023-03-30 MED ORDER — ROMOSOZUMAB-AQQG 105 MG/1.17ML ~~LOC~~ SOSY
210.0000 mg | PREFILLED_SYRINGE | Freq: Once | SUBCUTANEOUS | Status: DC
Start: 2023-04-29 — End: 2023-07-05

## 2023-03-30 MED ORDER — ROMOSOZUMAB-AQQG 105 MG/1.17ML ~~LOC~~ SOSY
210.0000 mg | PREFILLED_SYRINGE | Freq: Once | SUBCUTANEOUS | Status: AC
Start: 2023-03-30 — End: 2023-03-30
  Administered 2023-03-30: 210 mg via SUBCUTANEOUS

## 2023-03-30 NOTE — Progress Notes (Signed)
Patient here for first Evenity shot. Patient did complain of a hot sensation when the medication was administered, other wise no other complications.

## 2023-04-11 ENCOUNTER — Encounter: Payer: Self-pay | Admitting: Internal Medicine

## 2023-04-11 NOTE — Progress Notes (Unsigned)
    Subjective:    Patient ID: Jessica Woodward, female    DOB: July 09, 1940, 82 y.o.   MRN: [id number]      HPI Jessica Woodward is here for No chief complaint on file.    Back pain -     Medications and allergies reviewed with patient and updated if appropriate.  Current Outpatient Medications on File Prior to Visit  Medication Sig Dispense Refill   acetaminophen (TYLENOL) 325 MG tablet Take 1-2 tablets (325-650 mg total) by mouth every 4 (four) hours as needed for mild pain.     atorvastatin (LIPITOR) 40 MG tablet Take 1 tablet (40 mg total) by mouth daily. 90 tablet 1   Cholecalciferol (VITAMIN D3) 250 MCG (10000 UT) TABS Take 1,000 Units by mouth daily.     irbesartan (AVAPRO) 300 MG tablet Take 1 tablet (300 mg total) by mouth daily. 90 tablet 1   loratadine (CLARITIN) 10 MG tablet Take 10 mg by mouth daily.     Multiple Vitamin (MULTIVITAMIN) tablet Take 1 tablet by mouth daily.     triamcinolone cream (KENALOG) 0.1 % Apply 1 application topically 4 (four) times daily as needed (rash).      zinc gluconate 50 MG tablet Take 1 tablet (50 mg total) by mouth daily. 90 tablet 1   Current Facility-Administered Medications on File Prior to Visit  Medication Dose Route Frequency Provider Last Rate Last Admin   [START ON 04/29/2023] Romosozumab-aqqg (EVENITY) 105 MG/1. injection 210 mg  210 mg Subcutaneous Once Etta Grandchild, MD        Review of Systems     Objective:  There were no vitals filed for this visit. BP Readings from Last 3 Encounters:  02/10/23 132/78  08/16/22 136/84  05/27/22 128/78   Wt Readings from Last 3 Encounters:  02/10/23 149 lb (67.6 kg)  08/16/22 146 lb (66.2 kg)  05/27/22 147 lb (66.7 kg)   There is no height or weight on file to calculate BMI.    Physical Exam         Assessment & Plan:    See Problem List for Assessment and Plan of chronic medical problems.

## 2023-04-12 ENCOUNTER — Ambulatory Visit (INDEPENDENT_AMBULATORY_CARE_PROVIDER_SITE_OTHER): Payer: Medicare Other

## 2023-04-12 ENCOUNTER — Ambulatory Visit (INDEPENDENT_AMBULATORY_CARE_PROVIDER_SITE_OTHER): Payer: Medicare Other | Admitting: Internal Medicine

## 2023-04-12 VITALS — BP 138/90 | HR 91 | Temp 98.0°F | Ht 64.0 in | Wt 147.0 lb

## 2023-04-12 DIAGNOSIS — M545 Low back pain, unspecified: Secondary | ICD-10-CM

## 2023-04-12 DIAGNOSIS — M4187 Other forms of scoliosis, lumbosacral region: Secondary | ICD-10-CM | POA: Diagnosis not present

## 2023-04-12 DIAGNOSIS — M47816 Spondylosis without myelopathy or radiculopathy, lumbar region: Secondary | ICD-10-CM | POA: Diagnosis not present

## 2023-04-12 MED ORDER — TRAMADOL HCL 50 MG PO TABS: 50.0000 mg | ORAL_TABLET | Freq: Three times a day (TID) | ORAL | 0 refills | Status: AC | PRN

## 2023-04-12 MED ORDER — PREDNISONE 20 MG PO TABS: 20.0000 mg | ORAL_TABLET | Freq: Every day | ORAL | 0 refills | Status: AC

## 2023-04-12 NOTE — Progress Notes (Signed)
Remote pacemaker transmission.   

## 2023-04-12 NOTE — Patient Instructions (Addendum)
     Have an xray downstairs    Medications changes include :   tramadol 50 mg three times a day as needed for pain - take tylenol with it.   Take prednisone 20 mg daily with food for 5 days if tolerated - you can stop it early if  you have side effects or cut the pill in 1/2 and take 10 mg      Return if symptoms worsen or fail to improve.

## 2023-04-14 ENCOUNTER — Encounter: Payer: Self-pay | Admitting: Internal Medicine

## 2023-04-14 DIAGNOSIS — M545 Low back pain, unspecified: Secondary | ICD-10-CM

## 2023-04-25 DIAGNOSIS — Z85828 Personal history of other malignant neoplasm of skin: Secondary | ICD-10-CM | POA: Diagnosis not present

## 2023-04-25 DIAGNOSIS — D692 Other nonthrombocytopenic purpura: Secondary | ICD-10-CM | POA: Diagnosis not present

## 2023-04-25 DIAGNOSIS — L820 Inflamed seborrheic keratosis: Secondary | ICD-10-CM | POA: Diagnosis not present

## 2023-04-29 ENCOUNTER — Ambulatory Visit: Payer: Medicare Other

## 2023-04-29 DIAGNOSIS — M810 Age-related osteoporosis without current pathological fracture: Secondary | ICD-10-CM | POA: Diagnosis not present

## 2023-04-29 MED ORDER — ROMOSOZUMAB-AQQG 105 MG/1.17ML ~~LOC~~ SOSY
210.0000 mg | PREFILLED_SYRINGE | Freq: Once | SUBCUTANEOUS | Status: AC
Start: 2023-05-31 — End: ?

## 2023-04-29 MED ORDER — ROMOSOZUMAB-AQQG 105 MG/1.17ML ~~LOC~~ SOSY
210.0000 mg | PREFILLED_SYRINGE | Freq: Once | SUBCUTANEOUS | Status: AC
Start: 2023-04-29 — End: 2023-04-29
  Administered 2023-04-29: 210 mg via SUBCUTANEOUS

## 2023-04-29 NOTE — Progress Notes (Signed)
Pt was given Evenity injection w/o any complications at this time.

## 2023-05-09 ENCOUNTER — Telehealth: Payer: Self-pay | Admitting: Internal Medicine

## 2023-05-09 NOTE — Telephone Encounter (Signed)
Patient wants to know can you send her urology referral elsewhere because she's been waiting 2.5 months and this is very important to her and she can't wait any longer.

## 2023-05-09 NOTE — Telephone Encounter (Signed)
Pt need to know what's going on with her referral she states that its been 2 months and she still haven't heard anything..  Pt will like for her nurse to call her back to explain the process and what is the hold up after 2 months..  Please advise, Thanks  Pt stated you can call her or mychart her which ever one is convenient.  CB#: [phone]

## 2023-05-19 ENCOUNTER — Telehealth: Payer: Self-pay | Admitting: Internal Medicine

## 2023-05-19 NOTE — Telephone Encounter (Signed)
Patient has been made aware that she will not be able to get her next shot until after 1/6 and do to [place] going into a new physical year we may have to re run her insurance for another PA. She gave a verbal understanding and will wait to hear from [place].

## 2023-05-19 NOTE — Telephone Encounter (Signed)
Copied from CRM [id number]. Topic: Appointments - Appointment Scheduling >> May 17, 2023 11:29 AM Pascal Lux wrote: Patient/patient representative is calling to schedule an appointment. Refer to attachments for appointment information. Patient called to schedule an appointment. Dr. Yetta Barre stated patient would need evenity shot and patient wants to know when she can be scheduled for her next shot. She would like a call back.  Is pt cleared by insurance for more evinity shot?  Last shot was on 12.6.24

## 2023-05-30 DIAGNOSIS — D631 Anemia in chronic kidney disease: Secondary | ICD-10-CM | POA: Diagnosis not present

## 2023-05-30 DIAGNOSIS — N1831 Chronic kidney disease, stage 3a: Secondary | ICD-10-CM | POA: Diagnosis not present

## 2023-05-30 DIAGNOSIS — E538 Deficiency of other specified B group vitamins: Secondary | ICD-10-CM | POA: Diagnosis not present

## 2023-05-30 DIAGNOSIS — I129 Hypertensive chronic kidney disease with stage 1 through stage 4 chronic kidney disease, or unspecified chronic kidney disease: Secondary | ICD-10-CM | POA: Diagnosis not present

## 2023-05-30 DIAGNOSIS — N2581 Secondary hyperparathyroidism of renal origin: Secondary | ICD-10-CM | POA: Diagnosis not present

## 2023-05-31 ENCOUNTER — Other Ambulatory Visit (HOSPITAL_COMMUNITY): Payer: Self-pay | Admitting: Nephrology

## 2023-05-31 ENCOUNTER — Encounter: Payer: Self-pay | Admitting: Internal Medicine

## 2023-05-31 DIAGNOSIS — N1831 Chronic kidney disease, stage 3a: Secondary | ICD-10-CM

## 2023-05-31 DIAGNOSIS — M545 Low back pain, unspecified: Secondary | ICD-10-CM | POA: Insufficient documentation

## 2023-05-31 LAB — BASIC METABOLIC PANEL
BUN: 17 (ref 4–21)
CO2: 21 (ref 13–22)
Chloride: 106 (ref 99–108)
Creatinine: 0.8 (ref 0.5–1.1)
Glucose: 93
Potassium: 4.5 meq/L (ref 3.5–5.1)
Sodium: 141 (ref 137–147)

## 2023-05-31 LAB — COMPREHENSIVE METABOLIC PANEL
Albumin: 4.7 (ref 3.5–5.0)
Calcium: 9.8 (ref 8.7–10.7)
eGFR: 73

## 2023-05-31 LAB — CBC AND DIFFERENTIAL
HCT: 43 (ref 36–46)
Hemoglobin: 14.5 (ref 12.0–16.0)
Platelets: 306 10*3/uL (ref 150–400)
WBC: 6.6

## 2023-06-01 ENCOUNTER — Telehealth: Payer: Self-pay

## 2023-06-01 DIAGNOSIS — M545 Low back pain, unspecified: Secondary | ICD-10-CM | POA: Diagnosis not present

## 2023-06-01 NOTE — Telephone Encounter (Signed)
 Evenity VOB initiated via [email or web address]  Last Evenity inj: 04/29/23 Next Evenity inj DUE: 05/30/23

## 2023-06-02 ENCOUNTER — Ambulatory Visit (HOSPITAL_COMMUNITY)
Admission: RE | Admit: 2023-06-02 | Discharge: 2023-06-02 | Disposition: A | Discharge status: Home / Self Care | Payer: Medicare Other | Source: Ambulatory Visit | Attending: Nephrology | Admitting: Nephrology

## 2023-06-02 DIAGNOSIS — N189 Chronic kidney disease, unspecified: Secondary | ICD-10-CM | POA: Diagnosis not present

## 2023-06-02 DIAGNOSIS — N1831 Chronic kidney disease, stage 3a: Secondary | ICD-10-CM | POA: Diagnosis not present

## 2023-06-03 NOTE — Telephone Encounter (Signed)
 Jessica Woodward

## 2023-06-03 NOTE — Telephone Encounter (Signed)
 Pt ready for scheduling for EVENITY  on or after : 06/03/23  Out-of-pocket cost due at time of visit: $257 (DEDUCTIBLE)  Number of injection/visits approved: 12  Primary: MEDICARE Prolia  co-insurance: 0% Admin fee co-insurance: 0%  Secondary: AARP-MEDSUP Prolia  co-insurance: Plan will consider the Medicare Part B co-insurance but does not cover the Medicare Part B deductible Admin fee co-insurance:   Medical Benefit Details: Date Benefits were checked: 06/01/23 Deductible: $0 Met of $257 Required/ Coinsurance: 0%/ Admin Fee: 0%  Prior Auth: N/A PA# Expiration Date:  # of doses approved:  Pharmacy benefit: Copay $--- If patient wants fill through the pharmacy benefit please send prescription to:  --- , and include estimated need by date in rx notes. Pharmacy will ship medication directly to the office.  Patient NOT eligible for Evenity  Copay Card. Copay Card can make patient's cost as little as $25. Link to apply: [email or web address]://[email or web address].[email or web address]  ** This summary of benefits is an estimation of the patient's out-of-pocket cost. Exact cost may very based on individual plan coverage.

## 2023-06-07 DIAGNOSIS — M545 Low back pain, unspecified: Secondary | ICD-10-CM | POA: Diagnosis not present

## 2023-06-09 ENCOUNTER — Other Ambulatory Visit: Payer: Self-pay

## 2023-06-09 ENCOUNTER — Other Ambulatory Visit: Payer: Self-pay | Admitting: Internal Medicine

## 2023-06-09 ENCOUNTER — Other Ambulatory Visit (HOSPITAL_COMMUNITY): Payer: Self-pay | Admitting: Pharmacy Technician

## 2023-06-09 ENCOUNTER — Ambulatory Visit: Payer: Medicare Other

## 2023-06-09 ENCOUNTER — Other Ambulatory Visit (HOSPITAL_COMMUNITY): Payer: Self-pay

## 2023-06-09 ENCOUNTER — Encounter (HOSPITAL_COMMUNITY): Payer: Self-pay

## 2023-06-09 DIAGNOSIS — M810 Age-related osteoporosis without current pathological fracture: Secondary | ICD-10-CM

## 2023-06-09 DIAGNOSIS — M545 Low back pain, unspecified: Secondary | ICD-10-CM | POA: Diagnosis not present

## 2023-06-09 MED ORDER — DENOSUMAB 60 MG/ML ~~LOC~~ SOSY: 60.0000 mg | PREFILLED_SYRINGE | Freq: Once | SUBCUTANEOUS | 0 refills | Status: DC

## 2023-06-09 MED ORDER — DENOSUMAB 60 MG/ML ~~LOC~~ SOSY
60.0000 mg | PREFILLED_SYRINGE | Freq: Once | SUBCUTANEOUS | 0 refills | Status: AC
  Filled 2023-06-09: qty 1, 1d supply, fill #0
  Filled 2023-06-09 – 2023-06-10 (×2): qty 1, 180d supply, fill #0

## 2023-06-10 ENCOUNTER — Other Ambulatory Visit: Payer: Self-pay

## 2023-06-10 NOTE — Progress Notes (Signed)
Patient will not be getting Prolia injection due to high copay. Copay is [phone] with the option to enroll in medicare payment plan-patient does not want that option. She will be reaching out to provider to change medication. Dis-enrolling

## 2023-06-14 DIAGNOSIS — M545 Low back pain, unspecified: Secondary | ICD-10-CM | POA: Diagnosis not present

## 2023-06-15 ENCOUNTER — Telehealth: Payer: Self-pay | Admitting: Internal Medicine

## 2023-06-15 ENCOUNTER — Telehealth: Payer: Self-pay

## 2023-06-15 NOTE — Telephone Encounter (Signed)
Copied from CRM [id number]. Topic: Clinical - Medication Question >> Jun 14, 2023  4:10 PM Irine Seal wrote: Reason for CRM: Pt stated provider wants her to get prolia shots, she is wanting to know if medicare approves that since last she checked it was too expensive, callback [phone]

## 2023-06-15 NOTE — Telephone Encounter (Signed)
 ERROR

## 2023-06-15 NOTE — Telephone Encounter (Signed)
Unable to reach patient. Unable to leave message.

## 2023-06-15 NOTE — Telephone Encounter (Signed)
New encounter created for Prolia VOB

## 2023-06-15 NOTE — Telephone Encounter (Signed)
Prolia VOB initiated via [email or web address] 

## 2023-06-15 NOTE — Telephone Encounter (Signed)
Pt ready for scheduling for PROLIA on or after : 06/15/23  Out-of-pocket cost due at time of visit: $0  Number of injection/visits approved: ---  Primary: MEDICARE Prolia co-insurance: 0% Admin fee co-insurance: 0%  Secondary: AARP-MEDSUP Prolia co-insurance:  Admin fee co-insurance:   Medical Benefit Details: Date Benefits were checked: 06/15/23 Deductible: $257 Met of $257 Required/ Coinsurance: 0%/ Admin Fee: 0%  Prior Auth: N/A PA# Expiration Date:   # of doses approved:  Pharmacy benefit: Copay $--- If patient wants fill through the pharmacy benefit please send prescription to:  --- , and include estimated need by date in rx notes. Pharmacy will ship medication directly to the office.  Patient NOT eligible for Prolia Copay Card. Copay Card can make patient's cost as little as $25. Link to apply: [email or web address]://[email or web address]  ** This summary of benefits is an estimation of the patient's out-of-pocket cost. Exact cost may very based on individual plan coverage.

## 2023-06-15 NOTE — Telephone Encounter (Signed)
May you please start a PA for her Prolia.

## 2023-06-15 NOTE — Telephone Encounter (Signed)
Please help me! I have a note on my desk in regards to the patient's EVENITY . I see that She sent a message and I sent it to the PA team for Prolia. Please assist me with this.

## 2023-06-15 NOTE — Telephone Encounter (Signed)
 Marland Kitchen

## 2023-06-16 DIAGNOSIS — M545 Low back pain, unspecified: Secondary | ICD-10-CM | POA: Diagnosis not present

## 2023-06-21 DIAGNOSIS — M545 Low back pain, unspecified: Secondary | ICD-10-CM | POA: Diagnosis not present

## 2023-06-22 ENCOUNTER — Ambulatory Visit (INDEPENDENT_AMBULATORY_CARE_PROVIDER_SITE_OTHER): Payer: Medicare Other

## 2023-06-22 DIAGNOSIS — I442 Atrioventricular block, complete: Secondary | ICD-10-CM

## 2023-06-22 LAB — CUP PACEART REMOTE DEVICE CHECK
Battery Remaining Longevity: 100 mo
Battery Voltage: 3 V
Brady Statistic AP VP Percent: 8.92 %
Brady Statistic AP VS Percent: 0 %
Brady Statistic AS VP Percent: 91.07 %
Brady Statistic AS VS Percent: 0.01 %
Brady Statistic RA Percent Paced: 8.89 %
Brady Statistic RV Percent Paced: 99.99 %
Date Time Interrogation Session: 20250129060339
Implantable Lead Connection Status: 753985
Implantable Lead Connection Status: 753985
Implantable Lead Implant Date: 20210720
Implantable Lead Implant Date: 20210720
Implantable Lead Location: 753859
Implantable Lead Location: 753860
Implantable Lead Model: 3830
Implantable Lead Model: 5076
Implantable Pulse Generator Implant Date: 20210720
Lead Channel Impedance Value: 285 Ohm
Lead Channel Impedance Value: 361 Ohm
Lead Channel Impedance Value: 437 Ohm
Lead Channel Impedance Value: 551 Ohm
Lead Channel Pacing Threshold Amplitude: 0.625 V
Lead Channel Pacing Threshold Amplitude: 0.875 V
Lead Channel Pacing Threshold Pulse Width: 0.4 ms
Lead Channel Pacing Threshold Pulse Width: 0.4 ms
Lead Channel Sensing Intrinsic Amplitude: 2 mV
Lead Channel Sensing Intrinsic Amplitude: 2 mV
Lead Channel Sensing Intrinsic Amplitude: 9 mV
Lead Channel Sensing Intrinsic Amplitude: 9 mV
Lead Channel Setting Pacing Amplitude: 1.5 V
Lead Channel Setting Pacing Amplitude: 2.5 V
Lead Channel Setting Pacing Pulse Width: 0.4 ms
Lead Channel Setting Sensing Sensitivity: 2 mV
Zone Setting Status: 755011
Zone Setting Status: 755011

## 2023-06-23 DIAGNOSIS — M545 Low back pain, unspecified: Secondary | ICD-10-CM | POA: Diagnosis not present

## 2023-06-28 DIAGNOSIS — M545 Low back pain, unspecified: Secondary | ICD-10-CM | POA: Diagnosis not present

## 2023-07-05 ENCOUNTER — Ambulatory Visit: Payer: Medicare Other | Admitting: Internal Medicine

## 2023-07-05 ENCOUNTER — Ambulatory Visit (INDEPENDENT_AMBULATORY_CARE_PROVIDER_SITE_OTHER): Payer: Medicare Other

## 2023-07-05 VITALS — Ht 64.0 in | Wt 145.0 lb

## 2023-07-05 DIAGNOSIS — Z0000 Encounter for general adult medical examination without abnormal findings: Secondary | ICD-10-CM

## 2023-07-05 NOTE — Patient Instructions (Addendum)
Jessica Woodward , Thank you for taking time to come for your Medicare Wellness Visit. I appreciate your ongoing commitment to your health goals. Please review the following plan we discussed and let me know if I can assist you in the future.   Referrals/Orders/Follow-Ups/Clinician Recommendations: Aim for 30 minutes of exercise or brisk walking, 6-8 glasses of water, and 5 servings of fruits and vegetables each day.  This is a list of the screening recommended for you and due dates:  Health Maintenance  Topic Date Due   Medicare Annual Wellness Visit  07/04/2024   DTaP/Tdap/Td vaccine (4 - Td or Tdap) 03/06/2030   Pneumonia Vaccine  Completed   Flu Shot  Completed   DEXA scan (bone density measurement)  Completed   Zoster (Shingles) Vaccine  Completed   HPV Vaccine  Aged Out   COVID-19 Vaccine  Discontinued   Hepatitis C Screening  Discontinued    Advanced directives: (Copy Requested) Please bring a copy of your health care power of attorney and living will to the office to be added to your chart at your convenience.  Next Medicare Annual Wellness Visit scheduled for next year: Yes - 07/05/2024

## 2023-07-05 NOTE — Progress Notes (Signed)
Subjective:   Jessica Woodward is a 83 y.o. female who presents for Medicare Annual (Subsequent) preventive examination.  Visit Complete: Virtual I connected with  April Holding on 07/05/23 by a audio enabled telemedicine application and verified that I am speaking with the correct person using two identifiers.  Patient Location: Home  Provider Location: Office/Clinic  I discussed the limitations of evaluation and management by telemedicine. The patient expressed understanding and agreed to proceed.  Vital Signs: Because this visit was a virtual/telehealth visit, some criteria may be missing or patient reported. Any vitals not documented were not able to be obtained and vitals that have been documented are patient reported.    Cardiac Risk Factors include: advanced age (>57men, >56 women);hypertension;dyslipidemia    Objective:    Today's Vitals   07/05/23 0849  Weight: 145 lb (65.8 kg)  Height: 5\' 4"  (1.626 m)   Body mass index is 24.89 kg/m.     07/05/2023    8:44 AM 05/10/2022    7:44 PM 03/26/2022    3:43 PM 10/08/2021    7:33 PM 03/24/2021    9:30 AM 04/24/2020    1:43 AM 12/15/2019    5:21 AM  Advanced Directives  Does Patient Have a Medical Advance Directive? Yes No Yes No Yes Yes   Type of Estate agent of [place];Living will  Healthcare Power of [place];Living will  Living will;Healthcare Power of [employer] Power of [place];Living will Healthcare Power of Attorney  Does patient want to make changes to medical advance directive?   No - Patient declined  No - Patient declined  No - Patient declined  Copy of Healthcare Power of Attorney in Chart? No - copy requested  No - copy requested  No - copy requested    Would patient like information on creating a medical advance directive?  No - Patient declined  No - Patient declined       Current Medications (verified) Outpatient Encounter Medications as of 07/05/2023  Medication Sig    acetaminophen (TYLENOL) 325 MG tablet Take 1-2 tablets (325-650 mg total) by mouth every 4 (four) hours as needed for mild pain.   atorvastatin (LIPITOR) 40 MG tablet Take 1 tablet (40 mg total) by mouth daily.   Cholecalciferol (VITAMIN D3) 250 MCG (10000 UT) TABS Take 1,000 Units by mouth daily.   irbesartan (AVAPRO) 300 MG tablet Take 1 tablet (300 mg total) by mouth daily.   loratadine (CLARITIN) 10 MG tablet Take 10 mg by mouth daily.   Multiple Vitamin (MULTIVITAMIN) tablet Take 1 tablet by mouth daily.   triamcinolone cream (KENALOG) 0.1 % Apply 1 application topically 4 (four) times daily as needed (rash).    zinc gluconate 50 MG tablet Take 1 tablet (50 mg total) by mouth daily.   Facility-Administered Encounter Medications as of 07/05/2023  Medication   Romosozumab-aqqg (EVENITY) 105 MG/1. injection 210 mg   [DISCONTINUED] Romosozumab-aqqg (EVENITY) 105 MG/1. injection 210 mg    Allergies (verified) Anastrozole, Evenity [romosozumab], Lisinopril, Sulfasalazine, Sulfonamide derivatives, and Lactose intolerance (gi)   History: Past Medical History:  Diagnosis Date   Allergy    Arthritis    Breast cancer (HCC)    Breast disorder    cancer   CARCINOMA, SQUAMOUS CELL, HX OF 06/05/2007   Cataract    Chronic kidney disease    DUCTAL CARCINOMA IN SITU, LEFT BREAST 2002   s/p left lumpectomy, xrt and tamoxifen   History of chemotherapy    Hyperlipemia  Hypertension    Personal history of chemotherapy 2002   Personal history of radiation therapy 2002   RADIATION THERAPY, HX OF    Seasonal allergies    Skin cancer    Past Surgical History:  Procedure Laterality Date   ABDOMINAL HYSTERECTOMY     athroscopy right knee  2013   torn miniscus   BREAST BIOPSY Left 2002   BREAST EXCISIONAL BIOPSY Right    BREAST LUMPECTOMY Left 2002   had chemo and radiation   BREAST SURGERY     Left breast lumpectomy with axillary sential node   DILATION AND CURETTAGE OF UTERUS      Left knee surgery     Dr Valma Cava   MOHS SURGERY     (R) arm   PACEMAKER IMPLANT N/A 12/11/2019   Procedure: PACEMAKER IMPLANT;  Surgeon: Marinus Maw, MD;  Location: [facility];  Service: Cardiovascular;  Laterality: N/A;   right breast lupectomy     TONSILLECTOMY AND ADENOIDECTOMY     TUBAL LIGATION     Family History  Problem Relation Age of Onset   Coronary artery disease Mother    Dementia Mother        Died at age 67   Thyroid disease Mother        hypothyroid   Macular degeneration Father    Coronary artery disease Father    Diabetes Father    Heart disease Father    Cancer Brother        pancreatic   Heart disease Maternal Grandmother    Kidney disease Maternal Grandmother    Heart disease Maternal Grandfather    Heart disease Paternal Grandmother    Diabetes Paternal Grandmother    Heart disease Paternal Grandfather    Hypertension Son    Heart attack Son    Hypertension Son    Social History   Socioeconomic History   Marital status: Widowed    Spouse name: Not on file   Number of children: 2   Years of education: Not on file   Highest education level: Associate degree: occupational, Scientist, product/process development, or vocational program  Occupational History   Occupation: retired  Tobacco Use   Smoking status: Former    Current packs/day: 0.00    Average packs/day: 0.3 packs/day for 2.0 years (0.5 ttl pk-yrs)    Types: Cigarettes    Quit date: 12/28/1963    Years since quitting: 59.5   Smokeless tobacco: Never  Vaping Use   Vaping status: Never Used  Substance and Sexual Activity   Alcohol use: Yes    Comment: rare glass of wine   Drug use: No   Sexual activity: Not Currently    Partners: Male    Birth control/protection: Surgical    Comment: hyst  Other Topics Concern   Not on file  Social History Narrative   HSG, [employer]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 married '60. 2 boys - '62, '73; 11 grandchildren; 3 great-grandchildren. Retired.    Social Drivers of Corporate investment banker Strain: Low Risk  (07/05/2023)   Overall Financial Resource Strain (CARDIA)    Difficulty of Paying Living Expenses: Not hard at all  Food Insecurity: No Food Insecurity (07/05/2023)   Hunger Vital Sign    Worried About Running Out of Food in the Last Year: Never true    Ran Out of Food in the Last Year: Never true  Transportation Needs: No Transportation Needs (07/05/2023)   PRAPARE - Transportation  Lack of Transportation (Medical): No    Lack of Transportation (Non-Medical): No  Physical Activity: Sufficiently Active (07/05/2023)   Exercise Vital Sign    Days of Exercise per Week: 5 days    Minutes of Exercise per Session: 60 min  Stress: No Stress Concern Present (07/05/2023)   [employer] of Occupational Health - Occupational Stress Questionnaire    Feeling of Stress : Not at all  Social Connections: Moderately Integrated (07/05/2023)   Social Connection and Isolation Panel [NHANES]    Frequency of Communication with Friends and Family: More than three times a week    Frequency of Social Gatherings with Friends and Family: More than three times a week    Attends Religious Services: More than 4 times per year    Active Member of [employer] or Organizations: Yes    Attends Banker Meetings: More than 4 times per year    Marital Status: Widowed    Tobacco Counseling Counseling given: Not Answered   Clinical Intake:  Pre-visit preparation completed: Yes  Pain : No/denies pain     BMI - recorded: 24.89 Nutritional Status: BMI of 19-24  Normal Nutritional Risks: None Diabetes: No  How often do you need to have someone help you when you read instructions, pamphlets, or other written materials from your doctor or pharmacy?: 1 - Never  Interpreter Needed?: No  Information entered by :: Hassell Halim, CMA   Activities of Daily Living    07/05/2023    8:52 AM  In your present state of health, do you have  any difficulty performing the following activities:  Hearing? 0  Vision? 0  Difficulty concentrating or making decisions? 0  Walking or climbing stairs? 0  Dressing or bathing? 0  Doing errands, shopping? 0  Preparing Food and eating ? N  Using the Toilet? N  In the past six months, have you accidently leaked urine? N  Do you have problems with loss of bowel control? N  Managing your Medications? N  Managing your Finances? N  Housekeeping or managing your Housekeeping? N    Patient Care Team: Etta Grandchild, MD as PCP - General (Internal Medicine) Adline Potter, NP as PCP - OBGYN (Obstetrics and Gynecology) Marinus Maw, MD as PCP - Electrophysiology (Cardiology) Magrinat, Valentino Hue, MD (Inactive) (Hematology and Oncology) Eugenia Mcalpine, MD (Inactive) (Orthopedic Surgery) Venancio Poisson, MD (Dermatology) Sallye Lat, MD as Consulting Physician (Ophthalmology) Sallye Lat, MD as Consulting Physician (Ophthalmology)  Indicate any recent Medical Services you may have received from other than [facility] providers in the past year (date may be approximate).     Assessment:   This is a routine wellness examination for Alysandra.  Hearing/Vision screen Hearing Screening - Comments:: Wearing hearing aids - Dr Suszanne Conners (ENT) Vision Screening - Comments:: Wears rx glasses - up to date with routine eye exams with  Dr Marchelle Gearing   Goals Addressed               This Visit's Progress     Patient Stated (pt-stated)        Patient stated she'd like to lose about 5lbs and continue being active.         Depression Screen    07/05/2023    8:58 AM 04/12/2023   10:40 AM 02/10/2023    9:46 AM 05/20/2022   10:24 AM 03/26/2022    3:47 PM 10/09/2021    3:58 PM 03/24/2021    9:45 AM  PHQ 2/9  Scores  PHQ - 2 Score 0 0 0 0 0 0 0  PHQ- 9 Score  1 1 0  2     Fall Risk    07/05/2023    8:54 AM 04/12/2023   10:39 AM 02/10/2023    9:45 AM 05/20/2022   10:24 AM 03/26/2022     3:46 PM  Fall Risk   Falls in the past year? 0 0 0 0 0  Number falls in past yr: 0 0 0 0 0  Injury with Fall? 0 0 0 0 0  Risk for fall due to : No Fall Risks No Fall Risks No Fall Risks  No Fall Risks  Follow up Falls prevention discussed;Falls evaluation completed Falls evaluation completed Falls evaluation completed Falls evaluation completed Falls evaluation completed    MEDICARE RISK AT HOME: Medicare Risk at Home Any stairs in or around the home?: Yes If so, are there any without handrails?: No Home free of loose throw rugs in walkways, pet beds, electrical cords, etc?: Yes Adequate lighting in your home to reduce risk of falls?: Yes Life alert?: No Use of a cane, walker or w/c?: No Grab bars in the bathroom?: Yes Shower chair or bench in shower?: Yes Elevated toilet seat or a handicapped toilet?: Yes  TIMED UP AND GO:  Was the test performed?  No    Cognitive Function:    12/25/2015   10:30 AM  MMSE - Mini Mental State Exam  Not completed: --        07/05/2023    8:55 AM 03/26/2022    3:43 PM 11/12/2019   10:48 AM  6CIT Screen  What Year? 0 points 0 points 0 points  What month? 0 points 0 points 0 points  What time? 0 points 0 points 0 points  Count back from 20 0 points 0 points 0 points  Months in reverse 0 points 0 points 0 points  Repeat phrase 0 points 0 points 0 points  Total Score 0 points 0 points 0 points    Immunizations Immunization History  Administered Date(s) Administered   Fluad Quad(high Dose 65+) 02/14/2019, 01/21/2021, 02/22/2022   Fluad Trivalent(High Dose 65+) 02/10/2023   Influenza Whole 03/17/2009, 02/28/2012   Influenza, High Dose Seasonal PF 02/17/2015, 02/12/2016, 02/01/2017, 01/31/2018   Influenza,inj,Quad PF,6+ Mos 02/27/2013, 03/20/2014   Influenza-Unspecified 02/17/2015   PFIZER(Purple Top)SARS-COV-2 Vaccination 06/07/2019, 06/26/2019, 02/07/2020, 09/04/2020, 03/13/2021   Pneumococcal Conjugate-13 03/28/2013   Pneumococcal  Polysaccharide-23 08/23/2004, 01/07/2010, 04/15/2015, 05/27/2022   Td 07/08/1998, 01/07/2010   Tdap 03/06/2020   Zoster Recombinant(Shingrix) 11/05/2020, 02/27/2021   Zoster, Live 03/14/2006    TDAP status: Up to date - 03/06/20  Flu Vaccine status: Up to date - 02/10/23  Pneumococcal vaccine status: Up to date - 05/27/22  Covid-19 vaccine status: Declined, Education has been provided regarding the importance of this vaccine but patient still declined. Advised may receive this vaccine at local pharmacy or Health Dept.or vaccine clinic. Aware to provide a copy of the vaccination record if obtained from local pharmacy or Health Dept. Verbalized acceptance and understanding.  Qualifies for Shingles Vaccine? Yes   Zostavax completed Yes   Shingrix Completed?: Yes  Screening Tests Health Maintenance  Topic Date Due   Medicare Annual Wellness (AWV)  07/04/2024   DTaP/Tdap/Td (4 - Td or Tdap) 03/06/2030   Pneumonia Vaccine 26+ Years old  Completed   INFLUENZA VACCINE  Completed   DEXA SCAN  Completed   Zoster Vaccines- Shingrix  Completed  HPV VACCINES  Aged Out   COVID-19 Vaccine  Discontinued   Hepatitis C Screening  Discontinued    Health Maintenance  There are no preventive care reminders to display for this patient.   Colorectal cancer screening: No longer required.   Mammogram status: No longer required due to age 35 8.  Bone Density status: Completed 03/07/23. Results reflect: Bone density results: OSTEOPENIA. Repeat every 3 years.    Additional Screening:  Hepatitis C Screening: does not qualify  Vision Screening: Recommended annual ophthalmology exams for early detection of glaucoma and other disorders of the eye. Is the patient up to date with their annual eye exam?  Yes  Who is the provider or what is the name of the office in which the patient attends annual eye exams? Dr. Sallye Lat If pt is not established with a provider, would they like to be  referred to a provider to establish care? No .   Dental Screening: Recommended annual dental exams for proper oral hygiene   Community Resource Referral / Chronic Care Management: CRR required this visit?  No   CCM required this visit?  No     Plan:     I have personally reviewed and noted the following in the patient's chart:   Medical and social history Use of alcohol, tobacco or illicit drugs  Current medications and supplements including opioid prescriptions. Patient is not currently taking opioid prescriptions. Functional ability and status Nutritional status Physical activity Advanced directives List of other physicians Hospitalizations, surgeries, and ER visits in previous 12 months Vitals Screenings to include cognitive, depression, and falls Referrals and appointments  In addition, I have reviewed and discussed with patient certain preventive protocols, quality metrics, and best practice recommendations. A written personalized care plan for preventive services as well as general preventive health recommendations were provided to patient.     Darreld Mclean, CMA   07/05/2023   After Visit Summary: (MyChart) Due to this being a telephonic visit, the after visit summary with patients personalized plan was offered to patient via MyChart   Nurse Notes: none

## 2023-08-01 NOTE — Progress Notes (Signed)
 Remote pacemaker transmission.

## 2023-08-05 ENCOUNTER — Encounter (INDEPENDENT_AMBULATORY_CARE_PROVIDER_SITE_OTHER): Payer: Self-pay

## 2023-08-05 ENCOUNTER — Telehealth (INDEPENDENT_AMBULATORY_CARE_PROVIDER_SITE_OTHER): Payer: Self-pay | Admitting: Otolaryngology

## 2023-08-05 NOTE — Telephone Encounter (Signed)
 No VM, unable to leave a message; Sent a MyChart message w/ appt date, time & location - tlt

## 2023-08-08 ENCOUNTER — Encounter: Payer: Self-pay | Admitting: Internal Medicine

## 2023-08-08 ENCOUNTER — Ambulatory Visit (INDEPENDENT_AMBULATORY_CARE_PROVIDER_SITE_OTHER): Payer: Medicare Other | Admitting: Otolaryngology

## 2023-08-08 ENCOUNTER — Ambulatory Visit (INDEPENDENT_AMBULATORY_CARE_PROVIDER_SITE_OTHER): Payer: Medicare Other | Admitting: Internal Medicine

## 2023-08-08 ENCOUNTER — Telehealth: Payer: Self-pay

## 2023-08-08 ENCOUNTER — Encounter (INDEPENDENT_AMBULATORY_CARE_PROVIDER_SITE_OTHER): Payer: Self-pay

## 2023-08-08 VITALS — BP 132/78 | HR 62 | Temp 97.8°F | Resp 16 | Ht 64.0 in | Wt 148.6 lb

## 2023-08-08 VITALS — BP 120/81 | HR 76 | Ht 64.0 in | Wt 145.0 lb

## 2023-08-08 DIAGNOSIS — T162XXA Foreign body in left ear, initial encounter: Secondary | ICD-10-CM

## 2023-08-08 DIAGNOSIS — M810 Age-related osteoporosis without current pathological fracture: Secondary | ICD-10-CM | POA: Diagnosis not present

## 2023-08-08 DIAGNOSIS — E785 Hyperlipidemia, unspecified: Secondary | ICD-10-CM

## 2023-08-08 DIAGNOSIS — I700 Atherosclerosis of aorta: Secondary | ICD-10-CM | POA: Diagnosis not present

## 2023-08-08 DIAGNOSIS — I10 Essential (primary) hypertension: Secondary | ICD-10-CM

## 2023-08-08 DIAGNOSIS — H903 Sensorineural hearing loss, bilateral: Secondary | ICD-10-CM

## 2023-08-08 LAB — HEPATIC FUNCTION PANEL
ALT: 14 U/L (ref 0–35)
AST: 18 U/L (ref 0–37)
Albumin: 4.5 g/dL (ref 3.5–5.2)
Alkaline Phosphatase: 108 U/L (ref 39–117)
Bilirubin, Direct: 0.1 mg/dL (ref 0.0–0.3)
Total Bilirubin: 0.8 mg/dL (ref 0.2–1.2)
Total Protein: 6.9 g/dL (ref 6.0–8.3)

## 2023-08-08 LAB — LIPID PANEL
Cholesterol: 183 mg/dL (ref 0–200)
HDL: 73.1 mg/dL (ref >39.00)
LDL Cholesterol: 91 mg/dL (ref 0–99)
NonHDL: 109.44
Total CHOL/HDL Ratio: 2
Triglycerides: 90 mg/dL (ref 0.0–149.0)
VLDL: 18 mg/dL (ref 0.0–40.0)

## 2023-08-08 LAB — TSH: TSH: 3.52 u[IU]/mL (ref 0.35–5.50)

## 2023-08-08 MED ORDER — DENOSUMAB 60 MG/ML ~~LOC~~ SOSY
60.0000 mg | PREFILLED_SYRINGE | Freq: Once | SUBCUTANEOUS | Status: AC
  Administered 2024-02-22: 60 mg via SUBCUTANEOUS

## 2023-08-08 MED ORDER — DENOSUMAB 60 MG/ML ~~LOC~~ SOSY
60.0000 mg | PREFILLED_SYRINGE | Freq: Once | SUBCUTANEOUS | Status: AC
  Administered 2023-08-08: 60 mg via SUBCUTANEOUS

## 2023-08-08 NOTE — Telephone Encounter (Signed)
 Looks like back in January a [place] PA was supposed to be started I don't see anything in her chart. Please advise. Patient needs her medication.

## 2023-08-08 NOTE — Progress Notes (Unsigned)
 Subjective:  Patient ID: Jessica Woodward, female    DOB: 06-Feb-1941  Age: 83 y.o. MRN: [id number]  CC: Hypertension and Hyperlipidemia   HPI [name] presents for f/up ----    Discussed the use of AI scribe software for clinical note transcription with the patient, who gave verbal consent to proceed.  History of Present Illness   Jessica MATSUO "Jessica Woodward" is a 83 year old female with osteoporosis who presents for follow-up regarding medication management.  She experienced a severe reaction to Evenity, characterized by muscle spasms, which necessitated a switch to Prolia. She has not yet started Prolia and is in the process of setting up for administration. Her last bone density test was six months ago. She engages in weight-bearing exercises as advised by her physical therapist to improve bone density. She joined the [employer] and works with a Psychologist, educational to guide her exercise regimen, which she feels has improved her flexibility and endurance.  She takes two 500 mg Tylenol every morning to manage backache, which she describes as a persistent sore spot. The Tylenol helps alleviate the discomfort.  No chest pain, shortness of breath, dizziness, lightheadedness, or swelling in her legs or feet. No side effects from her blood pressure medication. She denies any recent EKGs, although her last one was in September of the previous year.  She takes a multivitamin but does not take additional supplements such as zinc, B12, or folate. She was advised by [facility] that she does not need to take vitamin D or calcium.       Outpatient Medications Prior to Visit  Medication Sig Dispense Refill   acetaminophen (TYLENOL) 325 MG tablet Take 1-2 tablets (325-650 mg total) by mouth every 4 (four) hours as needed for mild pain.     atorvastatin (LIPITOR) 40 MG tablet Take 1 tablet (40 mg total) by mouth daily. 90 tablet 1   irbesartan (AVAPRO) 300 MG tablet Take 1 tablet (300 mg total) by mouth daily. 90  tablet 1   loratadine (CLARITIN) 10 MG tablet Take 10 mg by mouth daily.     Multiple Vitamin (MULTIVITAMIN) tablet Take 1 tablet by mouth daily.     triamcinolone cream (KENALOG) 0.1 % Apply 1 application topically 4 (four) times daily as needed (rash).      zinc gluconate 50 MG tablet Take 1 tablet (50 mg total) by mouth daily. 90 tablet 1   Cholecalciferol (VITAMIN D3) 250 MCG (10000 UT) TABS Take 1,000 Units by mouth daily.     Facility-Administered Medications Prior to Visit  Medication Dose Route Frequency Provider Last Rate Last Admin   Romosozumab-aqqg (EVENITY) 105 MG/1. injection 210 mg  210 mg Subcutaneous Once Etta Grandchild, MD        ROS Review of Systems  Objective:  BP 132/78 (BP Location: Left Arm, Patient Position: Sitting, Cuff Size: Small)   Pulse 62   Temp 97.8 F (36.6 C) (Oral)   Resp 16   Ht 5\' 4"  (1.626 m)   Wt 148 lb 9.6 oz (67.4 kg)   SpO2 97%   BMI 25.51 kg/m   BP Readings from Last 3 Encounters:  08/08/23 120/81  08/08/23 132/78  04/12/23 (!) 138/90    Wt Readings from Last 3 Encounters:  08/08/23 145 lb (65.8 kg)  08/08/23 148 lb 9.6 oz (67.4 kg)  07/05/23 145 lb (65.8 kg)    Physical Exam Vitals reviewed.  Constitutional:      Appearance: Normal appearance.  HENT:     Nose: Nose normal.     Mouth/Throat:     Mouth: Mucous membranes are moist.  Eyes:     General: No scleral icterus.    Conjunctiva/sclera: Conjunctivae normal.  Cardiovascular:     Rate and Rhythm: Normal rate and regular rhythm.     Heart sounds: No murmur heard.    No friction rub. No gallop.     Comments: EKG  ---  Atrial-sensed ventricular-paced rhythm Unchanged Pulmonary:     Effort: Pulmonary effort is normal.     Breath sounds: No stridor. No wheezing, rhonchi or rales.  Abdominal:     General: Abdomen is flat.     Palpations: There is no mass.     Tenderness: There is no abdominal tenderness. There is no guarding.     Hernia: No hernia is  present.  Musculoskeletal:        General: Normal range of motion.     Cervical back: Neck supple.     Right lower leg: No edema.     Left lower leg: No edema.  Lymphadenopathy:     Cervical: No cervical adenopathy.  Skin:    General: Skin is warm and dry.  Neurological:     General: No focal deficit present.     Mental Status: She is alert and oriented to person, place, and time. Mental status is at baseline.  Psychiatric:        Mood and Affect: Mood normal.        Behavior: Behavior normal.     Lab Results  Component Value Date   WBC 6.6 05/31/2023   HGB 14.5 05/31/2023   HCT 43 05/31/2023   PLT 306 05/31/2023   GLUCOSE 92 02/10/2023   CHOL 183 08/08/2023   TRIG 90.0 08/08/2023   HDL 73.10 08/08/2023   LDLDIRECT 166.9 03/23/2013   LDLCALC 91 08/08/2023   ALT 14 08/08/2023   AST 18 08/08/2023   NA 141 05/31/2023   K 4.5 05/31/2023   CL 106 05/31/2023   CREATININE 0.8 05/31/2023   BUN 17 05/31/2023   CO2 21 05/31/2023   TSH 3.52 08/08/2023   HGBA1C 5.8 10/28/2021    US RENAL Result Date: 06/02/2023 CLINICAL DATA:  Chronic renal disease EXAM: RENAL / URINARY TRACT ULTRASOUND COMPLETE COMPARISON:  None Available. FINDINGS: Right Kidney: Renal measurements: 8.7 x 3.8 x 4.4 cm = volume: 75 mL. Echogenicity within normal limits. No mass or hydronephrosis visualized. Left Kidney: Renal measurements: 9.4 x 4.3 x 4.5 cm = volume: 97 mL. Echogenicity within normal limits. No mass or hydronephrosis visualized. Bladder: Appears normal for degree of bladder distention. Other: None. IMPRESSION: No hydronephrosis. Electronically Signed   By: Annia Belt M.D.   On: 06/02/2023 21:24    Assessment & Plan:  Dyslipidemia, goal LDL below 130 -     Lipid panel; Future -     TSH; Future -     Hepatic function panel; Future  Essential hypertension, benign -     Hepatic function panel; Future -     EKG 12-Lead  Atherosclerosis of aorta (HCC) -     Lipid panel;  Future  Osteoporosis, unspecified osteoporosis type, unspecified pathological fracture presence -     Denosumab     Follow-up: Return in about 6 months (around 02/08/2024).  Sanda Linger, MD

## 2023-08-08 NOTE — Patient Instructions (Signed)
 Hypertension, Adult High blood pressure (hypertension) is when the force of blood pumping through the arteries is too strong. The arteries are the blood vessels that carry blood from the heart throughout the body. Hypertension forces the heart to work harder to pump blood and may cause arteries to become narrow or stiff. Untreated or uncontrolled hypertension can lead to a heart attack, heart failure, a stroke, kidney disease, and other problems. A blood pressure reading consists of a higher number over a lower number. Ideally, your blood pressure should be below 120/80. The first ("top") number is called the systolic pressure. It is a measure of the pressure in your arteries as your heart beats. The second ("bottom") number is called the diastolic pressure. It is a measure of the pressure in your arteries as the heart relaxes. What are the causes? The exact cause of this condition is not known. There are some conditions that result in high blood pressure. What increases the risk? Certain factors may make you more likely to develop high blood pressure. Some of these risk factors are under your control, including: Smoking. Not getting enough exercise or physical activity. Being overweight. Having too much fat, sugar, calories, or salt (sodium) in your diet. Drinking too much alcohol. Other risk factors include: Having a personal history of heart disease, diabetes, high cholesterol, or kidney disease. Stress. Having a family history of high blood pressure and high cholesterol. Having obstructive sleep apnea. Age. The risk increases with age. What are the signs or symptoms? High blood pressure may not cause symptoms. Very high blood pressure (hypertensive crisis) may cause: Headache. Fast or irregular heartbeats (palpitations). Shortness of breath. Nosebleed. Nausea and vomiting. Vision changes. Severe chest pain, dizziness, and seizures. How is this diagnosed? This condition is diagnosed by  measuring your blood pressure while you are seated, with your arm resting on a flat surface, your legs uncrossed, and your feet flat on the floor. The cuff of the blood pressure monitor will be placed directly against the skin of your upper arm at the level of your heart. Blood pressure should be measured at least twice using the same arm. Certain conditions can cause a difference in blood pressure between your right and left arms. If you have a high blood pressure reading during one visit or you have normal blood pressure with other risk factors, you may be asked to: Return on a different day to have your blood pressure checked again. Monitor your blood pressure at home for 1 week or longer. If you are diagnosed with hypertension, you may have other blood or imaging tests to help your health care provider understand your overall risk for other conditions. How is this treated? This condition is treated by making healthy lifestyle changes, such as eating healthy foods, exercising more, and reducing your alcohol intake. You may be referred for counseling on a healthy diet and physical activity. Your health care provider may prescribe medicine if lifestyle changes are not enough to get your blood pressure under control and if: Your systolic blood pressure is above 130. Your diastolic blood pressure is above 80. Your personal target blood pressure may vary depending on your medical conditions, your age, and other factors. Follow these instructions at home: Eating and drinking  Eat a diet that is high in fiber and potassium, and low in sodium, added sugar, and fat. An example of this eating plan is called the DASH diet. DASH stands for Dietary Approaches to Stop Hypertension. To eat this way: Eat  plenty of fresh fruits and vegetables. Try to fill one half of your plate at each meal with fruits and vegetables. Eat whole grains, such as whole-wheat pasta, brown rice, or whole-grain bread. Fill about one  fourth of your plate with whole grains. Eat or drink low-fat dairy products, such as skim milk or low-fat yogurt. Avoid fatty cuts of meat, processed or cured meats, and poultry with skin. Fill about one fourth of your plate with lean proteins, such as fish, chicken without skin, beans, eggs, or tofu. Avoid pre-made and processed foods. These tend to be higher in sodium, added sugar, and fat. Reduce your daily sodium intake. Many people with hypertension should eat less than 1,500 mg of sodium a day. Do not drink alcohol if: Your health care provider tells you not to drink. You are pregnant, may be pregnant, or are planning to become pregnant. If you drink alcohol: Limit how much you have to: 0-1 drink a day for women. 0-2 drinks a day for men. Know how much alcohol is in your drink. In the [place] one drink equals one 12 oz bottle of beer (355 mL), one 5 oz glass of wine (148 mL), or one 1 oz glass of hard liquor (44 mL). Lifestyle  Work with your health care provider to maintain a healthy body weight or to lose weight. Ask what an ideal weight is for you. Get at least 30 minutes of exercise that causes your heart to beat faster (aerobic exercise) most days of the week. Activities may include walking, swimming, or biking. Include exercise to strengthen your muscles (resistance exercise), such as Pilates or lifting weights, as part of your weekly exercise routine. Try to do these types of exercises for 30 minutes at least 3 days a week. Do not use any products that contain nicotine or tobacco. These products include cigarettes, chewing tobacco, and vaping devices, such as e-cigarettes. If you need help quitting, ask your health care provider. Monitor your blood pressure at home as told by your health care provider. Keep all follow-up visits. This is important. Medicines Take over-the-counter and prescription medicines only as told by your health care provider. Follow directions carefully. Blood  pressure medicines must be taken as prescribed. Do not skip doses of blood pressure medicine. Doing this puts you at risk for problems and can make the medicine less effective. Ask your health care provider about side effects or reactions to medicines that you should watch for. Contact a health care provider if you: Think you are having a reaction to a medicine you are taking. Have headaches that keep coming back (recurring). Feel dizzy. Have swelling in your ankles. Have trouble with your vision. Get help right away if you: Develop a severe headache or confusion. Have unusual weakness or numbness. Feel faint. Have severe pain in your chest or abdomen. Vomit repeatedly. Have trouble breathing. These symptoms may be an emergency. Get help right away. Call 911. Do not wait to see if the symptoms will go away. Do not drive yourself to the hospital. Summary Hypertension is when the force of blood pumping through your arteries is too strong. If this condition is not controlled, it may put you at risk for serious complications. Your personal target blood pressure may vary depending on your medical conditions, your age, and other factors. For most people, a normal blood pressure is less than 120/80. Hypertension is treated with lifestyle changes, medicines, or a combination of both. Lifestyle changes include losing weight, eating a healthy,  low-sodium diet, exercising more, and limiting alcohol. This information is not intended to replace advice given to you by your health care provider. Make sure you discuss any questions you have with your health care provider. Document Revised: 03/17/2021 Document Reviewed: 03/17/2021 Elsevier Patient Education  2024 [employer].

## 2023-08-09 DIAGNOSIS — T162XXA Foreign body in left ear, initial encounter: Secondary | ICD-10-CM | POA: Insufficient documentation

## 2023-08-09 NOTE — Progress Notes (Signed)
 Patient ID: Jessica Woodward, female   DOB: 03-12-1941, 83 y.o.   MRN: [id number]  Follow-up: Hearing loss  HPI: The patient is an 83 year old female who returns today for her follow-up evaluation.  The patient was previously seen for bilateral symmetric high-frequency sensorineural hearing loss, secondary to presbycusis.  She was fitted with bilateral hearing aids.  The patient returns today reporting improvement in her hearing with her hearing aids.  She denies any otalgia, otorrhea, or vertigo.  She uses her hearing aids daily.  Exam: General: Communicates without difficulty, well nourished, no acute distress. Head: Normocephalic, no evidence injury, no tenderness, facial buttresses intact without stepoff. Face/sinus: No tenderness to palpation and percussion. Facial movement is normal and symmetric. Eyes: PERRL, EOMI. No scleral icterus, conjunctivae clear. Neuro: CN II exam reveals vision grossly intact.  No nystagmus at any point of gaze. Ears: Auricles well formed without lesions.  Ear canals are intact without mass or lesion.  A foreign body is noted to be impacted within the medial aspect of the left ear canal.  Nose: External evaluation reveals normal support and skin without lesions.  Dorsum is intact.  Anterior rhinoscopy reveals congested mucosa over anterior aspect of inferior turbinates and intact septum.  No purulence noted. Oral:  Oral cavity and oropharynx are intact, symmetric, without erythema or edema.  Mucosa is moist without lesions. Neck: Full range of motion without pain.  There is no significant lymphadenopathy.  No masses palpable.  Thyroid bed within normal limits to palpation.  Parotid glands and submandibular glands equal bilaterally without mass.  Trachea is midline. Neuro:  CN 2-12 grossly intact.   Procedure: Left ear foreign body removal.   Anesthesia: None Description of the procedure: The patient is placed on a supine position.  Under the operating microscope, the left ear  canal is examined.  A hearing aid dome is noted to be impacted on the medial aspect of the ear canal.  Under the microscope, the foreign body is carefully removed with an alligator forceps and a 90 hook.  After foreign body removal, the tympanic membrane and middle ear space are noted to be normal.  The patient tolerated the procedure well.   Assessment: 1.  Left ear canal foreign body.  A hearing aid dome is noted to be impacted within the medial aspect of the left ear canal. 2.  Subjectively stable bilateral high-frequency sensorineural hearing loss.  Plan: 1.  Otomicroscopy with left ear foreign body removal. 2.  The physical exam findings are reviewed with the patient. 3.  Continue the use of her hearing aids. 4.  The patient will return for reevaluation in 1 year.

## 2023-08-09 NOTE — Telephone Encounter (Signed)
 Prolia was given yesterday.

## 2023-08-11 ENCOUNTER — Encounter: Payer: Self-pay | Admitting: Internal Medicine

## 2023-08-15 ENCOUNTER — Other Ambulatory Visit: Payer: Self-pay | Admitting: Internal Medicine

## 2023-08-15 DIAGNOSIS — I700 Atherosclerosis of aorta: Secondary | ICD-10-CM

## 2023-08-15 DIAGNOSIS — E785 Hyperlipidemia, unspecified: Secondary | ICD-10-CM

## 2023-09-08 ENCOUNTER — Ambulatory Visit: Payer: Medicare Other

## 2023-09-12 ENCOUNTER — Other Ambulatory Visit: Payer: Self-pay | Admitting: Internal Medicine

## 2023-09-12 DIAGNOSIS — Z1231 Encounter for screening mammogram for malignant neoplasm of breast: Secondary | ICD-10-CM

## 2023-09-21 ENCOUNTER — Ambulatory Visit: Payer: Medicare Other

## 2023-09-21 DIAGNOSIS — I442 Atrioventricular block, complete: Secondary | ICD-10-CM | POA: Diagnosis not present

## 2023-09-22 LAB — CUP PACEART REMOTE DEVICE CHECK
Battery Remaining Longevity: 100 mo
Battery Voltage: 2.99 V
Brady Statistic AP VP Percent: 13.67 %
Brady Statistic AP VS Percent: 0 %
Brady Statistic AS VP Percent: 86.3 %
Brady Statistic AS VS Percent: 0.03 %
Brady Statistic RA Percent Paced: 13.65 %
Brady Statistic RV Percent Paced: 99.97 %
Date Time Interrogation Session: 20250430065540
Implantable Lead Connection Status: 753985
Implantable Lead Connection Status: 753985
Implantable Lead Implant Date: 20210720
Implantable Lead Implant Date: 20210720
Implantable Lead Location: 753859
Implantable Lead Location: 753860
Implantable Lead Model: 3830
Implantable Lead Model: 5076
Implantable Pulse Generator Implant Date: 20210720
Lead Channel Impedance Value: 304 Ohm
Lead Channel Impedance Value: 380 Ohm
Lead Channel Impedance Value: 456 Ohm
Lead Channel Impedance Value: 608 Ohm
Lead Channel Pacing Threshold Amplitude: 0.625 V
Lead Channel Pacing Threshold Amplitude: 1 V
Lead Channel Pacing Threshold Pulse Width: 0.4 ms
Lead Channel Pacing Threshold Pulse Width: 0.4 ms
Lead Channel Sensing Intrinsic Amplitude: 2.5 mV
Lead Channel Sensing Intrinsic Amplitude: 2.5 mV
Lead Channel Sensing Intrinsic Amplitude: 9 mV
Lead Channel Sensing Intrinsic Amplitude: 9 mV
Lead Channel Setting Pacing Amplitude: 1.5 V
Lead Channel Setting Pacing Amplitude: 2.5 V
Lead Channel Setting Pacing Pulse Width: 0.4 ms
Lead Channel Setting Sensing Sensitivity: 2 mV
Zone Setting Status: 755011
Zone Setting Status: 755011

## 2023-09-25 ENCOUNTER — Encounter: Payer: Self-pay | Admitting: Internal Medicine

## 2023-09-26 ENCOUNTER — Other Ambulatory Visit: Payer: Self-pay | Admitting: Internal Medicine

## 2023-09-26 DIAGNOSIS — I10 Essential (primary) hypertension: Secondary | ICD-10-CM

## 2023-09-30 ENCOUNTER — Ambulatory Visit
Admission: RE | Admit: 2023-09-30 | Discharge: 2023-09-30 | Disposition: A | Discharge status: Home / Self Care | Source: Ambulatory Visit | Attending: Internal Medicine | Admitting: Internal Medicine

## 2023-09-30 DIAGNOSIS — Z1231 Encounter for screening mammogram for malignant neoplasm of breast: Secondary | ICD-10-CM

## 2023-10-04 ENCOUNTER — Ambulatory Visit (INDEPENDENT_AMBULATORY_CARE_PROVIDER_SITE_OTHER): Payer: Self-pay | Admitting: Audiology

## 2023-10-04 DIAGNOSIS — H903 Sensorineural hearing loss, bilateral: Secondary | ICD-10-CM

## 2023-10-04 NOTE — Progress Notes (Signed)
   [address] [place], [place] [address] [phone]  Hearing Aid Check     Jessica Woodward comes for a scheduled appointment for a hearing aid check.     Right Left  Hearing aid manufacturer Oticon More 3 miniRITE R [id number] Oticon More 3 miniRITE R [id number]  Hearing aid style Receiver in the ear Receiver in the ear  Hearing aid battery rechargeable rechargeable  Receiver    Dome/ custom earpiece    Retention wire yes yes  Warranty expiration date 07-16-2024 07-16-2024  Loss and Damage unknown unknown  Initial fitting date 06-19-2021 06-19-2021  Device was fit at: [facility]    Chief complaint: Patient reports the retention wire broke in one of her aids.  Actions taken: Cleaned both aids- replaced filters (one of them was missing), domes and retention wires. Patient said she was moving to [place], [place] with her son and will change practices. A copy of her hearing aid information was provided for her to take to where she decides to follow up at.  Services fee: $0 was paid at checkout. Patient was oriented about ideally running live speech mapping , but the equipment to do it is not available at this clinic (it was requested). The patient kept using the settings with which she had been fit with at [facility].   Recommend: Return for a hearing aid check ,as needed. Return for a hearing evaluation and to see an ENT, if concerns with hearing changes arise.    Jessica Woodward [name], AUD

## 2023-10-31 ENCOUNTER — Ambulatory Visit: Payer: Self-pay

## 2023-10-31 NOTE — Telephone Encounter (Signed)
  1st attempt, no answer. Left voicemail for patient to return call for nurse triage.    Copied from CRM [id number]. Topic: Clinical - Medical Advice >> Oct 31, 2023 12:03 PM [employer] wrote: Reason for CRM: Patient stated she was exposed to Covid and would like to know what she should do. No symptoms.   Callback #: [phone]

## 2023-10-31 NOTE — Telephone Encounter (Addendum)
 FYI Only or Action Required?: FYI only for provider  Patient was last seen in primary care on 08/08/2023 by Arcadio Knuckles, MD. Called Nurse Triage reporting Covid Exposure. Symptoms began 2 days ago. Interventions attempted: Nothing. Symptoms are: unchanged.  Triage Disposition: Home Care  Patient/caregiver understands and will follow disposition?: Yes, will follow disposition  Reason for Disposition  [1] COVID-19 EXPOSURE within last 14 days AND [2] NO symptoms  Answer Assessment - Initial Assessment Questions 1. COVID-19 EXPOSURE: "Please describe how you were exposed to someone with a COVID-19 infection."     Was in room for a couple of hours with the infected person 2. PLACE of CONTACT: "Where were you when you were exposed to COVID-19?" (e.g., home, school, medical waiting room; which city?)     Social gathering at home 3. TYPE of CONTACT: "How much contact was there?" (e.g., sitting next to, live in same house, work in same office, same building)     Same room for a few hours 4. DURATION of CONTACT: "How long were you in contact with the COVID-19 patient?" (e.g., a few seconds, passed by person, a few minutes, 15 minutes or longer, live with the patient)     2 hours Saturday morning 5. MASK: "Were you wearing a mask?" "Was the other person wearing a mask?" Note: wearing a mask reduces the risk of an otherwise close contact.     Denies mask 6. DATE of CONTACT: "When did you have contact with a COVID-19 patient?" (e.g., how many days ago)     2 days ago 8. SYMPTOMS: "Do you have any symptoms?" (e.g., fever, cough, breathing difficulty, loss of taste or smell)     denies 9. VACCINE: "Have you gotten the COVID-19 vaccine?" If Yes, ask: "Which one, how many shots, when did you get it?"     denies 11. HIGH RISK: "Do you have any heart or lung problems?" (e.g., asthma, COPD, heart failure) "Do you have a weak immune system or other risk factors?" (e.g., HIV positive, chemotherapy, renal  failure, diabetes mellitus, sickle cell anemia, obesity)       Has pacemaker  Protocols used: Coronavirus (COVID-19) Exposure-A-AH

## 2023-11-03 ENCOUNTER — Ambulatory Visit
Admission: RE | Admit: 2023-11-03 | Discharge: 2023-11-03 | Disposition: A | Discharge status: Home / Self Care | Source: Ambulatory Visit | Attending: Nurse Practitioner | Admitting: Nurse Practitioner

## 2023-11-03 VITALS — BP 115/71 | HR 76 | Temp 98.2°F | Resp 18

## 2023-11-03 DIAGNOSIS — Z20822 Contact with and (suspected) exposure to covid-19: Secondary | ICD-10-CM | POA: Diagnosis not present

## 2023-11-03 LAB — POC SARS CORONAVIRUS 2 AG -  ED: SARS Coronavirus 2 Ag: NEGATIVE

## 2023-11-03 NOTE — ED Triage Notes (Signed)
 Exposed to covid on Saturday.  Denies any symptoms.  Wants covid test.

## 2023-11-03 NOTE — Discharge Instructions (Addendum)
 Your COVID test was negative. Continue to monitor for symptoms.  If you develop symptoms recommend retesting, you may either follow-up in this clinic or purchase an over-the-counter test. As discussed, antiviral treatment must be started within the first 5 days of symptoms. Follow-up as needed.

## 2023-11-03 NOTE — Addendum Note (Signed)
 Addended by: Lott Rouleau A on: 11/03/2023 03:42 PM   Modules accepted: Orders

## 2023-11-03 NOTE — ED Provider Notes (Signed)
 [facility]    CSN: [id number] Arrival date & time: 11/03/23  1607      History   Chief Complaint Chief Complaint  Patient presents with   Anxiety    Exposure to Covid - Entered by patient    HPI Jessica Woodward is a 83 y.o. female.   The history is provided by the patient.   Patient presents for COVID testing after COVID exposure 2 days ago.  Patient denies symptoms to include fever, chills, nasal congestion, runny nose, headache, ear pain, cough, abdominal pain, nausea, vomiting, diarrhea, or rash.  Reports she has received 5 COVID vaccines.  Past Medical History:  Diagnosis Date   Allergy    Arthritis    Breast cancer (HCC)    Breast disorder    cancer   CARCINOMA, SQUAMOUS CELL, HX OF 06/05/2007   Cataract    Chronic kidney disease    DUCTAL CARCINOMA IN SITU, LEFT BREAST 2002   s/p left lumpectomy, xrt and tamoxifen   History of chemotherapy    Hyperlipemia    Hypertension    Personal history of chemotherapy 2002   Personal history of radiation therapy 2002   RADIATION THERAPY, HX OF    Seasonal allergies    Skin cancer     Patient Active Problem List   Diagnosis Date Noted   Foreign body of left ear 08/09/2023   Flu vaccine need 02/10/2023   Estrogen deficiency 02/10/2023   Anemia due to zinc  deficiency 03/07/2022   Iron deficiency anemia secondary to inadequate dietary iron intake 03/05/2022   Vitamin B12 deficiency anemia due to intrinsic factor deficiency 03/04/2022   Abnormal electrocardiogram (ECG) (EKG) 02/22/2022   Lobular carcinoma in situ (LCIS) of breast 01/21/2021   Atherosclerosis of aorta (HCC) 01/21/2021   DDD (degenerative disc disease), cervical 05/10/2019   Seasonal allergic rhinitis due to pollen 02/15/2017   Sensorineural hearing loss, bilateral 12/27/2016   Essential hypertension, benign 09/11/2013   Dyslipidemia, goal LDL below 130 01/17/2012   DUCTAL CARCINOMA IN SITU, LEFT BREAST 06/05/2007    Past Surgical  History:  Procedure Laterality Date   ABDOMINAL HYSTERECTOMY     athroscopy right knee  2013   torn miniscus   BREAST BIOPSY Left 2002   BREAST EXCISIONAL BIOPSY Right    BREAST LUMPECTOMY Left 2002   had chemo and radiation   BREAST SURGERY     Left breast lumpectomy with axillary sential node   DILATION AND CURETTAGE OF UTERUS     Left knee surgery     Dr Maralee Senate   MOHS SURGERY     (R) arm   PACEMAKER IMPLANT N/A 12/11/2019   Procedure: PACEMAKER IMPLANT;  Surgeon: Tammie Fall, MD;  Location: [facility];  Service: Cardiovascular;  Laterality: N/A;   right breast lupectomy     TONSILLECTOMY AND ADENOIDECTOMY     TUBAL LIGATION      OB History     Gravida  3   Para  2   Term      Preterm      AB  1   Living  2      SAB  1   IAB      Ectopic      Multiple      Live Births  2            Home Medications    Prior to Admission medications   Medication Sig Start Date End Date  Taking? Authorizing Provider  acetaminophen  (TYLENOL ) 325 MG tablet Take 1-2 tablets (325-650 mg total) by mouth every 4 (four) hours as needed for mild pain. 12/12/19   Tylene Galla, PA-C  atorvastatin  (LIPITOR) 40 MG tablet TAKE 1 TABLET BY MOUTH DAILY 08/15/23   Arcadio Knuckles, MD  irbesartan  (AVAPRO ) 300 MG tablet TAKE 1 TABLET BY MOUTH DAILY 10/04/23   Arcadio Knuckles, MD  loratadine (CLARITIN) 10 MG tablet Take 10 mg by mouth daily.    [provider]  Multiple Vitamin (MULTIVITAMIN) tablet Take 1 tablet by mouth daily.    [provider]  triamcinolone  cream (KENALOG ) 0.1 % Apply 1 application topically 4 (four) times daily as needed (rash).  09/09/16   [provider]    Family History Family History  Problem Relation Age of Onset   Coronary artery disease Mother    Dementia Mother        Died at age 68   Thyroid  disease Mother        hypothyroid   Macular degeneration Father    Coronary artery disease Father     Diabetes Father    Heart disease Father    Cancer Brother        pancreatic   Heart disease Maternal Grandmother    Kidney disease Maternal Grandmother    Heart disease Maternal Grandfather    Heart disease Paternal Grandmother    Diabetes Paternal Grandmother    Heart disease Paternal Grandfather    Hypertension Son    Heart attack Son    Hypertension Son     Social History Social History   Tobacco Use   Smoking status: Former    Current packs/day: 0.00    Average packs/day: 0.3 packs/day for 2.0 years (0.5 ttl pk-yrs)    Types: Cigarettes    Quit date: 12/28/1963    Years since quitting: 59.8   Smokeless tobacco: Never  Vaping Use   Vaping status: Never Used  Substance Use Topics   Alcohol use: Yes    Comment: rare glass of wine   Drug use: No     Allergies   Anastrozole, Lisinopril, Sulfasalazine, Sulfonamide derivatives, Lactose intolerance (gi), and Evenity  [romosozumab ]   Review of Systems Review of Systems Per HPI  Physical Exam Triage Vital Signs ED Triage Vitals  Encounter Vitals Group     BP 11/03/23 0926 115/71     Girls Systolic BP Percentile --      Girls Diastolic BP Percentile --      Boys Systolic BP Percentile --      Boys Diastolic BP Percentile --      Pulse Rate 11/03/23 0926 76     Resp 11/03/23 0926 18     Temp 11/03/23 0926 98.2 F (36.8 C)     Temp Source 11/03/23 0926 Oral     SpO2 11/03/23 0926 97 %     Weight --      Height --      Head Circumference --      Peak Flow --      Pain Score 11/03/23 0927 0     Pain Loc --      Pain Education --      Exclude from Growth Chart --    No data found.  Updated Vital Signs BP 115/71 (BP Location: Right Arm)   Pulse 76   Temp 98.2 F (36.8 C) (Oral)   Resp 18   SpO2 97%   Visual Acuity  Right Eye Distance:   Left Eye Distance:   Bilateral Distance:    Right Eye Near:   Left Eye Near:    Bilateral Near:     Physical Exam Vitals and nursing note reviewed.   Constitutional:      General: She is not in acute distress.    Appearance: Normal appearance.  HENT:     Head: Normocephalic.     Right Ear: Tympanic membrane, ear canal and external ear normal.     Left Ear: Tympanic membrane, ear canal and external ear normal.     Nose: Nose normal.     Mouth/Throat:     Mouth: Mucous membranes are moist.   Eyes:     Extraocular Movements: Extraocular movements intact.     Conjunctiva/sclera: Conjunctivae normal.     Pupils: Pupils are equal, round, and reactive to light.    Cardiovascular:     Rate and Rhythm: Normal rate and regular rhythm.     Pulses: Normal pulses.     Heart sounds: Normal heart sounds.  Pulmonary:     Effort: Pulmonary effort is normal. No respiratory distress.     Breath sounds: Normal breath sounds. No stridor. No wheezing, rhonchi or rales.  Abdominal:     Palpations: Abdomen is soft.     Tenderness: There is no abdominal tenderness.   Musculoskeletal:     Cervical back: Normal range of motion.  Lymphadenopathy:     Cervical: No cervical adenopathy.   Skin:    General: Skin is warm and dry.   Neurological:     General: No focal deficit present.     Mental Status: She is alert and oriented to person, place, and time.   Psychiatric:        Mood and Affect: Mood normal.        Behavior: Behavior normal.      UC Treatments / Results  Labs (all labs ordered are listed, but only abnormal results are displayed) Labs Reviewed  POC SARS CORONAVIRUS 2 AG -  ED    EKG   Radiology No results found.  Procedures Procedures (including critical care time)  Medications Ordered in UC Medications - No data to display  Initial Impression / Assessment and Plan / UC Course  I have reviewed the triage vital signs and the nursing notes.  Pertinent labs & imaging results that were available during my care of the patient were reviewed by me and considered in my medical decision making (see chart for  details).  The COVID test was negative.  Supportive care recommendations were provided and discussed with the patient to include continuing monitoring for symptoms, and recommend follow-up testing if symptoms develop.  Discussed with patient that antiviral treatment must be started within 5 days of symptoms starting.  Patient was in agreement with this plan of care and verbalizes understanding.  All questions were answered.  Patient stable for discharge.  Final Clinical Impressions(s) / UC Diagnoses   Final diagnoses:  None   Discharge Instructions   None    ED Prescriptions   None    PDMP not reviewed this encounter.   Hardy Lia, NP 11/03/23 0945

## 2023-11-03 NOTE — Progress Notes (Signed)
 Remote pacemaker transmission.

## 2023-11-29 DIAGNOSIS — M170 Bilateral primary osteoarthritis of knee: Secondary | ICD-10-CM | POA: Diagnosis not present

## 2023-12-08 ENCOUNTER — Ambulatory Visit: Payer: Medicare Other

## 2023-12-21 ENCOUNTER — Ambulatory Visit

## 2023-12-21 DIAGNOSIS — I442 Atrioventricular block, complete: Secondary | ICD-10-CM

## 2023-12-21 LAB — CUP PACEART REMOTE DEVICE CHECK
Battery Remaining Longevity: 96 mo
Battery Voltage: 2.99 V
Brady Statistic AP VP Percent: 12.25 %
Brady Statistic AP VS Percent: 0 %
Brady Statistic AS VP Percent: 87.75 %
Brady Statistic AS VS Percent: 0.01 %
Brady Statistic RA Percent Paced: 12.21 %
Brady Statistic RV Percent Paced: 99.99 %
Date Time Interrogation Session: 20250730042146
Implantable Lead Connection Status: 753985
Implantable Lead Connection Status: 753985
Implantable Lead Implant Date: 20210720
Implantable Lead Implant Date: 20210720
Implantable Lead Location: 753859
Implantable Lead Location: 753860
Implantable Lead Model: 3830
Implantable Lead Model: 5076
Implantable Pulse Generator Implant Date: 20210720
Lead Channel Impedance Value: 304 Ohm
Lead Channel Impedance Value: 361 Ohm
Lead Channel Impedance Value: 380 Ohm
Lead Channel Impedance Value: 570 Ohm
Lead Channel Pacing Threshold Amplitude: 0.5 V
Lead Channel Pacing Threshold Amplitude: 1 V
Lead Channel Pacing Threshold Pulse Width: 0.4 ms
Lead Channel Pacing Threshold Pulse Width: 0.4 ms
Lead Channel Sensing Intrinsic Amplitude: 2.75 mV
Lead Channel Sensing Intrinsic Amplitude: 2.75 mV
Lead Channel Sensing Intrinsic Amplitude: 9 mV
Lead Channel Sensing Intrinsic Amplitude: 9 mV
Lead Channel Setting Pacing Amplitude: 1.5 V
Lead Channel Setting Pacing Amplitude: 2.5 V
Lead Channel Setting Pacing Pulse Width: 0.4 ms
Lead Channel Setting Sensing Sensitivity: 2 mV
Zone Setting Status: 755011
Zone Setting Status: 755011

## 2023-12-22 IMAGING — DX DG CHEST 2V
2 series · 2 of 2 positions shown · non-contrast
Comparison: 05/05/2021

CLINICAL DATA: Chest pain, shortness of breath

EXAM:
CHEST - 2 VIEW

[chest lat]
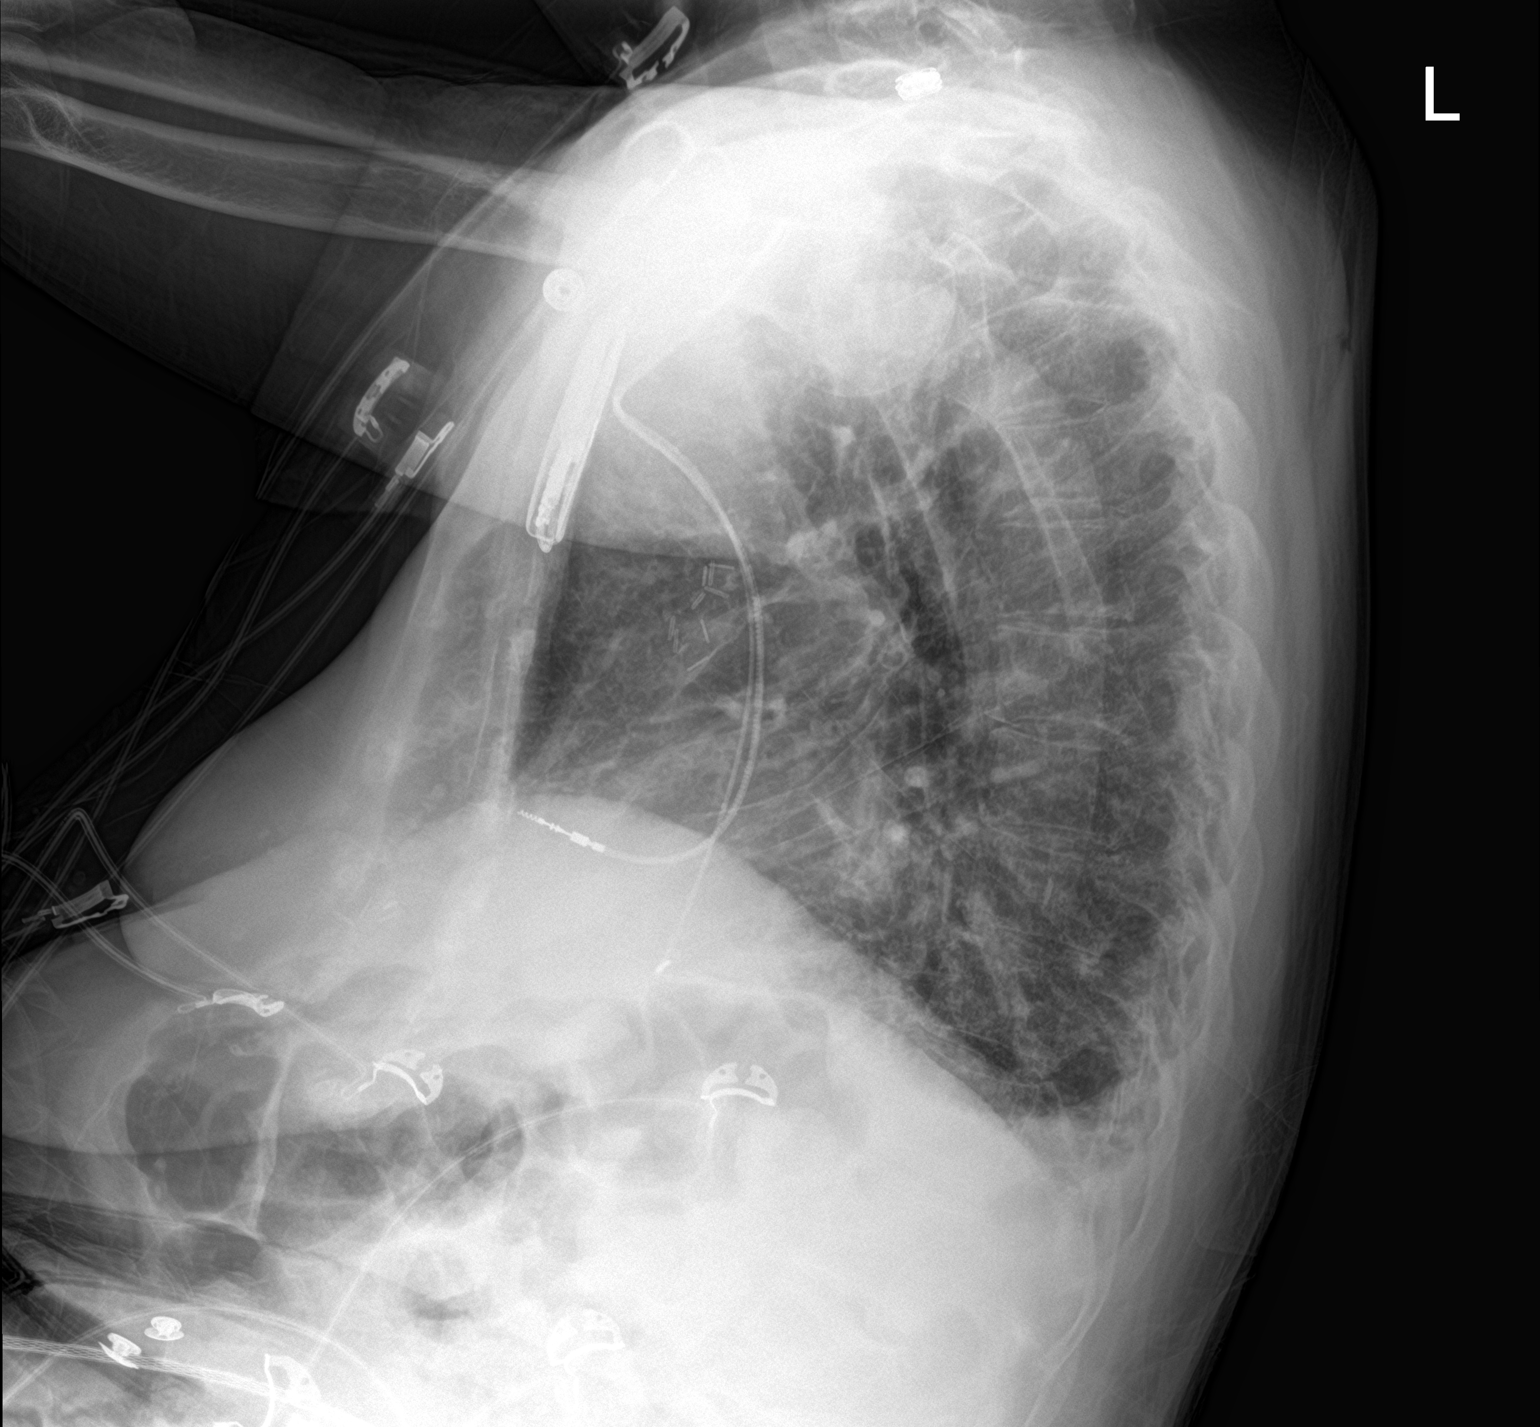

[chest ap]
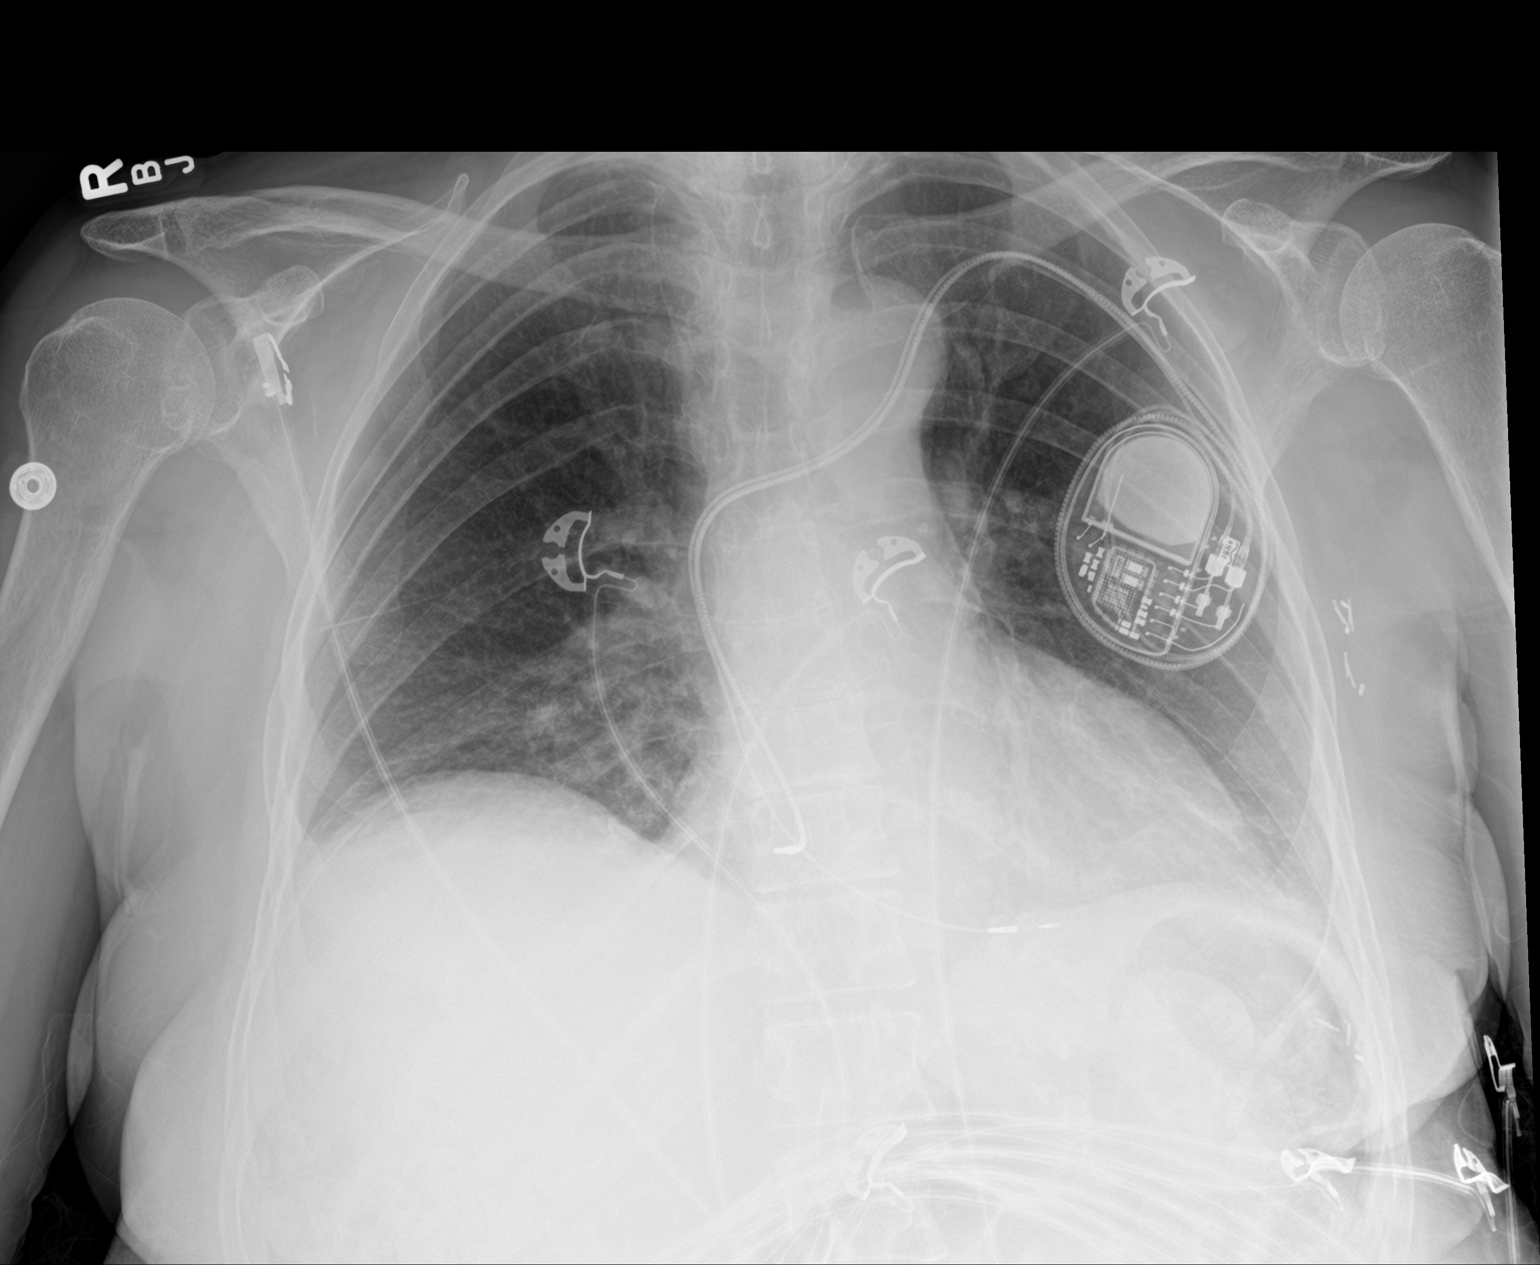

[2 of 2 positions shown; findings below may reference images not displayed]

FINDINGS: Mild patchy bilateral lower lobe opacities, favoring mild pneumonia,
less likely atelectasis. No pleural effusion or pneumothorax.

The heart is normal in size.  Left subclavian pacemaker.

Visualized osseous structures are within normal limits.
IMPRESSION: Mild patchy bilateral lower lobe opacities, favoring mild pneumonia,
less likely atelectasis.

## 2023-12-23 ENCOUNTER — Ambulatory Visit: Payer: Self-pay | Admitting: Internal Medicine

## 2023-12-23 IMAGING — DX DG CHEST 2V
2 series · 2 of 2 positions shown · non-contrast
Comparison: 10/08/2021

CLINICAL DATA: History of abnormal chest x-ray, bilateral pneumonia

EXAM:
CHEST - 2 VIEW

[chest pa]
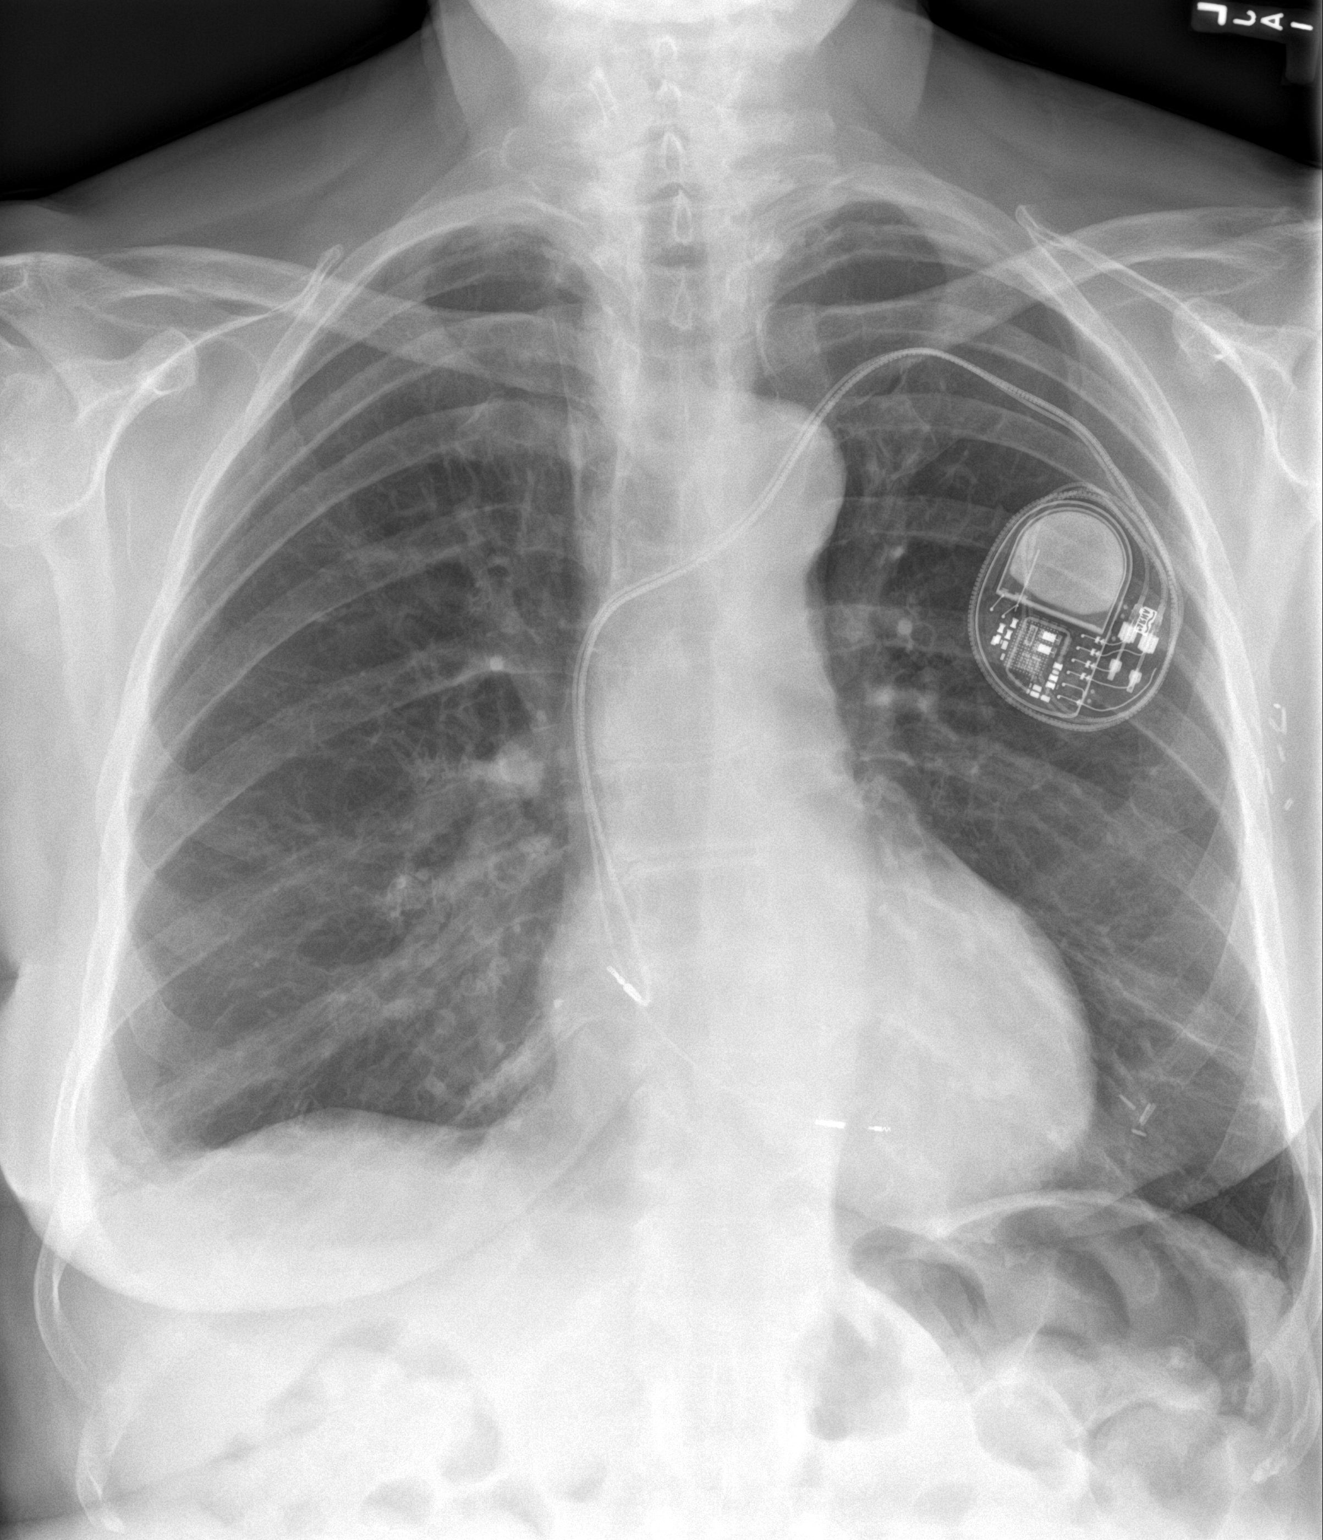

[chest lat]
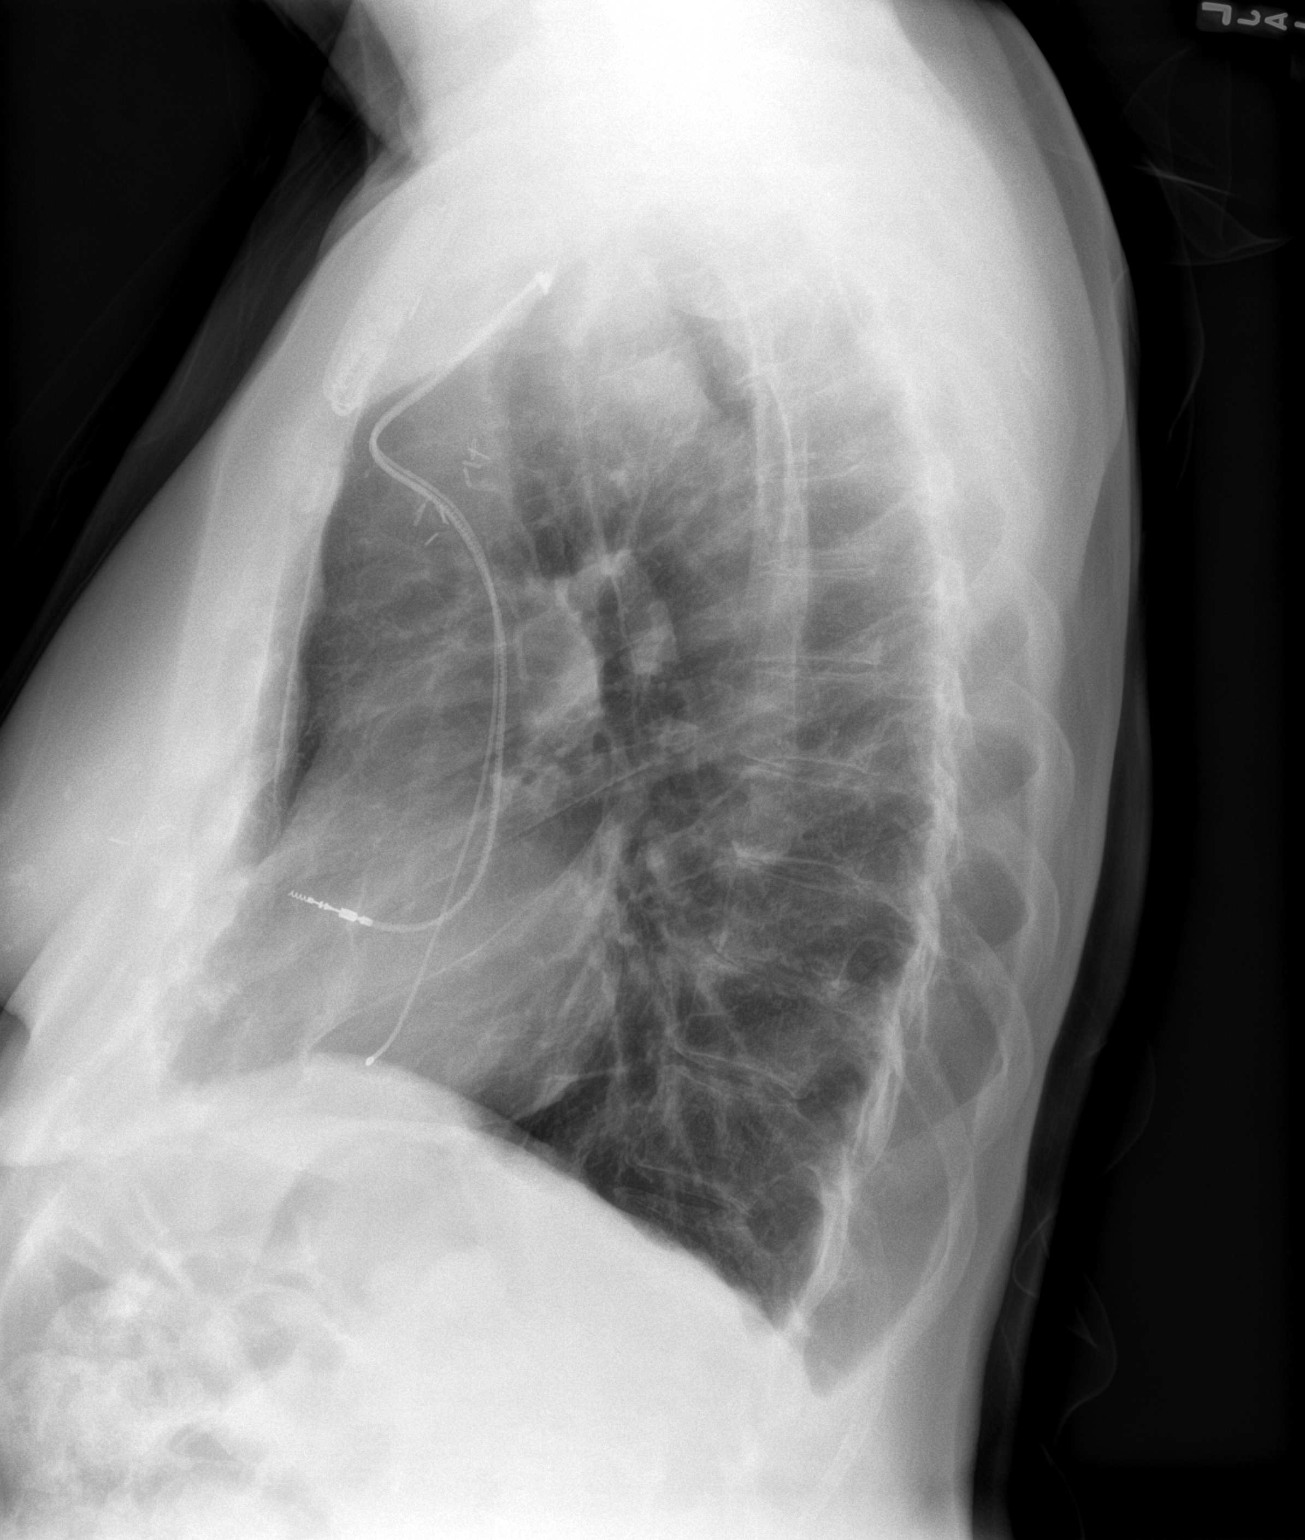

[2 of 2 positions shown; findings below may reference images not displayed]

FINDINGS: Frontal and lateral views of the chest demonstrate stable dual lead
pacer. The cardiac silhouette is unchanged. There has been interval
resolution of the bibasilar airspace disease seen previously. Small
residual right pleural effusion versus pleural thickening. No
pneumothorax. No acute bony abnormalities.
IMPRESSION: 1. Trace residual right pleural effusion versus pleural thickening.
2. Interval resolution of the bibasilar airspace disease seen
previously.

## 2023-12-26 ENCOUNTER — Encounter: Payer: Self-pay | Admitting: Internal Medicine

## 2024-01-09 ENCOUNTER — Telehealth: Payer: Self-pay

## 2024-01-09 NOTE — Telephone Encounter (Signed)
 SABRA

## 2024-01-09 NOTE — Telephone Encounter (Signed)
 Pt ready for scheduling for PROLIA  on or after : 02/08/24  Option# 1: Buy/Bill (Office supplied medication)  Out-of-pocket cost due at time of clinic visit: $0  Number of injection/visits approved: ---  Primary: MEDICARE Prolia  co-insurance: 0% Admin fee co-insurance: 0%  Secondary: AARP-MEDSUP Prolia  co-insurance:  Admin fee co-insurance:   Medical Benefit Details: Date Benefits were checked: 01/09/24 Deductible: $257 Met of $257 Required/ Coinsurance: 0%/ Admin Fee: 0%  Prior Auth: N/A PA# Expiration Date:   # of doses approved: ----------------------------------------------------------------------- Option# 2- Med Obtained from pharmacy:  Pharmacy benefit: Copay $--- (Paid to pharmacy) Admin Fee: --- (Pay at clinic)  Prior Auth: --- PA# Expiration Date:   # of doses approved:   If patient wants fill through the pharmacy benefit please send prescription to: ---, and include estimated need by date in rx notes. Pharmacy will ship medication directly to the office.  Patient NOT eligible for Prolia  Copay Card. Copay Card can make patient's cost as little as $25. Link to apply: [email or web address]://[email or web address]  ** This summary of benefits is an estimation of the patient's out-of-pocket cost. Exact cost may very based on individual plan coverage.

## 2024-01-09 NOTE — Telephone Encounter (Signed)
 Prolia  VOB initiated via [email or web address]  Next Prolia  inj DUE: 02/08/24

## 2024-01-12 ENCOUNTER — Encounter (HOSPITAL_COMMUNITY): Payer: Self-pay

## 2024-01-12 ENCOUNTER — Emergency Department (HOSPITAL_COMMUNITY)
Admission: EM | Admit: 2024-01-12 | Discharge: 2024-01-12 | Disposition: A | Discharge status: Home / Self Care | Source: Home / Self Care | Attending: Emergency Medicine | Admitting: Emergency Medicine

## 2024-01-12 ENCOUNTER — Other Ambulatory Visit: Payer: Self-pay

## 2024-01-12 DIAGNOSIS — Z1152 Encounter for screening for COVID-19: Secondary | ICD-10-CM | POA: Diagnosis not present

## 2024-01-12 DIAGNOSIS — K0889 Other specified disorders of teeth and supporting structures: Secondary | ICD-10-CM | POA: Diagnosis not present

## 2024-01-12 DIAGNOSIS — Z923 Personal history of irradiation: Secondary | ICD-10-CM | POA: Diagnosis not present

## 2024-01-12 DIAGNOSIS — Z85828 Personal history of other malignant neoplasm of skin: Secondary | ICD-10-CM | POA: Diagnosis not present

## 2024-01-12 DIAGNOSIS — Z833 Family history of diabetes mellitus: Secondary | ICD-10-CM | POA: Diagnosis not present

## 2024-01-12 DIAGNOSIS — K122 Cellulitis and abscess of mouth: Secondary | ICD-10-CM | POA: Diagnosis not present

## 2024-01-12 DIAGNOSIS — E785 Hyperlipidemia, unspecified: Secondary | ICD-10-CM | POA: Diagnosis present

## 2024-01-12 DIAGNOSIS — G459 Transient cerebral ischemic attack, unspecified: Secondary | ICD-10-CM | POA: Diagnosis not present

## 2024-01-12 DIAGNOSIS — M272 Inflammatory conditions of jaws: Secondary | ICD-10-CM | POA: Diagnosis not present

## 2024-01-12 DIAGNOSIS — Z0389 Encounter for observation for other suspected diseases and conditions ruled out: Secondary | ICD-10-CM | POA: Diagnosis not present

## 2024-01-12 DIAGNOSIS — R531 Weakness: Secondary | ICD-10-CM | POA: Diagnosis not present

## 2024-01-12 DIAGNOSIS — Z950 Presence of cardiac pacemaker: Secondary | ICD-10-CM | POA: Diagnosis not present

## 2024-01-12 DIAGNOSIS — R42 Dizziness and giddiness: Secondary | ICD-10-CM | POA: Diagnosis not present

## 2024-01-12 DIAGNOSIS — Z90710 Acquired absence of both cervix and uterus: Secondary | ICD-10-CM | POA: Diagnosis not present

## 2024-01-12 DIAGNOSIS — R221 Localized swelling, mass and lump, neck: Secondary | ICD-10-CM | POA: Diagnosis not present

## 2024-01-12 DIAGNOSIS — K047 Periapical abscess without sinus: Secondary | ICD-10-CM | POA: Diagnosis present

## 2024-01-12 DIAGNOSIS — Z853 Personal history of malignant neoplasm of breast: Secondary | ICD-10-CM | POA: Insufficient documentation

## 2024-01-12 DIAGNOSIS — G901 Familial dysautonomia [Riley-Day]: Secondary | ICD-10-CM | POA: Diagnosis present

## 2024-01-12 DIAGNOSIS — Z8673 Personal history of transient ischemic attack (TIA), and cerebral infarction without residual deficits: Secondary | ICD-10-CM | POA: Diagnosis not present

## 2024-01-12 DIAGNOSIS — I129 Hypertensive chronic kidney disease with stage 1 through stage 4 chronic kidney disease, or unspecified chronic kidney disease: Secondary | ICD-10-CM | POA: Insufficient documentation

## 2024-01-12 DIAGNOSIS — N189 Chronic kidney disease, unspecified: Secondary | ICD-10-CM | POA: Insufficient documentation

## 2024-01-12 DIAGNOSIS — R4781 Slurred speech: Secondary | ICD-10-CM | POA: Diagnosis present

## 2024-01-12 DIAGNOSIS — Z79899 Other long term (current) drug therapy: Secondary | ICD-10-CM | POA: Diagnosis not present

## 2024-01-12 DIAGNOSIS — L03211 Cellulitis of face: Secondary | ICD-10-CM | POA: Diagnosis not present

## 2024-01-12 DIAGNOSIS — I442 Atrioventricular block, complete: Secondary | ICD-10-CM | POA: Diagnosis present

## 2024-01-12 DIAGNOSIS — R609 Edema, unspecified: Secondary | ICD-10-CM | POA: Diagnosis not present

## 2024-01-12 DIAGNOSIS — R55 Syncope and collapse: Secondary | ICD-10-CM | POA: Diagnosis not present

## 2024-01-12 DIAGNOSIS — Z87891 Personal history of nicotine dependence: Secondary | ICD-10-CM | POA: Diagnosis not present

## 2024-01-12 DIAGNOSIS — R29810 Facial weakness: Secondary | ICD-10-CM | POA: Diagnosis present

## 2024-01-12 DIAGNOSIS — E739 Lactose intolerance, unspecified: Secondary | ICD-10-CM | POA: Diagnosis present

## 2024-01-12 DIAGNOSIS — G8194 Hemiplegia, unspecified affecting left nondominant side: Secondary | ICD-10-CM | POA: Diagnosis present

## 2024-01-12 DIAGNOSIS — I10 Essential (primary) hypertension: Secondary | ICD-10-CM | POA: Diagnosis not present

## 2024-01-12 DIAGNOSIS — Z9221 Personal history of antineoplastic chemotherapy: Secondary | ICD-10-CM | POA: Diagnosis not present

## 2024-01-12 DIAGNOSIS — I6782 Cerebral ischemia: Secondary | ICD-10-CM | POA: Diagnosis not present

## 2024-01-12 DIAGNOSIS — R29818 Other symptoms and signs involving the nervous system: Secondary | ICD-10-CM | POA: Diagnosis not present

## 2024-01-12 DIAGNOSIS — Z8249 Family history of ischemic heart disease and other diseases of the circulatory system: Secondary | ICD-10-CM | POA: Diagnosis not present

## 2024-01-12 DIAGNOSIS — Z86000 Personal history of in-situ neoplasm of breast: Secondary | ICD-10-CM | POA: Diagnosis not present

## 2024-01-12 MED ORDER — OXYCODONE HCL 5 MG PO TABS: 5.0000 mg | ORAL_TABLET | ORAL | 0 refills | Status: DC | PRN

## 2024-01-12 MED ORDER — OXYCODONE HCL 5 MG PO TABS
5.0000 mg | ORAL_TABLET | Freq: Once | ORAL | Status: AC
  Administered 2024-01-12: 5 mg via ORAL
  Filled 2024-01-12: qty 1

## 2024-01-12 NOTE — ED Provider Notes (Signed)
 [facility]-EMERGENCY DEPT [facility] Emergency Department Provider Note MRN:  [id number]  Arrival date & time: 01/12/24     Chief Complaint   Dental Pain   History of Present Illness   Jessica Woodward is a 83 y.o. year-old female with history of hypertension, CKD presenting to the ED with chief complaint of dental pain.  Pain to the very back lower left tooth for the past few days.  Dentist is going to remove the tooth on Monday.  Patient here for help with pain control, Tylenol  and tramadol  not helping at home.  Told that she should not take Motrin.  Denies fever, no other complaints.  Review of Systems  A thorough review of systems was obtained and all systems are negative except as noted in the HPI and PMH.   Patient's Health History    Past Medical History:  Diagnosis Date   Allergy    Arthritis    Breast cancer (HCC)    Breast disorder    cancer   CARCINOMA, SQUAMOUS CELL, HX OF 06/05/2007   Cataract    Chronic kidney disease    DUCTAL CARCINOMA IN SITU, LEFT BREAST 2002   s/p left lumpectomy, xrt and tamoxifen   History of chemotherapy    Hyperlipemia    Hypertension    Personal history of chemotherapy 2002   Personal history of radiation therapy 2002   RADIATION THERAPY, HX OF    Seasonal allergies    Skin cancer     Past Surgical History:  Procedure Laterality Date   ABDOMINAL HYSTERECTOMY     athroscopy right knee  2013   torn miniscus   BREAST BIOPSY Left 2002   BREAST EXCISIONAL BIOPSY Right    BREAST LUMPECTOMY Left 2002   had chemo and radiation   BREAST SURGERY     Left breast lumpectomy with axillary sential node   DILATION AND CURETTAGE OF UTERUS     Left knee surgery     Dr Prentice Collet   MOHS SURGERY     (R) arm   PACEMAKER IMPLANT N/A 12/11/2019   Procedure: PACEMAKER IMPLANT;  Surgeon: Waddell Danelle ORN, MD;  Location: [facility] INVASIVE CV LAB;  Service: Cardiovascular;  Laterality: N/A;   right breast lupectomy     TONSILLECTOMY AND  ADENOIDECTOMY     TUBAL LIGATION      Family History  Problem Relation Age of Onset   Coronary artery disease Mother    Dementia Mother        Died at age 63   Thyroid  disease Mother        hypothyroid   Macular degeneration Father    Coronary artery disease Father    Diabetes Father    Heart disease Father    Cancer Brother        pancreatic   Heart disease Maternal Grandmother    Kidney disease Maternal Grandmother    Heart disease Maternal Grandfather    Heart disease Paternal Grandmother    Diabetes Paternal Grandmother    Heart disease Paternal Grandfather    Hypertension Son    Heart attack Son    Hypertension Son     Social History   Socioeconomic History   Marital status: Widowed    Spouse name: Not on file   Number of children: 2   Years of education: Not on file   Highest education level: Associate degree: occupational, Scientist, product/process development, or vocational program  Occupational History   Occupation: retired  Tobacco Use  Smoking status: Former    Current packs/day: 0.00    Average packs/day: 0.3 packs/day for 2.0 years (0.5 ttl pk-yrs)    Types: Cigarettes    Quit date: 12/28/1963    Years since quitting: 60.0   Smokeless tobacco: Never  Vaping Use   Vaping status: Never Used  Substance and Sexual Activity   Alcohol use: Yes    Comment: rare glass of wine   Drug use: No   Sexual activity: Not Currently    Partners: Male    Birth control/protection: Surgical    Comment: hyst  Other Topics Concern   Not on file  Social History Narrative   HSG, [employer]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 married '60. 2 boys - '62, '73; 11 grandchildren; 3 great-grandchildren. Retired.   Social Drivers of Corporate investment banker Strain: Low Risk  (07/05/2023)   Overall Financial Resource Strain (CARDIA)    Difficulty of Paying Living Expenses: Not hard at all  Food Insecurity: No Food Insecurity (07/05/2023)   Hunger Vital Sign    Worried About Running Out of Food in  the Last Year: Never true    Ran Out of Food in the Last Year: Never true  Transportation Needs: No Transportation Needs (07/05/2023)   PRAPARE - Administrator, Civil Service (Medical): No    Lack of Transportation (Non-Medical): No  Physical Activity: Sufficiently Active (07/05/2023)   Exercise Vital Sign    Days of Exercise per Week: 5 days    Minutes of Exercise per Session: 60 min  Stress: No Stress Concern Present (07/05/2023)   [employer] of Occupational Health - Occupational Stress Questionnaire    Feeling of Stress : Not at all  Social Connections: Moderately Integrated (07/05/2023)   Social Connection and Isolation Panel    Frequency of Communication with Friends and Family: More than three times a week    Frequency of Social Gatherings with Friends and Family: More than three times a week    Attends Religious Services: More than 4 times per year    Active Member of [employer] or Organizations: Yes    Attends Banker Meetings: More than 4 times per year    Marital Status: Widowed  Intimate Partner Violence: Not At Risk (07/05/2023)   Humiliation, Afraid, Rape, and Kick questionnaire    Fear of Current or Ex-Partner: No    Emotionally Abused: No    Physically Abused: No    Sexually Abused: No     Physical Exam   Vitals:   01/12/24 2146  BP: 128/85  Pulse: 75  Resp: 16  Temp: 97.6 F (36.4 C)  SpO2: 98%    CONSTITUTIONAL: Well-appearing, NAD NEURO/PSYCH:  Alert and oriented x 3, no focal deficits EYES:  eyes equal and reactive ENT/NECK:  no LAD, no JVD CARDIO: Regular rate, well-perfused, normal S1 and S2 PULM:  CTAB no wheezing or rhonchi GI/GU:  non-distended, non-tender MSK/SPINE:  No gross deformities, no edema SKIN:  no rash, atraumatic   *Additional and/or pertinent findings included in MDM below  Diagnostic and Interventional Summary    EKG Interpretation Date/Time:    Ventricular Rate:    PR Interval:    QRS Duration:     QT Interval:    QTC Calculation:   R Axis:      Text Interpretation:         Labs Reviewed - No data to display  No orders to display    Medications  oxyCODONE  (Oxy IR/ROXICODONE ) immediate release tablet 5 mg (5 mg Oral Given 01/12/24 2334)     Procedures  /  Critical Care Procedures  ED Course and Medical Decision Making  Initial Impression and Ddx Tender tooth but no signs of abscess, no systemic symptoms, nothing to suggest more significant contiguous infection.  Visit for improved pain control, dental block offered but declined.  Past medical/surgical history that increases complexity of ED encounter: CKD  Interpretation of Diagnostics Laboratory and/or imaging options to aid in the diagnosis/care of the patient were considered.  After careful history and physical examination, it was determined that there was no indication for diagnostics at this time.  Patient Reassessment and Ultimate Disposition/Management     Discharge  Patient management required discussion with the following services or consulting groups:  None  Complexity of Problems Addressed Acute complicated illness or Injury  Additional Data Reviewed and Analyzed Further history obtained from: None  Additional Factors Impacting ED Encounter Risk Prescriptions  Ozell HERO. Theadore, MD [facility] Emergency Medicine [facility] [email or web address]  Final Clinical Impressions(s) / ED Diagnoses     ICD-10-CM   1. Pain, dental  K08.89       ED Discharge Orders          Ordered    oxyCODONE  (ROXICODONE ) 5 MG immediate release tablet  Every 4 hours PRN        01/12/24 2333             Discharge Instructions Discussed with and Provided to Patient:    Discharge Instructions      You were evaluated in the Emergency Department and after careful evaluation, we did not find any emergent condition requiring admission or further testing in the hospital.  Your exam/testing today  is overall reassuring.  Symptoms likely due to a tooth infection.  Continue the antibiotics prescribed by your dentist.  Take Tylenol  1000 mg every 4-6 hours for pain.  Use the oxycodone  every 4 hours as needed for breakthrough pain.  Keep your follow-up with your dentist.  Please return to the Emergency Department if you experience any worsening of your condition.   Thank you for allowing us  to be a part of your care.      Theadore Ozell HERO, MD 01/12/24 [phone]

## 2024-01-12 NOTE — ED Triage Notes (Signed)
 Pt reports she saw her dentist yesterday and she has a tooth abscessed on her lower left jaw.  Pt reports she is taking tylenol , tramadol  and amoxicillin but the pain is still excruciating.

## 2024-01-12 NOTE — Discharge Instructions (Addendum)
 You were evaluated in the Emergency Department and after careful evaluation, we did not find any emergent condition requiring admission or further testing in the hospital.  Your exam/testing today is overall reassuring.  Symptoms likely due to a tooth infection.  Continue the antibiotics prescribed by your dentist.  Take Tylenol  1000 mg every 4-6 hours for pain.  Use the oxycodone  every 4 hours as needed for breakthrough pain.  Keep your follow-up with your dentist.  Please return to the Emergency Department if you experience any worsening of your condition.   Thank you for allowing us  to be a part of your care.

## 2024-01-13 ENCOUNTER — Encounter (HOSPITAL_COMMUNITY): Payer: Self-pay

## 2024-01-13 ENCOUNTER — Emergency Department (HOSPITAL_COMMUNITY)

## 2024-01-13 ENCOUNTER — Inpatient Hospital Stay (HOSPITAL_COMMUNITY)
Admission: EM | Admit: 2024-01-13 | Discharge: 2024-01-15 | DRG: 069 | Disposition: A | Discharge status: Home / Self Care | Attending: Internal Medicine | Admitting: Internal Medicine

## 2024-01-13 ENCOUNTER — Ambulatory Visit: Payer: Self-pay

## 2024-01-13 ENCOUNTER — Ambulatory Visit: Admission: EM | Admit: 2024-01-13 | Discharge: 2024-01-13 | Disposition: A | Discharge status: Home / Self Care

## 2024-01-13 DIAGNOSIS — Z882 Allergy status to sulfonamides status: Secondary | ICD-10-CM

## 2024-01-13 DIAGNOSIS — R4781 Slurred speech: Secondary | ICD-10-CM | POA: Diagnosis not present

## 2024-01-13 DIAGNOSIS — Z87891 Personal history of nicotine dependence: Secondary | ICD-10-CM

## 2024-01-13 DIAGNOSIS — Z85828 Personal history of other malignant neoplasm of skin: Secondary | ICD-10-CM

## 2024-01-13 DIAGNOSIS — Z950 Presence of cardiac pacemaker: Secondary | ICD-10-CM

## 2024-01-13 DIAGNOSIS — I10 Essential (primary) hypertension: Secondary | ICD-10-CM | POA: Diagnosis present

## 2024-01-13 DIAGNOSIS — Z853 Personal history of malignant neoplasm of breast: Secondary | ICD-10-CM

## 2024-01-13 DIAGNOSIS — Z90710 Acquired absence of both cervix and uterus: Secondary | ICD-10-CM

## 2024-01-13 DIAGNOSIS — Z833 Family history of diabetes mellitus: Secondary | ICD-10-CM

## 2024-01-13 DIAGNOSIS — Z1152 Encounter for screening for COVID-19: Secondary | ICD-10-CM

## 2024-01-13 DIAGNOSIS — Z8249 Family history of ischemic heart disease and other diseases of the circulatory system: Secondary | ICD-10-CM

## 2024-01-13 DIAGNOSIS — Z9221 Personal history of antineoplastic chemotherapy: Secondary | ICD-10-CM

## 2024-01-13 DIAGNOSIS — R55 Syncope and collapse: Secondary | ICD-10-CM

## 2024-01-13 DIAGNOSIS — G901 Familial dysautonomia [Riley-Day]: Secondary | ICD-10-CM | POA: Diagnosis present

## 2024-01-13 DIAGNOSIS — R29810 Facial weakness: Secondary | ICD-10-CM | POA: Diagnosis not present

## 2024-01-13 DIAGNOSIS — G459 Transient cerebral ischemic attack, unspecified: Secondary | ICD-10-CM | POA: Diagnosis not present

## 2024-01-13 DIAGNOSIS — R29818 Other symptoms and signs involving the nervous system: Secondary | ICD-10-CM | POA: Diagnosis not present

## 2024-01-13 DIAGNOSIS — I6782 Cerebral ischemia: Secondary | ICD-10-CM | POA: Diagnosis not present

## 2024-01-13 DIAGNOSIS — Z79899 Other long term (current) drug therapy: Secondary | ICD-10-CM

## 2024-01-13 DIAGNOSIS — E739 Lactose intolerance, unspecified: Secondary | ICD-10-CM | POA: Diagnosis present

## 2024-01-13 DIAGNOSIS — I442 Atrioventricular block, complete: Secondary | ICD-10-CM | POA: Diagnosis present

## 2024-01-13 DIAGNOSIS — Z888 Allergy status to other drugs, medicaments and biological substances status: Secondary | ICD-10-CM

## 2024-01-13 DIAGNOSIS — R531 Weakness: Secondary | ICD-10-CM | POA: Diagnosis not present

## 2024-01-13 DIAGNOSIS — K047 Periapical abscess without sinus: Secondary | ICD-10-CM

## 2024-01-13 DIAGNOSIS — I959 Hypotension, unspecified: Secondary | ICD-10-CM

## 2024-01-13 DIAGNOSIS — R42 Dizziness and giddiness: Secondary | ICD-10-CM | POA: Diagnosis not present

## 2024-01-13 DIAGNOSIS — Z8679 Personal history of other diseases of the circulatory system: Secondary | ICD-10-CM

## 2024-01-13 DIAGNOSIS — G8194 Hemiplegia, unspecified affecting left nondominant side: Secondary | ICD-10-CM | POA: Diagnosis present

## 2024-01-13 DIAGNOSIS — M272 Inflammatory conditions of jaws: Secondary | ICD-10-CM | POA: Diagnosis present

## 2024-01-13 DIAGNOSIS — I129 Hypertensive chronic kidney disease with stage 1 through stage 4 chronic kidney disease, or unspecified chronic kidney disease: Secondary | ICD-10-CM | POA: Diagnosis present

## 2024-01-13 DIAGNOSIS — L03211 Cellulitis of face: Secondary | ICD-10-CM | POA: Diagnosis present

## 2024-01-13 DIAGNOSIS — E785 Hyperlipidemia, unspecified: Secondary | ICD-10-CM | POA: Diagnosis present

## 2024-01-13 DIAGNOSIS — Z86000 Personal history of in-situ neoplasm of breast: Secondary | ICD-10-CM

## 2024-01-13 DIAGNOSIS — Z0389 Encounter for observation for other suspected diseases and conditions ruled out: Secondary | ICD-10-CM | POA: Diagnosis not present

## 2024-01-13 DIAGNOSIS — Z8673 Personal history of transient ischemic attack (TIA), and cerebral infarction without residual deficits: Secondary | ICD-10-CM

## 2024-01-13 DIAGNOSIS — Z923 Personal history of irradiation: Secondary | ICD-10-CM

## 2024-01-13 LAB — COMPREHENSIVE METABOLIC PANEL WITH GFR
ALT: 16 U/L (ref 0–44)
AST: 21 U/L (ref 15–41)
Albumin: 3.8 g/dL (ref 3.5–5.0)
Alkaline Phosphatase: 74 U/L (ref 38–126)
Anion gap: 12 (ref 5–15)
BUN: 21 mg/dL (ref 8–23)
CO2: 22 mmol/L (ref 22–32)
Calcium: 9.6 mg/dL (ref 8.9–10.3)
Chloride: 103 mmol/L (ref 98–111)
Creatinine, Ser: 0.86 mg/dL (ref 0.44–1.00)
GFR, Estimated: >60 mL/min (ref >60)
Glucose, Bld: 144 mg/dL — ABNORMAL HIGH (ref 70–99)
Potassium: 3.9 mmol/L (ref 3.5–5.1)
Sodium: 137 mmol/L (ref 135–145)
Total Bilirubin: 0.8 mg/dL (ref 0.0–1.2)
Total Protein: 6.5 g/dL (ref 6.5–8.1)

## 2024-01-13 LAB — I-STAT CHEM 8, ED
BUN: 23 mg/dL (ref 8–23)
Calcium, Ion: 1.23 mmol/L (ref 1.15–1.40)
Chloride: 103 mmol/L (ref 98–111)
Creatinine, Ser: 0.9 mg/dL (ref 0.44–1.00)
Glucose, Bld: 144 mg/dL — ABNORMAL HIGH (ref 70–99)
HCT: 37 % (ref 36.0–46.0)
Hemoglobin: 12.6 g/dL (ref 12.0–15.0)
Potassium: 4 mmol/L (ref 3.5–5.1)
Sodium: 137 mmol/L (ref 135–145)
TCO2: 22 mmol/L (ref 22–32)

## 2024-01-13 LAB — DIFFERENTIAL
Abs Immature Granulocytes: 0.03 K/uL (ref 0.00–0.07)
Basophils Absolute: 0 K/uL (ref 0.0–0.1)
Basophils Relative: 0 %
Eosinophils Absolute: 0.1 K/uL (ref 0.0–0.5)
Eosinophils Relative: 1 %
Immature Granulocytes: 0 %
Lymphocytes Relative: 13 %
Lymphs Abs: 1.4 K/uL (ref 0.7–4.0)
Monocytes Absolute: 0.9 K/uL (ref 0.1–1.0)
Monocytes Relative: 9 %
Neutro Abs: 7.8 K/uL — ABNORMAL HIGH (ref 1.7–7.7)
Neutrophils Relative %: 77 %

## 2024-01-13 LAB — ETHANOL: Alcohol, Ethyl (B): <15 mg/dL (ref <15)

## 2024-01-13 LAB — URINALYSIS, ROUTINE W REFLEX MICROSCOPIC
Bacteria, UA: NONE SEEN
Bilirubin Urine: NEGATIVE
Glucose, UA: NEGATIVE mg/dL
Hgb urine dipstick: NEGATIVE
Ketones, ur: 5 mg/dL — AB
Nitrite: NEGATIVE
Protein, ur: NEGATIVE mg/dL
Specific Gravity, Urine: 1.041 — ABNORMAL HIGH (ref 1.005–1.030)
WBC, UA: >50 WBC/hpf (ref 0–5)
pH: 5 (ref 5.0–8.0)

## 2024-01-13 LAB — CBC
HCT: 34.4 % — ABNORMAL LOW (ref 36.0–46.0)
Hemoglobin: 11.5 g/dL — ABNORMAL LOW (ref 12.0–15.0)
MCH: 32.1 pg (ref 26.0–34.0)
MCHC: 33.4 g/dL (ref 30.0–36.0)
MCV: 96.1 fL (ref 80.0–100.0)
Platelets: 212 K/uL (ref 150–400)
RBC: 3.58 MIL/uL — ABNORMAL LOW (ref 3.87–5.11)
RDW: 13.6 % (ref 11.5–15.5)
WBC: 10.3 K/uL (ref 4.0–10.5)
nRBC: 0 % (ref 0.0–0.2)

## 2024-01-13 LAB — LACTIC ACID, PLASMA: Lactic Acid, Venous: 1.4 mmol/L (ref 0.5–1.9)

## 2024-01-13 LAB — RAPID URINE DRUG SCREEN, HOSP PERFORMED
Amphetamines: NOT DETECTED
Barbiturates: NOT DETECTED
Benzodiazepines: NOT DETECTED
Cocaine: NOT DETECTED
Opiates: NOT DETECTED
Tetrahydrocannabinol: NOT DETECTED

## 2024-01-13 LAB — PROTIME-INR
INR: 1 (ref 0.8–1.2)
Prothrombin Time: 13.6 s (ref 11.4–15.2)

## 2024-01-13 LAB — APTT: aPTT: 23 s — ABNORMAL LOW (ref 24–36)

## 2024-01-13 LAB — CBG MONITORING, ED: Glucose-Capillary: 151 mg/dL — ABNORMAL HIGH (ref 70–99)

## 2024-01-13 MED ORDER — CLINDAMYCIN HCL 150 MG PO CAPS
300.0000 mg | ORAL_CAPSULE | Freq: Three times a day (TID) | ORAL | Status: DC
  Administered 2024-01-14: 300 mg via ORAL
  Filled 2024-01-13: qty 2

## 2024-01-13 MED ORDER — ATORVASTATIN CALCIUM 40 MG PO TABS
40.0000 mg | ORAL_TABLET | Freq: Every day | ORAL | Status: DC
  Administered 2024-01-14 – 2024-01-15 (×2): 40 mg via ORAL
  Filled 2024-01-13 (×2): qty 1

## 2024-01-13 MED ORDER — IOHEXOL 350 MG/ML SOLN
100.0000 mL | Freq: Once | INTRAVENOUS | Status: AC | PRN
  Administered 2024-01-13: 100 mL via INTRAVENOUS

## 2024-01-13 MED ORDER — OXYCODONE HCL 5 MG PO TABS
5.0000 mg | ORAL_TABLET | ORAL | Status: DC | PRN
  Administered 2024-01-13 – 2024-01-15 (×6): 5 mg via ORAL
  Filled 2024-01-13 (×6): qty 1

## 2024-01-13 MED ORDER — CLINDAMYCIN HCL 150 MG PO CAPS: 300.0000 mg | ORAL_CAPSULE | Freq: Three times a day (TID) | ORAL | Status: DC

## 2024-01-13 MED ORDER — ACETAMINOPHEN 325 MG PO TABS
650.0000 mg | ORAL_TABLET | ORAL | Status: DC | PRN
  Administered 2024-01-14 – 2024-01-15 (×3): 650 mg via ORAL
  Filled 2024-01-13 (×3): qty 2

## 2024-01-13 MED ORDER — IRBESARTAN 150 MG PO TABS: 300.0000 mg | ORAL_TABLET | Freq: Every day | ORAL | Status: DC

## 2024-01-13 MED ORDER — SODIUM CHLORIDE 0.9 % IV BOLUS
1000.0000 mL | Freq: Once | INTRAVENOUS | Status: AC
  Administered 2024-01-13: 1000 mL via INTRAVENOUS

## 2024-01-13 MED ORDER — ASPIRIN 300 MG RE SUPP
300.0000 mg | Freq: Every day | RECTAL | Status: DC
  Filled 2024-01-13: qty 1

## 2024-01-13 MED ORDER — ENOXAPARIN SODIUM 40 MG/0.4ML IJ SOSY
40.0000 mg | PREFILLED_SYRINGE | INTRAMUSCULAR | Status: DC
  Administered 2024-01-13 – 2024-01-14 (×2): 40 mg via SUBCUTANEOUS
  Filled 2024-01-13 (×2): qty 0.4

## 2024-01-13 MED ORDER — ACETAMINOPHEN 160 MG/5ML PO SOLN: 650.0000 mg | ORAL | Status: DC | PRN

## 2024-01-13 MED ORDER — STROKE: EARLY STAGES OF RECOVERY BOOK
Freq: Once | Status: AC
  Filled 2024-01-13: qty 1

## 2024-01-13 MED ORDER — ACETAMINOPHEN 650 MG RE SUPP
650.0000 mg | RECTAL | Status: DC | PRN
Start: 2024-01-13 — End: 2024-01-15

## 2024-01-13 MED ORDER — SODIUM CHLORIDE 0.9 % IV SOLN: INTRAVENOUS | Status: AC

## 2024-01-13 MED ORDER — ASPIRIN 325 MG PO TABS
325.0000 mg | ORAL_TABLET | Freq: Every day | ORAL | Status: DC
  Administered 2024-01-13 – 2024-01-15 (×3): 325 mg via ORAL
  Filled 2024-01-13 (×3): qty 1

## 2024-01-13 MED ORDER — ACETAMINOPHEN 500 MG PO TABS
1000.0000 mg | ORAL_TABLET | Freq: Once | ORAL | Status: AC
  Administered 2024-01-13: 1000 mg via ORAL
  Filled 2024-01-13: qty 2

## 2024-01-13 MED ORDER — CLINDAMYCIN PHOSPHATE 600 MG/50ML IV SOLN
600.0000 mg | Freq: Once | INTRAVENOUS | Status: AC
  Administered 2024-01-13: 600 mg via INTRAVENOUS
  Filled 2024-01-13: qty 50

## 2024-01-13 NOTE — ED Provider Notes (Signed)
 [place] EMERGENCY DEPARTMENT AT [facility] Provider Note   CSN: [id number] Arrival date & time: 01/13/24  1814  An emergency department physician performed an initial assessment on this suspected stroke patient at 1820.  Patient presents with: Code Stroke   Jessica Woodward is a 83 y.o. female.   Patient has a history of hypertension and breast cancer.  She has an abscessed tooth on the left.  Patient was at the urgent care and had what seemed to be a syncopal episode.  When paramedics arrived she had slurred speech and right-sided weakness.  Code stroke was called.  When patient arrived in the emergency department she was back to normal  The history is provided by the patient and medical records. No language interpreter was used.  Weakness Severity:  Mild Onset quality:  Sudden Timing:  Intermittent Progression:  Resolved Chronicity:  New Context: not alcohol use   Relieved by:  Nothing Worsened by:  Nothing Associated symptoms: no abdominal pain, no chest pain, no cough, no diarrhea, no frequency, no headaches and no seizures        Prior to Admission medications   Medication Sig Start Date End Date Taking? Authorizing Provider  acetaminophen  (TYLENOL ) 325 MG tablet Take 1-2 tablets (325-650 mg total) by mouth every 4 (four) hours as needed for mild pain. 12/12/19   Lesia Ozell Barter, PA-C  atorvastatin  (LIPITOR) 40 MG tablet TAKE 1 TABLET BY MOUTH DAILY 08/15/23   Joshua Debby CROME, MD  irbesartan  (AVAPRO ) 300 MG tablet TAKE 1 TABLET BY MOUTH DAILY 10/04/23   Joshua Debby CROME, MD  loratadine (CLARITIN) 10 MG tablet Take 10 mg by mouth daily.    [provider]  Multiple Vitamin (MULTIVITAMIN) tablet Take 1 tablet by mouth daily.    [provider]  oxyCODONE  (ROXICODONE ) 5 MG immediate release tablet Take 1 tablet (5 mg total) by mouth every 4 (four) hours as needed for severe pain (pain score 7-10). 01/12/24   Theadore Ozell HERO, MD  triamcinolone   cream (KENALOG ) 0.1 % Apply 1 application topically 4 (four) times daily as needed (rash).  09/09/16   [provider]    Allergies: Anastrozole, Lisinopril, Sulfasalazine, Sulfonamide derivatives, Lactose intolerance (gi), and Evenity  [romosozumab ]    Review of Systems  Constitutional:  Negative for appetite change and fatigue.  HENT:  Negative for congestion, ear discharge and sinus pressure.        Slurred speech  Eyes:  Negative for discharge.  Respiratory:  Negative for cough.   Cardiovascular:  Negative for chest pain.  Gastrointestinal:  Negative for abdominal pain and diarrhea.  Genitourinary:  Negative for frequency and hematuria.  Musculoskeletal:  Negative for back pain.  Skin:  Negative for rash.  Neurological:  Positive for weakness. Negative for seizures and headaches.  Psychiatric/Behavioral:  Negative for hallucinations.     Updated Vital Signs BP 122/70   Pulse 67   Temp 98.3 F (36.8 C) (Oral)   Resp 15   Ht 5' 4 (1.626 m)   Wt 65.8 kg   SpO2 98%   BMI 24.90 kg/m   Physical Exam Vitals and nursing note reviewed.  Constitutional:      Appearance: She is well-developed.  HENT:     Head: Normocephalic.  Eyes:     General: No scleral icterus.    Conjunctiva/sclera: Conjunctivae normal.  Neck:     Thyroid : No thyromegaly.  Cardiovascular:     Rate and Rhythm: Normal rate and regular rhythm.  Heart sounds: No murmur heard.    No friction rub. No gallop.  Pulmonary:     Breath sounds: No stridor. No wheezing or rales.  Chest:     Chest wall: No tenderness.  Abdominal:     General: There is no distension.     Tenderness: There is no abdominal tenderness. There is no rebound.  Musculoskeletal:        General: Normal range of motion.     Cervical back: Neck supple.  Lymphadenopathy:     Cervical: No cervical adenopathy.  Skin:    Findings: No erythema or rash.  Neurological:     Mental Status: She is alert and oriented to person,  place, and time.     Motor: No abnormal muscle tone.     Coordination: Coordination normal.  Psychiatric:        Behavior: Behavior normal.     (all labs ordered are listed, but only abnormal results are displayed) Labs Reviewed  APTT - Abnormal; Notable for the following components:      Result Value   aPTT 23 (*)    All other components within normal limits  CBC - Abnormal; Notable for the following components:   RBC 3.58 (*)    Hemoglobin 11.5 (*)    HCT 34.4 (*)    All other components within normal limits  DIFFERENTIAL - Abnormal; Notable for the following components:   Neutro Abs 7.8 (*)    All other components within normal limits  COMPREHENSIVE METABOLIC PANEL WITH GFR - Abnormal; Notable for the following components:   Glucose, Bld 144 (*)    All other components within normal limits  I-STAT CHEM 8, ED - Abnormal; Notable for the following components:   Glucose, Bld 144 (*)    All other components within normal limits  CBG MONITORING, ED - Abnormal; Notable for the following components:   Glucose-Capillary 151 (*)    All other components within normal limits  ETHANOL  PROTIME-INR  LACTIC ACID, PLASMA  RAPID URINE DRUG SCREEN, HOSP PERFORMED  URINALYSIS, ROUTINE W REFLEX MICROSCOPIC  LIPID PANEL  HEMOGLOBIN A1C    EKG: EKG Interpretation Date/Time:  Friday January 13 2024 19:04:25 EDT Ventricular Rate:  68 PR Interval:  167 QRS Duration:  142 QT Interval:  458 QTC Calculation: 488 R Axis:   -72  Text Interpretation: Sinus rhythm IVCD, consider atypical RBBB LVH with secondary repolarization abnormality Inferior infarct, acute Anterior infarct, old Confirmed by Suzette Pac [id number]) on 01/13/2024 7:07:27 PM  Radiology: CT ANGIO HEAD NECK W WO CM Result Date: 01/13/2024 CLINICAL DATA:  Stroke, hemorrhagic EXAM: CT ANGIOGRAPHY HEAD AND NECK WITH AND WITHOUT CONTRAST TECHNIQUE: Multidetector CT imaging of the head and neck was performed using the standard  protocol during bolus administration of intravenous contrast. Multiplanar CT image reconstructions and MIPs were obtained to evaluate the vascular anatomy. Carotid stenosis measurements (when applicable) are obtained utilizing NASCET criteria, using the distal internal carotid diameter as the denominator. RADIATION DOSE REDUCTION: This exam was performed according to the departmental dose-optimization program which includes automated exposure control, adjustment of the mA and/or kV according to patient size and/or use of iterative reconstruction technique. CONTRAST:  OMNIPAQUE  IOHEXOL  350 MG/ML SOLN COMPARISON:  Same day CT head code stroke. FINDINGS: CTA NECK FINDINGS Aortic arch: Average origin of the subclavian artery which courses posterior to the esophagus and trachea. Right carotid system: No evidence of dissection, stenosis (50% or greater), or occlusion. Left carotid system: No evidence  of dissection, stenosis (50% or greater), or occlusion. Vertebral arteries: Left dominant. No evidence of dissection, stenosis (50% or greater), or occlusion. Skeleton: No evidence of acute abnormality on limited assessment. Other neck: No evidence of acute abnormality on limited assessment. Upper chest: Biapical pleuroparenchymal scarring. Otherwise, clear lung apices. Review of the MIP images confirms the above findings CTA HEAD FINDINGS Anterior circulation: Bilateral intracranial ICAs, MCAs, and bilateral ACAs are patent without proximal high-grade stenosis. Posterior circulation: Bilateral intradural vertebral arteries, basilar artery and bilateral posterior cerebral arteries are patent without proximal hemodynamically significant stenosis Venous sinuses: As permitted by contrast timing, patent. Review of the MIP images confirms the above findings IMPRESSION: No emergent large vessel occlusion or proximal high-grade stenosis. Electronically Signed   By: Gilmore GORMAN Molt M.D.   On: 01/13/2024 20:51   CT HEAD CODE  STROKE WO CONTRAST Result Date: 01/13/2024 CLINICAL DATA:  Code stroke. Neuro deficit, acute, stroke suspected. Left-sided weakness. Vision loss. EXAM: CT HEAD WITHOUT CONTRAST TECHNIQUE: Contiguous axial images were obtained from the base of the skull through the vertex without intravenous contrast. RADIATION DOSE REDUCTION: This exam was performed according to the departmental dose-optimization program which includes automated exposure control, adjustment of the mA and/or kV according to patient size and/or use of iterative reconstruction technique. COMPARISON:  Head CT 04/24/2020. FINDINGS: Brain: Mild generalized cerebral atrophy. Chronic lacunar infarct again demonstrated within the right caudate head. Patchy and ill-defined hypoattenuation within the cerebral white matter, nonspecific but compatible with mild chronic small vessel ischemic disease. There is no acute intracranial hemorrhage. No demarcated cortical infarct. No extra-axial fluid collection. No evidence of an intracranial mass. No midline shift. Vascular: No hyperdense vessel.  Atherosclerotic calcifications. Skull: No calvarial fracture or aggressive osseous lesion. Sinuses/Orbits: No mass or acute finding within the imaged orbits. No significant paranasal sinus disease at the imaged levels. ASPECTS (Alberta Stroke Program Early CT Score) - Ganglionic level infarction (caudate, lentiform nuclei, internal capsule, insula, M1-M3 cortex): 7 - Supraganglionic infarction (M4-M6 cortex): 3 Total score (0-10 with 10 being normal): 10 (when discounting a chronic infarct within the right caudate head). No evidence of an acute intracranial abnormality. These results were called by telephone at the time of interpretation on 01/13/2024 at 6:37 pm to provider [facility] Blayden Conwell , who verbally acknowledged these results. IMPRESSION: 1.  No evidence of an acute intracranial abnormality. 2. Redemonstrated chronic lacunar infarct within the right caudate head. 3.  Background parenchymal atrophy and chronic small vessel ischemic disease. Electronically Signed   By: Rockey Childs D.O.   On: 01/13/2024 18:38     Procedures   Medications Ordered in the ED   stroke: early stages of recovery book (has no administration in time range)  0.9 %  sodium chloride  infusion (has no administration in time range)  acetaminophen  (TYLENOL ) tablet 650 mg (has no administration in time range)    Or  acetaminophen  (TYLENOL ) 160 MG/5ML solution 650 mg (has no administration in time range)    Or  acetaminophen  (TYLENOL ) suppository 650 mg (has no administration in time range)  enoxaparin  (LOVENOX ) injection 40 mg (has no administration in time range)  aspirin  suppository 300 mg (has no administration in time range)    Or  aspirin  tablet 325 mg (has no administration in time range)  sodium chloride  0.9 % bolus 1,000 mL (0 mLs Intravenous Stopped 01/13/24 2116)  iohexol  (OMNIPAQUE ) 350 MG/ML injection 100 mL (100 mLs Intravenous Contrast Given 01/13/24 1841)  clindamycin  (CLEOCIN ) IVPB 600 mg (0 mg  Intravenous Stopped 01/13/24 2116)  acetaminophen  (TYLENOL ) tablet 1,000 mg (1,000 mg Oral Given 01/13/24 1955)  CRITICAL CARE Performed by: Fairy Sermon Total critical care time: 35 minutes Critical care time was exclusive of separately billable procedures and treating other patients. Critical care was necessary to treat or prevent imminent or life-threatening deterioration. Critical care was time spent personally by me on the following activities: development of treatment plan with patient and/or surrogate as well as nursing, discussions with consultants, evaluation of patient's response to treatment, examination of patient, obtaining history from patient or surrogate, ordering and performing treatments and interventions, ordering and review of laboratory studies, ordering and review of radiographic studies, pulse oximetry and re-evaluation of patient's condition.    Patient was  seen by neurology.  CT head and CT angio head and neck were negative.  Neurology wishes patient to be admitted to medicine for TIA workup and MRI in the morning                               Medical Decision Making Amount and/or Complexity of Data Reviewed Labs: ordered. Radiology: ordered.  Risk OTC drugs. Prescription drug management. Decision regarding hospitalization.   Syncopal episode versus TIA     Final diagnoses:  TIA (transient ischemic attack)    ED Discharge Orders     None          Sermon Fairy, MD 01/16/24 [phone]

## 2024-01-13 NOTE — Telephone Encounter (Signed)
 FYI Only or Action Required?: FYI only for provider.  Patient was last seen in primary care on 08/08/2023 by Joshua Debby CROME, MD.  Called Nurse Triage reporting Facial Swelling and Dental Problem.  Symptoms began several days ago. Pain started 2 days ago, swelling started this afternoon  Interventions attempted: OTC medications: Tylenol  and Prescription medications: oxycodone  and amoxicillin.  Symptoms are: rapidly worsening.  Triage Disposition: See HCP Within 4 Hours (Or PCP Triage)  Patient/caregiver understands and will follow disposition?: Yes- Pt agreeable to go to UC in [place]   Copied from CRM #[id number]. Topic: Clinical - Red Word Triage >> Jan 13, 2024  4:06 PM Jessica Woodward wrote: Red Word that prompted transfer to Nurse Triage: severe pain , and jaw,and cheek  is swelling , she has antibiotics, but is having to take oxycodone  and tylenol  for pain Reason for Disposition  Face is very swollen  Answer Assessment - Initial Assessment Questions 1. LOCATION: Which tooth is hurting?  (e.g., right-side/left-side, upper/lower, front/back)     Back lower left  2. ONSET: When did the toothache start?  (e.g., hours, days)      Started 2 days ago, seen by dentist and was advised there was an abscess. Pt was started on Amoxicilling 500mg , taking every 8 hours  3. SEVERITY: How bad is the toothache?  (Scale 1-10; mild, moderate or severe)     8/10 pain, pt had to go to ED yesterday because pain was uncontrolled with Extra strength Tylenol . Pt was then prescribed Oxycodone   4. SWELLING: Is there any visible swelling of your face?     Yes, visible swelling from Left jaw extending down neck  5. OTHER SYMPTOMS: Do you have any other symptoms? (e.g., fever)     No  6. PREGNANCY: Is there any chance you are pregnant? When was your last menstrual period?     No  Protocols used: Toothache-A-AH

## 2024-01-13 NOTE — H&P (Signed)
 History and Physical    Patient: Jessica Woodward FMW:[id number] DOB: [birth year] DOA: 01/13/2024 DOS: the patient was seen and examined on 01/13/2024 PCP: Joshua Debby CROME, MD  Patient coming from: Home  Chief Complaint:  Chief Complaint  Patient presents with   Code Stroke   HPI: Jessica Woodward is a 83 y.o. female with medical history significant of complete heart block status post [employer] pacemaker in 2021, history of breast cancer s/p lumpectomy, radiation and tamoxifen, hypertension.  Patient has been having a dental abscess since Wednesday.  Her dentist put her on amoxicillin, however this has not been controlling her pain.  She had to come to the hospital for increased pain medicine yesterday.  Additionally, she went to urgent care today due to the increasing pain and swelling in her left lower jaw.  While there, she began to be very lightheaded and had a possible syncopal episode.  EMS was called.  On their arrival, it appeared that she had a left facial droop with some left-sided weakness.  Her symptoms resolved en route to the hospital.  Review of Systems: As mentioned in the history of present illness. All other systems reviewed and are negative. Past Medical History:  Diagnosis Date   Allergy    Arthritis    Breast cancer (HCC)    Breast disorder    cancer   CARCINOMA, SQUAMOUS CELL, HX OF 06/05/2007   Cataract    Chronic kidney disease    DUCTAL CARCINOMA IN SITU, LEFT BREAST 2002   s/p left lumpectomy, xrt and tamoxifen   History of chemotherapy    Hyperlipemia    Hypertension    Personal history of chemotherapy 2002   Personal history of radiation therapy 2002   RADIATION THERAPY, HX OF    Seasonal allergies    Skin cancer    Past Surgical History:  Procedure Laterality Date   ABDOMINAL HYSTERECTOMY     athroscopy right knee  2013   torn miniscus   BREAST BIOPSY Left 2002   BREAST EXCISIONAL BIOPSY Right    BREAST LUMPECTOMY Left 2002   had chemo and radiation    BREAST SURGERY     Left breast lumpectomy with axillary sential node   DILATION AND CURETTAGE OF UTERUS     Left knee surgery     Dr Prentice Collet   MOHS SURGERY     (R) arm   PACEMAKER IMPLANT N/A 12/11/2019   Procedure: PACEMAKER IMPLANT;  Surgeon: Waddell Danelle ORN, MD;  Location: [facility];  Service: Cardiovascular;  Laterality: N/A;   right breast lupectomy     TONSILLECTOMY AND ADENOIDECTOMY     TUBAL LIGATION     Social History:  reports that she quit smoking about 60 years ago. Her smoking use included cigarettes. She has a 0.5 pack-year smoking history. She has never used smokeless tobacco. She reports current alcohol use. She reports that she does not use drugs.  Allergies  Allergen Reactions   Anastrozole Other (See Comments) and Rash    Causes muscle weakness   Lisinopril     cough   Sulfasalazine Hives   Sulfonamide Derivatives Hives   Lactose Intolerance (Gi) Diarrhea   Evenity  [Romosozumab ] Other (See Comments)    Severe muscle spasms    Family History  Problem Relation Age of Onset   Coronary artery disease Mother    Dementia Mother        Died at age 19   Thyroid  disease Mother  hypothyroid   Macular degeneration Father    Coronary artery disease Father    Diabetes Father    Heart disease Father    Cancer Brother        pancreatic   Heart disease Maternal Grandmother    Kidney disease Maternal Grandmother    Heart disease Maternal Grandfather    Heart disease Paternal Grandmother    Diabetes Paternal Grandmother    Heart disease Paternal Grandfather    Hypertension Son    Heart attack Son    Hypertension Son     Prior to Admission medications   Medication Sig Start Date End Date Taking? Authorizing Provider  acetaminophen  (TYLENOL ) 325 MG tablet Take 1-2 tablets (325-650 mg total) by mouth every 4 (four) hours as needed for mild pain. 12/12/19   Lesia Ozell Barter, PA-C  atorvastatin  (LIPITOR) 40 MG tablet TAKE 1 TABLET BY  MOUTH DAILY 08/15/23   Joshua Debby CROME, MD  irbesartan  (AVAPRO ) 300 MG tablet TAKE 1 TABLET BY MOUTH DAILY 10/04/23   Joshua Debby CROME, MD  loratadine (CLARITIN) 10 MG tablet Take 10 mg by mouth daily.    [provider]  Multiple Vitamin (MULTIVITAMIN) tablet Take 1 tablet by mouth daily.    [provider]  oxyCODONE  (ROXICODONE ) 5 MG immediate release tablet Take 1 tablet (5 mg total) by mouth every 4 (four) hours as needed for severe pain (pain score 7-10). 01/12/24   Theadore Ozell HERO, MD  triamcinolone  cream (KENALOG ) 0.1 % Apply 1 application topically 4 (four) times daily as needed (rash).  09/09/16   [provider]    Physical Exam: Vitals:   01/13/24 2015 01/13/24 2030 01/13/24 2045 01/13/24 2115  BP: 135/79 129/74 120/73 118/67  Pulse: 72 73 69 70  Resp: 14 14 13 15   Temp:      TempSrc:      SpO2: 94% 94% 94% 94%  Weight:      Height:       General: Elderly female. Awake and alert and oriented x3. No acute cardiopulmonary distress.  HEENT: Normocephalic atraumatic.  Right and left ears normal in appearance.  Pupils equal, round, reactive to light. Extraocular muscles are intact. Sclerae anicteric and noninjected.  Swelling in the left lower jaw.  Moist mucosal membranes. No mucosal lesions.  Neck: Neck supple without lymphadenopathy. No carotid bruits. No masses palpated.  Cardiovascular: Regular rate with normal S1-S2 sounds. No murmurs, rubs, gallops auscultated. No JVD.  Respiratory: Good respiratory effort with no wheezes, rales, rhonchi. Lungs clear to auscultation bilaterally.  No accessory muscle use. Abdomen: Soft, nontender, nondistended. Active bowel sounds. No masses or hepatosplenomegaly  Skin: No rashes, lesions, or ulcerations.  Dry, warm to touch. 2+ dorsalis pedis and radial pulses. Musculoskeletal: No calf or leg pain. All major joints not erythematous nontender.  No upper or lower joint deformation.  Good ROM.  No contractures   Psychiatric: Intact judgment and insight. Pleasant and cooperative. Neurologic: No focal neurological deficits. Strength is 5/5 and symmetric in upper and lower extremities.  Cranial nerves II through XII are grossly intact.  Data Reviewed: Labs and imaging reviewed by me  Assessment and Plan: No notes have been filed under this hospital service. Service: Hospitalist  Principal Problem:   TIA (transient ischemic attack) Active Problems:   Essential hypertension, benign   Dental abscess   History of cardiac pacemaker   History of complete heart block  TIA Observation on telemetry MRI head Echocardiogram tomorrow Hemoglobin A1c, lipid panel  in the morning PT/OT/speech therapy consult Full aspirin   Dental abscess Pain control Clindamycin  Patient is not septic Hypertension Continue antihypertensives History of complete heart block with pacemaker in place   Advance Care Planning:   Code Status: Full Code confirmed by patient  Consults: Neuro  Family Communication: None  Severity of Illness: The appropriate patient status for this patient is OBSERVATION. Observation status is judged to be reasonable and necessary in order to provide the required intensity of service to ensure the patient's safety. The patient's presenting symptoms, physical exam findings, and initial radiographic and laboratory data in the context of their medical condition is felt to place them at decreased risk for further clinical deterioration. Furthermore, it is anticipated that the patient will be medically stable for discharge from the hospital within 2 midnights of admission.   Author: Aydin Cavalieri J Darling Cieslewicz, DO 01/13/2024 9:56 PM  For on call review [email or web address].

## 2024-01-13 NOTE — ED Notes (Signed)
 Patient is being discharged from the Urgent Care and sent to the Emergency Department via EMS. Per provider, patient is in need of higher level of care due to hypotension. Patient is aware and verbalizes understanding of plan of care.  Vitals:   01/13/24 1704  BP: 105/70  Pulse: 94  Resp: 16  Temp: 98.3 F (36.8 C)  SpO2: 98%

## 2024-01-13 NOTE — ED Notes (Addendum)
 FABIENE Hammersmith PA in to evaluate pt.  Pt reported feeling lightheaded. BP 62/41. Pt then had a syncopal episode for less than 15 seconds. 911 called, IV placed to RAC.  Pts BS 145.

## 2024-01-13 NOTE — ED Notes (Signed)
 Pt left from urgent care on stretcher with EMS. She is AAOX3. Family notified that pt was being taken to the hospital.

## 2024-01-13 NOTE — ED Triage Notes (Signed)
 Pt was at [facility] and had 3 syncope episodes after taking hydrocodone . REMS stated pt LKW was 1750hrs . REMS stated pt had right sided wekaness, right sided droop and slurred speech. REMS BP 90/50.

## 2024-01-13 NOTE — ED Notes (Signed)
 Ems arrived.  Report called to Rock RN at [facility] and while giving her report the medic stated that the pt started to get unresponsive again for less than one minute.

## 2024-01-13 NOTE — ED Triage Notes (Signed)
 Pt states she has an abscessed  left lower wisdom tooth. States she saw her dentist 2 days ago and is on amoxicillin and oxycodone  but that the pain and swelling is getting worse. Left side of face swollen.

## 2024-01-13 NOTE — Consult Note (Addendum)
 [facility] Consult Services   Patient Name:   Jessica Woodward, Jessica Woodward Date of Birth:   [birth year] Identification Number:   MRN - [id number] Date of Service:   01/13/2024 18:29:22  Diagnosis:       R29.818 - Transient neurological symptoms  Impression:      83 yo female with history of HTN, HLD, breast cancer presenting to the ED with Left facial droop and Left sided weakness/numbness that is now resolved. She was at Urgent Care for a dental abscess noted to have Left facial swelling. Also had been complaining of lightheadedness and had a syncopal episode in the setting of hypotension. NIHSS 0. CT Head negative for acute findings. CTA personally reviewed, will follow-up formal read. Concern for possible toxic/metabolic etiologies v TIA. Plan to admit for MRI Brain wo and infectious/metabolic workup. ASA 325 mg for now. IV fluids.  Advanced Imaging: CTA Head and Neck Completed. LVO:No  Our recommendations are outlined below.  Recommendations:        Stroke/Telemetry Floor       Neuro Checks       Bedside Swallow Eval       DVT Prophylaxis       IV Fluids, Normal Saline       Head of Bed 30 Degrees       Euglycemia and Avoid Hyperthermia (PRN Acetaminophen )       Initiate or continue Aspirin  325 MG daily  Sign Out:       Discussed with Emergency Department Provider    ------------------------------------------------------------------------------  Advanced Imaging: Advanced imaging has been ordered. Results pending.   Metrics: Last Known Well: 01/13/2024 17:50:00 Dispatch Time: 01/13/2024 18:29:10 Arrival Time: 01/13/2024 18:14:00 Initial Response Time: 01/13/2024 18:40:25 Symptoms: Left sided facial droop, Left sided weakness. Initial patient interaction: 01/13/2024 18:45:18 NIHSS Assessment Completed: 01/13/2024 18:51:58 Patient is not a candidate for Thrombolytic. Thrombolytic Medical Decision: 01/13/2024 18:51:59 Patient was not deemed  candidate for Thrombolytic because of following reasons: Resolved symptoms .  CT Head: CT head unremarkable for acute infarction or hemorrhage per Radiology: No evidence of an acute intracranial abnormality. Redemonstrated chronic lacunar infarct within the right caudate head. Background parenchymal atrophy and chronic small vessel ischemic disease.   Primary Provider Notified of Diagnostic Impression and Management Plan on: 01/13/2024 19:09:37    ------------------------------------------------------------------------------  History of Present Illness: Patient is a 83 year old Female.  Patient was brought by EMS for symptoms of Left sided facial droop, Left sided weakness. Patient was at Urgent Care for a dental abscess when she suddenly developed Left sided facial droop and Left sided weakness/numbness at ~1750 witnessed by EMS. Patient reports that she was having some dizziness earlier, but is generally feeling weakness all over. Denies any prior history of stroke.  Per chart review, appears that she also had lightheadedness and syncopal episodes while at Urgent Care.    Past Medical History:      Hypertension      Hyperlipidemia Other PMH:  breast cancer, CKD  Medications:  No Anticoagulant use  No Antiplatelet use Reviewed EMR for current medications  Allergies:  Reviewed  Social History: Drug Use: No  Family History:  There is no family history of premature cerebrovascular disease pertinent to this consultation  ROS : 14 Points Review of Systems was performed and was negative except mentioned in HPI.  Past Surgical History: There Is No Surgical History Contributory To Today's Visit     Examination: BP(106/72), Pulse(66), 1A: Level of Consciousness - Alert; keenly responsive +  0 1B: Ask Month and Age - Both Questions Right + 0 1C: Blink Eyes & Squeeze Hands - Performs Both Tasks + 0 2: Test Horizontal Extraocular Movements - Normal + 0 3: Test Visual  Fields - No Visual Loss + 0 4: Test Facial Palsy (Use Grimace if Obtunded) - Normal symmetry + 0 5A: Test Left Arm Motor Drift - No Drift for 10 Seconds + 0 5B: Test Right Arm Motor Drift - No Drift for 10 Seconds + 0 6A: Test Left Leg Motor Drift - No Drift for 5 Seconds + 0 6B: Test Right Leg Motor Drift - No Drift for 5 Seconds + 0 7: Test Limb Ataxia (FNF/Heel-Shin) - No Ataxia + 0 8: Test Sensation - Normal; No sensory loss + 0 9: Test Language/Aphasia - Normal; No aphasia + 0 10: Test Dysarthria - Normal + 0 11: Test Extinction/Inattention - No abnormality + 0  NIHSS Score: 0   Pre-Morbid Modified Rankin Scale: 0 Points = No symptoms at all  Spoke with : Dr. Suzette I reviewed the available imaging via [facility] and initiated discussion with the primary provider  This consult was conducted in real time using interactive audio and video technology. Patient was informed of the technology being used for this visit and agreed to proceed. Patient located in hospital and provider located at home/office setting.   Patient is being evaluated for possible acute neurologic impairment and high probability of imminent or life-threatening deterioration. I spent total of 30 minutes providing care to this patient, including time for face to face visit via telemedicine, review of medical records, imaging studies and discussion of findings with providers, the patient and/or family.    Dr Joesph Essex   [facility] For Inpatient follow-up with [facility] physician please call [facility] at [phone]. As we are not an outpatient service for any post hospital discharge needs please contact the hospital for assistance. If you have any questions for the [facility] physicians or need to reconsult for clinical or diagnostic changes please contact us  via [facility] at [phone].   Signature : Joesph Essex

## 2024-01-13 NOTE — ED Notes (Signed)
CODE STROKE paged out at this time.

## 2024-01-13 NOTE — ED Provider Notes (Signed)
 [facility]    CSN: [id number] Arrival date & time: 01/13/24  1630      History   Chief Complaint Chief Complaint  Patient presents with   Dental Pain    HPI Jessica Woodward is a 83 y.o. female.   Presenting today with 2-day history of progressively worsening left lower dental pain and infection in the wisdom tooth.  Saw her dentist 2 days ago and was started on Augmentin but was having trouble getting the pain under control so went to the emergency department yesterday where oxycodone  was prescribed for pain relief.  She states the left side of face continues to swell and her pain progressively worsens.  Took an oxycodone  prior to arrival, states these are not touching the pain.  Is scheduled for a dental procedure for this on Monday but could not wait that long.  Denies fever, chills, difficulty breathing or swallowing, known bleeding or drainage from the area.  Currently states she feels weak and like she is going to pass out.  She states she has been able to eat little bits of soft foods such as smoothies and protein shakes throughout the day today.    Past Medical History:  Diagnosis Date   Allergy    Arthritis    Breast cancer (HCC)    Breast disorder    cancer   CARCINOMA, SQUAMOUS CELL, HX OF 06/05/2007   Cataract    Chronic kidney disease    DUCTAL CARCINOMA IN SITU, LEFT BREAST 2002   s/p left lumpectomy, xrt and tamoxifen   History of chemotherapy    Hyperlipemia    Hypertension    Personal history of chemotherapy 2002   Personal history of radiation therapy 2002   RADIATION THERAPY, HX OF    Seasonal allergies    Skin cancer     Patient Active Problem List   Diagnosis Date Noted   Foreign body of left ear 08/09/2023   Flu vaccine need 02/10/2023   Estrogen deficiency 02/10/2023   Anemia due to zinc  deficiency 03/07/2022   Iron deficiency anemia secondary to inadequate dietary iron intake 03/05/2022   Vitamin B12 deficiency anemia due to  intrinsic factor deficiency 03/04/2022   Abnormal electrocardiogram (ECG) (EKG) 02/22/2022   Lobular carcinoma in situ (LCIS) of breast 01/21/2021   Atherosclerosis of aorta (HCC) 01/21/2021   DDD (degenerative disc disease), cervical 05/10/2019   Seasonal allergic rhinitis due to pollen 02/15/2017   Sensorineural hearing loss, bilateral 12/27/2016   Essential hypertension, benign 09/11/2013   Dyslipidemia, goal LDL below 130 01/17/2012   DUCTAL CARCINOMA IN SITU, LEFT BREAST 06/05/2007    Past Surgical History:  Procedure Laterality Date   ABDOMINAL HYSTERECTOMY     athroscopy right knee  2013   torn miniscus   BREAST BIOPSY Left 2002   BREAST EXCISIONAL BIOPSY Right    BREAST LUMPECTOMY Left 2002   had chemo and radiation   BREAST SURGERY     Left breast lumpectomy with axillary sential node   DILATION AND CURETTAGE OF UTERUS     Left knee surgery     Dr Prentice Collet   MOHS SURGERY     (R) arm   PACEMAKER IMPLANT N/A 12/11/2019   Procedure: PACEMAKER IMPLANT;  Surgeon: Waddell Danelle ORN, MD;  Location: [facility];  Service: Cardiovascular;  Laterality: N/A;   right breast lupectomy     TONSILLECTOMY AND ADENOIDECTOMY     TUBAL LIGATION      OB  History     Gravida  3   Para  2   Term      Preterm      AB  1   Living  2      SAB  1   IAB      Ectopic      Multiple      Live Births  2            Home Medications    Prior to Admission medications   Medication Sig Start Date End Date Taking? Authorizing Provider  acetaminophen  (TYLENOL ) 325 MG tablet Take 1-2 tablets (325-650 mg total) by mouth every 4 (four) hours as needed for mild pain. 12/12/19   Lesia Ozell Barter, PA-C  atorvastatin  (LIPITOR) 40 MG tablet TAKE 1 TABLET BY MOUTH DAILY 08/15/23   Joshua Debby CROME, MD  irbesartan  (AVAPRO ) 300 MG tablet TAKE 1 TABLET BY MOUTH DAILY 10/04/23   Joshua Debby CROME, MD  loratadine (CLARITIN) 10 MG tablet Take 10 mg by mouth daily.     [provider]  Multiple Vitamin (MULTIVITAMIN) tablet Take 1 tablet by mouth daily.    [provider]  oxyCODONE  (ROXICODONE ) 5 MG immediate release tablet Take 1 tablet (5 mg total) by mouth every 4 (four) hours as needed for severe pain (pain score 7-10). 01/12/24   Theadore Ozell HERO, MD  triamcinolone  cream (KENALOG ) 0.1 % Apply 1 application topically 4 (four) times daily as needed (rash).  09/09/16   [provider]    Family History Family History  Problem Relation Age of Onset   Coronary artery disease Mother    Dementia Mother        Died at age 56   Thyroid  disease Mother        hypothyroid   Macular degeneration Father    Coronary artery disease Father    Diabetes Father    Heart disease Father    Cancer Brother        pancreatic   Heart disease Maternal Grandmother    Kidney disease Maternal Grandmother    Heart disease Maternal Grandfather    Heart disease Paternal Grandmother    Diabetes Paternal Grandmother    Heart disease Paternal Grandfather    Hypertension Son    Heart attack Son    Hypertension Son     Social History Social History   Tobacco Use   Smoking status: Former    Current packs/day: 0.00    Average packs/day: 0.3 packs/day for 2.0 years (0.5 ttl pk-yrs)    Types: Cigarettes    Quit date: 12/28/1963    Years since quitting: 60.0   Smokeless tobacco: Never  Vaping Use   Vaping status: Never Used  Substance Use Topics   Alcohol use: Yes    Comment: rare glass of wine   Drug use: No     Allergies   Anastrozole, Lisinopril, Sulfasalazine, Sulfonamide derivatives, Lactose intolerance (gi), and Evenity  [romosozumab ]   Review of Systems Review of Systems Per HPI  Physical Exam Triage Vital Signs ED Triage Vitals  Encounter Vitals Group     BP 01/13/24 1704 105/70     Girls Systolic BP Percentile --      Girls Diastolic BP Percentile --      Boys Systolic BP Percentile --      Boys Diastolic BP Percentile  --      Pulse Rate 01/13/24 1704 94     Resp 01/13/24 1704 16  Temp 01/13/24 1704 98.3 F (36.8 C)     Temp Source 01/13/24 1704 Oral     SpO2 01/13/24 1704 98 %     Weight --      Height --      Head Circumference --      Peak Flow --      Pain Score 01/13/24 1705 8     Pain Loc --      Pain Education --      Exclude from Growth Chart --    No data found.  Updated Vital Signs BP 105/70 (BP Location: Right Arm)   Pulse 94   Temp 98.3 F (36.8 C) (Oral)   Resp 16   SpO2 98%   Visual Acuity Right Eye Distance:   Left Eye Distance:   Bilateral Distance:    Right Eye Near:   Left Eye Near:    Bilateral Near:     Physical Exam Vitals and nursing note reviewed.  Constitutional:      Appearance: Normal appearance. She is not ill-appearing.  HENT:     Head: Atraumatic.     Mouth/Throat:     Mouth: Mucous membranes are moist.     Comments: Gingival erythema and edema to the left lower posterior molar.  Moderate coordinating facial swelling to the left jaw Eyes:     Extraocular Movements: Extraocular movements intact.     Conjunctiva/sclera: Conjunctivae normal.  Cardiovascular:     Rate and Rhythm: Normal rate and regular rhythm.     Heart sounds: Normal heart sounds.  Pulmonary:     Effort: Pulmonary effort is normal.     Breath sounds: Normal breath sounds.  Musculoskeletal:        General: Normal range of motion.     Cervical back: Normal range of motion and neck supple.  Skin:    General: Skin is warm and dry.     Coloration: Skin is pale.  Neurological:     Mental Status: She is alert and oriented to person, place, and time.  Psychiatric:        Mood and Affect: Mood normal.        Thought Content: Thought content normal.        Judgment: Judgment normal.      UC Treatments / Results  Labs (all labs ordered are listed, but only abnormal results are displayed) Labs Reviewed - No data to display  EKG   Radiology No results  found.  Procedures Procedures (including critical care time)  Medications Ordered in UC Medications - No data to display  Initial Impression / Assessment and Plan / UC Course  I have reviewed the triage vital signs and the nursing notes.  Pertinent labs & imaging results that were available during my care of the patient were reviewed by me and considered in my medical decision making (see chart for details).     While discussing plan which consisted of continuing the Augmentin and following up on Monday versus switching to clindamycin  to see if this improved the suspected infection better/quicker, she stated that she felt like she was going to pass out.  A cool rag was applied to her forehead and point-of-care CBG was attempted but during this patient became unresponsive.  She was immediately laid back flat and transitioned onto her side as she began having convulsions for about 20 seconds.  After this time, she was able to answer questions appropriately and appeared alert and oriented but slightly drowsy.  EMS was called, peripheral IV was placed, CBG was obtained at 154.  Just prior to the syncopal episode, her blood pressure was being rechecked and read in the 60s over 40s range.  After the episode once back alert and oriented her blood pressure was in the 80s over 50s range.  EMS came for transport to the emergency department and her close friend was contacted per her request.  Suspect vasovagal episode, possibly secondary to the oxycodone  dropping her blood pressures but feel she requires further evaluation and monitoring in the emergency department.  Final Clinical Impressions(s) / UC Diagnoses   Final diagnoses:  Dental infection  Hypotension, unspecified hypotension type  Syncope, unspecified syncope type   Discharge Instructions   None    ED Prescriptions   None    PDMP not reviewed this encounter.   Stuart Vernell Norris, [place] 01/13/24 1755

## 2024-01-14 ENCOUNTER — Observation Stay (HOSPITAL_COMMUNITY)

## 2024-01-14 DIAGNOSIS — I129 Hypertensive chronic kidney disease with stage 1 through stage 4 chronic kidney disease, or unspecified chronic kidney disease: Secondary | ICD-10-CM | POA: Diagnosis present

## 2024-01-14 DIAGNOSIS — R55 Syncope and collapse: Secondary | ICD-10-CM | POA: Diagnosis not present

## 2024-01-14 DIAGNOSIS — E739 Lactose intolerance, unspecified: Secondary | ICD-10-CM | POA: Diagnosis present

## 2024-01-14 DIAGNOSIS — Z1152 Encounter for screening for COVID-19: Secondary | ICD-10-CM | POA: Diagnosis not present

## 2024-01-14 DIAGNOSIS — Z87891 Personal history of nicotine dependence: Secondary | ICD-10-CM | POA: Diagnosis not present

## 2024-01-14 DIAGNOSIS — G459 Transient cerebral ischemic attack, unspecified: Secondary | ICD-10-CM | POA: Diagnosis present

## 2024-01-14 DIAGNOSIS — Z923 Personal history of irradiation: Secondary | ICD-10-CM | POA: Diagnosis not present

## 2024-01-14 DIAGNOSIS — Z833 Family history of diabetes mellitus: Secondary | ICD-10-CM | POA: Diagnosis not present

## 2024-01-14 DIAGNOSIS — Z86000 Personal history of in-situ neoplasm of breast: Secondary | ICD-10-CM | POA: Diagnosis not present

## 2024-01-14 DIAGNOSIS — K047 Periapical abscess without sinus: Secondary | ICD-10-CM | POA: Diagnosis present

## 2024-01-14 DIAGNOSIS — Z853 Personal history of malignant neoplasm of breast: Secondary | ICD-10-CM | POA: Diagnosis not present

## 2024-01-14 DIAGNOSIS — G901 Familial dysautonomia [Riley-Day]: Secondary | ICD-10-CM | POA: Diagnosis present

## 2024-01-14 DIAGNOSIS — R609 Edema, unspecified: Secondary | ICD-10-CM | POA: Diagnosis not present

## 2024-01-14 DIAGNOSIS — L03211 Cellulitis of face: Secondary | ICD-10-CM | POA: Diagnosis present

## 2024-01-14 DIAGNOSIS — M272 Inflammatory conditions of jaws: Secondary | ICD-10-CM | POA: Diagnosis present

## 2024-01-14 DIAGNOSIS — I10 Essential (primary) hypertension: Secondary | ICD-10-CM | POA: Diagnosis not present

## 2024-01-14 DIAGNOSIS — Z8249 Family history of ischemic heart disease and other diseases of the circulatory system: Secondary | ICD-10-CM | POA: Diagnosis not present

## 2024-01-14 DIAGNOSIS — Z8673 Personal history of transient ischemic attack (TIA), and cerebral infarction without residual deficits: Secondary | ICD-10-CM | POA: Diagnosis not present

## 2024-01-14 DIAGNOSIS — I6782 Cerebral ischemia: Secondary | ICD-10-CM | POA: Diagnosis not present

## 2024-01-14 DIAGNOSIS — Z950 Presence of cardiac pacemaker: Secondary | ICD-10-CM | POA: Diagnosis not present

## 2024-01-14 DIAGNOSIS — Z79899 Other long term (current) drug therapy: Secondary | ICD-10-CM | POA: Diagnosis not present

## 2024-01-14 DIAGNOSIS — R29810 Facial weakness: Secondary | ICD-10-CM | POA: Diagnosis present

## 2024-01-14 DIAGNOSIS — G8194 Hemiplegia, unspecified affecting left nondominant side: Secondary | ICD-10-CM | POA: Diagnosis present

## 2024-01-14 DIAGNOSIS — R221 Localized swelling, mass and lump, neck: Secondary | ICD-10-CM | POA: Diagnosis not present

## 2024-01-14 DIAGNOSIS — I442 Atrioventricular block, complete: Secondary | ICD-10-CM | POA: Diagnosis present

## 2024-01-14 DIAGNOSIS — Z85828 Personal history of other malignant neoplasm of skin: Secondary | ICD-10-CM | POA: Diagnosis not present

## 2024-01-14 DIAGNOSIS — Z90710 Acquired absence of both cervix and uterus: Secondary | ICD-10-CM | POA: Diagnosis not present

## 2024-01-14 DIAGNOSIS — Z9221 Personal history of antineoplastic chemotherapy: Secondary | ICD-10-CM | POA: Diagnosis not present

## 2024-01-14 DIAGNOSIS — E785 Hyperlipidemia, unspecified: Secondary | ICD-10-CM | POA: Diagnosis present

## 2024-01-14 DIAGNOSIS — R4781 Slurred speech: Secondary | ICD-10-CM | POA: Diagnosis present

## 2024-01-14 DIAGNOSIS — R42 Dizziness and giddiness: Secondary | ICD-10-CM | POA: Diagnosis not present

## 2024-01-14 DIAGNOSIS — K122 Cellulitis and abscess of mouth: Secondary | ICD-10-CM | POA: Diagnosis not present

## 2024-01-14 LAB — ECHOCARDIOGRAM COMPLETE
AR max vel: 2.72 cm2
AV Area VTI: 2.75 cm2
AV Area mean vel: 2.89 cm2
AV Mean grad: 3.1 mmHg
AV Peak grad: 6.1 mmHg
Ao pk vel: 1.24 m/s
Area-P 1/2: 4.1 cm2
Calc EF: 68.6 %
Height: 64 in
MV M vel: 5.08 m/s
MV Peak grad: 103.2 mmHg
S' Lateral: 2.2 cm
Single Plane A2C EF: 67.9 %
Single Plane A4C EF: 69.3 %
Weight: 2321 [oz_av]

## 2024-01-14 LAB — HEMOGLOBIN A1C
Hgb A1c MFr Bld: 5.5 % (ref 4.8–5.6)
Mean Plasma Glucose: 111.15 mg/dL

## 2024-01-14 LAB — LIPID PANEL
Cholesterol: 124 mg/dL (ref 0–200)
HDL: 57 mg/dL (ref >40)
LDL Cholesterol: 58 mg/dL (ref 0–99)
Total CHOL/HDL Ratio: 2.2 ratio
Triglycerides: 44 mg/dL (ref <150)
VLDL: 9 mg/dL (ref 0–40)

## 2024-01-14 MED ORDER — PIPERACILLIN-TAZOBACTAM 3.375 G IVPB
3.3750 g | Freq: Three times a day (TID) | INTRAVENOUS | Status: DC
  Administered 2024-01-14 – 2024-01-15 (×5): 3.375 g via INTRAVENOUS
  Filled 2024-01-14 (×5): qty 50

## 2024-01-14 MED ORDER — IOHEXOL 300 MG/ML  SOLN
75.0000 mL | Freq: Once | INTRAMUSCULAR | Status: AC | PRN
  Administered 2024-01-14: 75 mL via INTRAVENOUS

## 2024-01-14 MED ORDER — LACTATED RINGERS IV BOLUS
1000.0000 mL | Freq: Once | INTRAVENOUS | Status: AC
  Administered 2024-01-14: 1000 mL via INTRAVENOUS

## 2024-01-14 MED ORDER — HYDROMORPHONE HCL 1 MG/ML IJ SOLN
0.5000 mg | INTRAMUSCULAR | Status: DC | PRN
  Administered 2024-01-14 (×2): 0.5 mg via INTRAVENOUS
  Filled 2024-01-14 (×2): qty 0.5

## 2024-01-14 NOTE — Progress Notes (Addendum)
 PROGRESS NOTE  Jessica Woodward FMW:[id number] DOB: 1940/05/25 DOA: 01/13/2024 PCP: Joshua Debby CROME, MD  Brief History:  83 year old female with a history of complete heart block status post PPM, hypertension, dyslipidemia, and breast cancer presenting with left facial droop and left-sided weakness.  The patient states that she has been dealing with a dental infection since 01/08/2024 which is when she began having some pain in her left lower molar.  She went to see her dentist on 01/11/2024.  He started her on amoxicillin .  She continued to have pain in her left lower jaw.  She noted some swelling.  She went to urgent care on 01/13/2024.  During her visit, the patient became dizzy and subsequently had a syncopal episode.  Review of the room notes shows that the patient may have had some convulsions.  Upon EMS arrival, they noted the patient had left facial droop and left-sided weakness.  Therefore, code stroke was activated. Prior to the event, the patient had not had any fevers, chills, chest pain, shortness of breath, nausea, vomiting, diarrhea.  She has not been started any new medications. She states that she has been able to chew with some pain in her left jaw.  She denies any neck swelling. In the ED, the patient was afebrile and hemodynamically stable with oxygen  saturation 98% room air.  WBC 10.3, hemoglobin 11.5, platelet 212.  Sodium 137, potassium 3.9, bicarbonate 22, serum creatinine 0.86.  LFTs unremarkable.  CTA head and neck was negative for LVO, and negative for emergent high-grade stenosis proximally.  CT of the brain was negative for any acute findings.  It did show chronic lacunar infarct in the right caudate. Apparently, the patient was admitted for workup of her neurologic symptoms. Regarding her dental infection, the patient states that she has a follow-up appointment with her dentist for her tooth to be removed on 01/15/2024.   Assessment/Plan:  Syncope/transient left  hemiparesis - Secondary to vasovagal phenomenon/dysautonomia vs convulsive syncope - The patient was noted to have blood pressure 80 over 50s at the urgent care. - Check orthostatics--neg - Echo--EF 60-65%, no WMA, normal RVF, no PFO - Give IV fluids - CTA head and neck--negative LVO, negative for emergent high-grade stenosis - LDL 58 - Hemoglobin A1c--pending -  discussed with neurology, Dr. Theador not need to wait in hospital for MR brain--can be done as outpt as long as pt has no new deficits going forward - repeat CT brain prior to d/c - 01/13/24 CTA H&N as discussed above--neg LVO  Dental infection/submandibular/facial cellulitis - Start empiric Zosyn  for now - 8/22 CT neck soft tissues--appears to be Odontogenic - associated with periapical lucency and dehiscence from the left mandible wisdom tooth. No abscess is identified. - prm dilaudid  for pain - pt has appointment with dentist on 01/16/24  Essential hypertension - Temporarily holding ARB to allow for permissive hypertension  Dyslipidemia - Continue aspirin   Complete heart block - Status post permanent pacemaker - Follow-up with Dr. Waddell         Family Communication: son updated at bedside 8/23  Consultants:  neurology  Code Status:  FULL   DVT Prophylaxis:   [place] Lovenox    Procedures: As Listed in Progress Note Above  Antibiotics: Zosyn  8/23>>      Subjective: Patient states that her left jaw pain is little better.  She denies any fevers, chills, chest pain, shortness of breath.  She denies any focal extremity weakness.  Objective: Vitals:   01/13/24 2204 01/13/24 2217 01/14/24 0301 01/14/24 0603  BP:  131/74 111/74 120/76  Pulse:   68 62  Resp:  18 18 18   Temp: 98 F (36.7 C) 98 F (36.7 C) 98.2 F (36.8 C) 98.6 F (37 C)  TempSrc: Oral Oral Oral Oral  SpO2:  97% 100% 100%  Weight:      Height:  5' 4 (1.626 m)      Intake/Output Summary (Last 24 hours) at 01/14/2024  0757 Last data filed at 01/14/2024 0400 Gross per 24 hour  Intake 1350 ml  Output 400 ml  Net 950 ml   Weight change:  Exam:  General:  Pt is alert, follows commands appropriately, not in acute distress HEENT: No icterus, No thrush, mild swelling of the left submandible area. Cardiovascular: RRR, S1/S2, no rubs, no gallops Respiratory: CTA bilaterally, no wheezing, no crackles, no rhonchi Abdomen: Soft/+BS, non tender, non distended, no guarding Extremities: No edema, No lymphangitis, No petechiae, No rashes, no synovitis Neuro:  CN II-XII intact, strength 4/5 in RUE, RLE, strength 4/5 LUE, LLE; sensation intact bilateral; no dysmetria; babinski equivocal    Data Reviewed: I have personally reviewed following labs and imaging studies Basic Metabolic Panel: Recent Labs  Lab 01/13/24 1817 01/13/24 1828  NA 137 137  K 3.9 4.0  CL 103 103  CO2 22  --   GLUCOSE 144* 144*  BUN 21 23  CREATININE 0.86 0.90  CALCIUM  9.6  --    Liver Function Tests: Recent Labs  Lab 01/13/24 1817  AST 21  ALT 16  ALKPHOS 74  BILITOT 0.8  PROT 6.5  ALBUMIN 3.8   No results for input(s): LIPASE, AMYLASE in the last 168 hours. No results for input(s): AMMONIA in the last 168 hours. Coagulation Profile: Recent Labs  Lab 01/13/24 1817  INR 1.0   CBC: Recent Labs  Lab 01/13/24 1817 01/13/24 1828  WBC 10.3  --   NEUTROABS 7.8*  --   HGB 11.5* 12.6  HCT 34.4* 37.0  MCV 96.1  --   PLT 212  --    Cardiac Enzymes: No results for input(s): CKTOTAL, CKMB, CKMBINDEX, TROPONINI in the last 168 hours. BNP: Invalid input(s): POCBNP CBG: Recent Labs  Lab 01/13/24 1817  GLUCAP 151*   HbA1C: No results for input(s): HGBA1C in the last 72 hours. Urine analysis:    Component Value Date/Time   COLORURINE YELLOW 01/13/2024 2250   APPEARANCEUR HAZY (A) 01/13/2024 2250   LABSPEC 1.041 (H) 01/13/2024 2250   PHURINE 5.0 01/13/2024 2250   GLUCOSEU NEGATIVE 01/13/2024  2250   GLUCOSEU NEGATIVE 01/21/2021 0920   HGBUR NEGATIVE 01/13/2024 2250   BILIRUBINUR NEGATIVE 01/13/2024 2250   KETONESUR 5 (A) 01/13/2024 2250   PROTEINUR NEGATIVE 01/13/2024 2250   UROBILINOGEN 0.2 01/21/2021 0920   NITRITE NEGATIVE 01/13/2024 2250   LEUKOCYTESUR LARGE (A) 01/13/2024 2250   Sepsis Labs: @LABRCNTIP (procalcitonin:4,lacticidven:4) )No results found for this or any previous visit (from the past 240 hours).   Scheduled Meds:   stroke: early stages of recovery book   Does not apply Once   aspirin   300 mg Rectal Daily   Or   aspirin   325 mg Oral Daily   atorvastatin   40 mg Oral Daily   clindamycin   300 mg Oral Q8H   enoxaparin  (LOVENOX ) injection  40 mg Subcutaneous Q24H   irbesartan   300 mg Oral Daily   Continuous Infusions:  sodium chloride  40 mL/hr at 01/13/24 2158  Procedures/Studies: CT ANGIO HEAD NECK W WO CM Result Date: 01/13/2024 CLINICAL DATA:  Stroke, hemorrhagic EXAM: CT ANGIOGRAPHY HEAD AND NECK WITH AND WITHOUT CONTRAST TECHNIQUE: Multidetector CT imaging of the head and neck was performed using the standard protocol during bolus administration of intravenous contrast. Multiplanar CT image reconstructions and MIPs were obtained to evaluate the vascular anatomy. Carotid stenosis measurements (when applicable) are obtained utilizing NASCET criteria, using the distal internal carotid diameter as the denominator. RADIATION DOSE REDUCTION: This exam was performed according to the departmental dose-optimization program which includes automated exposure control, adjustment of the mA and/or kV according to patient size and/or use of iterative reconstruction technique. CONTRAST:  OMNIPAQUE  IOHEXOL  350 MG/ML SOLN COMPARISON:  Same day CT head code stroke. FINDINGS: CTA NECK FINDINGS Aortic arch: Average origin of the subclavian artery which courses posterior to the esophagus and trachea. Right carotid system: No evidence of dissection, stenosis (50% or  greater), or occlusion. Left carotid system: No evidence of dissection, stenosis (50% or greater), or occlusion. Vertebral arteries: Left dominant. No evidence of dissection, stenosis (50% or greater), or occlusion. Skeleton: No evidence of acute abnormality on limited assessment. Other neck: No evidence of acute abnormality on limited assessment. Upper chest: Biapical pleuroparenchymal scarring. Otherwise, clear lung apices. Review of the MIP images confirms the above findings CTA HEAD FINDINGS Anterior circulation: Bilateral intracranial ICAs, MCAs, and bilateral ACAs are patent without proximal high-grade stenosis. Posterior circulation: Bilateral intradural vertebral arteries, basilar artery and bilateral posterior cerebral arteries are patent without proximal hemodynamically significant stenosis Venous sinuses: As permitted by contrast timing, patent. Review of the MIP images confirms the above findings IMPRESSION: No emergent large vessel occlusion or proximal high-grade stenosis. Electronically Signed   By: Gilmore GORMAN Molt M.D.   On: 01/13/2024 20:51   CT HEAD CODE STROKE WO CONTRAST Result Date: 01/13/2024 CLINICAL DATA:  Code stroke. Neuro deficit, acute, stroke suspected. Left-sided weakness. Vision loss. EXAM: CT HEAD WITHOUT CONTRAST TECHNIQUE: Contiguous axial images were obtained from the base of the skull through the vertex without intravenous contrast. RADIATION DOSE REDUCTION: This exam was performed according to the departmental dose-optimization program which includes automated exposure control, adjustment of the mA and/or kV according to patient size and/or use of iterative reconstruction technique. COMPARISON:  Head CT 04/24/2020. FINDINGS: Brain: Mild generalized cerebral atrophy. Chronic lacunar infarct again demonstrated within the right caudate head. Patchy and ill-defined hypoattenuation within the cerebral white matter, nonspecific but compatible with mild chronic small vessel  ischemic disease. There is no acute intracranial hemorrhage. No demarcated cortical infarct. No extra-axial fluid collection. No evidence of an intracranial mass. No midline shift. Vascular: No hyperdense vessel.  Atherosclerotic calcifications. Skull: No calvarial fracture or aggressive osseous lesion. Sinuses/Orbits: No mass or acute finding within the imaged orbits. No significant paranasal sinus disease at the imaged levels. ASPECTS ([place]berta Stroke Program Early CT Score) - Ganglionic level infarction (caudate, lentiform nuclei, internal capsule, insula, M1-M3 cortex): 7 - Supraganglionic infarction (M4-M6 cortex): 3 Total score (0-10 with 10 being normal): 10 (when discounting a chronic infarct within the right caudate head). No evidence of an acute intracranial abnormality. These results were called by telephone at the time of interpretation on 01/13/2024 at 6:37 pm to provider [facility] ZAMMIT , who verbally acknowledged these results. IMPRESSION: 1.  No evidence of an acute intracranial abnormality. 2. Redemonstrated chronic lacunar infarct within the right caudate head. 3. Background parenchymal atrophy and chronic small vessel ischemic disease. Electronically Signed   By: Rockey  Richelle D.O.   On: 01/13/2024 18:38   CUP PACEART REMOTE DEVICE CHECK Result Date: 12/21/2023 PPM Scheduled remote reviewed. Normal device function.  Presenting rhythm:  AS/VP Next remote 91 days. [place], CVRS   Alm Schneider, DO  [facility]  If 7PM-7AM, please contact night-coverage [email or web address] Password [facility] 01/14/2024, 7:57 AM   LOS: 0 days

## 2024-01-14 NOTE — Plan of Care (Signed)
  Problem: Education: Goal: Knowledge of General Education information will improve Description: Including pain rating scale, medication(s)/side effects and non-pharmacologic comfort measures Outcome: Progressing   Problem: Clinical Measurements: Goal: Ability to maintain clinical measurements within normal limits will improve Outcome: Progressing Goal: Will remain free from infection Outcome: Progressing Goal: Diagnostic test results will improve Outcome: Progressing Goal: Respiratory complications will improve Outcome: Progressing Goal: Cardiovascular complication will be avoided Outcome: Progressing   Problem: Nutrition: Goal: Adequate nutrition will be maintained Outcome: Progressing   Problem: Coping: Goal: Level of anxiety will decrease Outcome: Progressing   Problem: Pain Managment: Goal: General experience of comfort will improve and/or be controlled Outcome: Progressing   Problem: Safety: Goal: Ability to remain free from injury will improve Outcome: Progressing   Problem: Skin Integrity: Goal: Risk for impaired skin integrity will decrease Outcome: Progressing   Problem: Education: Goal: Knowledge of disease or condition will improve Outcome: Progressing Goal: Knowledge of secondary prevention will improve (MUST DOCUMENT ALL) Outcome: Progressing Goal: Knowledge of patient specific risk factors will improve (DELETE if not current risk factor) Outcome: Progressing   Problem: Coping: Goal: Will verbalize positive feelings about self Outcome: Progressing Goal: Will identify appropriate support needs Outcome: Progressing

## 2024-01-14 NOTE — Plan of Care (Signed)

## 2024-01-14 NOTE — Evaluation (Signed)
 Physical Therapy Evaluation Patient Details Name: Jessica Woodward MRN: [id number] DOB: 1941-04-24 Today's Date: 01/14/2024  History of Present Illness  Jessica Woodward is a 83 y.o. female with medical history significant of complete heart block status post [employer] pacemaker in 2021, history of breast cancer s/p lumpectomy, radiation and tamoxifen, hypertension.  Patient has been having a dental abscess since Wednesday.  Her dentist put her on amoxicillin , however this has not been controlling her pain.  She had to come to the hospital for increased pain medicine yesterday.  Additionally, she went to urgent care today due to the increasing pain and swelling in her left lower jaw.  While there, she began to be very lightheaded and had a possible syncopal episode.  EMS was called.  On their arrival, it appeared that she had a left facial droop with some left-sided weakness.  Her symptoms resolved en route to the hospital.    Clinical Impression  Patient pleasant and agreeable to therapist assessment.  She is independent with bed mobility and sit to stand.  Able to stand and balance without AD.  Orthostatics taken today supine 132/68, sitting 144/78 and standing 134/68.  Patient endorses slowly decreasing pain levels left side of mouth and face.  Patient with no need for further PT services at this time; will discharge to the care of nursing.  patient left in bed with call button in reach, bed alarm set and nursing notified of mobility status         If plan is discharge home, recommend the following:     Can travel by private vehicle        Equipment Recommendations None recommended by PT  Recommendations for Other Services       Functional Status Assessment Patient has had a recent decline in their functional status and demonstrates the ability to make significant improvements in function in a reasonable and predictable amount of time.     Precautions / Restrictions  Precautions Precautions: None Recall of Precautions/Restrictions: Intact Restrictions Weight Bearing Restrictions Per Provider Order: No      Mobility  Bed Mobility Overal bed mobility: Independent               Patient Response: Cooperative  Transfers Overall transfer level: Independent                      Ambulation/Gait               General Gait Details: hooked up for bolus so did not ambulate  Stairs            Wheelchair Mobility     Tilt Bed Tilt Bed Patient Response: Cooperative  Modified Rankin (Stroke Patients Only)       Balance Overall balance assessment: Independent                                           Pertinent Vitals/Pain Pain Assessment Pain Assessment: 0-10 Pain Score: 6  Pain Location: left side of her mouth Pain Descriptors / Indicators: Throbbing, Tender Pain Intervention(s): Monitored during session    Home Living Family/patient expects to be discharged to:: Private residence Living Arrangements: Alone Available Help at Discharge: Family;Available PRN/intermittently Type of Home: House Home Access: Level entry       Home Layout: One level Home Equipment: Grab bars - tub/shower  Prior Function Prior Level of Function : Independent/Modified Independent               ADLs Comments: drives and shops independently     Extremity/Trunk Assessment   Upper Extremity Assessment Upper Extremity Assessment: Right hand dominant    Lower Extremity Assessment Lower Extremity Assessment: Overall WFL for tasks assessed    Cervical / Trunk Assessment Cervical / Trunk Assessment: Normal  Communication   Communication Communication: No apparent difficulties Factors Affecting Communication: Hearing impaired (hearing aids)    Cognition Arousal: Alert Behavior During Therapy: WFL for tasks assessed/performed   PT - Cognitive impairments: No apparent impairments                                  Cueing       General Comments      Exercises     Assessment/Plan    PT Assessment Patient does not need any further PT services  PT Problem List         PT Treatment Interventions      PT Goals (Current goals can be found in the Care Plan section)  Acute Rehab PT Goals Patient Stated Goal: return home PT Goal Formulation: With patient Time For Goal Achievement: 01/28/24 Potential to Achieve Goals: Good    Frequency       Co-evaluation               AM-PAC PT 6 Clicks Mobility  Outcome Measure Help needed turning from your back to your side while in a flat bed without using bedrails?: None Help needed moving from lying on your back to sitting on the side of a flat bed without using bedrails?: None Help needed moving to and from a bed to a chair (including a wheelchair)?: None Help needed standing up from a chair using your arms (e.g., wheelchair or bedside chair)?: None Help needed to walk in hospital room?: A Little Help needed climbing 3-5 steps with a railing? : A Little 6 Click Score: 22    End of Session   Activity Tolerance: Patient tolerated treatment well Patient left: in bed;with call bell/phone within reach;with bed alarm set Nurse Communication: Mobility status PT Visit Diagnosis: Pain;Muscle weakness (generalized) (M62.81) Pain - Right/Left: Left Pain - part of body:  (face and mouth)    Time: 1040-1100 PT Time Calculation (min) (ACUTE ONLY): 20 min   Charges:   PT Evaluation $PT Eval Moderate Complexity: 1 Mod   PT General Charges $$ ACUTE PT VISIT: 1 Visit         11:17 AM, 01/14/24 [facility] Yuliya Nova MPT [facility] [place] [phone] Ph:[phone]

## 2024-01-14 NOTE — Hospital Course (Addendum)
 83 year old female with a history of complete heart block status post PPM, hypertension, dyslipidemia, and breast cancer presenting with left facial droop and left-sided weakness.  The patient states that she has been dealing with a dental infection since 01/08/2024 which is when she began having some pain in her left lower molar.  She went to see her dentist on 01/11/2024.  He started her on amoxicillin .  She continued to have pain in her left lower jaw.  She noted some swelling.  She went to urgent care on 01/13/2024.  During her visit, the patient became dizzy and subsequently had a syncopal episode.  Review of the room notes shows that the patient may have had some convulsions.  Upon EMS arrival, they noted the patient had left facial droop and left-sided weakness.  Therefore, code stroke was activated. Prior to the event, the patient had not had any fevers, chills, chest pain, shortness of breath, nausea, vomiting, diarrhea.  She has not been started any new medications. She states that she has been able to chew with some pain in her left jaw.  She denies any neck swelling. In the ED, the patient was afebrile and hemodynamically stable with oxygen  saturation 98% room air.  WBC 10.3, hemoglobin 11.5, platelet 212.  Sodium 137, potassium 3.9, bicarbonate 22, serum creatinine 0.86.  LFTs unremarkable.  CTA head and neck was negative for LVO, and negative for emergent high-grade stenosis proximally.  CT of the brain was negative for any acute findings.  It did show chronic lacunar infarct in the right caudate. Apparently, the patient was admitted for workup of her neurologic symptoms. Regarding her dental infection, the patient states that she has a follow-up appointment with her dentist for her tooth to be removed on 01/15/2024.

## 2024-01-14 NOTE — Plan of Care (Signed)
   Problem: Education: Goal: Knowledge of General Education information will improve Description Including pain rating scale, medication(s)/side effects and non-pharmacologic comfort measures Outcome: Progressing   Problem: Health Behavior/Discharge Planning: Goal: Ability to manage health-related needs will improve Outcome: Progressing

## 2024-01-15 ENCOUNTER — Inpatient Hospital Stay (HOSPITAL_COMMUNITY)

## 2024-01-15 DIAGNOSIS — R42 Dizziness and giddiness: Secondary | ICD-10-CM | POA: Diagnosis not present

## 2024-01-15 DIAGNOSIS — R55 Syncope and collapse: Secondary | ICD-10-CM | POA: Diagnosis not present

## 2024-01-15 DIAGNOSIS — I6782 Cerebral ischemia: Secondary | ICD-10-CM | POA: Diagnosis not present

## 2024-01-15 DIAGNOSIS — M272 Inflammatory conditions of jaws: Secondary | ICD-10-CM | POA: Diagnosis not present

## 2024-01-15 LAB — CBC
HCT: 33.6 % — ABNORMAL LOW (ref 36.0–46.0)
Hemoglobin: 10.9 g/dL — ABNORMAL LOW (ref 12.0–15.0)
MCH: 31.2 pg (ref 26.0–34.0)
MCHC: 32.4 g/dL (ref 30.0–36.0)
MCV: 96.3 fL (ref 80.0–100.0)
Platelets: 201 K/uL (ref 150–400)
RBC: 3.49 MIL/uL — ABNORMAL LOW (ref 3.87–5.11)
RDW: 13.8 % (ref 11.5–15.5)
WBC: 7.4 K/uL (ref 4.0–10.5)
nRBC: 0 % (ref 0.0–0.2)

## 2024-01-15 LAB — BASIC METABOLIC PANEL WITH GFR
Anion gap: 6 (ref 5–15)
BUN: 13 mg/dL (ref 8–23)
CO2: 22 mmol/L (ref 22–32)
Calcium: 8.4 mg/dL — ABNORMAL LOW (ref 8.9–10.3)
Chloride: 111 mmol/L (ref 98–111)
Creatinine, Ser: 0.74 mg/dL (ref 0.44–1.00)
GFR, Estimated: >60 mL/min (ref >60)
Glucose, Bld: 95 mg/dL (ref 70–99)
Potassium: 3.8 mmol/L (ref 3.5–5.1)
Sodium: 139 mmol/L (ref 135–145)

## 2024-01-15 LAB — MAGNESIUM: Magnesium: 2.1 mg/dL (ref 1.7–2.4)

## 2024-01-15 MED ORDER — OXYCODONE HCL 10 MG PO TABS: 10.0000 mg | ORAL_TABLET | ORAL | 0 refills | Status: DC | PRN

## 2024-01-15 MED ORDER — AMOXICILLIN-POT CLAVULANATE 875-125 MG PO TABS: 1.0000 | ORAL_TABLET | Freq: Two times a day (BID) | ORAL | 0 refills | Status: DC

## 2024-01-15 MED ORDER — OXYCODONE HCL 5 MG PO TABS
10.0000 mg | ORAL_TABLET | ORAL | Status: DC | PRN
  Administered 2024-01-15: 10 mg via ORAL
  Filled 2024-01-15: qty 2

## 2024-01-15 MED ORDER — PREDNISONE 20 MG PO TABS
50.0000 mg | ORAL_TABLET | Freq: Once | ORAL | Status: AC
  Administered 2024-01-15: 50 mg via ORAL
  Filled 2024-01-15: qty 1

## 2024-01-15 MED ORDER — ASPIRIN 81 MG PO TBEC: 81.0000 mg | DELAYED_RELEASE_TABLET | Freq: Every day | ORAL | Status: DC

## 2024-01-15 NOTE — Discharge Summary (Addendum)
 Physician Discharge Summary   Patient: Jessica Woodward MRN: [id number] DOB: 1941/03/23  Admit date:     01/13/2024  Discharge date: 01/15/24  Discharge Physician: Alm Shahir Karen   PCP: Joshua Debby CROME, MD   Recommendations at discharge:   Please follow up with primary care provider within 1-2 weeks  Please repeat BMP and CBC in one week    Hospital Course: 83 year old female with a history of complete heart block status post PPM, hypertension, dyslipidemia, and breast cancer presenting with left facial droop and left-sided weakness.  The patient states that she has been dealing with a dental infection since 01/08/2024 which is when she began having some pain in her left lower molar.  She went to see her dentist on 01/11/2024.  He started her on amoxicillin .  She continued to have pain in her left lower jaw.  She noted some swelling.  She went to urgent care on 01/13/2024.  During her visit, the patient became dizzy and subsequently had a syncopal episode.  Review of the room notes shows that the patient may have had some convulsions.  Upon EMS arrival, they noted the patient had left facial droop and left-sided weakness.  Therefore, code stroke was activated. Prior to the event, the patient had not had any fevers, chills, chest pain, shortness of breath, nausea, vomiting, diarrhea.  She has not been started any new medications. She states that she has been able to chew with some pain in her left jaw.  She denies any neck swelling. In the ED, the patient was afebrile and hemodynamically stable with oxygen  saturation 98% room air.  WBC 10.3, hemoglobin 11.5, platelet 212.  Sodium 137, potassium 3.9, bicarbonate 22, serum creatinine 0.86.  LFTs unremarkable.  CTA head and neck was negative for LVO, and negative for emergent high-grade stenosis proximally.  CT of the brain was negative for any acute findings.  It did show chronic lacunar infarct in the right caudate. Apparently, the patient was admitted for  workup of her neurologic symptoms. Regarding her dental infection, the patient states that she has a follow-up appointment with her dentist for her tooth to be removed on 01/15/2024.  Assessment and Plan:  Syncope/transient left hemiparesis - Secondary to vasovagal phenomenon/dysautonomia vs convulsive syncope - The patient was noted to have blood pressure 80 over 50s at the urgent care. - Check orthostatics--neg - Echo--EF 60-65%, no WMA, normal RVF, no PFO - Given IV fluids - CTA head and neck--negative LVO, negative for emergent high-grade stenosis - LDL 58 - Hemoglobin A1c--5.5 -  discussed with neurology, Dr. Theador not need to wait in hospital for MR brain--can be done as outpt as long as pt has no new deficits going forward>>ok to d/c home with ASA 81 mg daily - repeat CT brain prior to d/c - 01/13/24 CTA H&N as discussed above--neg LVO - 01/15/24 CT brain--no acute findings   Dental infection/submandibular/facial cellulitis - Started empiric Zosyn  for now>>WBC improved - 8/22 CT neck soft tissues--appears to be Odontogenic - associated with periapical lucency and dehiscence from the left mandible wisdom tooth. No abscess is identified. - prm dilaudid  for pain - pt has appointment with dentist on 01/16/24 - spoke with patient's dentist, Dr. Nigel appointment for 01/16/24; requested prednisone  be given - prednisone  50 mg po x 1 given - d/c home with amox/clav   Essential hypertension - Temporarily holding ARB to allow for permissive hypertension - BP remains well controlled>>will not restart   Dyslipidemia - Continue aspirin   Complete heart block - Status post permanent pacemaker - Follow-up with Dr. Waddell       Consultants: none Procedures performed: none  Disposition: Home Diet recommendation:  Regular diet DISCHARGE MEDICATION: Allergies as of 01/15/2024       Reactions   Anastrozole Dermatitis, Rash, Other (See Comments)   Muscle weakness    Lisinopril Other (See Comments)   Cough    Evenity  [romosozumab ] Other (See Comments)   Severe muscle spasms   Lactose Intolerance (gi) Diarrhea   Sulfasalazine Hives   Sulfonamide Derivatives Hives        Medication List     STOP taking these medications    amoxicillin  500 MG capsule Commonly known as: AMOXIL    irbesartan  300 MG tablet Commonly known as: AVAPRO        TAKE these medications    acetaminophen  500 MG tablet Commonly known as: TYLENOL  Take 1,000 mg by mouth every 6 (six) hours as needed for mild pain (pain score 1-3).   amoxicillin -clavulanate 875-125 MG tablet Commonly known as: AUGMENTIN  Take 1 tablet by mouth 2 (two) times daily.   aspirin  EC 81 MG tablet Take 1 tablet (81 mg total) by mouth daily. Swallow whole.   atorvastatin  40 MG tablet Commonly known as: LIPITOR TAKE 1 TABLET BY MOUTH DAILY   loratadine 10 MG tablet Commonly known as: CLARITIN Take 10 mg by mouth daily.   multivitamin tablet Take 1 tablet by mouth daily.   Oxycodone  HCl 10 MG Tabs Take 1 tablet (10 mg total) by mouth every 4 (four) hours as needed. What changed:  medication strength how much to take reasons to take this   triamcinolone  cream 0.1 % Commonly known as: KENALOG  Apply 1 application topically 4 (four) times daily as needed (rash).        Discharge Exam: Filed Weights   01/13/24 1922  Weight: 65.8 kg   HEENT:  [place]/AT, No thrush, no icterus;  + edema left submandibular area + cervical LNs CV:  RRR, no rub, no S3, no S4 Lung:  CTA, no wheeze, no rhonchi Abd:  soft/+BS, NT Ext:  No edema, no lymphangitis, no synovitis, no rash   Condition at discharge: stable  The results of significant diagnostics from this hospitalization (including imaging, microbiology, ancillary and laboratory) are listed below for reference.   Imaging Studies: CT HEAD WO CONTRAST ( ) Result Date: 01/15/2024 CLINICAL DATA:  Provided history: Neuro deficit, acute,  stroke suspected. Additional history provided: Dizziness, syncopal episode, convulsions, left-sided facial droop, left-sided weakness. EXAM: CT HEAD WITHOUT CONTRAST TECHNIQUE: Contiguous axial images were obtained from the base of the skull through the vertex without intravenous contrast. RADIATION DOSE REDUCTION: This exam was performed according to the departmental dose-optimization program which includes automated exposure control, adjustment of the mA and/or kV according to patient size and/or use of iterative reconstruction technique. COMPARISON:  Neck CT 01/14/2024. Non-contrast head CT and CT angiogram head/neck 01/13/2024. FINDINGS: Brain: Mild generalized cerebral atrophy. Chronic lacunar infarct again demonstrated within the right caudate head. Patchy and ill-defined hypoattenuation within the cerebral white matter, nonspecific but compatible with mild chronic small vessel ischemic disease. There is no acute intracranial hemorrhage. No demarcated cortical infarct. No extra-axial fluid collection. No evidence of an intracranial mass. No midline shift. Vascular: No hyperdense vessel.  Atherosclerotic calcifications. Skull: No calvarial fracture or aggressive osseous lesion. Sinuses/Orbits: No mass or acute finding within the imaged orbits. No significant paranasal sinus disease trace mucosal thickening within the bilateral ethmoid sinuses. IMPRESSION: 1.  No evidence of an acute intracranial abnormality. 2. Parenchymal atrophy, chronic small vessel ischemic disease and known chronic lacunar infarct within the right caudate nucleus. Electronically Signed   By: Rockey Childs D.O.   On: 01/15/2024 13:53   CT SOFT TISSUE NECK W CONTRAST Result Date: 01/14/2024 CLINICAL DATA:  83 year old female with left side dental infection, neck swelling. EXAM: CT NECK WITH CONTRAST TECHNIQUE: Multidetector CT imaging of the neck was performed using the standard protocol following the bolus administration of intravenous  contrast. RADIATION DOSE REDUCTION: This exam was performed according to the departmental dose-optimization program which includes automated exposure control, adjustment of the mA and/or kV according to patient size and/or use of iterative reconstruction technique. CONTRAST:  75mL OMNIPAQUE  IOHEXOL  300 MG/ML  SOLN COMPARISON:  CTA head and neck yesterday. FINDINGS: Pharynx and larynx: Larynx and pharynx soft tissue contours remain within normal limits. Negative parapharyngeal and retropharyngeal spaces. Salivary glands: Negative sublingual space. Submandibular gland ductal ectasia. Parotid glands remain within normal limits. Thyroid : Negative. Lymph nodes: Asymmetric small reactive appearing left level 1 and level 2 lymph nodes. But no abnormally enlarged or heterogeneous nodes. Superimposed confluent inflammation of the left lower face, thickening of the left platysma continuing inferiorly across the left submandibular space. Subcutaneous edema. Trace edema in the left posterior submandibular space. Left lower masticator space is affected. Upper masticator space appears spared. No soft tissue gas. No organized or drainable fluid collection identified. See dental findings below. No other areas of soft tissue inflammation identified. Vascular: Major vascular structures in the bilateral neck and at the skull base appear patent, stable from the CTA yesterday. Limited intracranial: Stable. Visualized orbits: Negative. Mastoids and visualized paranasal sinuses: Visualized paranasal sinuses and mastoids are clear. Skeleton: Evidence of scattered dental caries. Left face soft tissue inflammation is in proximity to the posterior left mandible dentition, and subtle apical lucency with cortical dehiscence suspected at the left mandible wisdom tooth on series 5, image 67. This is along the medial posterior body of the mandible, and there is surrounding soft tissue inflammation. Advanced cervical spine degeneration. Other  visible osseous structures stable from the CTA yesterday. Upper chest: Left chest pacemaker. Calcified aortic atherosclerosis. Aberrant origin right subclavian artery (normal variant). Azygous lobe and fissure, normal variant. Apical and upper lobe lung scarring. Mild atelectasis. IMPRESSION: 1. Left lower face and submandibular space Cellulitis, appears to be Odontogenic - associated with periapical lucency and dehiscence from the left mandible wisdom tooth. No abscess is identified. Regional reactive lymph nodes. 2.  Aortic Atherosclerosis (ICD10-I70.0). Electronically Signed   By: VEAR Hurst M.D.   On: 01/14/2024 12:19   ECHOCARDIOGRAM COMPLETE Result Date: 01/14/2024    ECHOCARDIOGRAM REPORT   Patient Name:   Jessica Woodward Date of Exam: 01/14/2024 Medical Rec #:  [id number]      Height:       64.0 in Accession #:    [id number]     Weight:       145.1 lb Date of Birth:  1940-06-10      BSA:          1.707 m Patient Age:    83 years       BP:           120/76 mmHg Patient Gender: F              HR:           62 bpm. Exam Location:  [facility] Procedure: 2D Echo, Cardiac Doppler and  Color Doppler (Both Spectral and Color            Flow Doppler were utilized during procedure). Indications:    TIA  History:        Patient has prior history of Echocardiogram examinations, most                 recent 08/04/2020. Risk Factors:Hypertension and Dyslipidemia.  Sonographer:    Philomena Daring Referring Phys: [id number] JACOB J STINSON IMPRESSIONS  1. Left ventricular ejection fraction, by estimation, is 60 to 65%. The left ventricle has normal function. The left ventricle has no regional wall motion abnormalities. Left ventricular diastolic parameters were normal.  2. Right ventricular systolic function is normal. The right ventricular size is normal.  3. The mitral valve is abnormal. Mild mitral valve regurgitation. No evidence of mitral stenosis.  4. The tricuspid valve is abnormal.  5. The aortic valve has an indeterminant number  of cusps. Aortic valve regurgitation is not visualized. No aortic stenosis is present.  6. The inferior vena cava is normal in size with greater than 50% respiratory variability, suggesting right atrial pressure of 3 mmHg. FINDINGS  Left Ventricle: Left ventricular ejection fraction, by estimation, is 60 to 65%. The left ventricle has normal function. The left ventricle has no regional wall motion abnormalities. The left ventricular internal cavity size was normal in size. There is  no left ventricular hypertrophy. Left ventricular diastolic parameters were normal. Right Ventricle: The right ventricular size is normal. Right vetricular wall thickness was not well visualized. Right ventricular systolic function is normal. Left Atrium: Left atrial size was normal in size. Right Atrium: Right atrial size was normal in size. Pericardium: There is no evidence of pericardial effusion. Mitral Valve: The mitral valve is abnormal. Mild mitral valve regurgitation. No evidence of mitral valve stenosis. Tricuspid Valve: The tricuspid valve is abnormal. Tricuspid valve regurgitation is mild . No evidence of tricuspid stenosis. Aortic Valve: The aortic valve has an indeterminant number of cusps. Aortic valve regurgitation is not visualized. No aortic stenosis is present. Aortic valve mean gradient measures 3.1 mmHg. Aortic valve peak gradient measures 6.1 mmHg. Aortic valve area, by VTI measures 2.75 cm. Pulmonic Valve: The pulmonic valve was not well visualized. Pulmonic valve regurgitation is not visualized. No evidence of pulmonic stenosis. Aorta: The aortic root and ascending aorta are structurally normal, with no evidence of dilitation. Venous: The inferior vena cava is normal in size with greater than 50% respiratory variability, suggesting right atrial pressure of 3 mmHg. IAS/Shunts: No atrial level shunt detected by color flow Doppler. Additional Comments: A device lead is visualized in the right atrium and right  ventricle.  LEFT VENTRICLE PLAX 2D LVIDd:         3.40 cm     Diastology LVIDs:         2.20 cm     LV e' medial:    9.03 cm/s LV PW:         1.00 cm     LV E/e' medial:  11.8 LV IVS:        1.00 cm     LV e' lateral:   10.90 cm/s LVOT diam:     2.00 cm     LV E/e' lateral: 9.8 LV SV:         72 LV SV Index:   42 LVOT Area:     3.14 cm  LV Volumes (MOD) LV vol d, MOD A2C: 61.7 ml LV vol d,  MOD A4C: 60.6 ml LV vol s, MOD A2C: 19.8 ml LV vol s, MOD A4C: 18.6 ml LV SV MOD A2C:     41.9 ml LV SV MOD A4C:     60.6 ml LV SV MOD BP:      42.4 ml RIGHT VENTRICLE             IVC RV S prime:     10.00 cm/s  IVC diam: 1.80 cm TAPSE (M-mode): 1.7 cm LEFT ATRIUM             Index        RIGHT ATRIUM           Index LA diam:        2.60 cm 1.52 cm/m   RA Area:     11.00 cm LA Vol (A2C):   15.3 ml 8.96 ml/m   RA Volume:   21.90 ml  12.83 ml/m LA Vol (A4C):   20.3 ml 11.89 ml/m LA Biplane Vol: 19.4 ml 11.37 ml/m  AORTIC VALVE AV Area (Vmax):    2.72 cm AV Area (Vmean):   2.89 cm AV Area (VTI):     2.75 cm AV Vmax:           123.52 cm/s AV Vmean:          81.326 cm/s AV VTI:            0.262 m AV Peak Grad:      6.1 mmHg AV Mean Grad:      3.1 mmHg LVOT Vmax:         107.00 cm/s LVOT Vmean:        74.900 cm/s LVOT VTI:          0.229 m LVOT/AV VTI ratio: 0.87  AORTA Ao Root diam: 2.70 cm Ao Asc diam:  3.00 cm MITRAL VALVE                TRICUSPID VALVE MV Area (PHT): 4.10 cm     TR Peak grad:   26.4 mmHg MV Decel Time: 185 msec     TR Vmax:        257.00 cm/s MR Peak grad: 103.2 mmHg MR Vmax:      508.00 cm/s   SHUNTS MV E velocity: 107.00 cm/s  Systemic VTI:  0.23 m MV A velocity: 121.00 cm/s  Systemic Diam: 2.00 cm MV E/A ratio:  0.88 Dorn Ross MD Electronically signed by Dorn Ross MD Signature Date/Time: 01/14/2024/11:10:02 AM    Final    CT ANGIO HEAD NECK W WO CM Result Date: 01/13/2024 CLINICAL DATA:  Stroke, hemorrhagic EXAM: CT ANGIOGRAPHY HEAD AND NECK WITH AND WITHOUT CONTRAST TECHNIQUE:  Multidetector CT imaging of the head and neck was performed using the standard protocol during bolus administration of intravenous contrast. Multiplanar CT image reconstructions and MIPs were obtained to evaluate the vascular anatomy. Carotid stenosis measurements (when applicable) are obtained utilizing NASCET criteria, using the distal internal carotid diameter as the denominator. RADIATION DOSE REDUCTION: This exam was performed according to the departmental dose-optimization program which includes automated exposure control, adjustment of the mA and/or kV according to patient size and/or use of iterative reconstruction technique. CONTRAST:  OMNIPAQUE  IOHEXOL  350 MG/ML SOLN COMPARISON:  Same day CT head code stroke. FINDINGS: CTA NECK FINDINGS Aortic arch: Average origin of the subclavian artery which courses posterior to the esophagus and trachea. Right carotid system: No evidence of dissection, stenosis (50% or greater), or occlusion. Left carotid  system: No evidence of dissection, stenosis (50% or greater), or occlusion. Vertebral arteries: Left dominant. No evidence of dissection, stenosis (50% or greater), or occlusion. Skeleton: No evidence of acute abnormality on limited assessment. Other neck: No evidence of acute abnormality on limited assessment. Upper chest: Biapical pleuroparenchymal scarring. Otherwise, clear lung apices. Review of the MIP images confirms the above findings CTA HEAD FINDINGS Anterior circulation: Bilateral intracranial ICAs, MCAs, and bilateral ACAs are patent without proximal high-grade stenosis. Posterior circulation: Bilateral intradural vertebral arteries, basilar artery and bilateral posterior cerebral arteries are patent without proximal hemodynamically significant stenosis Venous sinuses: As permitted by contrast timing, patent. Review of the MIP images confirms the above findings IMPRESSION: No emergent large vessel occlusion or proximal high-grade stenosis.  Electronically Signed   By: Gilmore GORMAN Molt M.D.   On: 01/13/2024 20:51   CT HEAD CODE STROKE WO CONTRAST Result Date: 01/13/2024 CLINICAL DATA:  Code stroke. Neuro deficit, acute, stroke suspected. Left-sided weakness. Vision loss. EXAM: CT HEAD WITHOUT CONTRAST TECHNIQUE: Contiguous axial images were obtained from the base of the skull through the vertex without intravenous contrast. RADIATION DOSE REDUCTION: This exam was performed according to the departmental dose-optimization program which includes automated exposure control, adjustment of the mA and/or kV according to patient size and/or use of iterative reconstruction technique. COMPARISON:  Head CT 04/24/2020. FINDINGS: Brain: Mild generalized cerebral atrophy. Chronic lacunar infarct again demonstrated within the right caudate head. Patchy and ill-defined hypoattenuation within the cerebral white matter, nonspecific but compatible with mild chronic small vessel ischemic disease. There is no acute intracranial hemorrhage. No demarcated cortical infarct. No extra-axial fluid collection. No evidence of an intracranial mass. No midline shift. Vascular: No hyperdense vessel.  Atherosclerotic calcifications. Skull: No calvarial fracture or aggressive osseous lesion. Sinuses/Orbits: No mass or acute finding within the imaged orbits. No significant paranasal sinus disease at the imaged levels. ASPECTS ([place]berta Stroke Program Early CT Score) - Ganglionic level infarction (caudate, lentiform nuclei, internal capsule, insula, M1-M3 cortex): 7 - Supraganglionic infarction (M4-M6 cortex): 3 Total score (0-10 with 10 being normal): 10 (when discounting a chronic infarct within the right caudate head). No evidence of an acute intracranial abnormality. These results were called by telephone at the time of interpretation on 01/13/2024 at 6:37 pm to provider [facility] ZAMMIT , who verbally acknowledged these results. IMPRESSION: 1.  No evidence of an acute intracranial  abnormality. 2. Redemonstrated chronic lacunar infarct within the right caudate head. 3. Background parenchymal atrophy and chronic small vessel ischemic disease. Electronically Signed   By: Rockey Childs D.O.   On: 01/13/2024 18:38   CUP PACEART REMOTE DEVICE CHECK Result Date: 12/21/2023 PPM Scheduled remote reviewed. Normal device function.  Presenting rhythm:  AS/VP Next remote 91 days. LA, CVRS   Microbiology: Results for orders placed or performed during the hospital encounter of 12/14/19  SARS Coronavirus 2 by RT PCR (hospital order, performed in [facility] hospital lab) Nasopharyngeal Nasopharyngeal Swab     Status: None   Collection Time: 12/14/19  8:45 PM   Specimen: Nasopharyngeal Swab  Result Value Ref Range Status   SARS Coronavirus 2 NEGATIVE NEGATIVE Final    Comment: (NOTE) SARS-CoV-2 target nucleic acids are NOT DETECTED.  The SARS-CoV-2 RNA is generally detectable in upper and lower respiratory specimens during the acute phase of infection. The lowest concentration of SARS-CoV-2 viral copies this assay can detect is 250 copies / mL. A negative result does not preclude SARS-CoV-2 infection and should not be used as the sole  basis for treatment or other patient management decisions.  A negative result may occur with improper specimen collection / handling, submission of specimen other than nasopharyngeal swab, presence of viral mutation(s) within the areas targeted by this assay, and inadequate number of viral copies (<250 copies / mL). A negative result must be combined with clinical observations, patient history, and epidemiological information.  Fact Sheet for Patients:   BoilerBrush.com.cy  Fact Sheet for Healthcare Providers: https://pope.com/  This test is not yet approved or  cleared by the [place]  FDA and has been authorized for detection and/or diagnosis of SARS-CoV-2 by FDA under an Emergency Use  Authorization (EUA).  This EUA will remain in effect (meaning this test can be used) for the duration of the COVID-19 declaration under Section 564(b)(1) of the Act, 21 U.S.C. section 360bbb-3(b)(1), unless the authorization is terminated or revoked sooner.  Performed at [facility], [address], [place], [place] [address]     Labs: CBC: Recent Labs  Lab 01/13/24 1817 01/13/24 1828 01/15/24 0426  WBC 10.3  --  7.4  NEUTROABS 7.8*  --   --   HGB 11.5* 12.6 10.9*  HCT 34.4* 37.0 33.6*  MCV 96.1  --  96.3  PLT 212  --  201   Basic Metabolic Panel: Recent Labs  Lab 01/13/24 1817 01/13/24 1828 01/15/24 0426  NA 137 137 139  K 3.9 4.0 3.8  CL 103 103 111  CO2 22  --  22  GLUCOSE 144* 144* 95  BUN 21 23 13   CREATININE 0.86 0.90 0.74  CALCIUM  9.6  --  8.4*  MG  --   --  2.1   Liver Function Tests: Recent Labs  Lab 01/13/24 1817  AST 21  ALT 16  ALKPHOS 74  BILITOT 0.8  PROT 6.5  ALBUMIN 3.8   CBG: Recent Labs  Lab 01/13/24 1817  GLUCAP 151*    Discharge time spent: greater than 30 minutes.  Signed: Alm Schneider, MD [facility] 01/15/2024

## 2024-01-15 NOTE — Plan of Care (Signed)
  Problem: Education: Goal: Knowledge of General Education information will improve Description: Including pain rating scale, medication(s)/side effects and non-pharmacologic comfort measures Outcome: Adequate for Discharge   Problem: Health Behavior/Discharge Planning: Goal: Ability to manage health-related needs will improve Outcome: Adequate for Discharge   Problem: Clinical Measurements: Goal: Ability to maintain clinical measurements within normal limits will improve Outcome: Adequate for Discharge Goal: Will remain free from infection Outcome: Adequate for Discharge Goal: Diagnostic test results will improve Outcome: Adequate for Discharge Goal: Respiratory complications will improve Outcome: Adequate for Discharge Goal: Cardiovascular complication will be avoided Outcome: Adequate for Discharge   Problem: Activity: Goal: Risk for activity intolerance will decrease Outcome: Adequate for Discharge   Problem: Nutrition: Goal: Adequate nutrition will be maintained Outcome: Adequate for Discharge   Problem: Coping: Goal: Level of anxiety will decrease Outcome: Adequate for Discharge   Problem: Elimination: Goal: Will not experience complications related to bowel motility Outcome: Adequate for Discharge Goal: Will not experience complications related to urinary retention Outcome: Adequate for Discharge   Problem: Pain Managment: Goal: General experience of comfort will improve and/or be controlled Outcome: Adequate for Discharge   Problem: Safety: Goal: Ability to remain free from injury will improve Outcome: Adequate for Discharge   Problem: Skin Integrity: Goal: Risk for impaired skin integrity will decrease Outcome: Adequate for Discharge   Problem: Education: Goal: Knowledge of disease or condition will improve Outcome: Adequate for Discharge Goal: Knowledge of secondary prevention will improve (MUST DOCUMENT ALL) Outcome: Adequate for Discharge Goal:  Knowledge of patient specific risk factors will improve (DELETE if not current risk factor) Outcome: Adequate for Discharge   Problem: Ischemic Stroke/TIA Tissue Perfusion: Goal: Complications of ischemic stroke/TIA will be minimized Outcome: Adequate for Discharge   Problem: Coping: Goal: Will verbalize positive feelings about self Outcome: Adequate for Discharge Goal: Will identify appropriate support needs Outcome: Adequate for Discharge   Problem: Health Behavior/Discharge Planning: Goal: Ability to manage health-related needs will improve Outcome: Adequate for Discharge Goal: Goals will be collaboratively established with patient/family Outcome: Adequate for Discharge   Problem: Self-Care: Goal: Ability to participate in self-care as condition permits will improve Outcome: Adequate for Discharge Goal: Verbalization of feelings and concerns over difficulty with self-care will improve Outcome: Adequate for Discharge Goal: Ability to communicate needs accurately will improve Outcome: Adequate for Discharge   Problem: Nutrition: Goal: Risk of aspiration will decrease Outcome: Adequate for Discharge Goal: Dietary intake will improve Outcome: Adequate for Discharge

## 2024-01-15 NOTE — Plan of Care (Signed)
  Problem: Education: Goal: Knowledge of General Education information will improve Description: Including pain rating scale, medication(s)/side effects and non-pharmacologic comfort measures Outcome: Progressing   Problem: Health Behavior/Discharge Planning: Goal: Ability to manage health-related needs will improve Outcome: Progressing   Problem: Clinical Measurements: Goal: Ability to maintain clinical measurements within normal limits will improve Outcome: Progressing Goal: Will remain free from infection Outcome: Progressing Goal: Diagnostic test results will improve Outcome: Progressing Goal: Respiratory complications will improve Outcome: Progressing Goal: Cardiovascular complication will be avoided Outcome: Progressing   Problem: Activity: Goal: Risk for activity intolerance will decrease Outcome: Progressing   Problem: Nutrition: Goal: Adequate nutrition will be maintained Outcome: Progressing   Problem: Coping: Goal: Level of anxiety will decrease Outcome: Progressing   Problem: Elimination: Goal: Will not experience complications related to bowel motility Outcome: Progressing Goal: Will not experience complications related to urinary retention Outcome: Progressing   Problem: Pain Managment: Goal: General experience of comfort will improve and/or be controlled Outcome: Progressing   Problem: Safety: Goal: Ability to remain free from injury will improve Outcome: Progressing   Problem: Skin Integrity: Goal: Risk for impaired skin integrity will decrease Outcome: Progressing   Problem: Ischemic Stroke/TIA Tissue Perfusion: Goal: Complications of ischemic stroke/TIA will be minimized Outcome: Progressing   Problem: Coping: Goal: Will verbalize positive feelings about self Outcome: Progressing Goal: Will identify appropriate support needs Outcome: Progressing   Problem: Self-Care: Goal: Ability to participate in self-care as condition permits will  improve Outcome: Progressing Goal: Verbalization of feelings and concerns over difficulty with self-care will improve Outcome: Progressing Goal: Ability to communicate needs accurately will improve Outcome: Progressing   Problem: Nutrition: Goal: Risk of aspiration will decrease Outcome: Progressing Goal: Dietary intake will improve Outcome: Progressing

## 2024-01-15 NOTE — Progress Notes (Addendum)
Discharge instructions, RX's and follow up appts explained and provided to patient verbalized understanding. Patient left floor via wheelchair accompanied by staff no c/o pain or shortness of breath at d/c.  Liem Copenhaver Lynn, RN  

## 2024-01-16 ENCOUNTER — Telehealth: Payer: Self-pay | Admitting: *Deleted

## 2024-01-16 NOTE — Transitions of Care (Post Inpatient/ED Visit) (Signed)
 01/16/2024  Name: Jessica Woodward MRN: [id number] DOB: Feb 28, 1941  Today's TOC FU Call Status: Today's TOC FU Call Status:: Successful TOC FU Call Completed TOC FU Call Complete Date: 01/16/24 Patient's Name and Date of Birth confirmed.  Transition Care Management Follow-up Telephone Call Date of Discharge: 01/15/24 [facility] Type of Discharge: Inpatient Admission Primary Inpatient Discharge Diagnosis:: syncope; possible TIA; dental abscess How have you been since you were released from the hospital?: Better (I had the tooth pulled this morning, and it is such a relief- thats what needed to happen.  I passed out because of the pain- it dropped my blood pressure.  I don't need to see Dr. Joshua and I don't need any calls to check on me- am independent) Any questions or concerns?: No  Items Reviewed: Did you receive and understand the discharge instructions provided?: Yes (thoroughly reviewed with patient who verbalizes good understanding of same) Medications obtained,verified, and reconciled?: Partial Review Completed (Partial medication review completed; confirmed patient obtained/ is taking all newly Rx'd medications as instructed; self-manages medications and denies questions/ concerns around medications today) Reason for Partial Mediation Review: Patient declined full medication review: she is just now returning home from dental extraction and is not up to it; she denies medication concerns/ questions Any new allergies since your discharge?: No Dietary orders reviewed?: Yes Type of Diet Ordered:: Regular- soft food for now after the dental extraction Do you have support at home?: Yes People in Home [RPT]: alone Name of Support/Comfort Primary Source: Reports independent in self-care activities; resides alone: son is temporarily staying with her post-recent hospitalization/ dental extraction-- assists as/ if needed/ indicated  Medications Reviewed  Today: Medications Reviewed Today     Reviewed by Teresha Hanks M, RN (Registered Nurse) on 01/16/24 at 1316  Med List Status: <None>   Medication Order Taking? Sig Documenting Provider Last Dose Status Informant  acetaminophen  (TYLENOL ) 500 MG tablet [id number]  Take 1,000 mg by mouth every 6 (six) hours as needed for mild pain (pain score 1-3). [provider]  Active Self, Pharmacy Records  amoxicillin -clavulanate (AUGMENTIN ) 875-125 MG tablet [id number] Yes Take 1 tablet by mouth 2 (two) times daily. Evonnie Lenis, MD  Active   aspirin  EC 81 MG tablet [id number]  Take 1 tablet (81 mg total) by mouth daily. Swallow whole. Evonnie Lenis, MD  Active   atorvastatin  (LIPITOR) 40 MG tablet [id number]  TAKE 1 TABLET BY MOUTH DAILY Joshua Debby CROME, MD  Active Self, Pharmacy Records  denosumab  (PROLIA ) injection 60 mg [id number]   Joshua Debby CROME, MD  Active            Med Note DRENA CHUCK MATSU   Sat Jan 14, 2024 12:12 PM) Due for next injection in September per pt  loratadine (CLARITIN) 10 MG tablet [id number]  Take 10 mg by mouth daily. [provider]  Active Self, Pharmacy Records  Multiple Vitamin (MULTIVITAMIN) tablet [id number]  Take 1 tablet by mouth daily. [provider]  Active Self, Pharmacy Records  Oxycodone  HCl 10 MG TABS [id number]  Take 1 tablet (10 mg total) by mouth every 4 (four) hours as needed. Evonnie Lenis, MD  Active   Romosozumab -aqqg (EVENITY ) 105 MG/1. injection 210 mg [id number]   Joshua Debby CROME, MD  Active            Med Note DRENA CHUCK MATSU   Sat Jan 14, 2024 12:12 PM) No longer taking, had severe muscle spasms from medication  triamcinolone   cream (KENALOG ) 0.1 % [id number]  Apply 1 application topically 4 (four) times daily as needed (rash).  [provider]  Active Self, Pharmacy Records           Home Care and Equipment/Supplies: Were Home Health Services Ordered?: No Any new equipment or medical supplies ordered?: No  Functional  Questionnaire: Do you need assistance with bathing/showering or dressing?: No Do you need assistance with meal preparation?: No Do you need assistance with eating?: No Do you have difficulty maintaining continence: No Do you need assistance with getting out of bed/getting out of a chair/moving?: No Do you have difficulty managing or taking your medications?: No  Follow up appointments reviewed: PCP Follow-up appointment confirmed?: No (Patient declines offer to assist in scheduling hospital follow up office visit: reports I have an appointment in September, I am not going before then) MD Provider Line [phone] Given: No (verified well-established with current PCP) Specialist Hospital Follow-up appointment confirmed?: Yes Date of Specialist follow-up appointment?: 01/16/24 Follow-Up Specialty Provider:: Dental provider: confirmed attended appointment earlier today for dental extraction Do you need transportation to your follow-up appointment?: No Do you understand care options if your condition(s) worsen?: Yes-patient verbalized understanding  SDOH Interventions Today    Flowsheet Row Most Recent Value  SDOH Interventions   Food Insecurity Interventions Intervention Not Indicated  Housing Interventions Intervention Not Indicated  Transportation Interventions Intervention Not Indicated  [drives self at baseline]  Utilities Interventions Intervention Not Indicated   See TOC assessment tabs for additional assessment/ TOC intervention information  Patient declines need for ongoing/ further care management/ coordination outreach; declines enrollment in 30-day TOC program- declines taking my direct phone number should needs/ concerns arise post-TOC call   Pls call/ message for questions,  Khyler Urda Mckinney Jazzlyn Huizenga, RN, BSN, Media planner  Transitions of Care  [facility]  [place] [phone]: direct office

## 2024-01-26 ENCOUNTER — Ambulatory Visit: Attending: Internal Medicine | Admitting: Internal Medicine

## 2024-01-26 ENCOUNTER — Encounter: Payer: Self-pay | Admitting: Internal Medicine

## 2024-01-26 VITALS — BP 107/65 | HR 73 | Ht 64.0 in | Wt 145.0 lb

## 2024-01-26 DIAGNOSIS — I442 Atrioventricular block, complete: Secondary | ICD-10-CM | POA: Diagnosis not present

## 2024-01-26 LAB — CUP PACEART INCLINIC DEVICE CHECK
Date Time Interrogation Session: 20250904101631
Implantable Lead Connection Status: 753985
Implantable Lead Connection Status: 753985
Implantable Lead Implant Date: 20210720
Implantable Lead Implant Date: 20210720
Implantable Lead Location: 753859
Implantable Lead Location: 753860
Implantable Lead Model: 3830
Implantable Lead Model: 5076
Implantable Pulse Generator Implant Date: 20210720

## 2024-01-26 NOTE — Progress Notes (Signed)
 HPI [name] returns today for followup. She is a pleasant 83 yo woman with CHB, s/p PPM insertion. She has a h/o right sided chest pain when I saw her last year but none recently. She has not had syncope. No edema. She denies any difficulty with exertion. She is able to walk up her stairs and outside without limit.  Allergies  Allergen Reactions   Anastrozole Dermatitis, Rash and Other (See Comments)    Muscle weakness   Lisinopril Other (See Comments)    Cough    Evenity  [Romosozumab ] Other (See Comments)    Severe muscle spasms   Lactose Intolerance (Gi) Diarrhea   Sulfasalazine Hives   Sulfonamide Derivatives Hives     Current Outpatient Medications  Medication Sig Dispense Refill   acetaminophen  (TYLENOL ) 500 MG tablet Take 1,000 mg by mouth every 6 (six) hours as needed for mild pain (pain score 1-3).     aspirin  EC 81 MG tablet Take 1 tablet (81 mg total) by mouth daily. Swallow whole.     atorvastatin  (LIPITOR) 40 MG tablet TAKE 1 TABLET BY MOUTH DAILY 90 tablet 3   loratadine (CLARITIN) 10 MG tablet Take 10 mg by mouth daily.     Multiple Vitamin (MULTIVITAMIN) tablet Take 1 tablet by mouth daily.     triamcinolone  cream (KENALOG ) 0.1 % Apply 1 application topically 4 (four) times daily as needed (rash).      amoxicillin -clavulanate (AUGMENTIN ) 875-125 MG tablet Take 1 tablet by mouth 2 (two) times daily. 14 tablet 0   Oxycodone  HCl 10 MG TABS Take 1 tablet (10 mg total) by mouth every 4 (four) hours as needed. 20 tablet 0   Current Facility-Administered Medications  Medication Dose Route Frequency Provider Last Rate Last Admin   denosumab  (PROLIA ) injection 60 mg  60 mg Subcutaneous Once        Romosozumab -aqqg (EVENITY ) 105 MG/1. injection 210 mg  210 mg Subcutaneous Once Joshua Debby CROME, MD         Past Medical History:  Diagnosis Date   Allergy    Arthritis    Breast cancer (HCC)    Breast disorder    cancer   CARCINOMA, SQUAMOUS CELL, HX OF  06/05/2007   Cataract    Chronic kidney disease    DUCTAL CARCINOMA IN SITU, LEFT BREAST 2002   s/p left lumpectomy, xrt and tamoxifen   History of chemotherapy    Hyperlipemia    Hypertension    Personal history of chemotherapy 2002   Personal history of radiation therapy 2002   RADIATION THERAPY, HX OF    Seasonal allergies    Skin cancer     ROS:   All systems reviewed and negative except as noted in the HPI.   Past Surgical History:  Procedure Laterality Date   ABDOMINAL HYSTERECTOMY     athroscopy right knee  2013   torn miniscus   BREAST BIOPSY Left 2002   BREAST EXCISIONAL BIOPSY Right    BREAST LUMPECTOMY Left 2002   had chemo and radiation   BREAST SURGERY     Left breast lumpectomy with axillary sential node   DILATION AND CURETTAGE OF UTERUS     Left knee surgery     Dr Prentice Collet   MOHS SURGERY     (R) arm   PACEMAKER IMPLANT N/A 12/11/2019   Procedure: PACEMAKER IMPLANT;  Surgeon: Waddell Danelle ORN, MD;  Location: [facility];  Service: Cardiovascular;  Laterality:  N/A;   right breast lupectomy     TONSILLECTOMY AND ADENOIDECTOMY     TUBAL LIGATION       Family History  Problem Relation Age of Onset   Coronary artery disease Mother    Dementia Mother        Died at age 32   Thyroid  disease Mother        hypothyroid   Macular degeneration Father    Coronary artery disease Father    Diabetes Father    Heart disease Father    Cancer Brother        pancreatic   Heart disease Maternal Grandmother    Kidney disease Maternal Grandmother    Heart disease Maternal Grandfather    Heart disease Paternal Grandmother    Diabetes Paternal Grandmother    Heart disease Paternal Grandfather    Hypertension Son    Heart attack Son    Hypertension Son      Social History   Socioeconomic History   Marital status: Widowed    Spouse name: Not on file   Number of children: 2   Years of education: Not on file   Highest education level:  Associate degree: occupational, Scientist, product/process development, or vocational program  Occupational History   Occupation: retired  Tobacco Use   Smoking status: Former    Current packs/day: 0.00    Average packs/day: 0.3 packs/day for 2.0 years (0.5 ttl pk-yrs)    Types: Cigarettes    Quit date: 12/28/1963    Years since quitting: 60.1   Smokeless tobacco: Never  Vaping Use   Vaping status: Never Used  Substance and Sexual Activity   Alcohol use: Yes    Comment: rare glass of wine   Drug use: No   Sexual activity: Not Currently    Partners: Male    Birth control/protection: Surgical    Comment: hyst  Other Topics Concern   Not on file  Social History Narrative   HSG, [employer]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 married '60. 2 boys - '62, '73; 11 grandchildren; 3 great-grandchildren. Retired.   Social Drivers of Corporate investment banker Strain: Low Risk  (07/05/2023)   Overall Financial Resource Strain (CARDIA)    Difficulty of Paying Living Expenses: Not hard at all  Food Insecurity: No Food Insecurity (01/16/2024)   Hunger Vital Sign    Worried About Running Out of Food in the Last Year: Never true    Ran Out of Food in the Last Year: Never true  Transportation Needs: No Transportation Needs (01/16/2024)   PRAPARE - Administrator, Civil Service (Medical): No    Lack of Transportation (Non-Medical): No  Physical Activity: Sufficiently Active (07/05/2023)   Exercise Vital Sign    Days of Exercise per Week: 5 days    Minutes of Exercise per Session: 60 min  Stress: No Stress Concern Present (07/05/2023)   [employer] of Occupational Health - Occupational Stress Questionnaire    Feeling of Stress : Not at all  Social Connections: Moderately Integrated (01/14/2024)   Social Connection and Isolation Panel    Frequency of Communication with Friends and Family: More than three times a week    Frequency of Social Gatherings with Friends and Family: More than three times a week     Attends Religious Services: More than 4 times per year    Active Member of [employer] or Organizations: Yes    Attends Banker Meetings: More than 4 times per  year    Marital Status: Widowed  Intimate Partner Violence: Not At Risk (01/16/2024)   Humiliation, Afraid, Rape, and Kick questionnaire    Fear of Current or Ex-Partner: No    Emotionally Abused: No    Physically Abused: No    Sexually Abused: No     There were no vitals taken for this visit.  Physical Exam:  Well appearing NAD HEENT: Unremarkable Neck:  No JVD, no thyromegally Lymphatics:  No adenopathy Back:  No CVA tenderness Lungs:  Clear HEART:  Regular rate rhythm, no murmurs, no rubs, no clicks Abd:  soft, positive bowel sounds, no organomegally, no rebound, no guarding Ext:  2 plus pulses, no edema, no cyanosis, no clubbing Skin:  No rashes no nodules Neuro:  CN II through XII intact, motor grossly intact  EKG  DEVICE  Normal device function.  See PaceArt for details.   Assess/Plan:  CHB - she has no escape today. She is asymptomatic s/p PPM insertion. PPM - her medtronic DDD PM is working normally. We will recheck in several months. HTN - her bp is well controlled. No change in her meds. Dyslipidemia - she will continue her lipitor 40 mg daily   Zaul Hubers,MD

## 2024-01-26 NOTE — Patient Instructions (Signed)
 Medication Instructions:  Your physician recommends that you continue on your current medications as directed. Please refer to the Current Medication list given to you today.  *If you need a refill on your cardiac medications before your next appointment, please call your pharmacy*  Lab Work: None ordered.  You may go to any [facility] Location for your lab work:  [facility] - [address] ([facility]) - [address] [phone] [address]  [place] - [address]   [place]  - [address]   [place] - [address] - [address] [facility] [facility]  - [address] - [address] ([facility]   If you have labs (blood work) drawn today and your tests are completely normal, you will receive your results only by: [employer] (if you have MyChart)  If you have any lab test that is abnormal or we need to change your treatment, we will call you or send a MyChart message to review the results.  Testing/Procedures: None ordered.  Follow-Up: At [facility], you and your health needs are our priority.  As part of our continuing mission to provide you with exceptional heart care, we have created designated Provider Care Teams.  These Care Teams include your primary Cardiologist (physician) and Advanced Practice Providers (APPs -  Physician Assistants and Nurse Practitioners) who all work together to provide you with the care you need, when you need it.  We recommend signing up for the patient portal called MyChart.  Sign up information is provided on this After Visit Summary.  MyChart is used to connect with patients for Virtual Visits (Telemedicine).  Patients are able to view lab/test results, encounter notes, upcoming appointments, etc.  Non-urgent messages can be sent to your provider as well.   To learn more about what you can do with MyChart, go to  [email or web address].    Your next appointment:   Let us  know if you need to see us  before your move.

## 2024-01-31 ENCOUNTER — Ambulatory Visit: Admitting: Internal Medicine

## 2024-01-31 ENCOUNTER — Encounter: Payer: Self-pay | Admitting: Internal Medicine

## 2024-01-31 VITALS — BP 134/78 | HR 71 | Temp 98.4°F | Resp 16 | Ht 64.0 in | Wt 149.0 lb

## 2024-01-31 DIAGNOSIS — E785 Hyperlipidemia, unspecified: Secondary | ICD-10-CM | POA: Diagnosis not present

## 2024-01-31 DIAGNOSIS — I10 Essential (primary) hypertension: Secondary | ICD-10-CM | POA: Diagnosis not present

## 2024-01-31 DIAGNOSIS — D539 Nutritional anemia, unspecified: Secondary | ICD-10-CM | POA: Diagnosis not present

## 2024-01-31 DIAGNOSIS — Z23 Encounter for immunization: Secondary | ICD-10-CM

## 2024-01-31 NOTE — Progress Notes (Unsigned)
 Subjective:  Patient ID: Jessica Woodward, female    DOB: 1940-12-13  Age: 82 y.o. MRN: [id number]  CC: Medical Management of Chronic Issues (6 month follow up. Patient states that she's moving to [place]. ) and Hypertension   HPI Jessica Woodward presents for f/up ---  Discussed the use of AI scribe software for clinical note transcription with the patient, who gave verbal consent to proceed.  History of Present Illness Jessica Woodward is an 83 year old female who presents with complications following a recent dental infection.  She had a tooth extraction on Monday, which led to severe pain by Thursday. She visited the ER and was prescribed oxycodone , which did not alleviate her pain. By Friday, her face began to swell, and she sought care at an urgent care facility where she passed out. She was subsequently taken to the hospital under the suspicion of a stroke and was admitted for two days. During her hospital stay, she received antibiotics and pain medications. Multiple CT scans and heart monitoring were performed; the patient reports she was told she did not have a stroke or heart issues. The infection has since cleared up, although she describes the pain as the worst she has ever experienced. Her mouth remains tender, and she is being cautious with it.  She is currently taking irbesartan  300 mg for blood pressure management. Her blood pressure medication was temporarily stopped in the hospital, but she has since resumed it. She was also given aspirin  in the hospital, which she has stopped taking.  She is planning to move to [place] to be closer to her son, who is a Veterinary surgeon there. She does not have family in [place] and is looking for a new primary care doctor in [place].  No current symptoms of thyroid  disease and no issues with her heart during her recent hospital stay.     Outpatient Medications Prior to Visit  Medication Sig Dispense Refill   acetaminophen  (TYLENOL ) 500 MG tablet  Take 1,000 mg by mouth every 6 (six) hours as needed for mild pain (pain score 1-3).     atorvastatin  (LIPITOR) 40 MG tablet TAKE 1 TABLET BY MOUTH DAILY 90 tablet 3   irbesartan  (AVAPRO ) 300 MG tablet Take 300 mg by mouth daily.     loratadine (CLARITIN) 10 MG tablet Take 10 mg by mouth daily.     Multiple Vitamin (MULTIVITAMIN) tablet Take 1 tablet by mouth daily.     triamcinolone  cream (KENALOG ) 0.1 % Apply 1 application topically 4 (four) times daily as needed (rash).      aspirin  EC 81 MG tablet Take 1 tablet (81 mg total) by mouth daily. Swallow whole.     Facility-Administered Medications Prior to Visit  Medication Dose Route Frequency Provider Last Rate Last Admin   denosumab  (PROLIA ) injection 60 mg  60 mg Subcutaneous Once        Romosozumab -aqqg (EVENITY ) 105 MG/1. injection 210 mg  210 mg Subcutaneous Once Joshua Debby CROME, MD        ROS Review of Systems  Constitutional:  Negative for appetite change, chills, diaphoresis, fatigue and fever.  HENT: Negative.  Negative for facial swelling and trouble swallowing.   Eyes: Negative.   Respiratory: Negative.  Negative for cough, chest tightness, shortness of breath and wheezing.   Cardiovascular:  Negative for chest pain, palpitations and leg swelling.  Gastrointestinal: Negative.  Negative for abdominal pain, constipation, diarrhea, nausea and vomiting.  Genitourinary: Negative.  Negative for  difficulty urinating and dysuria.  Musculoskeletal: Negative.  Negative for arthralgias and myalgias.  Skin: Negative.   Neurological:  Negative for dizziness, weakness and light-headedness.  Hematological:  Negative for adenopathy. Does not bruise/bleed easily.  Psychiatric/Behavioral: Negative.      Objective:  BP 130/74 (BP Location: Left Arm, Patient Position: Sitting, Cuff Size: Normal)   Pulse 71   Temp 98.4 F (36.9 C) (Oral)   Resp 16   Ht 5' 4 (1.626 m)   Wt 149 lb (67.6 kg)   SpO2 98%   BMI 25.58 kg/m   BP  Readings from Last 3 Encounters:  01/31/24 130/74  01/26/24 107/65  01/15/24 111/60    Wt Readings from Last 3 Encounters:  01/31/24 149 lb (67.6 kg)  01/26/24 145 lb (65.8 kg)  01/13/24 145 lb 1 oz (65.8 kg)    Physical Exam Vitals reviewed.  Constitutional:      Appearance: Normal appearance.  HENT:     Mouth/Throat:     Mouth: Mucous membranes are moist.  Eyes:     General: No scleral icterus.    Conjunctiva/sclera: Conjunctivae normal.  Cardiovascular:     Rate and Rhythm: Normal rate and regular rhythm.     Heart sounds: No murmur heard.    No friction rub. No gallop.  Pulmonary:     Effort: Pulmonary effort is normal.     Breath sounds: No stridor. No wheezing, rhonchi or rales.  Abdominal:     General: Abdomen is flat.     Palpations: There is no mass.     Tenderness: There is no abdominal tenderness. There is no guarding.     Hernia: No hernia is present.  Musculoskeletal:        General: Normal range of motion.     Cervical back: Neck supple.     Right lower leg: No edema.     Left lower leg: No edema.  Lymphadenopathy:     Cervical: No cervical adenopathy.  Skin:    General: Skin is warm and dry.  Neurological:     General: No focal deficit present.     Mental Status: She is alert.  Psychiatric:        Mood and Affect: Mood normal.        Behavior: Behavior normal.     Lab Results  Component Value Date   WBC 7.4 01/15/2024   HGB 10.9 (L) 01/15/2024   HCT 33.6 (L) 01/15/2024   PLT 201 01/15/2024   GLUCOSE 95 01/15/2024   CHOL 124 01/14/2024   TRIG 44 01/14/2024   HDL 57 01/14/2024   LDLDIRECT 166.9 03/23/2013   LDLCALC 58 01/14/2024   ALT 16 01/13/2024   AST 21 01/13/2024   NA 139 01/15/2024   K 3.8 01/15/2024   CL 111 01/15/2024   CREATININE 0.74 01/15/2024   BUN 13 01/15/2024   CO2 22 01/15/2024   TSH 3.52 08/08/2023   INR 1.0 01/13/2024   HGBA1C 5.5 01/13/2024    CT SOFT TISSUE NECK W CONTRAST Result Date: 01/14/2024 CLINICAL  DATA:  83 year old female with left side dental infection, neck swelling. EXAM: CT NECK WITH CONTRAST TECHNIQUE: Multidetector CT imaging of the neck was performed using the standard protocol following the bolus administration of intravenous contrast. RADIATION DOSE REDUCTION: This exam was performed according to the departmental dose-optimization program which includes automated exposure control, adjustment of the mA and/or kV according to patient size and/or use of iterative reconstruction technique. CONTRAST:  75mL OMNIPAQUE   IOHEXOL  300 MG/ML  SOLN COMPARISON:  CTA head and neck yesterday. FINDINGS: Pharynx and larynx: Larynx and pharynx soft tissue contours remain within normal limits. Negative parapharyngeal and retropharyngeal spaces. Salivary glands: Negative sublingual space. Submandibular gland ductal ectasia. Parotid glands remain within normal limits. Thyroid : Negative. Lymph nodes: Asymmetric small reactive appearing left level 1 and level 2 lymph nodes. But no abnormally enlarged or heterogeneous nodes. Superimposed confluent inflammation of the left lower face, thickening of the left platysma continuing inferiorly across the left submandibular space. Subcutaneous edema. Trace edema in the left posterior submandibular space. Left lower masticator space is affected. Upper masticator space appears spared. No soft tissue gas. No organized or drainable fluid collection identified. See dental findings below. No other areas of soft tissue inflammation identified. Vascular: Major vascular structures in the bilateral neck and at the skull base appear patent, stable from the CTA yesterday. Limited intracranial: Stable. Visualized orbits: Negative. Mastoids and visualized paranasal sinuses: Visualized paranasal sinuses and mastoids are clear. Skeleton: Evidence of scattered dental caries. Left face soft tissue inflammation is in proximity to the posterior left mandible dentition, and subtle apical lucency with  cortical dehiscence suspected at the left mandible wisdom tooth on series 5, image 67. This is along the medial posterior body of the mandible, and there is surrounding soft tissue inflammation. Advanced cervical spine degeneration. Other visible osseous structures stable from the CTA yesterday. Upper chest: Left chest pacemaker. Calcified aortic atherosclerosis. Aberrant origin right subclavian artery (normal variant). Azygous lobe and fissure, normal variant. Apical and upper lobe lung scarring. Mild atelectasis. IMPRESSION: 1. Left lower face and submandibular space Cellulitis, appears to be Odontogenic - associated with periapical lucency and dehiscence from the left mandible wisdom tooth. No abscess is identified. Regional reactive lymph nodes. 2.  Aortic Atherosclerosis (ICD10-I70.0). Electronically Signed   By: VEAR Hurst M.D.   On: 01/14/2024 12:19   ECHOCARDIOGRAM COMPLETE Result Date: 01/14/2024    ECHOCARDIOGRAM REPORT   Patient Name:   Jessica Woodward Date of Exam: 01/14/2024 Medical Rec #:  [id number]      Height:       64.0 in Accession #:    [id number]     Weight:       145.1 lb Date of Birth:  [birth year]      BSA:          1.707 m Patient Age:    83 years       BP:           120/76 mmHg Patient Gender: F              HR:           62 bpm. Exam Location:  [facility] Procedure: 2D Echo, Cardiac Doppler and Color Doppler (Both Spectral and Color            Flow Doppler were utilized during procedure). Indications:    TIA  History:        Patient has prior history of Echocardiogram examinations, most                 recent 08/04/2020. Risk Factors:Hypertension and Dyslipidemia.  Sonographer:    Philomena Daring Referring Phys: [id number] JACOB J STINSON IMPRESSIONS  1. Left ventricular ejection fraction, by estimation, is 60 to 65%. The left ventricle has normal function. The left ventricle has no regional wall motion abnormalities. Left ventricular diastolic parameters were normal.  2. Right ventricular systolic  function is normal. The right ventricular  size is normal.  3. The mitral valve is abnormal. Mild mitral valve regurgitation. No evidence of mitral stenosis.  4. The tricuspid valve is abnormal.  5. The aortic valve has an indeterminant number of cusps. Aortic valve regurgitation is not visualized. No aortic stenosis is present.  6. The inferior vena cava is normal in size with greater than 50% respiratory variability, suggesting right atrial pressure of 3 mmHg. FINDINGS  Left Ventricle: Left ventricular ejection fraction, by estimation, is 60 to 65%. The left ventricle has normal function. The left ventricle has no regional wall motion abnormalities. The left ventricular internal cavity size was normal in size. There is  no left ventricular hypertrophy. Left ventricular diastolic parameters were normal. Right Ventricle: The right ventricular size is normal. Right vetricular wall thickness was not well visualized. Right ventricular systolic function is normal. Left Atrium: Left atrial size was normal in size. Right Atrium: Right atrial size was normal in size. Pericardium: There is no evidence of pericardial effusion. Mitral Valve: The mitral valve is abnormal. Mild mitral valve regurgitation. No evidence of mitral valve stenosis. Tricuspid Valve: The tricuspid valve is abnormal. Tricuspid valve regurgitation is mild . No evidence of tricuspid stenosis. Aortic Valve: The aortic valve has an indeterminant number of cusps. Aortic valve regurgitation is not visualized. No aortic stenosis is present. Aortic valve mean gradient measures 3.1 mmHg. Aortic valve peak gradient measures 6.1 mmHg. Aortic valve area, by VTI measures 2.75 cm. Pulmonic Valve: The pulmonic valve was not well visualized. Pulmonic valve regurgitation is not visualized. No evidence of pulmonic stenosis. Aorta: The aortic root and ascending aorta are structurally normal, with no evidence of dilitation. Venous: The inferior vena cava is normal in size  with greater than 50% respiratory variability, suggesting right atrial pressure of 3 mmHg. IAS/Shunts: No atrial level shunt detected by color flow Doppler. Additional Comments: A device lead is visualized in the right atrium and right ventricle.  LEFT VENTRICLE PLAX 2D LVIDd:         3.40 cm     Diastology LVIDs:         2.20 cm     LV e' medial:    9.03 cm/s LV PW:         1.00 cm     LV E/e' medial:  11.8 LV IVS:        1.00 cm     LV e' lateral:   10.90 cm/s LVOT diam:     2.00 cm     LV E/e' lateral: 9.8 LV SV:         72 LV SV Index:   42 LVOT Area:     3.14 cm  LV Volumes (MOD) LV vol d, MOD A2C: 61.7 ml LV vol d, MOD A4C: 60.6 ml LV vol s, MOD A2C: 19.8 ml LV vol s, MOD A4C: 18.6 ml LV SV MOD A2C:     41.9 ml LV SV MOD A4C:     60.6 ml LV SV MOD BP:      42.4 ml RIGHT VENTRICLE             IVC RV S prime:     10.00 cm/s  IVC diam: 1.80 cm TAPSE (M-mode): 1.7 cm LEFT ATRIUM             Index        RIGHT ATRIUM           Index LA diam:  2.60 cm 1.52 cm/m   RA Area:     11.00 cm LA Vol (A2C):   15.3 ml 8.96 ml/m   RA Volume:   21.90 ml  12.83 ml/m LA Vol (A4C):   20.3 ml 11.89 ml/m LA Biplane Vol: 19.4 ml 11.37 ml/m  AORTIC VALVE AV Area (Vmax):    2.72 cm AV Area (Vmean):   2.89 cm AV Area (VTI):     2.75 cm AV Vmax:           123.52 cm/s AV Vmean:          81.326 cm/s AV VTI:            0.262 m AV Peak Grad:      6.1 mmHg AV Mean Grad:      3.1 mmHg LVOT Vmax:         107.00 cm/s LVOT Vmean:        74.900 cm/s LVOT VTI:          0.229 m LVOT/AV VTI ratio: 0.87  AORTA Ao Root diam: 2.70 cm Ao Asc diam:  3.00 cm MITRAL VALVE                TRICUSPID VALVE MV Area (PHT): 4.10 cm     TR Peak grad:   26.4 mmHg MV Decel Time: 185 msec     TR Vmax:        257.00 cm/s MR Peak grad: 103.2 mmHg MR Vmax:      508.00 cm/s   SHUNTS MV E velocity: 107.00 cm/s  Systemic VTI:  0.23 m MV A velocity: 121.00 cm/s  Systemic Diam: 2.00 cm MV E/A ratio:  0.88 Dorn Ross MD Electronically signed by Dorn Ross MD Signature Date/Time: 01/14/2024/11:10:02 AM    Final    CT ANGIO HEAD NECK W WO CM Result Date: 01/13/2024 CLINICAL DATA:  Stroke, hemorrhagic EXAM: CT ANGIOGRAPHY HEAD AND NECK WITH AND WITHOUT CONTRAST TECHNIQUE: Multidetector CT imaging of the head and neck was performed using the standard protocol during bolus administration of intravenous contrast. Multiplanar CT image reconstructions and MIPs were obtained to evaluate the vascular anatomy. Carotid stenosis measurements (when applicable) are obtained utilizing NASCET criteria, using the distal internal carotid diameter as the denominator. RADIATION DOSE REDUCTION: This exam was performed according to the departmental dose-optimization program which includes automated exposure control, adjustment of the mA and/or kV according to patient size and/or use of iterative reconstruction technique. CONTRAST:  OMNIPAQUE  IOHEXOL  350 MG/ML SOLN COMPARISON:  Same day CT head code stroke. FINDINGS: CTA NECK FINDINGS Aortic arch: Average origin of the subclavian artery which courses posterior to the esophagus and trachea. Right carotid system: No evidence of dissection, stenosis (50% or greater), or occlusion. Left carotid system: No evidence of dissection, stenosis (50% or greater), or occlusion. Vertebral arteries: Left dominant. No evidence of dissection, stenosis (50% or greater), or occlusion. Skeleton: No evidence of acute abnormality on limited assessment. Other neck: No evidence of acute abnormality on limited assessment. Upper chest: Biapical pleuroparenchymal scarring. Otherwise, clear lung apices. Review of the MIP images confirms the above findings CTA HEAD FINDINGS Anterior circulation: Bilateral intracranial ICAs, MCAs, and bilateral ACAs are patent without proximal high-grade stenosis. Posterior circulation: Bilateral intradural vertebral arteries, basilar artery and bilateral posterior cerebral arteries are patent without proximal  hemodynamically significant stenosis Venous sinuses: As permitted by contrast timing, patent. Review of the MIP images confirms the above findings IMPRESSION: No emergent large vessel occlusion or proximal  high-grade stenosis. Electronically Signed   By: Gilmore GORMAN Molt M.D.   On: 01/13/2024 20:51   CT HEAD CODE STROKE WO CONTRAST Result Date: 01/13/2024 CLINICAL DATA:  Code stroke. Neuro deficit, acute, stroke suspected. Left-sided weakness. Vision loss. EXAM: CT HEAD WITHOUT CONTRAST TECHNIQUE: Contiguous axial images were obtained from the base of the skull through the vertex without intravenous contrast. RADIATION DOSE REDUCTION: This exam was performed according to the departmental dose-optimization program which includes automated exposure control, adjustment of the mA and/or kV according to patient size and/or use of iterative reconstruction technique. COMPARISON:  Head CT 04/24/2020. FINDINGS: Brain: Mild generalized cerebral atrophy. Chronic lacunar infarct again demonstrated within the right caudate head. Patchy and ill-defined hypoattenuation within the cerebral white matter, nonspecific but compatible with mild chronic small vessel ischemic disease. There is no acute intracranial hemorrhage. No demarcated cortical infarct. No extra-axial fluid collection. No evidence of an intracranial mass. No midline shift. Vascular: No hyperdense vessel.  Atherosclerotic calcifications. Skull: No calvarial fracture or aggressive osseous lesion. Sinuses/Orbits: No mass or acute finding within the imaged orbits. No significant paranasal sinus disease at the imaged levels. ASPECTS ([place]berta Stroke Program Early CT Score) - Ganglionic level infarction (caudate, lentiform nuclei, internal capsule, insula, M1-M3 cortex): 7 - Supraganglionic infarction (M4-M6 cortex): 3 Total score (0-10 with 10 being normal): 10 (when discounting a chronic infarct within the right caudate head). No evidence of an acute intracranial  abnormality. These results were called by telephone at the time of interpretation on 01/13/2024 at 6:37 pm to provider [facility] , who verbally acknowledged these results. IMPRESSION: 1.  No evidence of an acute intracranial abnormality. 2. Redemonstrated chronic lacunar infarct within the right caudate head. 3. Background parenchymal atrophy and chronic small vessel ischemic disease. Electronically Signed   By: Rockey Childs D.O.   On: 01/13/2024 18:38    Assessment & Plan:  Need for immunization against influenza -     Flu vaccine HIGH DOSE PF(Fluzone Trivalent)  Essential hypertension, benign- Her BP is well controlled.  Dyslipidemia, goal LDL below 130  Deficiency anemia- Will evaluate for IDA. -     CBC with Differential/Platelet; Future -     IBC + Ferritin; Future     Follow-up: Return in about 6 months (around 07/30/2024).  Debby Molt, MD

## 2024-01-31 NOTE — Patient Instructions (Signed)
 Hypertension, Adult High blood pressure (hypertension) is when the force of blood pumping through the arteries is too strong. The arteries are the blood vessels that carry blood from the heart throughout the body. Hypertension forces the heart to work harder to pump blood and may cause arteries to become narrow or stiff. Untreated or uncontrolled hypertension can lead to a heart attack, heart failure, a stroke, kidney disease, and other problems. A blood pressure reading consists of a higher number over a lower number. Ideally, your blood pressure should be below 120/80. The first ("top") number is called the systolic pressure. It is a measure of the pressure in your arteries as your heart beats. The second ("bottom") number is called the diastolic pressure. It is a measure of the pressure in your arteries as the heart relaxes. What are the causes? The exact cause of this condition is not known. There are some conditions that result in high blood pressure. What increases the risk? Certain factors may make you more likely to develop high blood pressure. Some of these risk factors are under your control, including: Smoking. Not getting enough exercise or physical activity. Being overweight. Having too much fat, sugar, calories, or salt (sodium) in your diet. Drinking too much alcohol. Other risk factors include: Having a personal history of heart disease, diabetes, high cholesterol, or kidney disease. Stress. Having a family history of high blood pressure and high cholesterol. Having obstructive sleep apnea. Age. The risk increases with age. What are the signs or symptoms? High blood pressure may not cause symptoms. Very high blood pressure (hypertensive crisis) may cause: Headache. Fast or irregular heartbeats (palpitations). Shortness of breath. Nosebleed. Nausea and vomiting. Vision changes. Severe chest pain, dizziness, and seizures. How is this diagnosed? This condition is diagnosed by  measuring your blood pressure while you are seated, with your arm resting on a flat surface, your legs uncrossed, and your feet flat on the floor. The cuff of the blood pressure monitor will be placed directly against the skin of your upper arm at the level of your heart. Blood pressure should be measured at least twice using the same arm. Certain conditions can cause a difference in blood pressure between your right and left arms. If you have a high blood pressure reading during one visit or you have normal blood pressure with other risk factors, you may be asked to: Return on a different day to have your blood pressure checked again. Monitor your blood pressure at home for 1 week or longer. If you are diagnosed with hypertension, you may have other blood or imaging tests to help your health care provider understand your overall risk for other conditions. How is this treated? This condition is treated by making healthy lifestyle changes, such as eating healthy foods, exercising more, and reducing your alcohol intake. You may be referred for counseling on a healthy diet and physical activity. Your health care provider may prescribe medicine if lifestyle changes are not enough to get your blood pressure under control and if: Your systolic blood pressure is above 130. Your diastolic blood pressure is above 80. Your personal target blood pressure may vary depending on your medical conditions, your age, and other factors. Follow these instructions at home: Eating and drinking  Eat a diet that is high in fiber and potassium, and low in sodium, added sugar, and fat. An example of this eating plan is called the DASH diet. DASH stands for Dietary Approaches to Stop Hypertension. To eat this way: Eat  plenty of fresh fruits and vegetables. Try to fill one half of your plate at each meal with fruits and vegetables. Eat whole grains, such as whole-wheat pasta, brown rice, or whole-grain bread. Fill about one  fourth of your plate with whole grains. Eat or drink low-fat dairy products, such as skim milk or low-fat yogurt. Avoid fatty cuts of meat, processed or cured meats, and poultry with skin. Fill about one fourth of your plate with lean proteins, such as fish, chicken without skin, beans, eggs, or tofu. Avoid pre-made and processed foods. These tend to be higher in sodium, added sugar, and fat. Reduce your daily sodium intake. Many people with hypertension should eat less than 1,500 mg of sodium a day. Do not drink alcohol if: Your health care provider tells you not to drink. You are pregnant, may be pregnant, or are planning to become pregnant. If you drink alcohol: Limit how much you have to: 0-1 drink a day for women. 0-2 drinks a day for men. Know how much alcohol is in your drink. In the [place] one drink equals one 12 oz bottle of beer (355 mL), one 5 oz glass of wine (148 mL), or one 1 oz glass of hard liquor (44 mL). Lifestyle  Work with your health care provider to maintain a healthy body weight or to lose weight. Ask what an ideal weight is for you. Get at least 30 minutes of exercise that causes your heart to beat faster (aerobic exercise) most days of the week. Activities may include walking, swimming, or biking. Include exercise to strengthen your muscles (resistance exercise), such as Pilates or lifting weights, as part of your weekly exercise routine. Try to do these types of exercises for 30 minutes at least 3 days a week. Do not use any products that contain nicotine or tobacco. These products include cigarettes, chewing tobacco, and vaping devices, such as e-cigarettes. If you need help quitting, ask your health care provider. Monitor your blood pressure at home as told by your health care provider. Keep all follow-up visits. This is important. Medicines Take over-the-counter and prescription medicines only as told by your health care provider. Follow directions carefully. Blood  pressure medicines must be taken as prescribed. Do not skip doses of blood pressure medicine. Doing this puts you at risk for problems and can make the medicine less effective. Ask your health care provider about side effects or reactions to medicines that you should watch for. Contact a health care provider if you: Think you are having a reaction to a medicine you are taking. Have headaches that keep coming back (recurring). Feel dizzy. Have swelling in your ankles. Have trouble with your vision. Get help right away if you: Develop a severe headache or confusion. Have unusual weakness or numbness. Feel faint. Have severe pain in your chest or abdomen. Vomit repeatedly. Have trouble breathing. These symptoms may be an emergency. Get help right away. Call 911. Do not wait to see if the symptoms will go away. Do not drive yourself to the hospital. Summary Hypertension is when the force of blood pumping through your arteries is too strong. If this condition is not controlled, it may put you at risk for serious complications. Your personal target blood pressure may vary depending on your medical conditions, your age, and other factors. For most people, a normal blood pressure is less than 120/80. Hypertension is treated with lifestyle changes, medicines, or a combination of both. Lifestyle changes include losing weight, eating a healthy,  low-sodium diet, exercising more, and limiting alcohol. This information is not intended to replace advice given to you by your health care provider. Make sure you discuss any questions you have with your health care provider. Document Revised: 03/17/2021 Document Reviewed: 03/17/2021 Elsevier Patient Education  2024 [employer].

## 2024-02-01 DIAGNOSIS — D539 Nutritional anemia, unspecified: Secondary | ICD-10-CM | POA: Insufficient documentation

## 2024-02-07 ENCOUNTER — Ambulatory Visit: Admitting: Internal Medicine

## 2024-02-22 ENCOUNTER — Ambulatory Visit (INDEPENDENT_AMBULATORY_CARE_PROVIDER_SITE_OTHER)

## 2024-02-22 DIAGNOSIS — M810 Age-related osteoporosis without current pathological fracture: Secondary | ICD-10-CM | POA: Diagnosis not present

## 2024-02-22 MED ORDER — DENOSUMAB 60 MG/ML ~~LOC~~ SOSY: 60.0000 mg | PREFILLED_SYRINGE | Freq: Once | SUBCUTANEOUS | Status: AC

## 2024-02-22 NOTE — Progress Notes (Signed)
 Remote PPM Transmission

## 2024-02-22 NOTE — Progress Notes (Signed)
 Pt  here for semi-annual prolia  injection. Pt tolerated injection well.

## 2024-02-26 ENCOUNTER — Other Ambulatory Visit: Payer: Self-pay | Admitting: Internal Medicine

## 2024-03-21 ENCOUNTER — Ambulatory Visit

## 2024-03-21 DIAGNOSIS — I442 Atrioventricular block, complete: Secondary | ICD-10-CM

## 2024-03-22 LAB — CUP PACEART REMOTE DEVICE CHECK
Battery Remaining Longevity: 92 mo
Battery Voltage: 2.99 V
Brady Statistic AP VP Percent: 5 %
Brady Statistic AP VS Percent: 0 %
Brady Statistic AS VP Percent: 94.99 %
Brady Statistic AS VS Percent: 0.01 %
Brady Statistic RA Percent Paced: 4.98 %
Brady Statistic RV Percent Paced: 99.99 %
Date Time Interrogation Session: 20251029050628
Implantable Lead Connection Status: 753985
Implantable Lead Connection Status: 753985
Implantable Lead Implant Date: 20210720
Implantable Lead Implant Date: 20210720
Implantable Lead Location: 753859
Implantable Lead Location: 753860
Implantable Lead Model: 3830
Implantable Lead Model: 5076
Implantable Pulse Generator Implant Date: 20210720
Lead Channel Impedance Value: 285 Ohm
Lead Channel Impedance Value: 361 Ohm
Lead Channel Impedance Value: 361 Ohm
Lead Channel Impedance Value: 551 Ohm
Lead Channel Pacing Threshold Amplitude: 0.375 V
Lead Channel Pacing Threshold Amplitude: 1 V
Lead Channel Pacing Threshold Pulse Width: 0.4 ms
Lead Channel Pacing Threshold Pulse Width: 0.4 ms
Lead Channel Sensing Intrinsic Amplitude: 2.375 mV
Lead Channel Sensing Intrinsic Amplitude: 2.375 mV
Lead Channel Sensing Intrinsic Amplitude: 9 mV
Lead Channel Sensing Intrinsic Amplitude: 9 mV
Lead Channel Setting Pacing Amplitude: 1.5 V
Lead Channel Setting Pacing Amplitude: 2.5 V
Lead Channel Setting Pacing Pulse Width: 0.4 ms
Lead Channel Setting Sensing Sensitivity: 2 mV
Zone Setting Status: 755011
Zone Setting Status: 755011

## 2024-03-28 NOTE — Progress Notes (Signed)
 Remote PPM Transmission

## 2024-03-29 ENCOUNTER — Ambulatory Visit: Payer: Self-pay | Admitting: Internal Medicine

## 2024-04-06 DIAGNOSIS — I10 Essential (primary) hypertension: Secondary | ICD-10-CM | POA: Diagnosis not present

## 2024-04-06 DIAGNOSIS — E785 Hyperlipidemia, unspecified: Secondary | ICD-10-CM | POA: Diagnosis not present

## 2024-04-06 DIAGNOSIS — M1711 Unilateral primary osteoarthritis, right knee: Secondary | ICD-10-CM | POA: Diagnosis not present

## 2024-04-06 DIAGNOSIS — M810 Age-related osteoporosis without current pathological fracture: Secondary | ICD-10-CM | POA: Diagnosis not present

## 2024-04-06 DIAGNOSIS — Z950 Presence of cardiac pacemaker: Secondary | ICD-10-CM | POA: Diagnosis not present

## 2024-04-09 DIAGNOSIS — I10 Essential (primary) hypertension: Secondary | ICD-10-CM | POA: Diagnosis not present

## 2024-04-09 DIAGNOSIS — E785 Hyperlipidemia, unspecified: Secondary | ICD-10-CM | POA: Diagnosis not present

## 2024-04-17 DIAGNOSIS — M25561 Pain in right knee: Secondary | ICD-10-CM | POA: Diagnosis not present

## 2024-04-17 DIAGNOSIS — M1711 Unilateral primary osteoarthritis, right knee: Secondary | ICD-10-CM | POA: Diagnosis not present

## 2024-04-17 NOTE — Progress Notes (Signed)
 " [facility] [address], [place], [place] [address] Office: [phone], Fax: [phone]          ASSESSMENT AND PLAN   ASSESSMENT Knee pain, unspecified chronicity, unspecified laterality   Assessment & Plan Right knee osteoarthritis with history of meniscectomy Chronic right knee pain with post-traumatic arthritis. Prefers to avoid knee replacement. - Administered steroid injection to right knee. - Advised icing and Tylenol  for pain. - Discussed hyaluronic acid injections if steroids ineffective.    The exam findings and x-rays (if performed) at todays visit were reviewed with the patient. The plan was reviewed in detail with the patient and all questions were answered. The patient verbalized understanding and was instructed to call if any problems or concerns.   Follow up 12 weeks or sooner if symptoms worsen or persist  HISTORY OF PRESENT ILLNESS    History of Present Illness Jessica Woodward is an 83 year old female with a history of knee surgery who presents with worsening knee pain.  She has experienced knee pain for several years, which has worsened over the past few months. The pain is primarily located medially and is exacerbated by walking extended distances. She describes the pain as intermittent, stating 'I'll go along for a while and it's fine. Then all of a sudden, it's like, hello.'  She has a history of a knee scope with meniscal involvement. She received a cortisone injection in June, which provided mild relief for about two months. A previous cortisone injection had lasted eight to nine years. She was supposed to follow up in July but moved to [place], where she currently resides.  No pain when going up and down steps. The pain is more pronounced during weight-bearing activities. She engages in low-impact aerobics and walking but is limited by the knee pain, stating 'I don't like to walk too far from home.'  Her past medical  history includes arthritis and a pacemaker. No history of diabetes, heart attack, stroke, or use of blood-thinning medications. No skin infections like MRSA.  She moved to [place] about two months ago to be closer to her son after her husband passed away five years ago. She has been trying to relocate since then as she had no family left in her previous location.     MEDICAL HISTORY  Past Medical History[1]  Problem List[2] Past Surgical History[3]  Social History[4]  Family History[5]  Allergies[6] Current Outpatient Medications  Medication Instructions   atorvastatin  (LIPITOR) 40 mg, Daily (standard)   [START ON 08/22/2024] denosumab  (PROLIA ) 60 mg, Subcutaneous, Every 6 months, Last one on 02/22/24   irbesartan  (AVAPRO ) 300 mg, Daily (standard)   loratadine (CLARITIN) 10 mg, Daily PRN   multivitamin (TAB-A-VITE/THERAGRAN) per tablet 1 tablet, Oral, Daily (standard)   triamcinolone  (KENALOG ) 0.1 % cream 2 times a day (standard)     Reviewed: I have reviewed the following and agree: chief complaint, ROS, past family and social history   VITAL SIGNS There were no vitals taken for this visit.   PAIN SCALE     PHYSICAL EXAM    General Exam Appearance: Well appearing, in no acute distress Respiratory: No evidence of respiratory distress, no retractions, no excessive use of intercostal or accessory muscles Orientation: Patient is awake alert and oriented, normal mood and affect Vascular: 2+ Distal Pulses. No evidence of distal extremity edema in the unaffected extremities Gait: Normal Coordination and Balance.  Physical Exam MUSCULOSKELETAL: Knee examination reveals no abnormalities. No  tenderness on palpation. Normal strength and range of motion.  Left Knee Exam Inspection: Skin warm, dry and well perfused. No wounds, rashes, or ulcers. Normal alignment Palpation: Mild medial joint line tenderness no lateral joint line tenderness.  ROM: 0 - 120 degrees.  Strength: 5/5  strength to resisted flexion. 5/5 strength to resisted extension. Normal tone and bulk without atrophy Vascular: 2+ distal pulses, capillary refill < 2 seconds Neurologic: Sensation intact to light touch about the knee.   Special testing Knee: Effusion: No effusion appreciated Patellofemoral: Normal tracking without crepitus. Negative grind test. No patellar apprehension. Varus/ Valgus stability: Stable at 0 and 30 degrees to varus and valgus stress Meniscus: McMurrays test negative. Thessaly's test negative.  Negative Homans Sign, No Sign of DVT    DIAGNOSTIC RESULTS/IMAGING    Today's Results/ Imaging No results found.  Outside Results/ Imaging independently reviewed: NA   PROCEDURES    CORTISONE INJECTION OF RIGHT KNEE   The patient requested and I recommended a right knee steroid injection today. Risks, benefits, alternatives reviewed. The patient expressed understanding and wishes to proceed. Verbal consent, time out performed, universal precautions were followed. No signs of skin infection surrounding the knee. The skin was prepped with ChloraPrep. The knee was entered through the lateral portal. Injection easily entered the joint.    Injection with 1cc 0.5%, 5mg /mL(bupivacaine HCl) and 1 cc of 40 mg/mL Kenalog  (triamcinolone  acetonide) . Sterile technique and universal protocol utilized. No complications. The patient may have a slight increase in rebound pain when the anesthetic wears off; ice, NSAIDs, rest and elevation may be helpful. Call if there are any concerns    Reviewed: Medical records, diagnostic studies and radiographic report/ images reviewed.        PATIENT INFORMATION Jessica Woodward  09-Mar-1941 [id number] [address] [place] [address]  [phone]    Comments: This note has been dictated using Dragon Narrate software. Although proofread for accuracy, errors may still exist. Such errors do not reflect on the standard of medical care rendered  to this patient.             [1] Past Medical History: Diagnosis Date   Abnormal electrocardiogram (ECG) (EKG) 02/22/2022   Alvan Ronal BRAVO, MD  [phone] 02/26/2022 Routine      Narrative & Impression       The study is normal. The study is low risk.    No ST deviation was noted.    Left ventricular function is normal. End diastolic cavity size is normal.    Prior study not available for comparison.     Small, mild intensity septal defect; worse at rest is likely artifact related to ventricular pacing     Bilateral sensorineural hearing loss 12/27/2016   Breast cancer    (CMS-HCC) 2002   left   DDD (degenerative disc disease), cervical 05/10/2019   Deficiency anemia 02/01/2024   Osteoporosis    Squamous cell cancer of skin of upper arm 1978   right arm  [2] Patient Active Problem List Diagnosis   Benign essential hypertension   Dyslipidemia, goal LDL below 130   Hardening of the aorta (main artery of the heart)   Osteoporosis   Pacemaker  [3] Past Surgical History: Procedure Laterality Date   BREAST LUMPECTOMY     COLONOSCOPY     HYSTERECTOMY     KNEE ARTHROSCOPY Bilateral    pace maker     TONSILLECTOMY    [4] Social History Tobacco Use  Smoking status: Never    Passive exposure: Past   Smokeless tobacco: Never  Substance and Sexual Activity   Alcohol use: Not Currently   Social Drivers of Health   Food Insecurity: No Food Insecurity (01/16/2024)   Received from [facility]   Hunger Vital Sign    Within the past 12 months, you worried that your food would run out before you got the money to buy more.: Never true    Within the past 12 months, the food you bought just didn't last and you didn't have money to get more.: Never true  Tobacco Use: Low Risk (04/17/2024)   Patient History    Smoking Tobacco Use: Never    Smokeless Tobacco Use: Never    Passive Exposure: Past  Recent Concern: Tobacco Use - Medium Risk (01/31/2024)    Received from [facility]   Patient History    Smoking Tobacco Use: Former    Smokeless Tobacco Use: Never  Transportation Needs: No Transportation Needs (01/16/2024)   Received from [facility] - Transportation    In the past 12 months, has lack of transportation kept you from medical appointments or from getting medications?: No    In the past 12 months, has lack of transportation kept you from meetings, work, or from getting things needed for daily living?: No  Physical Activity: Sufficiently Active (07/05/2023)   Received from [facility]   Exercise Vital Sign    On average, how many days per week do you engage in moderate to strenuous exercise (like a brisk walk)?: 5 days    On average, how many minutes do you engage in exercise at this level?: 60 min  Utilities: Not At Risk (01/16/2024)   Received from [facility] Utilities    In the past 12 months has the electric, gas, oil, or water company threatened to shut off services in your home?: No  Stress: No Stress Concern Present (07/05/2023)   Received from [facility] - Occupational Stress Questionnaire    Feeling of Stress : Not at all  Social Connections: Moderately Integrated (01/14/2024)   Received from [facility]   Social Connection and Isolation Panel    In a typical week, how many times do you talk on the phone with family, friends, or neighbors?: More than three times a week    How often do you get together with friends or relatives?: More than three times a week    How often do you attend church or religious services?: More than 4 times per year    Do you belong to any clubs or organizations such as church groups, unions, fraternal or athletic groups, or school groups?: Yes    How often do you attend meetings of the clubs or organizations you belong to?: More than 4 times per year    Are you married, widowed, divorced, separated, never married, or living  with a partner?: Widowed  Physicist, Medical Strain: Low Risk  (07/05/2023)   Received from [facility]   Overall Financial Resource Strain (CARDIA)    Difficulty of Paying Living Expenses: Not hard at all  Health Literacy: Adequate Health Literacy (07/05/2023)   Received from [facility]   B1300 Health Literacy    Frequency of need for help with medical instructions: Never  [5] Family History Problem Relation Age of Onset   Heart disease Mother    Heart disease Father    Pancreatic  cancer Brother   [6] Allergies Allergen Reactions   Anastrozole Dermatitis, Other (See Comments) and Rash    Other Reaction(s): Other (See Comments)  Causes muscle weakness  Muscle weakness   Lisinopril Other (See Comments)    Other Reaction(s): Other (See Comments)  cough  Cough   Romosozumab -Aqqg     Other Reaction(s): Other (See Comments)  Severe muscle weakness   Sulfasalazine Hives  "

## 2024-06-20 ENCOUNTER — Ambulatory Visit

## 2024-06-20 DIAGNOSIS — I442 Atrioventricular block, complete: Secondary | ICD-10-CM

## 2024-06-21 ENCOUNTER — Ambulatory Visit: Payer: Self-pay | Admitting: Cardiology

## 2024-06-21 LAB — CUP PACEART REMOTE DEVICE CHECK
Battery Remaining Longevity: 89 mo
Battery Voltage: 2.98 V
Brady Statistic AP VP Percent: 3.93 %
Brady Statistic AP VS Percent: 0 %
Brady Statistic AS VP Percent: 96.06 %
Brady Statistic AS VS Percent: 0.01 %
Brady Statistic RA Percent Paced: 3.91 %
Brady Statistic RV Percent Paced: 99.99 %
Date Time Interrogation Session: 20260128022827
Implantable Lead Connection Status: 753985
Implantable Lead Connection Status: 753985
Implantable Lead Implant Date: 20210720
Implantable Lead Implant Date: 20210720
Implantable Lead Location: 753859
Implantable Lead Location: 753860
Implantable Lead Model: 3830
Implantable Lead Model: 5076
Implantable Pulse Generator Implant Date: 20210720
Lead Channel Impedance Value: 304 Ohm
Lead Channel Impedance Value: 399 Ohm
Lead Channel Impedance Value: 399 Ohm
Lead Channel Impedance Value: 551 Ohm
Lead Channel Pacing Threshold Amplitude: 0.5 V
Lead Channel Pacing Threshold Amplitude: 0.875 V
Lead Channel Pacing Threshold Pulse Width: 0.4 ms
Lead Channel Pacing Threshold Pulse Width: 0.4 ms
Lead Channel Sensing Intrinsic Amplitude: 2.75 mV
Lead Channel Sensing Intrinsic Amplitude: 2.75 mV
Lead Channel Sensing Intrinsic Amplitude: 9 mV
Lead Channel Sensing Intrinsic Amplitude: 9 mV
Lead Channel Setting Pacing Amplitude: 1.5 V
Lead Channel Setting Pacing Amplitude: 2.5 V
Lead Channel Setting Pacing Pulse Width: 0.4 ms
Lead Channel Setting Sensing Sensitivity: 2 mV
Zone Setting Status: 755011
Zone Setting Status: 755011

## 2024-06-28 NOTE — Progress Notes (Signed)
 Remote PPM Transmission

## 2024-07-05 ENCOUNTER — Ambulatory Visit: Payer: Medicare Other

## 2024-09-19 ENCOUNTER — Encounter

## 2024-12-19 ENCOUNTER — Encounter
